# Patient Record
Sex: Female | Born: 1955 | Race: Black or African American | Hispanic: No | Marital: Married | Smoking: Former smoker
Health system: Southern US, Community
[De-identification: ages and names within clinical notes are randomized; demographics above are authoritative.]

## PROBLEM LIST (undated history)

## (undated) DIAGNOSIS — E039 Hypothyroidism, unspecified: Secondary | ICD-10-CM

## (undated) DIAGNOSIS — I495 Sick sinus syndrome: Secondary | ICD-10-CM

## (undated) DIAGNOSIS — D649 Anemia, unspecified: Secondary | ICD-10-CM

## (undated) DIAGNOSIS — I00 Rheumatic fever without heart involvement: Secondary | ICD-10-CM

## (undated) DIAGNOSIS — E785 Hyperlipidemia, unspecified: Secondary | ICD-10-CM

## (undated) DIAGNOSIS — Z950 Presence of cardiac pacemaker: Secondary | ICD-10-CM

## (undated) DIAGNOSIS — H35039 Hypertensive retinopathy, unspecified eye: Secondary | ICD-10-CM

## (undated) DIAGNOSIS — I739 Peripheral vascular disease, unspecified: Secondary | ICD-10-CM

## (undated) DIAGNOSIS — A048 Other specified bacterial intestinal infections: Secondary | ICD-10-CM

## (undated) DIAGNOSIS — I341 Nonrheumatic mitral (valve) prolapse: Secondary | ICD-10-CM

## (undated) DIAGNOSIS — I10 Essential (primary) hypertension: Secondary | ICD-10-CM

## (undated) DIAGNOSIS — R0989 Other specified symptoms and signs involving the circulatory and respiratory systems: Secondary | ICD-10-CM

## (undated) DIAGNOSIS — I509 Heart failure, unspecified: Secondary | ICD-10-CM

## (undated) DIAGNOSIS — H269 Unspecified cataract: Secondary | ICD-10-CM

## (undated) DIAGNOSIS — R200 Anesthesia of skin: Secondary | ICD-10-CM

## (undated) DIAGNOSIS — I773 Arterial fibromuscular dysplasia: Secondary | ICD-10-CM

## (undated) DIAGNOSIS — K219 Gastro-esophageal reflux disease without esophagitis: Secondary | ICD-10-CM

## (undated) DIAGNOSIS — M797 Fibromyalgia: Secondary | ICD-10-CM

## (undated) DIAGNOSIS — R7302 Impaired glucose tolerance (oral): Secondary | ICD-10-CM

## (undated) HISTORY — DX: Peripheral vascular disease, unspecified: I73.9

## (undated) HISTORY — DX: Impaired glucose tolerance (oral): R73.02

## (undated) HISTORY — DX: Essential (primary) hypertension: I10

## (undated) HISTORY — DX: Nonrheumatic mitral (valve) prolapse: I34.1

## (undated) HISTORY — PX: CARDIAC CATHETERIZATION: SHX172

## (undated) HISTORY — DX: Fibromyalgia: M79.7

## (undated) HISTORY — PX: OTHER SURGICAL HISTORY: SHX169

## (undated) HISTORY — DX: Hyperlipidemia, unspecified: E78.5

## (undated) HISTORY — DX: Heart failure, unspecified: I50.9

## (undated) HISTORY — DX: Sick sinus syndrome: I49.5

## (undated) HISTORY — PX: APPENDECTOMY: SHX54

## (undated) HISTORY — DX: Anesthesia of skin: R20.0

## (undated) HISTORY — PX: CHOLECYSTECTOMY: SHX55

## (undated) HISTORY — DX: Other specified symptoms and signs involving the circulatory and respiratory systems: R09.89

## (undated) HISTORY — DX: Other specified bacterial intestinal infections: A04.8

## (undated) HISTORY — DX: Gastro-esophageal reflux disease without esophagitis: K21.9

## (undated) HISTORY — DX: Rheumatic fever without heart involvement: I00

## (undated) HISTORY — PX: TONSILLECTOMY: SUR1361

## (undated) HISTORY — DX: Unspecified cataract: H26.9

## (undated) HISTORY — PX: ABDOMINAL HYSTERECTOMY: SHX81

## (undated) HISTORY — DX: Arterial fibromuscular dysplasia: I77.3

## (undated) HISTORY — DX: Hypertensive retinopathy, unspecified eye: H35.039

## (undated) HISTORY — DX: Anemia, unspecified: D64.9

## (undated) NOTE — *Deleted (*Deleted)
[facility] - Clinic Note  04/20/2020     CHIEF COMPLAINT Patient presents for No chief complaint on file.   HISTORY OF PRESENT ILLNESS: Sylvia Anthony is a 80 y.o. female who presents to the clinic today for:   pt is using brimonidine and Cosopt TID OU, no new health concerns  Referring physician: Ignatius Specking, MD [address] [place],  [place] [address]  HISTORICAL INFORMATION:   Selected notes from the MEDICAL RECORD NUMBER Referred by Dr. Earlene Plater for concern of bilateral vitreitis.   CURRENT MEDICATIONS: Current Outpatient Medications (Ophthalmic Drugs)  Medication Sig  . brimonidine (ALPHAGAN) 0.2 % ophthalmic solution Place 1 drop into both eyes 3 (three) times daily. (Patient not taking: Reported on 03/16/2020)  . Bromfenac Sodium (PROLENSA) 0.07 % SOLN Place 1 drop into both eyes 4 (four) times daily.   . dorzolamide-timolol (COSOPT) 22.3-6.8 MG/ML ophthalmic solution Place 1 drop into both eyes 3 (three) times daily.  . prednisoLONE acetate (PRED FORTE) 1 % ophthalmic suspension Place 1 drop into both eyes 4 (four) times daily.  (Patient not taking: Reported on 03/16/2020)  . prednisoLONE acetate (PRED FORTE) 1 % ophthalmic suspension Place 1 drop into both eyes 4 (four) times daily. (Patient not taking: Reported on 03/16/2020)   No current facility-administered medications for this visit. (Ophthalmic Drugs)   Current Outpatient Medications (Other)  Medication Sig  . albuterol (PROVENTIL HFA;VENTOLIN HFA) 108 (90 BASE) MCG/ACT inhaler Inhale 2 puffs into the lungs every 6 (six) hours as needed for wheezing or shortness of breath.  Marland Kitchen amLODipine (NORVASC) 10 MG tablet Take 10 mg by mouth daily.  Marland Kitchen aspirin EC 81 MG tablet Take 1 tablet (81 mg total) by mouth daily.  . cetirizine (ZYRTEC) 10 MG tablet Take 10 mg by mouth at bedtime as needed for allergies.   Marland Kitchen diclofenac (VOLTAREN) 75 MG EC tablet Take 75 mg by mouth daily as needed for mild pain.  Marland Kitchen  levothyroxine (SYNTHROID, LEVOTHROID) 112 MCG tablet Take 112 mcg by mouth daily before breakfast.   . metoprolol succinate (TOPROL-XL) 50 MG 24 hr tablet Take 50 mg by mouth daily.  . nitroGLYCERIN (NITROSTAT) 0.4 MG SL tablet Place 1 tablet (0.4 mg total) under the tongue every 5 (five) minutes as needed for chest pain.  . pantoprazole (PROTONIX) 40 MG tablet Take 1 tablet (40 mg total) by mouth 2 (two) times daily before a meal.  . potassium chloride SA (K-DUR,KLOR-CON) 20 MEQ tablet Take 20 mEq by mouth daily.   . rosuvastatin (CRESTOR) 20 MG tablet Take 20 mg by mouth daily.  . valsartan-hydrochlorothiazide (DIOVAN-HCT) 320-25 MG per tablet Take 1 tablet by mouth daily.     No current facility-administered medications for this visit. (Other)      REVIEW OF SYSTEMS:    ALLERGIES Allergies  Allergen Reactions  . Gabapentin     SWELLING "OUT OF IT" 12/23/16    PAST MEDICAL HISTORY Past Medical History:  Diagnosis Date  . Anemia   . Arm numbness   . Arterial fibromuscular dysplasia (HCC)   . Carotid bruit   . Cataract    Mixed form OU  . CHF (congestive heart failure) (HCC) 1997   pacemaker  . Fibromyalgia   . GERD (gastroesophageal reflux disease)   . Glucose intolerance (impaired glucose tolerance)   . H. pylori infection Sept 2013   Amoxicillin, Biaxin  . Hyperlipidemia   . Hypertension   . Hypertensive retinopathy  OU  . Hypothyroidism   . Mitral valve prolapse   . Peripheral vascular disease (HCC)   . Rheumatic fever    age 71, was at [facility] for 17 weeks  . SSS (sick sinus syndrome) [facility])    Past Surgical History:  Procedure Laterality Date  . ABDOMINAL HYSTERECTOMY     partial  . APPENDECTOMY    . CARDIAC CATHETERIZATION     multiple  . CHOLECYSTECTOMY    . COLONOSCOPY  08/01/2006   3 mm descending colon polyp removed/8 mm sessile ascending colon polyp removed / 3-mm rectal  polyp removed /Rare sigmoid diverticulosis/ Moderate internal  hemorrhoids.Advanced adenoma on colonoscopy in March 2008.  The polyp was anadenomatous polyp with a foci of high-grade dysplasia  . COLONOSCOPY  09/08/2008   SIMPLE ADENOMA/HYPERPLASTIC POLY/Multiple colon polyps (ascending, sigmoid, rectal)  Mild sigmoid colon diverticulosis./ Small internal hemorrhoids  . COLONOSCOPY N/A 12/11/2017   Dr. [name]: diverticulosis, hemorrhoids next surveillance tcs 5 years.   . COLONOSCOPY WITH ESOPHAGOGASTRODUODENOSCOPY (EGD)  Sept. 30, 2013   ZOX:WRUE diverticulosis was noted in the sigmoid colon/The colon was otherwise normal/Small internal hemorrhoids/EGD:The mucosa of the esophagus appeared normal/Non-erosive gastritis (inflammation) was found; multiple bx/The duodenal mucosa showed no abnormalities. +H.pylori gastritis, treated with equivalent of prevpac. SAVARY DILATION  . ESOPHAGEAL DILATION N/A 10/07/2014   Procedure: ESOPHAGEAL DILATION;  Surgeon: West Bali, MD;  Location: [facility] ENDO SUITE;  Service: Endoscopy;  Laterality: N/A;  . ESOPHAGOGASTRODUODENOSCOPY N/A 10/07/2014   Dr. [name]: moderate non-erosive gastritis, no definite stricture, empiric dilation . negative H.pylori   . growth removed from intestine     [facility]-as teenager, done through colonoscopy  . PACEMAKER GENERATOR CHANGE  09/18/2006   generator change by Dr Amil Amen with a MDT Adapta L  . PACEMAKER INSERTION  09/13/1995   for sick sinus syndome and syncope at [facility]  . PPM GENERATOR CHANGEOUT N/A 06/11/2018    Medtronic Azure XT DR MRI SureScan model [id number] (serial number [id number]) pacemaker by Dr Johney Frame  . right Achilles tendon     X 2  . TONSILLECTOMY      FAMILY HISTORY Family History  Problem Relation Age of Onset  . Hyperlipidemia Mother   . Hypertension Mother   . Heart disease Father   . Hypertension Father   . Hyperlipidemia Sister   . Hypertension Sister   . Colon polyps Sister   . Hyperlipidemia Brother   . Hypertension Brother   . Colon polyps Brother    . Colon cancer Neg Hx     SOCIAL HISTORY Social History   Tobacco Use  . Smoking status: Former Smoker    Packs/day: 0.25    Years: 4.00    Pack years: 1.00    Quit date: 10/09/1985    Years since quitting: 34.5  . Smokeless tobacco: Never Used  . Tobacco comment: just in teens  Substance Use Topics  . Alcohol use: No    Alcohol/week: 0.0 standard drinks  . 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 Cystoid macular edema of both eyes  H35.353   3. Retinal edema  H35.81   4. 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 Essential hypertension  I10  6. Hypertensive retinopathy of both eyes  H35.033   7. Combined forms of age-related cataract of both eyes  H25.813   8. Nodule of right lung  R91.1   9. 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 Mild Panuveitis w/ CME OU -- improved / stably resolved tapered off drops  - pt reports 1+ mo history of floaters and decreased vision OU; +photophobia  - saw Dr. Earlene Plater, who noted [facility] cell and started PF q1h  - initial exam here showed +cell/pigment in [facility] and vitreous cavity; mild vitreous haze and +central CME OU  - FA (02.15.21) showed central petaloid hyperfluorescence and hyperfluorescence of the optic disc  - s/p STK OD (05.25.21)  - pt reports history of fibromyalgia, family history of lupus  - initiated uveitis lab work up -- all WNL except +toxo IgG   CBC, CMP   RPR, VDRL, FTA-Abs, MHA   HIV, Lyme, Quant-Gold   Toxoplasma titers   HLA Panel   ANA   ANCA   ACE, Lysozyme   RF   ESR, CRP   CXR  - chest x-ray without TB or pulmonary sarcoidosis, but did reveal a 1.6cm nodular mass in right lung base  - hx of Covid-19 infection ?involvement  - repeat FA (08.23.21) shows interval improvement in perifoveal petaloid leakage OU  - OCT shows OD: stable improvement of  IRF/CME; OS: Stable resolution of CME OS  - discussed findings  - IOP improved today to 22 -- on Cosopt and Brim (likely steroid response)  - completed PF and Prolensa taper   - cont Cosopt BID OU  - brimonidine TID OU  - STK informed consent obtained, signed and scanned, 05.25.21  - see procedure note  - f/u 4-6 weeks -- DFE/OCT, IOP check  5,6. Hypertensive retinopathy OU  - discussed importance of tight BP control  - monitor  7. Mixed form age related cataract OU  - The symptoms of cataract, surgical options, and treatments and risks were discussed with patient.  - discussed diagnosis and progression  - not yet visually significant  - monitor for now  8. 1.6 cm lung nodule of R lung base found on CXR  - discussed with Dr. Sherril Croon as above  - PET imaging done on 3.11.21 -- identifying some metabolic lesions (see report below)  - CT chest performed 4.14.21 -- indicating mild dec in some dimensions (see report below)  - pt saw Dr. Delton Coombes who had originally scheduled a bronchoscopy, but was cancelled following the CT evidence of decreasing size on CT  9. Ocular Hypertension OU  - IOP improved today  - likely steroid response  - reviewed instructions: Cosopt BID OU and brimonidine TID OU   Ophthalmic Meds Ordered this visit:  No orders of the defined types were placed in this encounter.  This document serves as a record of services personally performed by Karie Chimera, MD, PhD. It was created on their behalf by Herby Abraham, COA, an ophthalmic technician. The creation of this record is the provider's dictation and/or activities during the visit.    Electronically signed by: Herby Abraham, COA @TODAY @ 10:10 AM   Abbreviations: M myopia (nearsighted); A astigmatism; H hyperopia (farsighted); P presbyopia; Mrx spectacle prescription;  CTL contact lenses; OD right eye; OS left eye; OU both eyes  XT exotropia; ET esotropia; PEK punctate epithelial keratitis; PEE punctate  epithelial erosions; DES dry eye syndrome; MGD meibomian gland dysfunction; ATs artificial tears; PFAT's preservative free artificial tears;  NSC nuclear sclerotic cataract; PSC posterior subcapsular cataract; ERM epi-retinal membrane; PVD posterior vitreous detachment; RD retinal detachment; DM diabetes mellitus; DR diabetic retinopathy; NPDR non-proliferative diabetic retinopathy; PDR proliferative diabetic retinopathy; CSME clinically significant macular edema; DME diabetic macular edema; dbh dot blot hemorrhages; CWS cotton wool spot; POAG primary open angle glaucoma; C/D cup-to-disc ratio; HVF humphrey visual field; GVF goldmann visual field; OCT optical coherence tomography; IOP intraocular pressure; BRVO Branch retinal vein occlusion; CRVO central retinal vein occlusion; CRAO central retinal artery occlusion; BRAO branch retinal artery occlusion; RT retinal tear; SB scleral buckle; PPV pars plana vitrectomy; VH Vitreous hemorrhage; PRP panretinal laser photocoagulation; IVK intravitreal kenalog; VMT vitreomacular traction; MH Macular hole;  NVD neovascularization of the disc; NVE neovascularization elsewhere; AREDS age related eye disease study; ARMD age related macular degeneration; POAG primary open angle glaucoma; EBMD epithelial/anterior basement membrane dystrophy; ACIOL anterior chamber intraocular lens; IOL intraocular lens; PCIOL posterior chamber intraocular lens; Phaco/IOL phacoemulsification with intraocular lens placement; PRK photorefractive keratectomy; LASIK laser assisted in situ keratomileusis; HTN hypertension; DM diabetes mellitus; COPD chronic obstructive pulmonary disease   IMAGING:  NM PET Image (3.11.21)  FINDINGS: Mediastinal blood pool activity: SUV max 2.5  Liver activity: SUV max 3.5  NECK: No hypermetabolic lymph nodes in the neck.  Incidental CT findings: none  CHEST: Within the RIGHT lower lobe, tight cluster of nodules measures 1.4 x 1.3 cm (image 77/4) has  mild to moderate metabolic activity for size (SUV max equal 2.9).  There is hypermetabolic activity associated the subcarinal lymph node which is normal size at 9 mm but fairly intense metabolic activity SUV max equal 5.0. More mild activity associated with a partially calcified small RIGHT hilar node with SUV max equal 3.8.  Incidental CT findings: none  ABDOMEN/PELVIS: No abnormal hypermetabolic activity within the liver, pancreas, adrenal glands, or spleen. No hypermetabolic lymph nodes in the abdomen or pelvis.  Incidental CT findings: none  SKELETON: No focal hypermetabolic activity to suggest skeletal metastasis.  Incidental CT findings: none  IMPRESSION: 1. Mild-to-moderate metabolic activity associated with irregular RIGHT lobe pulmonary nodule. Hypermetabolic subcarinal lymph node. Findings remain indeterminate for inflammatory nodule and lymphadenopathy versus malignancy. There is evidence of granulomatous disease with calcified RIGHT hilar lymph node. Recommend either short-term CT follow-up (3 months) or tissue sampling. 2. No distant metastatic disease.  CT Chest w/o contrast (4.13.21)  FINDINGS: Cardiovascular: Right-sided dual lead pacer device in place, power pack over the left chest. Leads appear in similar position to the prior exam. Heart size mildly enlarged. No pericardial effusion. The calcified atherosclerotic changes of the thoracic aorta without aneurysmal dilation. Main pulmonary artery is mildly dilated.  Mediastinum/Nodes: Stable mildly enlarged subcarinal and right paratracheal lymph nodes just at or less than a cm. No hilar adenopathy. No axillary adenopathy. Thoracic inlet structures are normal.  Lungs/Pleura: Irregular area in the right lower lobe anteriorly, in total measuring approximately 1.7 cm by 1.2 cm. Grouped nodules at the periphery. The dominant area measuring approximately 1.3 cm greatest dimension with extension to  the pleural surface along the right lateral chest. The lesion abuts the major fissure. On the sagittal and coronal images the lesion appears to have decreased in size compared to the prior study, dominant area measuring 6-7 mm greatest thickness on sagittal image 44 of series 6 as compared to 9-10 mm on the previous study, image 36 of series 7 on the 07/22/2018 exam.  Axial measurements are provided across the greatest extent of the lesion which appears to  be less dense in the axial plane within the central portion of the measured area across grouped nodules at the periphery and the dominant nodule more centrally.  On coronal image 78 of series 5 the lesion measures 1.2 cm greatest dimension, previously approximately 1.2 cm with respect to the largest area but measured 0.9 cm greatest thickness.  Airways are patent. No consolidation or pleural effusion.  Upper Abdomen: Incidentally imaged upper abdominal contents with changes of cholecystectomy. Upper abdominal contents are otherwise unremarkable.  Musculoskeletal: No chest wall lesion. No acute bone process. No destructive bone finding.  IMPRESSION: 1. Irregular area in the right lower lobe with dominant nodule in small group peripheral nodules raises the question of infectious etiology given the slight interval decrease in some dimensions compared to the prior study potential short interval follow-up could be helpful to track for interval decrease. Neoplasm at this point not entirely excluded. 2. Stable mildly enlarged subcarinal and right paratracheal lymph nodes. Below threshold in terms of nodal enlargement for CT. 3. Aortic atherosclerosis.

---

## 1995-05-31 DIAGNOSIS — I509 Heart failure, unspecified: Secondary | ICD-10-CM

## 1995-05-31 HISTORY — DX: Heart failure, unspecified: I50.9

## 1995-09-13 HISTORY — PX: PACEMAKER INSERTION: SHX728

## 2001-05-01 ENCOUNTER — Inpatient Hospital Stay (HOSPITAL_COMMUNITY): Admission: AD | Admit: 2001-05-01 | Discharge: 2001-05-03 | Payer: Self-pay | Admitting: Interventional Cardiology

## 2006-08-01 ENCOUNTER — Encounter (INDEPENDENT_AMBULATORY_CARE_PROVIDER_SITE_OTHER): Payer: Self-pay | Admitting: Specialist

## 2006-08-01 ENCOUNTER — Ambulatory Visit (HOSPITAL_COMMUNITY): Admission: RE | Admit: 2006-08-01 | Discharge: 2006-08-01 | Payer: Self-pay | Admitting: Gastroenterology

## 2006-08-01 ENCOUNTER — Ambulatory Visit: Payer: Self-pay | Admitting: Gastroenterology

## 2006-08-01 HISTORY — PX: COLONOSCOPY: SHX174

## 2006-09-18 ENCOUNTER — Ambulatory Visit (HOSPITAL_COMMUNITY): Admission: RE | Admit: 2006-09-18 | Discharge: 2006-09-18 | Payer: Self-pay | Admitting: Cardiology

## 2006-09-18 HISTORY — PX: PACEMAKER GENERATOR CHANGE: SHX5998

## 2006-12-28 ENCOUNTER — Ambulatory Visit (HOSPITAL_COMMUNITY): Admission: RE | Admit: 2006-12-28 | Discharge: 2006-12-28 | Payer: Self-pay | Admitting: Cardiology

## 2008-07-22 ENCOUNTER — Ambulatory Visit: Payer: Self-pay | Admitting: Gastroenterology

## 2008-08-19 ENCOUNTER — Telehealth: Payer: Self-pay | Admitting: Gastroenterology

## 2008-08-21 ENCOUNTER — Encounter: Payer: Self-pay | Admitting: Gastroenterology

## 2008-09-08 ENCOUNTER — Ambulatory Visit (HOSPITAL_COMMUNITY): Admission: RE | Admit: 2008-09-08 | Discharge: 2008-09-08 | Payer: Self-pay | Admitting: Gastroenterology

## 2008-09-08 ENCOUNTER — Ambulatory Visit: Payer: Self-pay | Admitting: Gastroenterology

## 2008-09-08 ENCOUNTER — Encounter: Payer: Self-pay | Admitting: Gastroenterology

## 2008-09-08 HISTORY — PX: COLONOSCOPY: SHX174

## 2008-09-10 ENCOUNTER — Encounter (INDEPENDENT_AMBULATORY_CARE_PROVIDER_SITE_OTHER): Payer: Self-pay

## 2008-09-12 ENCOUNTER — Telehealth (INDEPENDENT_AMBULATORY_CARE_PROVIDER_SITE_OTHER): Payer: Self-pay

## 2010-10-12 NOTE — Op Note (Signed)
[name], [name] NO.:  [id number]   MEDICAL RECORD NO.:  [id number]          PATIENT TYPE:  OIB   LOCATION:  2899                         FACILITY:  [facility]   PHYSICIAN:  Francisca December, M.D.  DATE OF BIRTH:  06/19/1955   DATE OF PROCEDURE:  12/28/2006  DATE OF DISCHARGE:  12/28/2006                               OPERATIVE REPORT   PROCEDURE PERFORMED:  Exploration of pacemaker pocket.   INDICATION:  Sylvia Anthony is a 55 year old woman who is now 3-1/2  months S/P pacemaker battery change.  Over the last two weeks, she has  developed a painful lateral edge of her incision which had healed  nicely.  A 1 cm x 1/2 cm region of a very tender tissue that is slightly  raised but not erythematous or swollen is seen at the lateral edge of  the wound.  There is a concern for late pacemaker pocket infection or  perhaps a stitch seroma.  She is brought to the catheterization  laboratory for an exploration at this time.   PROCEDURE NOTE:  The patient was brought to cardiac catheterization  laboratory in a fasting state.  The left prepectoral region was prepped  and draped in the usual sterile fashion.  Local anesthesia was obtained  with the infiltration of 1% lidocaine with epinephrine.  The previously  healed pacemaker wound was incised and explored down to the level of the  pacemaker capsule.  The capsule itself was not opened.  Anaerobic and  aerobic cultures were obtained.  There was no apparent seroma or  purulence seen.  The tissue was healing normally.  The wound was then  closed using 2-0 Vicryl for the subcutaneous layer in a running fashion.  The skin was approximated using 4-0 Vicryl in a running subcuticular  fashion.  Steri-Strips and a sterile dressing were applied.  The patient  was transported to the recovery area in stable condition.   POSTOPERATIVE DIAGNOSIS:  Normal wound healing.  Perhaps, keloid  formation causing tenderness.  No clear  evidence of infection.   PLAN:  The patient will be discharged.  Await culture results.      Francisca December, M.D.  Electronically Signed     JHE/MEDQ  D:  12/28/2006  T:  12/29/2006  Job:  [id number]

## 2010-10-12 NOTE — Assessment & Plan Note (Signed)
NAMEMarland Anthony  ZITLALY, MALSON            CHART#:  [id number]   DATE:  07/22/2008                       DOB:  11/01/1955   PRIMARY PHYSICIAN:  Dorise Hiss, MD   PROBLEM LIST:  1. Advanced adenoma on colonoscopy in March 2008.  The polyp was an      adenomatous polyp with a foci of high-grade dysplasia.  2. Hypertension.  3. Hypothyroidism.  4. Hyperlipidemia.  5. Gastroesophageal reflux disease.  6. Mitral valve prolapse/bradycardia requiring pacer placement.  7. Fibromyalgia.  8. Hysterectomy.  9. Cholecystectomy at [facility] (no stones).  10.Appendectomy.   SUBJECTIVE:  The patient is a 55 year old female who was last seen in  March 2008 for her colonoscopy.  She reports dark bowel movements.  She  denies that there were black.  She has not changed her diet or had any  Pepto-Bismol.  She is taking the same vitamins.  She is taking aspirin a  day.  She denies that the use of BCs, Goody powders, ibuprofen, Motrin,  or Aleve.  She is having 1 regular hard stool a day, even when she had  the dark stool.  She eats turnip greens, it usually turns her stools  green.  She denies any abdominal pain, weight loss, nausea, or vomiting.  The last time she saw dark stools is 2 few weeks ago.  She denies any  fever or chills.  She is have no problems swallowing.  She does have  some burping.  Over-the-counter acid reducer helps the heartburn.  Protonix may help as well.  Her appetite is good.  She is not following  a low-fat diet.   FAMILY HISTORY:  Her brother has polyps.  She has no family history of  colon cancer.   SOCIAL HISTORY:  She denies tobacco or alcohol use.   OBJECTIVE:  VITAL SIGNS:  Weight 191 pounds, height 5 feet 4 inches,  temperature 98, blood pressure 130/90, and pulse 60.GENERAL:  She is in  no apparent distress.  Alert and oriented x4.HEENT:  Atraumatic,  normocephalic.  Pupils equal and react to light.  Mouth, no oral  lesions.  Posterior pharynx  without erythema or exudate.  NECK:  Full range of motion.  No lymphadenopathy.LUNGS:  Clear to  auscultation bilaterally.CARDIOVASCULAR:  Regular rhythm.  No murmur.  Normal S1 and S2.  ABDOMEN:  Bowel sounds are present, soft, nontender, nondistended.  No  rebound or guarding.LUNGS:  Clear to auscultation  bilaterally.EXTREMITIES:  No cyanosis and trace edema.NEUROLOGIC:  She  has no focal neurologic deficits.   ASSESSMENT:  The patient is a 55 year old female with dark stools and  not consistent with upper gastrointestinal bleed.  She has a history of  high-grade dysplasia and a polyp.  Thank you for allowing me to see the  patient in consultation.  My recommendations follow.   RECOMMENDATIONS:  1. Screening colonoscopy in March 2010 due to advanced polyp.  2. She should follow a low-fat diet.  She is given a handout on low-      fat diet.  3. She should take Protonix 30 minutes before first meal.  4. She has a follow up to see me as needed.       Kassie Mends, M.D.  Electronically Signed     SM/MEDQ  D:  07/23/2008  T:  07/23/2008  Job:  [id number]   cc:   Dorise Hiss, M.D.

## 2010-10-12 NOTE — Op Note (Signed)
Sylvia Anthony, Sylvia Anthony           ACCOUNT NO.:  [id number]   MEDICAL RECORD NO.:  [id number]          PATIENT TYPE:  AMB   LOCATION:  DAY                           FACILITY:  [facility]   PHYSICIAN:  Kassie Mends, M.D.      DATE OF BIRTH:  1955-09-17   DATE OF PROCEDURE:  09/08/2008  DATE OF DISCHARGE:                               OPERATIVE REPORT   REFERRING PHYSICIAN:  Dorise Hiss, MD   PROCEDURE:  Colonoscopy with cold forceps and snare cautery polypectomy.   INDICATION FOR EXAMINATION:  [name] is a 55 year old female who  had a colonoscopy in 2008.  She had an 8-mm ascending colon polyp  removed according to the notes.  The pathology report and the nursing  notes say descending colon polyp was removed.  The polyp which was  removed had high-grade dysplasia, had an adenomatous polyp with a foci  of high-grade dysplasia.  She presents for continued surveillance.   FINDINGS:  1. A 4-6-mm sessile ascending colon polyp removed via cold forceps.      The polyp was in between the folds there.  2. A 6-mm sessile sigmoid colon polyp removed via snare cautery.  The      polyp was located between a fold.  A 4-mm sessile proximal rectal      polyp removed via snare cautery.  2-3-mm sessile rectal polyps      removed via cold forceps.  The rectal polyps and the ascending      colon polyps were placed in bottle #1.  The sigmoid colon polyp was      placed in bottle #2.  3. Frequent sigmoid colon diverticula.  Otherwise, no evidence of      masses, inflammatory changes, or AVMs.  4. Small internal hemorrhoids.   DIAGNOSES:  1. Multiple colon polyps (ascending, sigmoid, rectal).  2. Mild sigmoid colon diverticulosis.  3. Small internal hemorrhoids.   RECOMMENDATIONS:  1. She should follow a high-fiber diet.  She is given a handout on      high-fiber diet, polyps, diverticulosis, and hemorrhoids.  2. No aspirin, NSAIDs, or anticoagulation for 7 days.  3. Will call her with results  of her biopsies.  If she has evidence of      an advanced adenoma then recommend a screening colonoscopy in 1      year.  Otherwise, she may wait 3 years.  4. All first-degree relatives need a screening colonoscopy at age 46      and then every 5 years.   MEDICATIONS:  1. Demerol 75 mg IV.  2. Versed 3 mg IV.   PROCEDURE TECHNIQUE:  Physical exam was performed.  Informed consent was  obtained from the patient after explaining the benefits, risks, and  alternatives to the procedure.  The patient was connected to the monitor  and placed in left lateral position.  Continuous oxygen was provided by  nasal cannula.  IV medicine administered through an indwelling cannula.  After administration of sedation and rectal exam, the patient's rectum  was intubated.  The scope was advanced under direct visualization to the  cecum.  Scope was removed slowly by carefully examining the color,  texture, anatomy, and integrity of the mucosa on the way out.  The  patient was recovered in endoscopy and discharged home in satisfactory  condition.   PATH:  SIMPLE ADENOMA, HYPERPLASTIC POLYP, INFLAMMED POLYPOID TISSUE.  TCS IN 3 YEARS. HIGH FIBER DIET.      Kassie Mends, M.D.  Electronically Signed     SM/MEDQ  D:  09/08/2008  T:  09/08/2008  Job:  [id number]   cc:   Dorise Hiss, M.D.  Fax: [phone]

## 2010-10-15 NOTE — Cardiovascular Report (Signed)
[place]  Patient:    Sylvia Anthony, Sylvia Anthony Visit Number: [id number] MRN: [id number]          Service Type: MED Location: [phone] Attending Physician:  Lyn Records. Iii Dictated by:   Darci Needle, M.D. Proc. Date: 05/02/01 Admit Date:  05/01/2001   CC:         Dorise Hiss, M.D., [place], [place]   Cardiac Catheterization  INDICATIONS FOR PROCEDURE:  Recently performed Cardiolite study after an emergency admission to [facility] demonstrated possible anterior ischemia.  PROCEDURES PERFORMED: 1. Left heart catheterization. 2. Selective coronary angiography. 3. Left ventriculography.  DESCRIPTION OF PROCEDURE:  After informed consent, a 6-French sheath was inserted into the right femoral artery using the modified Seldinger technique. A 6-French A2 multipurpose catheter was used for hemodynamic recordings, hand-injection left ventriculography, and attempted left and right coronary angiography.  Ultimately, we needed a #4 left Judkins 6-French and a #4 Judkins right 6-French and a 6-French no-torque right catheter to complete the case.  Because of ostial right coronary spasm, 100 mcg of intracoronary nitroglycerin were administered before the final angiogram of the right coronary was performed with the no-torque catheter.  This did not demonstrate any evidence of ostial stenosis.  RESULTS:    I. HEMODYNAMIC DATA:          a. Aortic pressure:  133/82.          b. Left ventricular pressure:  140/19.   II. LEFT VENTRICULOGRAPHY:  The left ventricle is normal in size and in      function.  Contractility is normal.  Ejection fraction is 75%.  III. SELECTIVE CORONARY ANGIOGRAPHY:          a. Left main coronary:  The left main coronary artery contains             perhaps mild ostial narrowing of up to 20%.  There is probably             also a component of spasm.  No significant abnormalities of the             left  main noted.          b. Left anterior descending coronary:  This is a large vessel that             gives origin to a large first diagonal and a moderate sized second             diagonal.  The LAD is essentially normal.  There is mild plaque             noted in the proximal LAD.          c. Ramus branch:  A large bifurcating ramus branch arises from the             left main and is normal.          d. Circumflex artery:  The circumflex artery contains no significant             obstruction.  Three obtuse marginal branches arise from it.          e. Right coronary:  The right coronary artery is large and tortuous.             It gives origin to a PDA and left ventricular branch.  No             significant obstruction is noted.  CONCLUSIONS: 1. Essentially normal coronary arteries. 2. Normal left ventricular function. 3. Abnormal Cardiolite is a false positive study.  RECOMMENDATIONS:  Recover overnight and discharge in the a.m.  No evidence of coronary disease.  No further cardiac evaluation seems indicated. Dictated by:   Darci Needle, M.D. Attending Physician:  Lyn Records. Iii DD:  05/02/01 TD:  05/02/01 Job: [id number]

## 2010-10-15 NOTE — H&P (Signed)
[place]. [facility]  Patient:    Sylvia Anthony, Sylvia Anthony Visit Number: [id number] MRN: [id number]          Service Type: MED Location: [phone] Attending Physician:  Lyn Records. Iii Dictated by:   Anselm Lis, N.P. Admit Date:  05/01/2001   CC:         Dorise Hiss, M.D.  Jonelle Sidle, M.D. [facility], [facility]   History and Physical  PRIMARY CARE Daxon Kyne:  Dorise Hiss, M.D.  IMPRESSION: 1. Constellation of symptoms of diaphoresis, shortness of breath,    palpitations, and chest pain in this 55 year old female, setting of singing    in church choir.  Decreased blood pressure noted in [facility] Emergency    Room, 81/43.  Her labs are significant for a serum potassium of 3.1.  She    does have a history of prior cardiac catheterization in 1995 by Dr. Daisy Floro    revealing normal coronary arteries, mild mitral regurgitation.  She is    status post adenosine Cardiolite at [facility] April 29, 2001, revealing    abnormalities anteroapical and inferobasal area with normal ejection    fraction.  She did rule out for myocardial infarction by negative serial    cardiac enzymes (elevated CK but normal MB fraction, normal troponin I).    The patient has a permanent transvenous pacemaker in place from 1997    (secondary to symptomatic bradycardia) with subsequent Holter monitor    October 2002 for complaint of palpitations/dizziness revealing no    correlation of symptoms with any dysrhythmia.  Her electrocardiogram at    [facility] was without significant change from baseline.  Negative congestive    heart failure on chest x-ray, and she is currently pain-free. 2. Hypothyroidism, supplemented.  Her thyroid stimulating hormone is okay. 3. History of symptomatic bradycardia, for which she had a permanent    transvenous pacemaker placed in 1997 at [facility]    [facility]. 4. Hypokalemia when she  was admitted at [facility] April 29, 2001, of 3.1,    likely related to her hydrochlorothiazide.  Her potassium is currently    3.9 after supplementation.  PLAN:  Patient counseled to undergo and accepted plans for coronary angiography with possible percutaneous intervention if indicated and able. Risks, potential complications, benefits, and alternatives discussed in detail.  Sylvia Anthony has indicated questions and concerns have been addressed and is agreeable to proceed.  HISTORY OF PRESENT ILLNESS:  Sylvia Anthony is a pleasant 55 year old female who was transferred from [facility] May 01, 2001.  She was admitted at [facility] December 1 for complaint of chest discomfort.  She ruled out, and subsequent Cardiolite was suggestive of "abnormalities" anteroapical and inferobasal regoin with normal ejection fraction.  She was transferred here to [facility] in anticipation of cardiac catheterization by Dr. Katrinka Blazing.  Sylvia Anthony was status post a weekend bus trip to [facility] and came back late Saturday evening (April 28, 2001).  The next day she felt well and sang in the church choir that Sunday evening.  About 7:30 that evening felt hot, became diaphoretic, and noted palpitations and shortness of breath.  She developed left arm numbness and noted a sharp substernal chest pain.  She had a syncopal episode approximately five minutes.  When she came to, EMS was summoned and she was transported to [facility].  She noted anterior tightness along with her sharp pain.  The sharp  pain was mostly relieved after a combination of sublingual nitrates and morphine.  She eventually had resolution of her chest tightness the next day by midafternoon, December 2. Her blood pressure upon her initial presentation to the emergency room was 81/43.  Her serum potassium was decreased at 3.4, which was supplemented.  She was treated with IV fluids for hypotension.  She had elevated  serial CK but negative MB fraction and negative troponin I.  A subsequent adenosine Cardiolite revealed anteroapical and inferobasal abnormalities with normal ejection fraction.  She was transferred to [facility] for further evaluation in anticipation of cardiac catheterization.  PAST MEDICAL HISTORY: 1. History of chest pain.    a. 1995, cardiac catheterization by Dr. Daisy Floro revealing no significant       CAD, normal LV function, 1-2+ MR.    b. April 29, 2001, adenosine Cardiolite at [facility]       revealing anteroapical and inferobasal abnormalities, significant for a       normal EF. 2. In 1997, DDDR pacemaker at [facility] for symptomatic    bradycardia from sinus node dysfunction.  She was followed in our clinic.    Pacemaker placed by Dr. Florestine Avers. 3. History of palpitations/dizziness, for which she had Holter monitor    October 2002 revealing no correlation of complaints with any dysrhythmias. 4. History of mild MR; she did have rheumatic fever as a child. 5. History of hypothyroidism resulting from radioactive iodine ablation for    treatment of Graves disease.  She is on Synthroid. 6. Hypertension. 7. GERD. 8. Migraine headaches, for which she takes Imitrex p.r.n. 9. History of fibromyalgia, for which she takes Vicodin p.r.n.  PAST SURGICAL HISTORY: 1. Cholecystectomy. 2. Hysterectomy without BSO. 3. Removal of intestinal growth. 4. Appendectomy. 5. Tonsillectomy. 6. Right heel operation. 7. Pacemaker.  ALLERGIES:  No known drug allergies.  No problems with seafood, known dye any products.  MEDICATIONS: 1. Synthroid 0.05 mg p.o. q.d. 2. Hydrochlorothiazide _____ per day. 3. Enteric-coated aspirin 325 mg per day. 4. Imitrex p.r.n.  SOCIAL HISTORY AND HABITS:  She is on a disability for the last six years. She is married.  She has a son, age 59, who is a Engineer, site.  FAMILY HISTORY:  Father died at age 81 of  a heart attack and stroke.  Mother  is age 38, alive and well.  She has four sisters, one who suffers from seizures.  One of her sisters had a stroke at age 76, another had a stroke at age 10 and is residing in a nursing home.  Two brothers, alive and well.  REVIEW OF SYSTEMS:  As in the HPI and previous medical history.  Otherwise, denies problems with symptoms of GERD.  Has complaints of constipation. Negative diarrhea, no BRBPR.  Negative melena.  Negative dysuria or hematuria. Generalized joint pain and swelling thought secondary to fibromyalgia.  Does have dependent pedal edema.  Denies orthopnea nor PND, though does have a chronic DOE with going down stairs.  PHYSICAL EXAMINATION:  VITAL SIGNS:  Blood pressure 136/63, heart rate 64 and regular, respiratory rate 20, O2 saturation 98% on room air.  GENERAL:  She is an overweight, pleasantly conversant middle-aged female in no apparent distress.  NECK:  Brisk bilateral carotid upstroke without bruit.  Neck has no JVD, no thyromegaly.  CHEST:  Lungs sounds clear except for some bibasilar crackles.  Negative CVA tenderness.  ABDOMEN:   Soft, nontender, normoactive bowel sounds.  Negative abdominal aortic, renal, or femoral bruit.  Nontender to applied pressure.  NO masses. No organomegaly.  EXTREMITIES:  Distal pulses intact.  Negative pedal edema.  GENITAL, RECTAL:  Deferred.  NEUROLOGIC:  Cranial nerves II-XII grossly intact.  Alert and oriented x 3.  LABORATORY DATA:  EKG from December 4 revealed A-paced appropriately, _____ T-wave abnormalities which are nonspecific, seem improved from initial EKG at [facility], but even those EKGs were no significant change from prior EKG March 08, 2001.  Labs from May 01, 2001, revealed WBC 7.2, hemoglobin 12.3, hematocrit 35.8, platelets of 198.  Protime 14.1, INR of 1.1. Sodium 139, potassium 3.9, chloride 110, CO2 30, BUN 7, creatinine  0.8, glucose 97, calcium 8.1.   Chest x-ray from [facility] revealed normal heart size, no infiltrates, no edema.  TSH was okay at 1.34.  Serial cardiac enzymes at [facility] revealed first CK of 445, MB fraction of 0.9, troponin I of 0.01.  Second CK of 308 with MB fraction 1.2.  Third CK 295, MB fraction 1.3, troponin I 0.02.  Cholesterol of 181, HDL 43, LDL of 123. Dictated by:   Anselm Lis, N.P. Attending Physician:  Lyn Records. Iii DD:  05/02/01 TD:  05/02/01 Job: [id number]

## 2010-10-15 NOTE — Discharge Summary (Signed)
[place]. [facility]  Patient:    Sylvia Anthony, Sylvia Anthony Visit Number: [id number] MRN: [id number]          Service Type: MED Location: [phone] Attending Physician:  Lyn Records. Iii Dictated by:   Anselm Lis, N.P. Admit Date:  05/01/2001 Discharge Date: 05/03/2001   CC:         Dorise Hiss, M.D., [place], [place]   Discharge Summary  DATE OF BIRTH:  May 31, 1955  PRIMARY CARE Enrico Eaddy:  Dorise Hiss, M.D., in [place].  PROCEDURES: 1. (April 29, 2001) [facility] had adenosine-Cardiolite    which was suspicious for anterior apical and inferobasal abnormalities with    normal ejection fraction. 2. (May 02, 2001) Coronary angiography and LV gram revealing essentially    normal coronary arteries for age and normal LV function.  DISCHARGE DIAGNOSES: 1. Nausea, diaphoresis, near-syncope question secondary to neurally mediated    syncope/hypotension syndrome. 2. Chest pain with positive (abnormal) Cardiolite.  She ruled out for    myocardial infarction.  Cardiac catheterization was negative for evidence    of coronary artery disease.    a. Abnormal Cardiolite is false-positive.    b. Chest pain probably related to neurally mediated hypotension syndrome. 3. Sick sinus syndrome, prior permanent transvenous pacemaker placed in 1997    at [facility]. 4. Hypokalemia likely secondary to hydrochlorothiazide.  Serum potassium was    3.1 on admission.  This was supplemented.  Subsequent followup potassium    was 3.9.  She was discharged on K-Dur 10 mEq p.o. q.d.  PLAN: 1. The patient discharged home in stable condition. 2. Same medications as prior to admission with the addition of K-Dur for    hypokalemia. 3. Education concerning neurally mediated hypotension syndrome. 4. Discharge medications:    a. Synthroid 0.05 once daily.    b. Hydrochlorothiazide dose as before.    c. Enteric-coated aspirin  325 mg once daily.    d. Imitrex p.r.n. migraines as before.    e. (NEW) K-Dur 10 mEq p.o. q.d. 5. Activity: No heavy lifting or pushing today or tomorrow then as before. 6. Diet: Low salt. 7. Wound care: May shower. 8. Special instructions: The patient is to call our clinic [phone]) if she    develops a large amount of swelling or bruising in the groin area. 9. Followup: Dr. Lucianne Lei as previously scheduled.  HISTORY OF PRESENT ILLNESS:  The patient was a pleasant 55 year old female who presented to [facility] on April 29, 2001 after experiencing symptoms of diaphoresis, shortness of breath, palpitations and chest discomfort after singing in church choir.  She ruled out for MI by negative serial cardiac enzymes in [facility].  Subsequent Adenosine-Cardiolite there was positive for anterior apical and inferobasal abnormalities.  She was transferred to [facility] on May 01, 2001.  The next day she underwent coronary angiography which revealed normal LV function.  Left main had 20% ostial stenosis, question spasm.  LAD with 20% proximal plaque.  The circumflex and RCA were normal.  She recovered well and was discharged home the next day in stable condition.  PREVIOUS MEDICAL HISTORY: 1. History of chest pain:    a. (1995) Cardiac catheterization by Dr. Daisy Floro revealing no significant       CAD, normal LV function, 1-2+ MR.    b. (1997) DDR pacemaker [facility] for symptomatic       bradycardia from sinus node  dysfunction.  She is followed in our clinic.       Her pacemaker was placed by Dr. Florestine Avers. 2. History of palpitations/dizziness for which she had Holter monitor    evaluation October 2002 revealing no correlation of complaints with any    dysrhythmias. 3. History of mild MR; she did have rheumatic fever as a child. 4. History of hypothyroidism resulting from radioactive iodine ablation for    treatment of Graves  disease.  She is on Synthroid. 5. Hypertension. 6. GERD. 7. Migraine headaches for which she takes Imitrex p.r.n. 8. History of fibromyalgia for which she takes Vicodin p.r.n.  PREVIOUS SURGICAL HISTORY:  Cholecystectomy, hysterectomy without BSO, removal of intestinal growth, appendectomy, tonsillectomy, right heel operation, and pacemaker.  LABORATORY TESTS AND DATA:  EKG reveals a paced rhythm.  T wave abnormalities are nonspecific about the same as those compared to EKG October 2002.  WBC 7.2, hemoglobin 12.3, hematocrit of 35.8, platelets of 198.  Prothrombin time 14.1, INR of 1.1.  Sodium 139, potassium on admission was 3.1; supplemented.  Followup potassium 3.9.  Chloride of 110, CO2 of 30, BUN of 7, creatinine 0.8, glucose 97, calcium 8.1.  Chest x-ray from [facility] revealed normal heart size, no infiltrates nor edema.  TSH okay at 1.34.  Serial cardiac enzymes at [facility] revealed first CK of 445, MB fraction 0.9, troponin I 0.01; second CK 308, MB fraction 1.2; third CK of 295, MB fraction 1.3, troponin I of 0.02.  Cholesterol of 181, HDL of 43, LDL of 123.  Total time preparing this discharge greater than 30 minutes, including dictating this discharge summary, filling out and reviewing discharge instructions with the patient and filling out scripts. Dictated by:   Anselm Lis, N.P. Attending Physician:  Lyn Records. Iii DD:  05/03/01 TD:  05/03/01 Job: [id number]

## 2010-10-15 NOTE — Op Note (Signed)
NAMEDEVONE, BONILLA NO.:  [id number]   MEDICAL RECORD NO.:  [id number]          PATIENT TYPE:  OIB   LOCATION:  2899                         FACILITY:  [facility]   PHYSICIAN:  Francisca December, M.D.  DATE OF BIRTH:  02-19-1956   DATE OF PROCEDURE:  09/18/2006  DATE OF DISCHARGE:  09/18/2006                               OPERATIVE REPORT   PROCEDURE PERFORMED:  1. Explant old pacing generator.  2. Replace new pacing generator, dual-chamber.   INDICATION:  A 55 year old woman who is now 11 years s/p PTVP for sick  sinus syndrome has reached elective replacement interval.  She is  brought to catheterization laboratory at this time to replace the  battery.   PROCEDURE NOTE:  The patient brought to cardiac catheterization  laboratory in fasting state.  The left prepectoral region was prepped  and draped in the usual sterile fashion.  Local anesthesia was obtained  infiltration of 1% lidocaine with epinephrine throughout the left  prepectoral region over the previous pacemaker insertion site.  A 6 to 7  cm incision was then made over the greater previous pacemaker scar and  this was carried down by sharp dissection to the pre pacemaker fascia.  There, capsule was incised and the pacemaker was subsequently delivered  without excessive difficulty.  The wires were then detached from the  pacing generator.  Each pacing wire was tested for adequate pacing  parameters and this is reported below.  The capsule and pacemaker site  was then copiously irrigated using 1% kanamycin solution.  The pacing  leads were then attached to the pacing generator carefully identifying  each by its serial number and placing each into the appropriate  receptacle.  Each lead was tightened into place and tested for security.  The pacemaker was then placed back within the pacemaker capsule.  The  wound was then closed using 2-0 Vicryl running fashion for subcutaneous  layer.  The skin was  approximated using 2-0 Vicryl running subcuticular  fashion.  Steri-Strips and sterile dressing were applied.  The patient  is transported to the recovery area in stable condition.   EQUIPMENT DATA:  The new pacing generator is a Medtronic Adapta model  number ADDRL1, serial number [id number].  The atrial lead detected a 2.1  mV P-wave.  The pacing threshold was 0.5 volts at 0.5 milliseconds pulse  width.  The impedance was 775 ohms resulting in a current at capture  threshold of 0.8 MA.  The ventricular lead detected a 25.6 mV R wave.  The pacing threshold was 0.6 volts of 0.5 milliseconds pulse width.  The  impedance was 936 ohms resulting in a current at capture threshold of  1.2 MA.      Francisca December, M.D.  Electronically Signed     JHE/MEDQ  D:  09/18/2006  T:  09/19/2006  Job:  [id number]

## 2010-10-15 NOTE — Op Note (Signed)
NAME:  Sylvia Anthony, AZIMI NO.:  [id number]   MEDICAL RECORD NO.:  [id number]          PATIENT TYPE:  AMB   LOCATION:  DAY                           FACILITY:  [facility]   PHYSICIAN:  Kassie Mends, M.D.      DATE OF BIRTH:  [birth year]   DATE OF PROCEDURE:  08/01/2006  DATE OF DISCHARGE:                               OPERATIVE REPORT   REFERRING PHYSICIAN:  Dorise Hiss, M.D.   PROCEDURE:  Colonoscopy with snare polypectomy and cold forceps  polypectomy.   INDICATION FOR EXAM:  [name] is a 55 year old female who presents  for average risk colon cancer screening.   FINDINGS:  1. 3 mm descending colon polyp removed via cold forceps.  8 mm sessile      ascending colon polyp removed via snare cautery.  A 3-mm rectal      polyp removed via cold forceps.  Rare sigmoid diverticulosis.      Moderate internal hemorrhoids.  Otherwise no masses, inflammatory      changes or arteriovenous malformations seen.   RECOMMENDATIONS:  1. Will call [name] with biopsy results.  If polyps are      adenomatous, then her siblings and children need a screening      colonoscopy at age 67 and then every 5 years.  2. High fiber diet.  A high-fiber diet is associated with decrease      risk colon cancer and polyp formation.  She is given a handout on      diverticulosis, polyps, high-fiber diet and hemorrhoids.  3. No aspirin, anti-inflammatory drugs or anticoagulation for 7 days.  4. Follow with Dr. Lucianne Lei.   MEDICATIONS:  1. Demerol 75 mg IV.  2. Versed 4 mg IV.   PROCEDURE TECHNIQUE:  Physical exam was performed and informed consent  was obtained from the patient after explaining the benefits, risks and  alternatives to the procedure.  The patient was connected to the monitor  placed in left lateral position.  Continuous oxygen was provided by  nasal cannula and IV medicine administered through an indwelling  cannula.  After administration of sedation and rectal exam,  the  patient's rectum was intubated.  The scope was advanced under direct visualization to the cecum.  The  scope was subsequently removed slowly by carefully examining the color,  texture, anatomy and integrity of the mucosa on the way out.  The  patient was recovered in endoscopy suite and discharged home in  satisfactory condition.      Kassie Mends, M.D.  Electronically Signed     SM/MEDQ  D:  08/01/2006  T:  08/01/2006  Job:  [id number]   cc:   Dorise Hiss, M.D.  Fax: [phone]

## 2011-03-14 LAB — ANAEROBIC CULTURE

## 2011-03-14 LAB — WOUND CULTURE: Gram Stain: NONE SEEN

## 2011-04-27 ENCOUNTER — Encounter: Payer: Self-pay | Admitting: Vascular Surgery

## 2011-04-29 ENCOUNTER — Other Ambulatory Visit: Payer: Self-pay

## 2011-04-29 DIAGNOSIS — I6529 Occlusion and stenosis of unspecified carotid artery: Secondary | ICD-10-CM

## 2011-05-17 ENCOUNTER — Encounter: Payer: Self-pay | Admitting: Vascular Surgery

## 2011-05-18 ENCOUNTER — Encounter: Payer: Self-pay | Admitting: Vascular Surgery

## 2011-05-18 ENCOUNTER — Ambulatory Visit (INDEPENDENT_AMBULATORY_CARE_PROVIDER_SITE_OTHER): Payer: Medicare Other | Admitting: Vascular Surgery

## 2011-05-18 ENCOUNTER — Other Ambulatory Visit (INDEPENDENT_AMBULATORY_CARE_PROVIDER_SITE_OTHER): Payer: Medicare Other | Admitting: *Deleted

## 2011-05-18 VITALS — BP 154/94 | HR 61 | Resp 18 | Ht 64.0 in | Wt 192.0 lb

## 2011-05-18 DIAGNOSIS — I6529 Occlusion and stenosis of unspecified carotid artery: Secondary | ICD-10-CM

## 2011-05-18 NOTE — Progress Notes (Signed)
[facility] [place]  Patient name: Sylvia Anthony MRN: [id number] DOB: 1955-07-15 Sex: female  CC: evaluate carotid artery stenosis. Referred by Dr.Vyas.  HPI: Sylvia Anthony is a 55 y.o. female who has had some repeated episodes of weakness and paresthesias in her left hand. She's also had some pain on the left side of her neck and shoulder. She does describe one episode where she had some expressive aphasia. This prompted a duplex scan that was done at an outlying facility which suggested a 50-69% left carotid stenosis with a less than 50% right carotid stenosis. For this reason she was sent for vascular consultation.  She denies any previous history of stroke, TIAs, expressive or receptive aphasia. His had no history of amaurosis fugax.  Past Medical History  Diagnosis Date  . Arm numbness   . Hypertension   . Pedal edema   . Neck pain   . Hyperlipidemia   . Thyroid disease   . Carotid bruit   . Anemia   . Fibromyalgia   . Arterial fibromuscular dysplasia   . Heart murmur   . Diabetes mellitus     border line  . Peripheral vascular disease   . CHF (congestive heart failure) 1997    pacemaker    Family History  Problem Relation Age of Onset  . Hyperlipidemia Mother   . Hypertension Mother   . Heart disease Father   . Hypertension Father   . Hyperlipidemia Sister   . Hypertension Sister   . Hyperlipidemia Brother   . Hypertension Brother    SOCIAL HISTORY: History  Substance Use Topics  . Smoking status: Never Smoker   . Smokeless tobacco: Not on file  . Alcohol Use: No   No Known Allergies  Current Outpatient Prescriptions  Medication Sig Dispense Refill  . calcium carbonate (TUMS - DOSED IN MG ELEMENTAL CALCIUM) 500 MG chewable tablet Chew 1 tablet by mouth daily.        . cetirizine (ZYRTEC) 10 MG tablet Take 10 mg by mouth daily.        . hydrocodone-acetaminophen (LORCET-HD) 5-500 MG per capsule Take 1 capsule by mouth every  6 (six) hours as needed.        Marland Kitchen levothyroxine (SYNTHROID, LEVOTHROID) 100 MCG tablet Take 100 mcg by mouth daily.        . nitroGLYCERIN (NITROSTAT) 0.3 MG SL tablet Place 0.3 mg under the tongue every 5 (five) minutes as needed.        . potassium chloride SA (K-DUR,KLOR-CON) 20 MEQ tablet Take 20 mEq by mouth 2 (two) times daily.        . Red Yeast Rice Extract (RED YEAST RICE PO) Take 1 tablet by mouth daily.        . valsartan-hydrochlorothiazide (DIOVAN-HCT) 320-25 MG per tablet Take 1 tablet by mouth daily.         REVIEW OF SYSTEMS: Arly.Keller ] denotes positive finding; [  ] denotes negative finding CARDIOVASCULAR:  [ ]  chest pain   [ ]  chest pressure   [ ]  palpitations   [ ]  orthopnea   [ ]  dyspnea on exertion   Arly.Keller ] claudication   [ ]  rest pain   [ ]  DVT   [ ]  phlebitis PULMONARY:   [ ]  productive cough   [ ]  asthma   [ ]  wheezing NEUROLOGIC:   Arly.Keller ] weakness- left arm  Arly.Keller ] paresthesias- left arm  Arly.Keller ] aphasia  [ ]   amaurosis  Arly.Keller ] dizziness HEMATOLOGIC:   [ ]  bleeding problems   [ ]  clotting disorders MUSCULOSKELETAL:  [ ]  joint pain   [ ]  joint swelling Arly.Keller ] leg swelling GASTROINTESTINAL: [ ]   blood in stool  [ ]   hematemesis GENITOURINARY:  [ ]   dysuria  [ ]   hematuria PSYCHIATRIC:  [ ]  history of major depression INTEGUMENTARY:  [ ]  rashes  [ ]  ulcers CONSTITUTIONAL:  [ ]  fever   [ ]  chills  PHYSICAL EXAM: Filed Vitals:   05/18/11 1448 05/18/11 1506  BP: 154/105 154/94  Pulse: 80 61  Resp: 18   Height: 5\' 4"  (1.626 m)   Weight: 192 lb (87.091 kg)   SpO2: 99% 97%   Body mass index is 32.96 kg/(m^2). GENERAL: The patient is a well-nourished female, in no acute distress. The vital signs are documented above. CARDIOVASCULAR: There is a regular rate and rhythm without significant murmur appreciated. I do not detect any carotid bruits. PULMONARY: There is good air exchange bilaterally without wheezing or rales. ABDOMEN: Soft and non-tender with normal pitched bowel sounds.    MUSCULOSKELETAL: There are no major deformities or cyanosis. NEUROLOGIC: No focal weakness or paresthesias are detected. SKIN: There are no ulcers or rashes noted. PSYCHIATRIC: The patient has a normal affect.  DATA:  1. Either reviewed the ultrasound from the referring institution. Although this was interpreted as showing a 50-69% left carotid stenosis by velocity criteria I would say that the left carotid stenosis is closer to 40% range. There was no evidence of significant stenosis on the right.  2. We repeated the carotid duplex scan in our office today. I independently interpreted this and this did not show any evidence of hemodynamically significant stenosis on either side. There is a less than 39% stenosis bilaterally. Vertebral arteries were patent with normally directed flow.  MEDICAL ISSUES: Given that she's having neck pain on the left than her symptoms in the left arm I think most likely her symptoms are related to potentially cervical disc disease. She did have one episode of what sounds like expressive aphasia. If she had repeated episodes which suggested a left hemispheric event then consideration could be given to cerebral arteriography. However, he been a slight risk of stroke associated with this procedure at this point I think the risk is not justified. Her carotid duplex scan essentially looked normal in our office today. Have ordered a follow up duplex scan in 1 year now see her back at that time. She knows to call sooner if she has problems.  Dorsey Authement S [facility] [place] Office: [phone]

## 2011-06-01 NOTE — Procedures (Unsigned)
CAROTID DUPLEX EXAM  INDICATION:  Carotid stenosis.  HISTORY: Diabetes:  Borderline. Cardiac:  Yes. Hypertension:  Yes. Smoking:  Quit in 1987. Previous Surgery:  No. CV History: Amaurosis Fugax No, Paresthesias No, Hemiparesis No.                                      RIGHT             LEFT Brachial systolic pressure:         130               133 Brachial Doppler waveforms:         Normal            Normal Vertebral direction of flow:        Antegrade         Antegrade DUPLEX VELOCITIES (cm/sec) CCA peak systolic                   80                81 ECA peak systolic                   84                158 ICA peak systolic                   80                67 ICA end diastolic                   32                32 PLAQUE MORPHOLOGY:                  Heterogenous      Heterogenous PLAQUE AMOUNT:                      Minimal           Minimal PLAQUE LOCATION:                    CCA, ICA          CCA, bifurcation   IMPRESSION: 1. No hemodynamically significant stenosis of the right or left     internal carotid artery. 2. Antegrade vertebral arteries bilaterally.       ___________________________________________ Di Kindle. Edilia Bo, M.D.  EM/MEDQ  D:  05/18/2011  T:  05/18/2011  Job:  [id number]

## 2011-06-14 ENCOUNTER — Encounter: Payer: Self-pay | Admitting: Vascular Surgery

## 2011-06-14 ENCOUNTER — Other Ambulatory Visit: Payer: Self-pay

## 2011-08-22 ENCOUNTER — Encounter: Payer: Self-pay | Admitting: Gastroenterology

## 2012-01-27 ENCOUNTER — Encounter: Payer: Self-pay | Admitting: Gastroenterology

## 2012-01-29 DIAGNOSIS — A048 Other specified bacterial intestinal infections: Secondary | ICD-10-CM

## 2012-01-29 HISTORY — DX: Other specified bacterial intestinal infections: A04.8

## 2012-01-31 ENCOUNTER — Encounter: Payer: Self-pay | Admitting: Gastroenterology

## 2012-01-31 ENCOUNTER — Ambulatory Visit (INDEPENDENT_AMBULATORY_CARE_PROVIDER_SITE_OTHER): Payer: Medicare Other | Admitting: Gastroenterology

## 2012-01-31 VITALS — BP 132/82 | HR 69 | Temp 97.5°F | Ht 64.5 in | Wt 194.6 lb

## 2012-01-31 DIAGNOSIS — R1314 Dysphagia, pharyngoesophageal phase: Secondary | ICD-10-CM

## 2012-01-31 DIAGNOSIS — Z860101 Personal history of adenomatous and serrated colon polyps: Secondary | ICD-10-CM | POA: Insufficient documentation

## 2012-01-31 DIAGNOSIS — K5900 Constipation, unspecified: Secondary | ICD-10-CM

## 2012-01-31 DIAGNOSIS — K219 Gastro-esophageal reflux disease without esophagitis: Secondary | ICD-10-CM

## 2012-01-31 DIAGNOSIS — R1310 Dysphagia, unspecified: Secondary | ICD-10-CM | POA: Insufficient documentation

## 2012-01-31 DIAGNOSIS — Z86010 Personal history of colonic polyps: Secondary | ICD-10-CM

## 2012-01-31 DIAGNOSIS — R1319 Other dysphagia: Secondary | ICD-10-CM

## 2012-01-31 NOTE — Patient Instructions (Addendum)
We have scheduled you for an upper endoscopy and a colonoscopy with Dr. [name]. Please see separate instructions.

## 2012-01-31 NOTE — Assessment & Plan Note (Signed)
Intermittent, chronic constipation. Infrequent MagCitrate use. Recommend adequate fluid intake daily, high fiber diet.

## 2012-01-31 NOTE — Progress Notes (Signed)
Primary Care Physician:  Darrow Bussing, MD  Primary Gastroenterologist:  Jonette Eva, MD   Chief Complaint  Patient presents with  . Colonoscopy    HPI:  Sylvia Anthony is a 56 y.o. female here to schedule surveillance colonoscopy. She has a history of colonoscopy in 2008 with adenomatous polyp with foci of high-grade dysplasia. Colonoscopy in April 2010, she had numerous colon polyps removed. Pathology revealed simple adenoma and hyperplastic polyps. Dr. [name] recommended she have a followup colonoscopy in 3 years.  H/O chronic constipation. Had "growth" removed as teenager during colonoscopy. May take occasional MagCitrate. No rectal bleeding. No abdominal pain. Heartburn on protonix. On medication for GERD for about four years. Lot of indigestion. Lots of dysphagia with solid foods. No prior EGD. Gel pills stick and come up 30 minutes later. Previously on Nexium, switched due to throat dryness. No n/v. No weight loss. No abdominal pain, melena.  Current Outpatient Prescriptions  Medication Sig Dispense Refill  . aspirin 325 MG tablet Take 325 mg by mouth daily.      . cetirizine (ZYRTEC) 10 MG tablet Take 10 mg by mouth daily.        . hydrocodone-acetaminophen (LORCET-HD) 5-500 MG per capsule Take 1 capsule by mouth every 6 (six) hours as needed.        Marland Kitchen levothyroxine (SYNTHROID, LEVOTHROID) 100 MCG tablet Take 100 mcg by mouth daily.        . nitroGLYCERIN (NITROSTAT) 0.3 MG SL tablet Place 0.3 mg under the tongue every 5 (five) minutes as needed.        . NON FORMULARY Calcium, magnesium, zinc   One tablet daily      . pantoprazole (PROTONIX) 40 MG tablet Take 40 mg by mouth daily.      . potassium chloride SA (K-DUR,KLOR-CON) 20 MEQ tablet Take 20 mEq by mouth 1 day or 1 dose.       . Red Yeast Rice Extract (RED YEAST RICE PO) Take 1 tablet by mouth daily.        . valsartan-hydrochlorothiazide (DIOVAN-HCT) 320-25 MG per tablet Take 1 tablet by mouth daily.           Allergies as of 01/31/2012  . (No Known Allergies)    Past Medical History  Diagnosis Date  . Arm numbness   . Hypertension   . Pedal edema   . Neck pain   . Hyperlipidemia   . Thyroid disease   . Carotid bruit   . Anemia   . Fibromyalgia   . Arterial fibromuscular dysplasia   . Heart murmur   . Diabetes mellitus     border line  . Peripheral vascular disease   . CHF (congestive heart failure) 1997    pacemaker  . GERD (gastroesophageal reflux disease)     Past Surgical History  Procedure Date  . Abdominal hysterectomy     partial  . Appendectomy   . Colonoscopy 08/01/2006    3 mm descending colon polyp removed/8 mm sessile ascending colon polyp removed / 3-mm rectal  polyp removed /Rare sigmoid diverticulosis/ Moderate internal hemorrhoids.Advanced adenoma on colonoscopy in March 2008.  The polyp was anadenomatous polyp with a foci of high-grade dysplasia  . Colonoscopy 09/08/2008    SIMPLE ADENOMA/HYPERPLASTIC POLY/Multiple colon polyps (ascending, sigmoid, rectal)  Mild sigmoid colon diverticulosis./ Small internal hemorrhoids  . Cholecystectomy   . Ventricular cardiac pacemaker insertion     Mitral valve prolapse/bradycardia  . Tonsillectomy   . Right achilles tendon  X 2  . Cardiac catheterization     multiple  . Growth removed from intestine     [facility]-as teenager, done through colonoscopy    Family History  Problem Relation Age of Onset  . Hyperlipidemia Mother   . Hypertension Mother   . Heart disease Father   . Hypertension Father   . Hyperlipidemia Sister   . Hypertension Sister   . Hyperlipidemia Brother   . Hypertension Brother   . Colon polyps Brother   . Colon cancer Neg Hx     History   Social History  . Marital Status: Married    Spouse Name: N/A    Number of Children: 1  . Years of Education: N/A   Occupational History  . part-time hairdresser    Social History Main Topics  . Smoking status: Former Games developer  . Smokeless  tobacco: Not on file   Comment: just in teens  . Alcohol Use: No  . Drug Use: No  . Sexually Active: Not on file   Other Topics Concern  . Not on file   Social History Narrative  . No narrative on file    ROS:  General: Negative for anorexia, weight loss, fever, chills, fatigue, weakness. Eyes: Negative for vision changes.  ENT: Negative for hoarseness, nasal congestion. See history of present illness. CV: Negative for chest pain, angina, palpitations, dyspnea on exertion, peripheral edema.  Respiratory: Negative for dyspnea at rest, dyspnea on exertion, cough, sputum, wheezing.  GI: See history of present illness. GU:  Negative for dysuria, hematuria, urinary incontinence, urinary frequency, nocturnal urination.  MS: Negative for joint pain, low back pain.  Derm: Negative for rash or itching.  Neuro: Negative for weakness, abnormal sensation, seizure, frequent headaches, memory loss, confusion.  Psych: Negative for anxiety, depression, suicidal ideation, hallucinations.  Endo: Negative for unusual weight change.  Heme: Negative for bruising or bleeding. Allergy: Negative for rash or hives.    Physical Examination:  BP 132/82  Pulse 69  Temp 97.5 F (36.4 C) (Temporal)  Ht 5' 4.5" (1.638 m)  Wt 194 lb 9.6 oz (88.27 kg)  BMI 32.89 kg/m2   General: Well-nourished, well-developed in no acute distress.  Head: Normocephalic, atraumatic.   Eyes: Conjunctiva pink, no icterus. Mouth: Oropharyngeal mucosa moist and pink , no lesions erythema or exudate. Neck: Supple without thyromegaly, masses, or lymphadenopathy.  Lungs: Clear to auscultation bilaterally.  Heart: Regular rate and rhythm, no murmurs rubs or gallops.  Abdomen: Bowel sounds are normal, nontender, nondistended, no hepatosplenomegaly or masses, no abdominal bruits or    hernia , no rebound or guarding.   Rectal: deferred to time of colonoscopy Extremities: No lower extremity edema. No clubbing or deformities.   Neuro: Alert and oriented x 4 , grossly normal neurologically.  Skin: Warm and dry, no rash or jaundice.   Psych: Alert and cooperative, normal mood and affect.  Imaging Studies: No results found.

## 2012-01-31 NOTE — Assessment & Plan Note (Signed)
History of GERD, on PPI for about 4 years. Patient complains of refractory indigestion and solid food esophageal dysphagia. Complains of difficulty swallowing gel capsules, often come back up several minutes later. Recommend EGD with esophageal dilation in the near future with Dr. [name].  I have discussed the risks, alternatives, benefits with regards to but not limited to the risk of reaction to medication, bleeding, infection, perforation and the patient is agreeable to proceed. Written consent to be obtained.

## 2012-01-31 NOTE — Progress Notes (Signed)
Faxed to PCP

## 2012-01-31 NOTE — Assessment & Plan Note (Signed)
History of adenomatous colon polyps, previously had foci of high-grade dysplasia. Due for surveillance colonoscopy at this time.  I have discussed the risks, alternatives, benefits with regards to but not limited to the risk of reaction to medication, bleeding, infection, perforation and the patient is agreeable to proceed. Written consent to be obtained.

## 2012-02-01 ENCOUNTER — Other Ambulatory Visit: Payer: Self-pay | Admitting: Gastroenterology

## 2012-02-01 DIAGNOSIS — R1310 Dysphagia, unspecified: Secondary | ICD-10-CM

## 2012-02-01 DIAGNOSIS — Z86010 Personal history of colonic polyps: Secondary | ICD-10-CM

## 2012-02-01 DIAGNOSIS — K5900 Constipation, unspecified: Secondary | ICD-10-CM

## 2012-02-01 MED ORDER — PEG-KCL-NACL-NASULF-NA ASC-C 100 G PO SOLR: 1.0000 | ORAL | Status: DC

## 2012-02-21 ENCOUNTER — Encounter (HOSPITAL_COMMUNITY): Payer: Self-pay | Admitting: Pharmacy Technician

## 2012-02-27 ENCOUNTER — Encounter (HOSPITAL_COMMUNITY): Payer: Self-pay | Admitting: *Deleted

## 2012-02-27 ENCOUNTER — Ambulatory Visit (HOSPITAL_COMMUNITY)
Admission: RE | Admit: 2012-02-27 | Discharge: 2012-02-27 | Disposition: A | Discharge status: Home / Self Care | Payer: BC Managed Care – PPO | Source: Ambulatory Visit | Attending: Gastroenterology | Admitting: Gastroenterology

## 2012-02-27 ENCOUNTER — Encounter (HOSPITAL_COMMUNITY)
Admission: RE | Disposition: A | Discharge status: Home / Self Care | Payer: Self-pay | Source: Ambulatory Visit | Attending: Gastroenterology

## 2012-02-27 DIAGNOSIS — K5900 Constipation, unspecified: Secondary | ICD-10-CM

## 2012-02-27 DIAGNOSIS — A048 Other specified bacterial intestinal infections: Secondary | ICD-10-CM | POA: Insufficient documentation

## 2012-02-27 DIAGNOSIS — Z86010 Personal history of colonic polyps: Secondary | ICD-10-CM

## 2012-02-27 DIAGNOSIS — K648 Other hemorrhoids: Secondary | ICD-10-CM | POA: Insufficient documentation

## 2012-02-27 DIAGNOSIS — K2970 Gastritis, unspecified, without bleeding: Secondary | ICD-10-CM

## 2012-02-27 DIAGNOSIS — R1310 Dysphagia, unspecified: Secondary | ICD-10-CM | POA: Insufficient documentation

## 2012-02-27 DIAGNOSIS — E119 Type 2 diabetes mellitus without complications: Secondary | ICD-10-CM | POA: Insufficient documentation

## 2012-02-27 DIAGNOSIS — K5730 Diverticulosis of large intestine without perforation or abscess without bleeding: Secondary | ICD-10-CM

## 2012-02-27 DIAGNOSIS — I10 Essential (primary) hypertension: Secondary | ICD-10-CM | POA: Insufficient documentation

## 2012-02-27 DIAGNOSIS — E785 Hyperlipidemia, unspecified: Secondary | ICD-10-CM | POA: Insufficient documentation

## 2012-02-27 DIAGNOSIS — K2990 Gastroduodenitis, unspecified, without bleeding: Secondary | ICD-10-CM

## 2012-02-27 DIAGNOSIS — Z1211 Encounter for screening for malignant neoplasm of colon: Secondary | ICD-10-CM | POA: Insufficient documentation

## 2012-02-27 DIAGNOSIS — K2940 Chronic atrophic gastritis without bleeding: Secondary | ICD-10-CM | POA: Insufficient documentation

## 2012-02-27 HISTORY — DX: Hypothyroidism, unspecified: E03.9

## 2012-02-27 HISTORY — PX: COLONOSCOPY WITH ESOPHAGOGASTRODUODENOSCOPY (EGD): SHX5779

## 2012-02-27 SURGERY — COLONOSCOPY, ESOPHAGOGASTRODUODENOSCOPY (EGD) AND ESOPHAGEAL DILATION (ED)
Anesthesia: Moderate Sedation

## 2012-02-27 MED ORDER — MEPERIDINE HCL 100 MG/ML IJ SOLN
INTRAMUSCULAR | Status: DC | PRN
Start: 2012-02-27 — End: 2012-02-27
  Administered 2012-02-27 (×2): 25 mg
  Administered 2012-02-27: 25 mg via INTRAVENOUS

## 2012-02-27 MED ORDER — STERILE WATER FOR IRRIGATION IR SOLN
Status: DC | PRN
  Administered 2012-02-27: 09:00:00

## 2012-02-27 MED ORDER — MIDAZOLAM HCL 5 MG/5ML IJ SOLN
INTRAMUSCULAR | Status: AC
  Filled 2012-02-27: qty 10

## 2012-02-27 MED ORDER — MIDAZOLAM HCL 5 MG/5ML IJ SOLN
INTRAMUSCULAR | Status: DC | PRN
  Administered 2012-02-27 (×2): 1 mg via INTRAVENOUS
  Administered 2012-02-27: 2 mg via INTRAVENOUS

## 2012-02-27 MED ORDER — BUTAMBEN-TETRACAINE-BENZOCAINE 2-2-14 % EX AERO
INHALATION_SPRAY | CUTANEOUS | Status: DC | PRN
  Administered 2012-02-27: 1 via TOPICAL

## 2012-02-27 MED ORDER — MINERAL OIL PO OIL
TOPICAL_OIL | ORAL | Status: AC
  Filled 2012-02-27: qty 30

## 2012-02-27 MED ORDER — MEPERIDINE HCL 100 MG/ML IJ SOLN
INTRAMUSCULAR | Status: AC
  Filled 2012-02-27: qty 2

## 2012-02-27 MED ORDER — SODIUM CHLORIDE 0.45 % IV SOLN
INTRAVENOUS | Status: DC
  Administered 2012-02-27: 08:00:00 via INTRAVENOUS

## 2012-02-27 NOTE — Op Note (Signed)
[facility] [address] [place], [address]   ENDOSCOPY PROCEDURE REPORT  PATIENT: Sylvia Anthony, Sylvia Anthony  MR#: [id number] BIRTHDATE: 09/26/1955 , 55  yrs. old GENDER: Female  ENDOSCOPIST: Jonette Eva, MD REFFERED [id number] Sherril Croon, M.D.  PROCEDURE DATE:  02/27/2012 PROCEDURE:   EGD with biopsy and EGD with dilatation over guidewire   INDICATIONS:1.  dysphagia. MEDICATIONS: TCS + Versed 1mg  IV TOPICAL ANESTHETIC: Cetacaine Spray  DESCRIPTION OF PROCEDURE:   After the risks benefits and alternatives of the procedure were thoroughly explained, informed consent was obtained.  The EG-[id number] ([id number])  endoscope was introduced through the mouth and advanced to the second portion of the duodenum.  The instrument was slowly withdrawn as the mucosa was carefully examined.  Prior to withdrawal of the scope, the guidwire was placed.  The esophagus was dilated successfully.  The patient was recovered in endoscopy and discharged home in satisfactory condition.      ESOPHAGUS: The mucosa of the esophagus appeared normal.  STOMACH: Non-erosive gastritis (inflammation) was found.  Multiple biopsies were performed using cold forceps.  DUODENUM: The duodenal mucosa showed no abnormalities.   Dilation was then performed at the gastroesphageal junction  Dilator: Savary over guidewire Size(s): 15-17 mm  MILD TO MODERATE  COMPLICATIONS: There were no complications.   ENDOSCOPIC IMPRESSION: 1.   The mucosa of the esophagus appeared normal 2.   Non-erosive gastritis (inflammation) was found; multiple biopsies 3.   The duodenal mucosa showed no abnormalities  RECOMMENDATIONS: CONTINUE PROTONIX.  TAKE 30 MINS PRIOR TO BREAKFAST AND SUPPER.  FOLLOW A HIGH FIBER/LOW FAT DIET DIET.  AVOID ITEMS THAT CAUSE BLOATING.  BODY MASS INDEX IS 32.  LOSE 10 TO 20 LBS.  BIOPSY WILL BE BACK IN 7 DAYS.  Follow up in 4 mos.  IF DYSPHAGIA CONTINUES, PT NEEDS A  BPE.        _______________________________ Rosalie DoctorJonette Eva, MD 02/27/2012 10:06 AM      PATIENT NAME:  Sylvia Anthony, Sylvia Anthony MR#: [id number]

## 2012-02-27 NOTE — H&P (Signed)
Primary Care Physician:  Darrow Bussing, MD Primary Gastroenterologist:  Dr. [name]  Pre-Procedure History & Physical: HPI:  Sylvia Anthony is a 56 y.o. female here for DYSPHAGIA/personal hx of polyps.   Past Medical History  Diagnosis Date  . Arm numbness   . Hypertension   . Pedal edema   . Neck pain   . Hyperlipidemia   . Thyroid disease   . Carotid bruit   . Anemia   . Fibromyalgia   . Arterial fibromuscular dysplasia   . Heart murmur   . Diabetes mellitus     border line  . Peripheral vascular disease   . CHF (congestive heart failure) 1997    pacemaker  . GERD (gastroesophageal reflux disease)   . Hypothyroidism     Past Surgical History  Procedure Date  . Abdominal hysterectomy     partial  . Appendectomy   . Colonoscopy 08/01/2006    3 mm descending colon polyp removed/8 mm sessile ascending colon polyp removed / 3-mm rectal  polyp removed /Rare sigmoid diverticulosis/ Moderate internal hemorrhoids.Advanced adenoma on colonoscopy in March 2008.  The polyp was anadenomatous polyp with a foci of high-grade dysplasia  . Colonoscopy 09/08/2008    SIMPLE ADENOMA/HYPERPLASTIC POLY/Multiple colon polyps (ascending, sigmoid, rectal)  Mild sigmoid colon diverticulosis./ Small internal hemorrhoids  . Cholecystectomy   . Ventricular cardiac pacemaker insertion     Mitral valve prolapse/bradycardia  . Tonsillectomy   . Right achilles tendon     X 2  . Cardiac catheterization     multiple  . Growth removed from intestine     [facility]-as teenager, done through colonoscopy    Prior to Admission medications   Medication Sig Start Date End Date Taking? Authorizing Provider  aspirin 325 MG tablet Take 325 mg by mouth daily.   Yes Historical Provider, MD  atorvastatin (LIPITOR) 10 MG tablet Take 10 mg by mouth daily.   Yes Historical Provider, MD  levothyroxine (SYNTHROID, LEVOTHROID) 100 MCG tablet Take 100 mcg by mouth daily.     Yes Historical Provider, MD  NON  FORMULARY Calcium, magnesium, zinc   One tablet daily   Yes Historical Provider, MD  pantoprazole (PROTONIX) 40 MG tablet Take 40 mg by mouth 2 (two) times daily.    Yes Historical Provider, MD  potassium chloride SA (K-DUR,KLOR-CON) 20 MEQ tablet Take 20 mEq by mouth daily.    Yes Historical Provider, MD  valsartan-hydrochlorothiazide (DIOVAN-HCT) 320-25 MG per tablet Take 1 tablet by mouth daily.     Yes Historical Provider, MD  cetirizine (ZYRTEC) 10 MG tablet Take 10 mg by mouth at bedtime as needed. allergies    Historical Provider, MD  hydrocodone-acetaminophen (LORCET-HD) 5-500 MG per capsule Take 1 capsule by mouth every 6 (six) hours as needed. fibromyalgia    Historical Provider, MD  nitroGLYCERIN (NITROSTAT) 0.3 MG SL tablet Place 0.3 mg under the tongue every 5 (five) minutes as needed.      Historical Provider, MD    Allergies as of 02/01/2012  . (No Known Allergies)    Family History  Problem Relation Age of Onset  . Hyperlipidemia Mother   . Hypertension Mother   . Heart disease Father   . Hypertension Father   . Hyperlipidemia Sister   . Hypertension Sister   . Hyperlipidemia Brother   . Hypertension Brother   . Colon polyps Brother   . Colon cancer Neg Hx     History   Social History  . Marital Status:  Married    Spouse Name: N/A    Number of Children: 1  . Years of Education: N/A   Occupational History  . part-time hairdresser    Social History Main Topics  . Smoking status: Former Smoker -- 0.2 packs/day for 4 years  . Smokeless tobacco: Not on file   Comment: just in teens  . Alcohol Use: No  . Drug Use: No  . Sexually Active: Not on file   Other Topics Concern  . Not on file   Social History Narrative  . No narrative on file    Review of Systems: See HPI, otherwise negative ROS   Physical Exam: BP 155/87  Pulse 60  Temp 97.9 F (36.6 C) (Oral)  Resp 15  Ht 5' 4.5" (1.638 m)  Wt 194 lb (87.998 kg)  BMI 32.79 kg/m2  SpO2  99% General:   Alert,  pleasant and cooperative in NAD Head:  Normocephalic and atraumatic. Neck:  Supple; Lungs:  Clear throughout to auscultation.    Heart:  Regular rate and rhythm. Abdomen:  Soft, nontender and nondistended. Normal bowel sounds, without guarding, and without rebound.   Neurologic:  Alert and  oriented x4;  grossly normal neurologically.  Impression/Plan:     DYSPhagia/personal history of polyps  PLAN:  TCS/EGD/dil TODAY

## 2012-02-27 NOTE — Op Note (Signed)
[facility] [address] [place], [address]   COLONOSCOPY PROCEDURE REPORT  PATIENT: Sylvia Anthony, Sylvia Anthony  MR#: [id number] BIRTHDATE: April 14, 1956 , 55  yrs. old GENDER: Female ENDOSCOPIST: Jonette Eva, MD REFERRED [id number] Sherril Croon, M.D. PROCEDURE DATE:  02/27/2012 PROCEDURE:   Colonoscopy, screening INDICATIONS:patient's personal history of advanced colon polyps-constipation MEDICATIONS: Demerol 75 mg IV and Versed 3 mg IV  DESCRIPTION OF PROCEDURE:    Physical exam was performed.  Informed consent was obtained from the patient after explaining the benefits, risks, and alternatives to procedure.  The patient was connected to monitor and placed in left lateral position. Continuous oxygen was provided by nasal cannula and IV medicine administered through an indwelling cannula.  After administration of sedation and rectal exam, the patients rectum was intubated and the EC-[id number] ([id number])  colonoscope was advanced under direct visualization to the cecum.  The scope was removed slowly by carefully examining the color, texture, anatomy, and integrity mucosa on the way out.  The patient was recovered in endoscopy and discharged home in satisfactory condition.       COLON FINDINGS: Mild diverticulosis was noted in the sigmoid colon. , The colon was otherwise normal.  There was no  inflammation, polyps or cancers unless previously stated.  , and Small internal hemorrhoids were found.  PREP QUALITY: good. CECAL W/D TIME: 13 minutes  COMPLICATIONS: None  ENDOSCOPIC IMPRESSION: 1.   Mild diverticulosis was noted in the sigmoid colon 2.   The colon was otherwise normal 3.   Small internal hemorrhoids   RECOMMENDATIONS: 1.  CONTINUE PROTONIX.  TAKE 30 MINS PRIOR TO BREAKFAST AND SUPPER.   FOLLOW A HIGH FIBER/LOW FAT DIET DIET.  AVOID ITEMS THAT CAUSE BLOATING.  BODY MASS INDEX IS 32.  LOSE 10 TO 20 LBS.  BIOPSY WILL BE BACK IN 7 DAYS.  Follow up in 4  mos.  Next colonoscopy in 5 years. 2.  CONTINUE PROTONIX.  TAKE 30 MINS PRIOR TO BREAKFAST AND SUPPER.   FOLLOW A HIGH FIBER/LOW FAT DIET DIET.  AVOID ITEMS THAT CAUSE BLOATING.  BODY MASS INDEX IS 32.  LOSE 10 TO 20 LBS.  BIOPSY WILL BE BACK IN 7 DAYS.  Follow up in 4 mos.  Next colonoscopy in 5 years.       _______________________________ Rosalie DoctorJonette Eva, MD 02/27/2012 9:53 AM     PATIENT NAME:  Sylvia Anthony MR#: [id number]

## 2012-02-28 ENCOUNTER — Telehealth: Payer: Self-pay | Admitting: Gastroenterology

## 2012-02-28 NOTE — Telephone Encounter (Signed)
Reminder in epic to follow up in 4 months °

## 2012-02-28 NOTE — Telephone Encounter (Signed)
PLEASE CALL PT. Her stomach Bx showed H. Pylori infection. She needs AMOXICILLIN 500 mg 2 po BID for 10 days and Biaxin 500 mg po bid for 10 days, #qs, rfx0. TAKE PROTONIX BID. DO NOT TAKE LIPITOR  WHILE TAKING THE ANTIBIOTICS. Med side effects include NVD, abd pain, and metallic taste. OPV IN 4 MOS.

## 2012-02-28 NOTE — Telephone Encounter (Signed)
Results faxed to PCP 

## 2012-02-28 NOTE — Telephone Encounter (Signed)
LMOM to call.

## 2012-02-29 NOTE — Telephone Encounter (Signed)
PLEASE CALL PT.  RX ALREADY SENT. NO IT DOES NOT COME IN LIQUID.

## 2012-02-29 NOTE — Telephone Encounter (Signed)
Pt is aware meds will not be in liquid.

## 2012-02-29 NOTE — Telephone Encounter (Signed)
Pt called and was informed. ( She uses [facility] in [place]) . She would like to know if she can get the meds in liquid. Said she had her esophagus stretched and she might be able to swallow OK but she would prefer liquid if possible.

## 2012-02-29 NOTE — Telephone Encounter (Signed)
Called the pharmacist at [facility]. The antiobiotic prescriptions were not there, so I called them to Sylvia Anthony. Also, pt had prescription for the Protonix bid, last got it on 02/09/2012 and it had no refills, so I told him to give one refill on that so she will be covered. He will also remind pt not to take the Lipitor while on the antibiotics.

## 2012-02-29 NOTE — Telephone Encounter (Signed)
Pt called back and said to call her on her cell phone when I call her meds in.   [phone].

## 2012-03-01 ENCOUNTER — Telehealth: Payer: Self-pay | Admitting: Gastroenterology

## 2012-03-01 MED ORDER — PANTOPRAZOLE SODIUM 40 MG PO TBEC: 40.0000 mg | DELAYED_RELEASE_TABLET | Freq: Two times a day (BID) | ORAL | Status: DC

## 2012-03-01 MED ORDER — PROMETHAZINE HCL 12.5 MG PO TABS: ORAL_TABLET | ORAL | Status: DC

## 2012-03-01 NOTE — Telephone Encounter (Signed)
LMOM to call.

## 2012-03-01 NOTE — Telephone Encounter (Signed)
Pt called to speak with DS about her stomach still hurting her since Monday from the EGD/TCS. Please call her back at [phone]

## 2012-03-01 NOTE — Telephone Encounter (Signed)
Pt returned call. She said she has had some abdominal pain since her procedures on Mon. It is intermittent, but lasted several hours last night. She has not had a BM. She is feeling a lot of nausea, but has not vomited. But she is unable to eat very much. Please advise!

## 2012-03-01 NOTE — Telephone Encounter (Signed)
PLEASE CALL PT. SHE MOST LIKELY HAS BOWEL SPASM FROM THE AIR IN HER COLON. SHE SHOULD FOLLOW A FULL LIQUID DIET FOR 3 DAYS. SHE WILL EVENTUALLY PASS ALL THE AIR AND HAVE A BM. SHE MAY HAVE PHENERGAN FOR NAUSEA. RX SENT ALONG WITH REFILLS FOR PROTONIX. SHE SHOULD COMPLETE HER ABX FOR H PYLORI. CALL MON IF SHE IS NOT FEELING BETTER.

## 2012-03-02 NOTE — Telephone Encounter (Signed)
LMOM to call.

## 2012-03-05 MED ORDER — FLUCONAZOLE 150 MG PO TABS: 150.0000 mg | ORAL_TABLET | Freq: Once | ORAL | Status: DC

## 2012-03-05 NOTE — Telephone Encounter (Signed)
Pt called back and was informed. She said she wold like Diflucan called in for yeast infection since she is on the antibiotics. Please send to the [facility] in [place].

## 2012-03-05 NOTE — Addendum Note (Signed)
Addended by: [place] on: 03/05/2012 09:28 AM   Modules accepted: Orders

## 2012-03-05 NOTE — Telephone Encounter (Signed)
PLEASE CALL PT.  Rx sent. DO NOT TAKE LIPITOR AND ABX OR WITH DIFLUCAN.

## 2012-03-05 NOTE — Telephone Encounter (Signed)
Called and informed pt. She was aware and is not taking the Lipitor with the antibiotics.

## 2012-03-15 NOTE — Progress Notes (Signed)
EGD/DIL SEP 2013  H PYLORI GASTRITIS TCS SEP 2-13 [facility]  REVIEWED.

## 2012-03-28 ENCOUNTER — Encounter: Payer: Self-pay | Admitting: Gastroenterology

## 2012-03-28 ENCOUNTER — Telehealth: Payer: Self-pay | Admitting: *Deleted

## 2012-03-28 NOTE — Telephone Encounter (Signed)
ERROR

## 2012-03-28 NOTE — Telephone Encounter (Signed)
Forwarding to Dr.Fields.  

## 2012-03-28 NOTE — Telephone Encounter (Signed)
LMOM to call.

## 2012-03-28 NOTE — Telephone Encounter (Signed)
PLEASE CALL PT.  SHE DID NOT HAVE ANY RECENT BIOPSIES FROM DR. Malon Branton. SHE HAD ENDOSCOPY 9/30. HER RESULTS WERE CALLED TO HER 10/1. SEE PRIOR TC.

## 2012-03-28 NOTE — Telephone Encounter (Signed)
Sylvia Anthony called today in regards to her recent biopsy. She would like to know her results. Please follow up with her. Thanks.

## 2012-03-29 ENCOUNTER — Telehealth: Payer: Self-pay | Admitting: *Deleted

## 2012-03-29 NOTE — Telephone Encounter (Signed)
Ms Heimann called back today. Please call her on her work number. Thanks.

## 2012-03-29 NOTE — Telephone Encounter (Signed)
Pt returned call and was informed of results that she had previously been given.

## 2012-03-29 NOTE — Telephone Encounter (Signed)
Called and reviewed results that pt had previously been given.

## 2012-05-16 ENCOUNTER — Ambulatory Visit: Payer: Medicare Other | Admitting: Vascular Surgery

## 2012-05-16 ENCOUNTER — Other Ambulatory Visit: Payer: Medicare Other

## 2012-05-17 ENCOUNTER — Encounter: Payer: Self-pay | Admitting: *Deleted

## 2012-06-19 ENCOUNTER — Encounter: Payer: Self-pay | Admitting: Neurosurgery

## 2012-06-20 ENCOUNTER — Ambulatory Visit (INDEPENDENT_AMBULATORY_CARE_PROVIDER_SITE_OTHER): Payer: BC Managed Care – PPO | Admitting: *Deleted

## 2012-06-20 ENCOUNTER — Ambulatory Visit (INDEPENDENT_AMBULATORY_CARE_PROVIDER_SITE_OTHER): Payer: BC Managed Care – PPO | Admitting: Neurosurgery

## 2012-06-20 ENCOUNTER — Encounter: Payer: Self-pay | Admitting: Neurosurgery

## 2012-06-20 VITALS — BP 131/86 | HR 62 | Resp 18 | Ht 64.5 in | Wt 189.0 lb

## 2012-06-20 DIAGNOSIS — M542 Cervicalgia: Secondary | ICD-10-CM

## 2012-06-20 DIAGNOSIS — I6529 Occlusion and stenosis of unspecified carotid artery: Secondary | ICD-10-CM

## 2012-06-20 NOTE — Progress Notes (Addendum)
[facility] Carotid Office Note  CC: Carotid surveillance Referring Physician: Edilia Bo  History of Present Illness: 57 year old female patient of Dr. Edilia Bo saw in 2012 is initial carotid consult. The patient denies any signs or symptoms of CVA, TIA, amaurosis fugax or any neural deficit.  Past Medical History  Diagnosis Date  . Arm numbness   . Hypertension   . Pedal edema   . Neck pain   . Hyperlipidemia   . Thyroid disease   . Carotid bruit   . Anemia   . Fibromyalgia   . Arterial fibromuscular dysplasia   . Heart murmur   . Diabetes mellitus     border line  . Peripheral vascular disease   . CHF (congestive heart failure) 1997    pacemaker  . GERD (gastroesophageal reflux disease)   . Hypothyroidism   . Mitral valve prolapse     ROS: [x]  Positive   [ ]  Denies    General: [ ]  Weight loss, [ ]  Fever, [ ]  chills Neurologic: [x ] Dizziness, [ ]  Blackouts, [ ]  Seizure [ ]  Stroke, [ ]  "Mini stroke", [ ]  Slurred speech, [ ]  Temporary blindness; [ ]  weakness in arms or legs, [ ]  Hoarseness Cardiac: [ ]  Chest pain/pressure, [ ]  Shortness of breath at rest [ ]  Shortness of breath with exertion, [ ]  Atrial fibrillation or irregular heartbeat Vascular: [ x] Pain in legs with walking, [ ]  Pain in legs at rest, [ ]  Pain in legs at night,  [ ]  Non-healing ulcer, [ ]  Blood clot in vein/DVT,   Pulmonary: [ ]  Home oxygen, [x ] Productive cough, [ ]  Coughing up blood, [ ]  Asthma,  [ ]  Wheezing Musculoskeletal:  [ ]  Arthritis, [ ]  Low back pain, [ ]  Joint pain Hematologic: [ ]  Easy Bruising, [ ]  Anemia; [ ]  Hepatitis Gastrointestinal: [ ]  Blood in stool, [ ]  Gastroesophageal Reflux/heartburn, [ ]  Trouble swallowing Urinary: [ ]  chronic Kidney disease, [ ]  on HD - [ ]  MWF or [ ]  TTHS, [ ]  Burning with urination, [ ]  Difficulty urinating Skin: [ ]  Rashes, [ ]  Wounds Psychological: [ ]  Anxiety, [ ]  Depression   Social History History  Substance Use  Topics  . Smoking status: Former Smoker -- 0.2 packs/day for 4 years  . Smokeless tobacco: Never Used     Comment: just in teens  . Alcohol Use: No    Family History Family History  Problem Relation Age of Onset  . Hyperlipidemia Mother   . Hypertension Mother   . Heart disease Father   . Hypertension Father   . Hyperlipidemia Sister   . Hypertension Sister   . Hyperlipidemia Brother   . Hypertension Brother   . Colon polyps Brother   . Colon cancer Neg Hx     No Known Allergies  Current Outpatient Prescriptions  Medication Sig Dispense Refill  . albuterol (PROVENTIL) (2.5 MG/3ML) 0.083% nebulizer solution Take 2.5 mg by nebulization every 6 (six) hours as needed.      Marland Kitchen amoxicillin (AMOXIL) 250 MG/5ML suspension continuous as needed.      Marland Kitchen aspirin 325 MG tablet Take 325 mg by mouth daily.      Marland Kitchen atorvastatin (LIPITOR) 10 MG tablet Take 10 mg by mouth daily.      . cetirizine (ZYRTEC) 10 MG tablet Take 10 mg by mouth at bedtime as needed. allergies      . hydrocodone-acetaminophen (LORCET-HD) 5-500 MG per capsule Take 1  capsule by mouth every 6 (six) hours as needed. fibromyalgia      . levothyroxine (SYNTHROID, LEVOTHROID) 100 MCG tablet Take 100 mcg by mouth daily.        . nitroGLYCERIN (NITROSTAT) 0.3 MG SL tablet Place 0.3 mg under the tongue every 5 (five) minutes as needed.        . NON FORMULARY Calcium, magnesium, zinc   One tablet daily      . pantoprazole (PROTONIX) 40 MG tablet Take 1 tablet (40 mg total) by mouth 2 (two) times daily.  62 tablet  11  . potassium chloride SA (K-DUR,KLOR-CON) 20 MEQ tablet Take 20 mEq by mouth daily.       . promethazine (PHENERGAN) 12.5 MG tablet 1-2 po q4-6 h prn nausea or vomiting  30 tablet  0  . simvastatin (ZOCOR) 40 MG tablet Take 40 mg by mouth every evening.      . valsartan-hydrochlorothiazide (DIOVAN-HCT) 320-25 MG per tablet Take 1 tablet by mouth daily.        . fluconazole (DIFLUCAN) 150 MG tablet Take 1 tablet (150 mg  total) by mouth once.  1 tablet  0    Physical Examination  Filed Vitals:   06/20/12 1531  BP: 131/86  Pulse: 62  Resp:     Body mass index is 31.94 kg/(m^2).  General:  WDWN in NAD Gait: Normal HEENT: WNL Eyes: Pupils equal Pulmonary: normal non-labored breathing , without Rales, rhonchi,  wheezing Cardiac: RRR, without  Murmurs, rubs or gallops; Abdomen: soft, NT, no masses Skin: no rashes, ulcers noted  Vascular Exam Pulses: 3+ radial pulses bilaterally Carotid bruits: Carotid pulses to auscultation no bruits are heard Extremities without ischemic changes, no Gangrene , no cellulitis; no open wounds;  Musculoskeletal: no muscle wasting or atrophy   Neurologic: A&O X 3; Appropriate Affect ; SENSATION: normal; MOTOR FUNCTION:  moving all extremities equally. Speech is fluent/normal  Non-Invasive Vascular Imaging CAROTID DUPLEX 06/20/2012  Right ICA 0 - 19% stenosis Left ICA 0 - 19% stenosis   ASSESSMENT/PLAN: Asymptomatic patient with no plaque in her bilateral carotid arteries. I spoke with Dr. Edilia Bo who agrees the patient does not need followup. The patient's questions were encouraged and answered, she is in agreement with this plan, her followup here will be on a when necessary basis  Lauree Chandler ANP   Clinic MD: Edilia Bo

## 2012-06-21 ENCOUNTER — Other Ambulatory Visit: Payer: Self-pay | Admitting: *Deleted

## 2012-06-21 DIAGNOSIS — I6529 Occlusion and stenosis of unspecified carotid artery: Secondary | ICD-10-CM

## 2012-08-30 ENCOUNTER — Encounter: Payer: Self-pay | Admitting: Gastroenterology

## 2012-08-30 ENCOUNTER — Ambulatory Visit: Payer: BC Managed Care – PPO | Admitting: Gastroenterology

## 2012-08-30 ENCOUNTER — Ambulatory Visit (INDEPENDENT_AMBULATORY_CARE_PROVIDER_SITE_OTHER): Payer: BC Managed Care – PPO | Admitting: Gastroenterology

## 2012-08-30 VITALS — BP 154/97 | HR 79 | Temp 98.4°F | Ht 64.5 in | Wt 198.8 lb

## 2012-08-30 DIAGNOSIS — R1310 Dysphagia, unspecified: Secondary | ICD-10-CM

## 2012-08-30 DIAGNOSIS — K219 Gastro-esophageal reflux disease without esophagitis: Secondary | ICD-10-CM

## 2012-08-30 DIAGNOSIS — R1314 Dysphagia, pharyngoesophageal phase: Secondary | ICD-10-CM

## 2012-08-30 DIAGNOSIS — Z86010 Personal history of colonic polyps: Secondary | ICD-10-CM

## 2012-08-30 DIAGNOSIS — K5900 Constipation, unspecified: Secondary | ICD-10-CM

## 2012-08-30 NOTE — Assessment & Plan Note (Signed)
REVIEWED TCS REPORT. LAST TCS, PT HAD ABD PAIN  AFTERWARDS  TCS IN 2019 AND WILL ASPIRATE AS MUCH AIR AS POSSIBLE

## 2012-08-30 NOTE — Assessment & Plan Note (Signed)
RESOLVED  MONITOR SX OPV IN 6-12 MOS

## 2012-08-30 NOTE — Progress Notes (Signed)
CC PCP 

## 2012-08-30 NOTE — Assessment & Plan Note (Signed)
SX CONTROLLED.  LOSE WEIGHT LOW FAT DIET PROTONIX BID OPV IN 6-1 

## 2012-08-30 NOTE — Patient Instructions (Signed)
FOLLOW A HIGH FIBER/LOW FAT DIET. AVOID ITEMS THAT CAUSE BLOATING AND GAS. SEE INFO BELOW.  DRINK WATER TO KEEP HER URINE LIGHT YELLOW.  USE MG CITRATE AS NEEDED FOR A GOOD BM.  LOSE 10 TO 20 LBS.  FOLLOW UP IN OCT 2014.  High-Fiber Diet A high-fiber diet changes your normal diet to include more whole grains, legumes, fruits, and vegetables. Changes in the diet involve replacing refined carbohydrates with unrefined foods. The calorie level of the diet is essentially unchanged. The Dietary Reference Intake (recommended amount) for adult males is 38 grams per day. For adult females, it is 25 grams per day. Pregnant and lactating women should consume 28 grams of fiber per day. Fiber is the intact part of a plant that is not broken down during digestion. Functional fiber is fiber that has been isolated from the plant to provide a beneficial effect in the body. PURPOSE  Increase stool bulk.   Ease and regulate bowel movements.   Lower cholesterol.  INDICATIONS THAT YOU NEED MORE FIBER  Constipation and hemorrhoids.   Uncomplicated diverticulosis (intestine condition) and irritable bowel syndrome.   Weight management.   As a protective measure against hardening of the arteries (atherosclerosis), diabetes, and cancer.   DO NOT USE WITH:  Acute diverticulitis (intestine infection).   Partial small bowel obstructions.   Complicated diverticular disease involving bleeding, rupture (perforation), or abscess (boil, furuncle).   Presence of autonomic neuropathy (nerve damage) or gastroparesis (stomach cannot empty itself).    GUIDELINES FOR INCREASING FIBER IN THE DIET  Start adding fiber to the diet slowly. A gradual increase of about 5 more grams (2 slices of whole-wheat bread, 2 servings of most fruits or vegetables, or 1 bowl of high-fiber cereal) per day is best. Too rapid an increase in fiber may result in constipation, flatulence, and bloating.   Drink enough water and fluids  to keep your urine clear or pale yellow. Water, juice, or caffeine-free drinks are recommended. Not drinking enough fluid may cause constipation.   Eat a variety of high-fiber foods rather than one type of fiber.   Try to increase your intake of fiber through using high-fiber foods rather than fiber pills or supplements that contain small amounts of fiber.   The goal is to change the types of food eaten. Do not supplement your present diet with high-fiber foods, but replace foods in your present diet.    INCLUDE A VARIETY OF FIBER SOURCES  Replace refined and processed grains with whole grains, canned fruits with fresh fruits, and incorporate other fiber sources. White rice, white breads, and most bakery goods contain little or no fiber.   Brown whole-grain rice, buckwheat oats, and many fruits and vegetables are all good sources of fiber. These include: broccoli, Brussels sprouts, cabbage, cauliflower, beets, sweet potatoes, white potatoes (skin on), carrots, tomatoes, eggplant, squash, berries, fresh fruits, and dried fruits.   Cereals appear to be the richest source of fiber. Cereal fiber is found in whole grains and bran. Bran is the fiber-rich outer coat of cereal grain, which is largely removed in refining. In whole-grain cereals, the bran remains. In breakfast cereals, the largest amount of fiber is found in those with "bran" in their names. The fiber content is sometimes indicated on the label.   You may need to include additional fruits and vegetables each day.   In baking, for 1 cup white flour, you may use the following substitutions:   1 cup whole-wheat flour minus 2 tablespoons.  1/2 cup white flour plus 1/2 cup whole-wheat flour.   Low-Fat Diet BREADS, CEREALS, PASTA, RICE, DRIED PEAS, AND BEANS These products are high in carbohydrates and most are low in fat. Therefore, they can be increased in the diet as substitutes for fatty foods. They too, however, contain calories and  should not be eaten in excess. Cereals can be eaten for snacks as well as for breakfast.   FRUITS AND VEGETABLES It is good to eat fruits and vegetables. Besides being sources of fiber, both are rich in vitamins and some minerals. They help you get the daily allowances of these nutrients. Fruits and vegetables can be used for snacks and desserts.  MEATS Limit lean meat, chicken, [place], and fish to no more than 6 ounces per day. Beef, Pork, and Lamb Use lean cuts of beef, pork, and lamb. Lean cuts include:  Extra-lean ground beef.  Arm roast.  Sirloin tip.  Center-cut ham.  Round steak.  Loin chops.  Rump roast.  Tenderloin.  Trim all fat off the outside of meats before cooking. It is not necessary to severely decrease the intake of red meat, but lean choices should be made. Lean meat is rich in protein and contains a highly absorbable form of iron. Premenopausal women, in particular, should avoid reducing lean red meat because this could increase the risk for low red blood cells (iron-deficiency anemia).  Chicken and [place] These are good sources of protein. The fat of poultry can be reduced by removing the skin and underlying fat layers before cooking. Chicken and [place] can be substituted for lean red meat in the diet. Poultry should not be fried or covered with high-fat sauces. Fish and Shellfish Fish is a good source of protein. Shellfish contain cholesterol, but they usually are low in saturated fatty acids. The preparation of fish is important. Like chicken and [place], they should not be fried or covered with high-fat sauces. EGGS Egg whites contain no fat or cholesterol. They can be eaten often. Try 1 to 2 egg whites instead of whole eggs in recipes or use egg substitutes that do not contain yolk. MILK AND DAIRY PRODUCTS Use skim or 1% milk instead of 2% or whole milk. Decrease whole milk, natural, and processed cheeses. Use nonfat or low-fat (2%) cottage cheese or low-fat cheeses  made from vegetable oils. Choose nonfat or low-fat (1 to 2%) yogurt. Experiment with evaporated skim milk in recipes that call for heavy cream. Substitute low-fat yogurt or low-fat cottage cheese for sour cream in dips and salad dressings. Have at least 2 servings of low-fat dairy products, such as 2 glasses of skim (or 1%) milk each day to help get your daily calcium intake. FATS AND OILS Reduce the total intake of fats, especially saturated fat. Butterfat, lard, and beef fats are high in saturated fat and cholesterol. These should be avoided as much as possible. Vegetable fats do not contain cholesterol, but certain vegetable fats, such as coconut oil, palm oil, and palm kernel oil are very high in saturated fats. These should be limited. These fats are often used in bakery goods, processed foods, popcorn, oils, and nondairy creamers. Vegetable shortenings and some peanut butters contain hydrogenated oils, which are also saturated fats. Read the labels on these foods and check for saturated vegetable oils. Unsaturated vegetable oils and fats do not raise blood cholesterol. However, they should be limited because they are fats and are high in calories. Total fat should still be limited to 30% of your  daily caloric intake. Desirable liquid vegetable oils are corn oil, cottonseed oil, olive oil, canola oil, safflower oil, soybean oil, and sunflower oil. Peanut oil is not as good, but small amounts are acceptable. Buy a heart-healthy tub margarine that has no partially hydrogenated oils in the ingredients. Mayonnaise and salad dressings often are made from unsaturated fats, but they should also be limited because of their high calorie and fat content. Seeds, nuts, peanut butter, olives, and avocados are high in fat, but the fat is mainly the unsaturated type. These foods should be limited mainly to avoid excess calories and fat. OTHER EATING TIPS Snacks  Most sweets should be limited as snacks. They tend to be  rich in calories and fats, and their caloric content outweighs their nutritional value. Some good choices in snacks are graham crackers, melba toast, soda crackers, bagels (no egg), English muffins, fruits, and vegetables. These snacks are preferable to snack crackers, [place] fries, TORTILLA CHIPS, and POTATO chips. Popcorn should be air-popped or cooked in small amounts of liquid vegetable oil. Desserts Eat fruit, low-fat yogurt, and fruit ices instead of pastries, cake, and cookies. Sherbet, angel food cake, gelatin dessert, frozen low-fat yogurt, or other frozen products that do not contain saturated fat (pure fruit juice bars, frozen ice pops) are also acceptable.  COOKING METHODS Choose those methods that use little or no fat. They include: Poaching.  Braising.  Steaming.  Grilling.  Baking.  Stir-frying.  Broiling.  Microwaving.  Foods can be cooked in a nonstick pan without added fat, or use a nonfat cooking spray in regular cookware. Limit fried foods and avoid frying in saturated fat. Add moisture to lean meats by using water, broth, cooking wines, and other nonfat or low-fat sauces along with the cooking methods mentioned above. Soups and stews should be chilled after cooking. The fat that forms on top after a few hours in the refrigerator should be skimmed off. When preparing meals, avoid using excess salt. Salt can contribute to raising blood pressure in some people.  EATING AWAY FROM HOME Order entres, potatoes, and vegetables without sauces or butter. When meat exceeds the size of a deck of cards (3 to 4 ounces), the rest can be taken home for another meal. Choose vegetable or fruit salads and ask for low-calorie salad dressings to be served on the side. Use dressings sparingly. Limit high-fat toppings, such as bacon, crumbled eggs, cheese, sunflower seeds, and olives. Ask for heart-healthy tub margarine instead of butter.

## 2012-08-30 NOTE — Progress Notes (Signed)
Subjective:    Patient ID: Sylvia Anthony, female    DOB: [birth year], 57 y.o.   MRN: [id number]  PCP: [id number]  HPI STILL HAVING PROBLEMS WITH CONSTIPATION. NOT DRINKING A LOT OF WATER. TOOK ABX FOR H PYLORI AND HAD OCCASIONAL LOOSE STOOLS. HEARTBURN CONTROLLED UNTIL SHE MAY EAT SOMETHING TO TRIGGER IT. BMS: 2-3 TIMES/WEEK. MAY GO A 1-2 WEEKS AT THE WORSE. MOVING MORE REG NOW. YOGURT, APPLESAUCE AND OATMEAL. MEAT MAKES EHR CONSTIPATED. Problems with sedation: ABD PAIN AFTER TCS/EGD. C/ ROARING IN HER LEFT EAR. NO HA, CP , SOB, DIZZINESS, OR FEELING OFF BALANCE.  PT DENIES FEVER, CHILLS, BRBPR, nausea, vomiting, melena, abd pain, problems swallowing.   Past Medical History  Diagnosis Date  . Arm numbness   . Hypertension   . Pedal edema   . Neck pain   . Hyperlipidemia   . Thyroid disease   . Carotid bruit   . Anemia   . Fibromyalgia   . Arterial fibromuscular dysplasia   . Heart murmur   . Diabetes mellitus     border line  . Peripheral vascular disease   . CHF (congestive heart failure) 1997    pacemaker  . GERD (gastroesophageal reflux disease)   . Hypothyroidism   . Mitral valve prolapse     Past Surgical History  Procedure Laterality Date  . Abdominal hysterectomy      partial  . Appendectomy    . Colonoscopy  08/01/2006    3 mm descending colon polyp removed/8 mm sessile ascending colon polyp removed / 3-mm rectal  polyp removed /Rare sigmoid diverticulosis/ Moderate internal hemorrhoids.Advanced adenoma on colonoscopy in March 2008.  The polyp was anadenomatous polyp with a foci of high-grade dysplasia  . Colonoscopy  09/08/2008    SIMPLE ADENOMA/HYPERPLASTIC POLY/Multiple colon polyps (ascending, sigmoid, rectal)  Mild sigmoid colon diverticulosis./ Small internal hemorrhoids  . Cholecystectomy    . Ventricular cardiac pacemaker insertion      Mitral valve prolapse/bradycardia  . Tonsillectomy    . Right achilles tendon      X 2  . Cardiac catheterization       multiple  . Growth removed from intestine      [facility]-as teenager, done through colonoscopy  . Colonoscopy with esophagogastroduodenoscopy (egd)  Sept. 30, 2013    Dr. Lisbeth Renshaw    No Known Allergies  Current Outpatient Prescriptions  Medication Sig Dispense Refill  . albuterol (PROVENTIL) (2.5 MG/3ML) 0.083% nebulizer solution Take 2.5 mg by nebulization every 6 (six) hours as needed.      Marland Kitchen aspirin 325 MG tablet Take 325 mg by mouth daily.      .        . cetirizine (ZYRTEC) 10 MG tablet Take 10 mg by mouth at bedtime as needed. allergies      . hydrocodone-acetaminophen (LORCET-HD) 5-500 MG per capsule Take 1 capsule by mouth every 6 (six) hours as needed. fibromyalgia      . levothyroxine (SYNTHROID, LEVOTHROID) 100 MCG tablet Take 100 mcg by mouth daily.        . nitroGLYCERIN (NITROSTAT) 0.3 MG SL tablet Place 0.3 mg under the tongue every 5 (five) minutes as needed.        . NON FORMULARY Calcium, magnesium, zinc   One tablet daily      . pantoprazole (PROTONIX) 40 MG tablet Take 1 tablet (40 mg total) by mouth 2 (two) times daily.    . potassium chloride SA (K-DUR,KLOR-CON) 20 MEQ tablet Take 20  mEq by mouth daily.       .      . simvastatin (ZOCOR) 40 MG tablet Take 40 mg by mouth every evening.      . valsartan-hydrochlorothiazide (DIOVAN-HCT) 320-25 MG per tablet Take 1 tablet by mouth daily.        Marland Kitchen amoxicillin (AMOXIL) 250 MG/5ML suspension continuous as needed.  FOR TEETH CLEANING    .          Review of Systems     Objective:   Physical Exam  Vitals reviewed. Constitutional: She is oriented to person, place, and time. She appears well-nourished. No distress.  HENT:  Head: Normocephalic and atraumatic.  Mouth/Throat: Oropharynx is clear and moist. No oropharyngeal exudate.  Eyes: Pupils are equal, round, and reactive to light. No scleral icterus.  Neck: Normal range of motion. Neck supple.  Cardiovascular: Normal rate, regular rhythm and normal heart sounds.    Pulmonary/Chest: Effort normal and breath sounds normal. No respiratory distress.  Abdominal: Soft. Bowel sounds are normal. She exhibits no distension. There is no tenderness.  Neurological: She is alert and oriented to person, place, and time.  NO FOCAL DEFICITS   Psychiatric: She has a normal mood and affect.          Assessment & Plan:

## 2012-08-30 NOTE — Assessment & Plan Note (Signed)
CHRONIC IDIOPATHIC-SX FAIRLY WELL CONTROLLED  HIGH FIBER DIET DRINK WATER MG CITRATE PRN OP IN 6 MOS TO 1 YEAR

## 2012-09-04 NOTE — Progress Notes (Signed)
REMINDER MADE 

## 2013-02-22 ENCOUNTER — Encounter: Payer: Self-pay | Admitting: Gastroenterology

## 2013-05-27 ENCOUNTER — Encounter: Payer: Self-pay | Admitting: Internal Medicine

## 2013-05-27 ENCOUNTER — Ambulatory Visit (INDEPENDENT_AMBULATORY_CARE_PROVIDER_SITE_OTHER): Payer: BC Managed Care – PPO | Admitting: *Deleted

## 2013-05-27 DIAGNOSIS — I495 Sick sinus syndrome: Secondary | ICD-10-CM

## 2013-05-27 DIAGNOSIS — Z950 Presence of cardiac pacemaker: Secondary | ICD-10-CM

## 2013-05-27 LAB — MDC_IDC_ENUM_SESS_TYPE_INCLINIC
Battery Impedance: 508 Ohm
Battery Remaining Longevity: 74 mo
Brady Statistic AP VP Percent: 0 %
Lead Channel Impedance Value: 873 Ohm
Lead Channel Impedance Value: 875 Ohm
Lead Channel Sensing Intrinsic Amplitude: 2 mV
Lead Channel Setting Pacing Amplitude: 1.5 V
Lead Channel Setting Pacing Pulse Width: 0.4 ms

## 2013-05-27 NOTE — Progress Notes (Signed)
Device check in clinic, all functions normal, no changes made, full details in [facility].  ROV w/ Dr. Allred/[place] in 66mo.

## 2013-07-08 ENCOUNTER — Encounter: Payer: Self-pay | Admitting: Interventional Cardiology

## 2013-07-08 ENCOUNTER — Ambulatory Visit (INDEPENDENT_AMBULATORY_CARE_PROVIDER_SITE_OTHER): Payer: BC Managed Care – PPO | Admitting: Interventional Cardiology

## 2013-07-08 VITALS — BP 140/90 | HR 62 | Ht 64.5 in | Wt 200.0 lb

## 2013-07-08 DIAGNOSIS — I495 Sick sinus syndrome: Secondary | ICD-10-CM

## 2013-07-08 DIAGNOSIS — Z950 Presence of cardiac pacemaker: Secondary | ICD-10-CM

## 2013-07-08 DIAGNOSIS — I639 Cerebral infarction, unspecified: Secondary | ICD-10-CM | POA: Insufficient documentation

## 2013-07-08 DIAGNOSIS — I10 Essential (primary) hypertension: Secondary | ICD-10-CM | POA: Insufficient documentation

## 2013-07-08 DIAGNOSIS — I6350 Cerebral infarction due to unspecified occlusion or stenosis of unspecified cerebral artery: Secondary | ICD-10-CM

## 2013-07-08 NOTE — Patient Instructions (Signed)
Your physician recommends that you continue on your current medications as directed. Please refer to the Current Medication list given to you today.  Non-Cardiac CT scanning, Head (CAT scanning), is a noninvasive, special x-ray that produces cross-sectional images of the body using x-rays and a computer. CT scans help physicians diagnose and treat medical conditions. For some CT exams, a contrast material is used to enhance visibility in the area of the body being studied. CT scans provide greater clarity and reveal more details than regular x-ray exams.  Your physician wants you to follow-up in: 1 year You will receive a reminder letter in the mail two months in advance. If you don't receive a letter, please call our office to schedule the follow-up appointment.

## 2013-07-08 NOTE — Progress Notes (Signed)
Patient ID: Sylvia Anthony, female   DOB: 05/30/1956, 58 y.o.   MRN: [id number] Past Medical History  Hypertension   Sick sinus syndrome   Hyperlipidemia   Hypothyroidism   fibromyalgia...[facility]      [address] [place], [place]  [address] Phone: [phone] Fax:  [phone]  Date:  07/08/2013   ID:  Sylvia Anthony, DOB 05/30/1956, MRN [id number]  PCP:  Earl ManyVyas, Chandra K, MD   ASSESSMENT:  1. Left sided numbness, persistent since acutely developing several days ago. No headache or other neurological complaint 2. Sick sinus syndrome, controlled with permanent DDD pacemaker 3. Hypertension, controlled 4. DDD pacemaker  PLAN:  1. CT scan with contrast to rule out lacunar infarct, right brain 2. Followup in one year 3. 81 mg aspirin daily 4. Continue current antihypertensive regimen   SUBJECTIVE: Sylvia Anthony is a 58 y.o. female who is here today for followup of hypertension and sick sinus syndrome. There been no medication issues and she denies cardiopulmonary complaints such as chest discomfort, dyspnea,, edema, and palpitations. She does describe a sudden onset of left arm and left lower extremity numbness. No other associated neurological complaints. This developed over the past 7-10 days. The intensity of the numbness has decreased but persists. The leg is more involved than the arm. No prior history of stroke. She has had carotid evaluations in the past, most recently in 2012 that of not demonstrated any evidence of disease. She been previously seen by Dr. Edilia Boickson.   Wt Readings from Last 3 Encounters:  07/08/13 200 lb (90.719 kg)  08/30/12 198 lb 12.8 oz (90.175 kg)  06/20/12 189 lb (85.73 kg)     Past Medical History  Diagnosis Date  . Arm numbness   . Hypertension   . Pedal edema   . Neck pain   . Hyperlipidemia   . Thyroid disease   . Carotid bruit   . Anemia   . Fibromyalgia   . Arterial fibromuscular  dysplasia   . Heart murmur   . Diabetes mellitus     border line  . Peripheral vascular disease   . CHF (congestive heart failure) 1997    pacemaker  . GERD (gastroesophageal reflux disease)   . Hypothyroidism   . Mitral valve prolapse     Current Outpatient Prescriptions  Medication Sig Dispense Refill  . albuterol (PROVENTIL) (2.5 MG/3ML) 0.083% nebulizer solution Take 2.5 mg by nebulization every 6 (six) hours as needed.      Marland Kitchen. amoxicillin (AMOXIL) 250 MG/5ML suspension continuous as needed.      Marland Kitchen. aspirin 325 MG tablet Take 325 mg by mouth daily.      . cetirizine (ZYRTEC) 10 MG tablet Take 10 mg by mouth at bedtime as needed. allergies      . hydrocodone-acetaminophen (LORCET-HD) 5-500 MG per capsule Take 1 capsule by mouth every 6 (six) hours as needed. fibromyalgia      . levothyroxine (SYNTHROID, LEVOTHROID) 100 MCG tablet Take 100 mcg by mouth daily.        Marland Kitchen. lovastatin (MEVACOR) 40 MG tablet Take 40 mg by mouth at bedtime.      . nitroGLYCERIN (NITROSTAT) 0.3 MG SL tablet Place 0.3 mg under the tongue every 5 (five) minutes as needed.        . NON FORMULARY Calcium, magnesium, zinc   One tablet daily      . pantoprazole (PROTONIX) 40 MG tablet Take 1 tablet (40  mg total) by mouth 2 (two) times daily.  62 tablet  11  . potassium chloride SA (K-DUR,KLOR-CON) 20 MEQ tablet Take 20 mEq by mouth daily.       . promethazine (PHENERGAN) 12.5 MG tablet 1-2 po q4-6 h prn nausea or vomiting  30 tablet  0  . valsartan-hydrochlorothiazide (DIOVAN-HCT) 320-25 MG per tablet Take 1 tablet by mouth daily.         No current facility-administered medications for this visit.    Allergies:   No Known Allergies  Social History:  The patient  reports that she has quit smoking. She has never used smokeless tobacco. She reports that she does not drink alcohol or use illicit drugs.   ROS:  Please see the history of present illness.   No palpitations, double vision, headache, nausea, vomiting,  difficulty with ambulation, chest pain, dyspnea, bleeding, or other complaints   All other systems reviewed and negative.   OBJECTIVE: VS:  BP 140/90  Pulse 62  Ht 5' 4.5" (1.638 m)  Wt 200 lb (90.719 kg)  BMI 33.81 kg/m2 Well nourished, well developed, in no acute distress, obese and appears younger than stated age HEENT: normal Neck: JVD flat. Carotid bruit absent  Cardiac:  normal S1, S2; RRR; no murmur Lungs:  clear to auscultation bilaterally, no wheezing, rhonchi or rales Abd: soft, nontender, no hepatomegaly Ext: Edema absent. Pulses 2+ and symmetric Skin: warm and dry Neuro:  CNs 2-12 intact, no focal abnormalities noted  EKG:  Normal sinus rhythm with AV sequential pacing and occasional atrial tracking. Prominent voltage consistent with LVH       Signed, Darci Needle III, MD 07/08/2013 10:27 AM

## 2013-07-12 ENCOUNTER — Ambulatory Visit (INDEPENDENT_AMBULATORY_CARE_PROVIDER_SITE_OTHER)
Admission: RE | Admit: 2013-07-12 | Discharge: 2013-07-12 | Disposition: A | Discharge status: Home / Self Care | Payer: BC Managed Care – PPO | Source: Ambulatory Visit | Attending: Interventional Cardiology | Admitting: Interventional Cardiology

## 2013-07-12 DIAGNOSIS — I6350 Cerebral infarction due to unspecified occlusion or stenosis of unspecified cerebral artery: Secondary | ICD-10-CM

## 2013-07-12 DIAGNOSIS — I639 Cerebral infarction, unspecified: Secondary | ICD-10-CM

## 2013-07-12 MED ORDER — IOHEXOL 300 MG/ML  SOLN
80.0000 mL | Freq: Once | INTRAMUSCULAR | Status: AC | PRN
  Administered 2013-07-12: 80 mL via INTRAVENOUS

## 2013-07-18 ENCOUNTER — Telehealth: Payer: Self-pay

## 2013-07-18 NOTE — Telephone Encounter (Signed)
lmom.  CT is normal

## 2013-07-18 NOTE — Telephone Encounter (Signed)
Message copied by Jarvis NewcomerPARRIS-GODLEY, Keanon Bevins S on Thu Jul 18, 2013  3:48 PM ------      Message from: Verdis PrimeSMITH, HENRY      Created: Sat Jul 13, 2013 12:36 PM       CT is normal ------

## 2013-08-05 ENCOUNTER — Ambulatory Visit (INDEPENDENT_AMBULATORY_CARE_PROVIDER_SITE_OTHER): Payer: BC Managed Care – PPO | Admitting: Gastroenterology

## 2013-08-05 ENCOUNTER — Encounter: Payer: Self-pay | Admitting: Gastroenterology

## 2013-08-05 VITALS — BP 120/84 | HR 81 | Temp 98.1°F | Wt 197.2 lb

## 2013-08-05 DIAGNOSIS — B9681 Helicobacter pylori [H. pylori] as the cause of diseases classified elsewhere: Secondary | ICD-10-CM

## 2013-08-05 DIAGNOSIS — K219 Gastro-esophageal reflux disease without esophagitis: Secondary | ICD-10-CM

## 2013-08-05 DIAGNOSIS — A048 Other specified bacterial intestinal infections: Secondary | ICD-10-CM

## 2013-08-05 DIAGNOSIS — K5900 Constipation, unspecified: Secondary | ICD-10-CM

## 2013-08-05 DIAGNOSIS — K2970 Gastritis, unspecified, without bleeding: Secondary | ICD-10-CM

## 2013-08-05 DIAGNOSIS — Z86010 Personal history of colonic polyps: Secondary | ICD-10-CM

## 2013-08-05 DIAGNOSIS — K2940 Chronic atrophic gastritis without bleeding: Secondary | ICD-10-CM

## 2013-08-05 MED ORDER — PANTOPRAZOLE SODIUM 40 MG PO TBEC: 40.0000 mg | DELAYED_RELEASE_TABLET | Freq: Two times a day (BID) | ORAL | Status: DC

## 2013-08-05 NOTE — Progress Notes (Signed)
Referring Provider: Earl ManyVyas, Chandra K, MD Primary Care Physician:  Earl ManyVyas, Chandra K, MD Primary GI: Dr. Darrick PennaFields   Chief Complaint  Patient presents with  . Follow-up    HPI:   Sylvia Anthony returns today in routine follow-up with hx of constipation, GERD, dysphagia, H.pylori gastritis. Next colonoscopy due in 2019. Last EGD in Sept 2013. Denies dysphagia, abdominal pain. Takes Protonix BID, controls GERD. Intermittent constipation but much improved from past. Usually will just use Mag Citrate for constipation. Usually once every 3-4 months. No hematochezia, melena.    Past Medical History  Diagnosis Date  . Arm numbness   . Hypertension   . Pedal edema   . Neck pain   . Hyperlipidemia   . Thyroid disease   . Carotid bruit   . Anemia   . Fibromyalgia   . Arterial fibromuscular dysplasia   . Heart murmur   . Diabetes mellitus     border line  . Peripheral vascular disease   . CHF (congestive heart failure) 1997    pacemaker  . GERD (gastroesophageal reflux disease)   . Hypothyroidism   . Mitral valve prolapse   . H. pylori infection Sept 2013    Amoxicillin, Biaxin    Past Surgical History  Procedure Laterality Date  . Abdominal hysterectomy      partial  . Appendectomy    . Colonoscopy  08/01/2006    3 mm descending colon polyp removed/8 mm sessile ascending colon polyp removed / 3-mm rectal  polyp removed /Rare sigmoid diverticulosis/ Moderate internal hemorrhoids.Advanced adenoma on colonoscopy in March 2008.  The polyp was anadenomatous polyp with a foci of high-grade dysplasia  . Colonoscopy  09/08/2008    SIMPLE ADENOMA/HYPERPLASTIC POLY/Multiple colon polyps (ascending, sigmoid, rectal)  Mild sigmoid colon diverticulosis./ Small internal hemorrhoids  . Cholecystectomy    . Ventricular cardiac pacemaker insertion      Mitral valve prolapse/bradycardia  . Tonsillectomy    . Right achilles tendon      X 2  . Cardiac catheterization      multiple  .  Growth removed from intestine      [facility]-as teenager, done through colonoscopy  . Colonoscopy with esophagogastroduodenoscopy (egd)  Sept. 30, 2013    AVW:UJWJSLF:Mild diverticulosis was noted in the sigmoid colon/The colon was otherwise normal/Small internal hemorrhoids/EGD:The mucosa of the esophagus appeared normal/Non-erosive gastritis (inflammation) was found; multiple bx/The duodenal mucosa showed no abnormalities. +H.pylori gastritis, treated with equivalent of prevpac    Current Outpatient Prescriptions  Medication Sig Dispense Refill  . albuterol (PROVENTIL) (2.5 MG/3ML) 0.083% nebulizer solution Take 2.5 mg by nebulization every 6 (six) hours as needed.      Marland Kitchen. amLODipine (NORVASC) 5 MG tablet Take 5 mg by mouth daily.      Marland Kitchen. aspirin 325 MG tablet Take 325 mg by mouth daily.      . cetirizine (ZYRTEC) 10 MG tablet Take 10 mg by mouth at bedtime as needed. allergies      . diclofenac (VOLTAREN) 75 MG EC tablet Take 75 mg by mouth as needed.      . dicyclomine (BENTYL) 10 MG capsule Take 10 mg by mouth 4 (four) times daily -  before meals and at bedtime.      . hydrocodone-acetaminophen (LORCET-HD) 5-500 MG per capsule Take 1 capsule by mouth every 6 (six) hours as needed. fibromyalgia      . levothyroxine (SYNTHROID, LEVOTHROID) 100 MCG tablet Take 100 mcg by mouth daily.        .Marland Kitchen  lovastatin (MEVACOR) 40 MG tablet Take 40 mg by mouth at bedtime.      . nitroGLYCERIN (NITROSTAT) 0.3 MG SL tablet Place 0.3 mg under the tongue every 5 (five) minutes as needed.        . NON FORMULARY Calcium, magnesium, zinc   One tablet daily      . pantoprazole (PROTONIX) 40 MG tablet Take 1 tablet (40 mg total) by mouth 2 (two) times daily.  62 tablet  11  . potassium chloride SA (K-DUR,KLOR-CON) 20 MEQ tablet Take 20 mEq by mouth daily.       . valsartan-hydrochlorothiazide (DIOVAN-HCT) 320-25 MG per tablet Take 1 tablet by mouth daily.         No current facility-administered medications for this visit.     Allergies as of 08/05/2013  . (No Known Allergies)    Family History  Problem Relation Age of Onset  . Hyperlipidemia Mother   . Hypertension Mother   . Heart disease Father   . Hypertension Father   . Hyperlipidemia Sister   . Hypertension Sister   . Hyperlipidemia Brother   . Hypertension Brother   . Colon polyps Brother   . Colon cancer Neg Hx     History   Social History  . Marital Status: Married    Spouse Name: N/A    Number of Children: 1  . Years of Education: N/A   Occupational History  . part-time hairdresser    Social History Main Topics  . Smoking status: Former Smoker -- 0.25 packs/day for 4 years  . Smokeless tobacco: Never Used     Comment: just in teens  . Alcohol Use: No  . Drug Use: No  . Sexual Activity: None   Other Topics Concern  . None   Social History Narrative  . None    Review of Systems: As mentioned in HPI.   Physical Exam: BP 120/84  Pulse 81  Temp(Src) 98.1 F (36.7 C) (Oral)  Wt 197 lb 3.2 oz (89.449 kg) General:   Alert and oriented. No distress noted. Pleasant and cooperative.  Head:  Normocephalic and atraumatic. Eyes:  Conjuctiva clear without scleral icterus. Mouth:  Oral mucosa pink and moist.  Heart:  S1, S2 present without murmurs, rubs, or gallops. Regular rate and rhythm. Abdomen:  +BS, soft, non-tender and non-distended. No rebound or guarding. No HSM or masses noted. Msk:  Symmetrical without gross deformities. Normal posture. Extremities:  Without edema. Neurologic:  Alert and  oriented x4;  grossly normal neurologically. Skin:  Intact without significant lesions or rashes. Psych:  Alert and cooperative. Normal mood and affect.

## 2013-08-05 NOTE — Assessment & Plan Note (Signed)
Colonoscopy 2019.  

## 2013-08-05 NOTE — Patient Instructions (Signed)
Decrease Protonix to once a day, 30 minutes prior to breakfast. I have sent refills to your pharmacy.  I would like you to complete the breath test in the near future to document that the bacteria is gone. You will need to be off Protonix for 2 weeks prior to doing this.   We will see you back in 1 year!

## 2013-08-05 NOTE — Assessment & Plan Note (Signed)
Controlled with BID Protonix. Discussed decreasing to once daily if tolerated. Refills provided. No dysphagia. Return in 1 year.

## 2013-08-05 NOTE — Assessment & Plan Note (Signed)
Rare. Once every few months, relieved with OTC agents (Mag Citrate). No need for prescription medication unless worsens. Continue high fiber diet, dietary measures.

## 2013-08-05 NOTE — Progress Notes (Signed)
cc'd to pcp 

## 2013-08-05 NOTE — Assessment & Plan Note (Signed)
Treated with generic prevpac in 2013. Asymptomatic. Discussed methods of documenting eradication to include stool antigen, breath test. Patient desires breath test. Will need to be off PPI X 2 weeks prior in order to avoid false negative results.

## 2013-08-20 ENCOUNTER — Other Ambulatory Visit: Payer: Self-pay | Admitting: Gastroenterology

## 2013-08-20 ENCOUNTER — Telehealth: Payer: Self-pay

## 2013-08-20 NOTE — Progress Notes (Signed)
REVIEWED.  

## 2013-08-20 NOTE — Telephone Encounter (Signed)
REVIEWED.  

## 2013-08-20 NOTE — Telephone Encounter (Signed)
HBT Is scheduled for Monday March 30th at 7:30 am and I have mailed Ms. Steines her instructions and she is aware

## 2013-08-20 NOTE — Telephone Encounter (Addendum)
Pt is calling to see about get the breath test schedule. She has been off her PPI since March 9. Please advise. Her call back number is [phone]

## 2013-08-21 ENCOUNTER — Encounter (HOSPITAL_COMMUNITY): Payer: Self-pay | Admitting: Pharmacy Technician

## 2013-08-22 ENCOUNTER — Telehealth: Payer: Self-pay | Admitting: Gastroenterology

## 2013-08-22 NOTE — Telephone Encounter (Signed)
HBT is cancelled for Monday Marcy 30 due to machine in Endo not working, we will R/S this appointment when machine is up and running

## 2013-08-26 ENCOUNTER — Encounter (HOSPITAL_COMMUNITY): Admission: RE | Payer: Self-pay | Source: Ambulatory Visit

## 2013-08-26 ENCOUNTER — Ambulatory Visit (HOSPITAL_COMMUNITY)
Admission: RE | Admit: 2013-08-26 | Payer: BC Managed Care – PPO | Source: Ambulatory Visit | Admitting: Gastroenterology

## 2013-08-26 SURGERY — BREATH TEST, FOR INTESTINAL BACTERIAL OVERGROWTH

## 2013-08-26 NOTE — OR Nursing (Signed)
Dr. Darrick PennaFields called and asked that I call patient and inform her to resume her PPI and to let her know that the Hydrogen breath test machine would be out being repaired for about 2 weeks. I spoke with patient and informed her of the above. Patient stated that she would be out of town from April 9th-15th and would have to reschedule it then.

## 2013-09-09 ENCOUNTER — Encounter: Payer: BC Managed Care – PPO | Admitting: Internal Medicine

## 2013-10-25 ENCOUNTER — Ambulatory Visit (INDEPENDENT_AMBULATORY_CARE_PROVIDER_SITE_OTHER): Payer: BC Managed Care – PPO | Admitting: Internal Medicine

## 2013-10-25 ENCOUNTER — Encounter: Payer: Self-pay | Admitting: Internal Medicine

## 2013-10-25 VITALS — BP 121/88 | HR 90 | Ht 64.5 in | Wt 194.8 lb

## 2013-10-25 DIAGNOSIS — Z950 Presence of cardiac pacemaker: Secondary | ICD-10-CM

## 2013-10-25 DIAGNOSIS — I495 Sick sinus syndrome: Secondary | ICD-10-CM

## 2013-10-25 DIAGNOSIS — I10 Essential (primary) hypertension: Secondary | ICD-10-CM

## 2013-10-25 LAB — MDC_IDC_ENUM_SESS_TYPE_INCLINIC
Brady Statistic AP VP Percent: 0 %
Brady Statistic AS VS Percent: 34 %
Lead Channel Impedance Value: 915 Ohm
Lead Channel Pacing Threshold Pulse Width: 0.4 ms
Lead Channel Sensing Intrinsic Amplitude: 22.4 mV
Lead Channel Setting Pacing Amplitude: 2 V
Lead Channel Setting Sensing Sensitivity: 5.6 mV
MDC IDC MSMT BATTERY IMPEDANCE: 659 Ohm
MDC IDC MSMT BATTERY REMAINING LONGEVITY: 65 mo
MDC IDC MSMT BATTERY VOLTAGE: 2.76 V
MDC IDC MSMT LEADCHNL RA IMPEDANCE VALUE: 855 Ohm
MDC IDC MSMT LEADCHNL RA PACING THRESHOLD AMPLITUDE: 0.75 V
MDC IDC MSMT LEADCHNL RA PACING THRESHOLD PULSEWIDTH: 0.4 ms
MDC IDC MSMT LEADCHNL RA SENSING INTR AMPL: 2.8 mV
MDC IDC MSMT LEADCHNL RV PACING THRESHOLD AMPLITUDE: 0.75 V
MDC IDC SESS DTM: 20150529104138
MDC IDC SET LEADCHNL RV PACING AMPLITUDE: 2.5 V
MDC IDC SET LEADCHNL RV PACING PULSEWIDTH: 0.4 ms
MDC IDC STAT BRADY AP VS PERCENT: 65 %
MDC IDC STAT BRADY AS VP PERCENT: 0 %

## 2013-10-25 NOTE — Progress Notes (Signed)
PCP:  Dr Sherril Croon Primary Cardiologist:  Dr Tora Kindred is a 58 y.o. 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  Her initial pacemaker was implanted at [facility] in 1997.  She had a generator change in 2008 by Dr Amil Amen. The patient reports doing very well since having a pacemaker implanted and remains very active.  She has had no syncope since her PPM was implanted.   Today, she  denies symptoms of palpitations, chest pain, shortness of breath, orthopnea, PND, lower extremity edema, dizziness, presyncope, syncope, or neurologic sequela.  The patientis tolerating medications without difficulties and is otherwise without complaint today.   Past Medical History  Diagnosis Date  . Arm numbness   . Hypertension   . Hyperlipidemia   . Hypothyroidism   . Carotid bruit   . Anemia   . Fibromyalgia   . Arterial fibromuscular dysplasia   . Rheumatic fever     age 29, was at [facility] for 17 weeks  . Glucose intolerance (impaired glucose tolerance)   . Peripheral vascular disease   . CHF (congestive heart failure) 1997    pacemaker  . GERD (gastroesophageal reflux disease)   . Mitral valve prolapse   . H. pylori infection Sept 2013    Amoxicillin, Biaxin   Past Surgical History  Procedure Laterality Date  . Abdominal hysterectomy      partial  . Appendectomy    . Colonoscopy  08/01/2006    3 mm descending colon polyp removed/8 mm sessile ascending colon polyp removed / 3-mm rectal  polyp removed /Rare sigmoid diverticulosis/ Moderate internal hemorrhoids.Advanced adenoma on colonoscopy in March 2008.  The polyp was anadenomatous polyp with a foci of high-grade dysplasia  . Colonoscopy  09/08/2008    SIMPLE ADENOMA/HYPERPLASTIC POLY/Multiple colon polyps (ascending, sigmoid, rectal)  Mild sigmoid colon diverticulosis./ Small internal hemorrhoids  . Cholecystectomy     . Pacemaker insertion  09/13/1995    for sick sinus syndome and syncope at [facility]  . Tonsillectomy    . Right achilles tendon      X 2  . Cardiac catheterization      multiple  . Growth removed from intestine      [facility]-as teenager, done through colonoscopy  . Colonoscopy with esophagogastroduodenoscopy (egd)  Sept. 30, 2013    TYO:MAYO diverticulosis was noted in the sigmoid colon/The colon was otherwise normal/Small internal hemorrhoids/EGD:The mucosa of the esophagus appeared normal/Non-erosive gastritis (inflammation) was found; multiple bx/The duodenal mucosa showed no abnormalities. +H.pylori gastritis, treated with equivalent of prevpac  . Pacemaker generator change  09/18/2006    generator change by Dr Amil Amen with a MDT Adapta L    History   Social History  . Marital Status: Married    Spouse Name: N/A    Number of Children: 1  . Years of Education: N/A   Occupational History  . part-time hairdresser    Social History Main Topics  . Smoking status: Former Smoker -- 0.25 packs/day for 4 years  . Smokeless tobacco: Never Used     Comment: just in teens  . Alcohol Use: No  . Drug Use: No  . Sexual Activity: Not on file   Other Topics Concern  . Not on file   Social History Narrative   Lives in [place] with spouse.  Son is healthy at age 28.   Disabled  Family History  Problem Relation Age of Onset  . Hyperlipidemia Mother   . Hypertension Mother   . Heart disease Father   . Hypertension Father   . Hyperlipidemia Sister   . Hypertension Sister   . Hyperlipidemia Brother   . Hypertension Brother   . Colon polyps Brother   . Colon cancer Neg Hx     No Known Allergies  Current Outpatient Prescriptions  Medication Sig Dispense Refill  . albuterol (PROVENTIL) (2.5 MG/3ML) 0.083% nebulizer solution Take 2.5 mg by nebulization every 6 (six) hours as needed.      Marland Kitchen. amLODipine (NORVASC) 5 MG tablet Take 5 mg by mouth daily.      Marland Kitchen.  aspirin 325 MG tablet Take 325 mg by mouth daily.      . cetirizine (ZYRTEC) 10 MG tablet Take 10 mg by mouth at bedtime as needed. allergies      . dicyclomine (BENTYL) 10 MG capsule Take 10 mg by mouth 2 (two) times daily.       . hydrocodone-acetaminophen (LORCET-HD) 5-500 MG per capsule Take 1 capsule by mouth every 6 (six) hours as needed. fibromyalgia      . levothyroxine (SYNTHROID, LEVOTHROID) 100 MCG tablet Take 100 mcg by mouth daily.        Marland Kitchen. lovastatin (MEVACOR) 40 MG tablet Take 40 mg by mouth at bedtime.      . nitroGLYCERIN (NITROSTAT) 0.3 MG SL tablet Place 0.3 mg under the tongue every 5 (five) minutes as needed.        . NON FORMULARY Calcium, magnesium, zinc   One tablet daily      . pantoprazole (PROTONIX) 40 MG tablet Take 1 tablet (40 mg total) by mouth 2 (two) times daily.  62 tablet  11  . potassium chloride SA (K-DUR,KLOR-CON) 20 MEQ tablet Take 20 mEq by mouth daily.       . valsartan-hydrochlorothiazide (DIOVAN-HCT) 320-25 MG per tablet Take 1 tablet by mouth daily.         No current facility-administered medications for this visit.    ROS- all systems are reviewed and negative except as per HPI  Physical Exam: Filed Vitals:   10/25/13 0958  BP: 121/88  Pulse: 90  Height: 5' 4.5" (1.638 m)  Weight: 194 lb 12.8 oz (88.361 kg)    GEN- The patient is well appearing, alert and oriented x 3 today.   Head- normocephalic, atraumatic Eyes-  Sclera clear, conjunctiva pink Ears- hearing intact Oropharynx- clear Neck- supple, no JVP Lymph- no cervical lymphadenopathy Lungs- Clear to ausculation bilaterally, normal work of breathing Chest- pacemaker pocket is well healed Heart- Regular rate and rhythm, no murmurs, rubs or gallops, PMI not laterally displaced GI- soft, NT, ND, + BS Extremities- no clubbing, cyanosis, or edema MS- no significant deformity or atrophy Skin- no rash or lesion Psych- euthymic mood, full affect Neuro- strength and sensation are  intact  Pacemaker interrogation- reviewed in detail today,  See PACEART report  Assessment and Plan:  1. Sick sinus syndrome Normal pacemaker function See Pace Art report No changes today  2. htn Stable No change required today  [facility]link [facility] Follow-up with Dr Katrinka BlazingSmith as scheduled

## 2013-10-25 NOTE — Patient Instructions (Signed)
Your physician recommends that you schedule a follow-up appointment in: 1 year with Dr. Johney Frame. You will receive a reminder letter in the mail in about 10 months reminding you to call and schedule your appointment. If you don't receive this letter, please contact our office. Your next device check with Carelink is on January 28, 2014. Your physician recommends that you continue on your current medications as directed. Please refer to the Current Medication list given to you today.

## 2014-01-12 ENCOUNTER — Encounter: Payer: Self-pay | Admitting: *Deleted

## 2014-01-28 ENCOUNTER — Encounter: Payer: BC Managed Care – PPO | Admitting: *Deleted

## 2014-01-28 ENCOUNTER — Telehealth: Payer: Self-pay | Admitting: Internal Medicine

## 2014-01-28 NOTE — Telephone Encounter (Signed)
Wants to know about her remote transmission.  No one has called about hers for today at 9:45

## 2014-02-10 NOTE — Telephone Encounter (Signed)
Spoke w/pt and per pt never received Carelink monitor. New carelink monitor ordered for pt. Pt scheduled for 03-07-14 in [place] office then will start Carelinks.

## 2014-03-07 ENCOUNTER — Encounter: Payer: Self-pay | Admitting: Internal Medicine

## 2014-03-07 ENCOUNTER — Ambulatory Visit (INDEPENDENT_AMBULATORY_CARE_PROVIDER_SITE_OTHER): Payer: BC Managed Care – PPO | Admitting: *Deleted

## 2014-03-07 DIAGNOSIS — I495 Sick sinus syndrome: Secondary | ICD-10-CM

## 2014-03-07 LAB — MDC_IDC_ENUM_SESS_TYPE_INCLINIC
Battery Impedance: 737 Ohm
Battery Remaining Longevity: 57 mo
Battery Voltage: 2.76 V
Brady Statistic AP VP Percent: 0 %
Brady Statistic AS VP Percent: 1 %
Brady Statistic AS VS Percent: 28 %
Date Time Interrogation Session: 20151009133715
Lead Channel Impedance Value: 953 Ohm
Lead Channel Pacing Threshold Amplitude: 0.75 V
Lead Channel Pacing Threshold Pulse Width: 0.4 ms
Lead Channel Sensing Intrinsic Amplitude: 2 mV
Lead Channel Sensing Intrinsic Amplitude: 22.4 mV
Lead Channel Setting Pacing Amplitude: 2 V
Lead Channel Setting Pacing Amplitude: 2.5 V
Lead Channel Setting Pacing Pulse Width: 0.4 ms
Lead Channel Setting Sensing Sensitivity: 5.6 mV
MDC IDC MSMT LEADCHNL RA PACING THRESHOLD PULSEWIDTH: 0.4 ms
MDC IDC MSMT LEADCHNL RV IMPEDANCE VALUE: 941 Ohm
MDC IDC MSMT LEADCHNL RV PACING THRESHOLD AMPLITUDE: 0.75 V
MDC IDC STAT BRADY AP VS PERCENT: 71 %

## 2014-03-07 NOTE — Progress Notes (Signed)
Pacemaker check in clinic. Normal device function. Battery longevity 4.5 years. No mode switch episodes or ventricular high rate episodes. No changes made. Carelink 06-09-14 and ROV in May with [facility].

## 2014-06-09 ENCOUNTER — Ambulatory Visit (INDEPENDENT_AMBULATORY_CARE_PROVIDER_SITE_OTHER): Payer: BLUE CROSS/BLUE SHIELD | Admitting: *Deleted

## 2014-06-09 DIAGNOSIS — Z950 Presence of cardiac pacemaker: Secondary | ICD-10-CM

## 2014-06-09 DIAGNOSIS — I495 Sick sinus syndrome: Secondary | ICD-10-CM | POA: Diagnosis not present

## 2014-06-10 LAB — MDC_IDC_ENUM_SESS_TYPE_REMOTE
Battery Remaining Longevity: 52 mo
Battery Voltage: 2.76 V
Brady Statistic AP VP Percent: 0 %
Brady Statistic AP VS Percent: 72 %
Brady Statistic AS VP Percent: 0 %
Brady Statistic AS VS Percent: 28 %
Date Time Interrogation Session: 20160111135405
Lead Channel Impedance Value: 873 Ohm
Lead Channel Pacing Threshold Amplitude: 0.625 V
Lead Channel Pacing Threshold Amplitude: 0.75 V
Lead Channel Pacing Threshold Pulse Width: 0.4 ms
Lead Channel Pacing Threshold Pulse Width: 0.4 ms
Lead Channel Sensing Intrinsic Amplitude: 16 mV
Lead Channel Setting Pacing Amplitude: 2 V
Lead Channel Setting Pacing Amplitude: 2.5 V
Lead Channel Setting Pacing Pulse Width: 0.4 ms
Lead Channel Setting Sensing Sensitivity: 5.6 mV
MDC IDC MSMT BATTERY IMPEDANCE: 866 Ohm
MDC IDC MSMT LEADCHNL RA SENSING INTR AMPL: 1.4 mV
MDC IDC MSMT LEADCHNL RV IMPEDANCE VALUE: 899 Ohm

## 2014-06-10 NOTE — Progress Notes (Signed)
Pacemaker remote check. Device function reviewed. Impedance, sensing, auto capture thresholds consistent with previous measurements. Histograms appropriate for patient and level of activity. All other diagnostic data reviewed and is appropriate and stable for patient. Real time/magnet EGM shows appropriate sensing and capture. No mode switch or ventricular high rate episodes. Estimated longevity 4.3878yrs. Due to see [facility] in 33mo.

## 2014-06-13 ENCOUNTER — Encounter: Payer: Self-pay | Admitting: Cardiology

## 2014-06-18 ENCOUNTER — Encounter: Payer: Self-pay | Admitting: Internal Medicine

## 2014-07-28 ENCOUNTER — Encounter: Payer: Self-pay | Admitting: Gastroenterology

## 2014-09-01 ENCOUNTER — Encounter: Payer: Self-pay | Admitting: Interventional Cardiology

## 2014-09-03 ENCOUNTER — Other Ambulatory Visit: Payer: Self-pay

## 2014-09-03 MED ORDER — PANTOPRAZOLE SODIUM 40 MG PO TBEC: 40.0000 mg | DELAYED_RELEASE_TABLET | Freq: Two times a day (BID) | ORAL | Status: AC

## 2014-09-15 ENCOUNTER — Ambulatory Visit (INDEPENDENT_AMBULATORY_CARE_PROVIDER_SITE_OTHER): Payer: BLUE CROSS/BLUE SHIELD | Admitting: Interventional Cardiology

## 2014-09-15 ENCOUNTER — Encounter: Payer: Self-pay | Admitting: Interventional Cardiology

## 2014-09-15 VITALS — BP 148/82 | HR 63 | Ht 64.5 in | Wt 198.0 lb

## 2014-09-15 DIAGNOSIS — I10 Essential (primary) hypertension: Secondary | ICD-10-CM

## 2014-09-15 DIAGNOSIS — I119 Hypertensive heart disease without heart failure: Secondary | ICD-10-CM | POA: Diagnosis not present

## 2014-09-15 DIAGNOSIS — I495 Sick sinus syndrome: Secondary | ICD-10-CM

## 2014-09-15 DIAGNOSIS — Z950 Presence of cardiac pacemaker: Secondary | ICD-10-CM

## 2014-09-15 NOTE — Progress Notes (Signed)
Cardiology Office Note   Date:  09/15/2014   ID:  Sylvia Anthony, DOB 03/25/1956, MRN [id number]  PCP:  Sylvia SpeckingVYAS,DHRUV B., MD  Cardiologist:   Lesleigh NoeSMITH III,Reniah Cottingham W, MD   Chief Complaint  Patient presents with  . Bradycardia    SSS with pacer      History of Present Illness: Sylvia Anthony is a 59 y.o. female who presents for sinus node dysfunction, DDD pacemaker (long-standing) 1996 and generator change 2008; hypertension; and hypertensive heart disease..  Sylvia Anthony is doing relatively well. She is under stress because her husband has an alcohol drinking problem. She denies dyspnea, chest pain, but does have mild lower extremity edema. She denies orthopnea. She takes her medications as prescribed. She denies angina.     Past Medical History  Diagnosis Date  . Arm numbness   . Hypertension   . Hyperlipidemia   . Hypothyroidism   . Carotid bruit   . Anemia   . Fibromyalgia   . Arterial fibromuscular dysplasia   . Rheumatic fever     age 89, was [facility]  . Glucose intolerance (impaired glucose tolerance)   . Peripheral vascular disease   . CHF (congestive heart failure) 1997    pacemaker  . GERD (gastroesophageal reflux disease)   . Mitral valve prolapse   . H. pylori infection Sept 2013    Amoxicillin, Biaxin  . SSS (sick sinus syndrome)     Past Surgical History  Procedure Laterality Date  . Abdominal hysterectomy      partial  . Appendectomy    . Colonoscopy  08/01/2006    3 mm descending colon polyp removed/8 mm sessile ascending colon polyp removed / 3-mm rectal  polyp removed /Rare sigmoid diverticulosis/ Moderate internal hemorrhoids.Advanced adenoma on colonoscopy in March 2008.  The polyp was anadenomatous polyp with a foci of high-grade dysplasia  . Colonoscopy  09/08/2008    SIMPLE ADENOMA/HYPERPLASTIC POLY/Multiple colon polyps (ascending, sigmoid, rectal)  Mild sigmoid colon diverticulosis./ Small internal hemorrhoids   . Cholecystectomy    . Pacemaker insertion  09/13/1995    [facility] sick sinus syndome and [facility]  . Tonsillectomy    . Right achilles tendon      X 2  . Cardiac catheterization      multiple  . Growth removed from intestine      [facility]-as teenager, done through colonoscopy  . Colonoscopy with esophagogastroduodenoscopy (egd)  Sept. 30, 2013    WUJ:WJXBSLF:Mild diverticulosis was noted in the sigmoid colon/The colon was otherwise normal/Small internal hemorrhoids/EGD:The mucosa of the esophagus appeared normal/Non-erosive gastritis (inflammation) was found; multiple bx/The duodenal mucosa showed no abnormalities. +H.pylori gastritis, treated with equivalent of prevpac  . Pacemaker generator change  09/18/2006    generator change by Dr Amil AmenEdmunds with a MDT Adapta L     Current Outpatient Prescriptions  Medication Sig Dispense Refill  . albuterol (PROVENTIL) (2.5 MG/3ML) 0.083% nebulizer solution Take 2.5 mg by nebulization every 6 (six) hours as needed.    Marland Kitchen. amLODipine (NORVASC) 5 MG tablet Take 5 mg by mouth daily.    Marland Kitchen. amoxicillin (AMOXIL) 250 MG/5ML suspension Take 250 mg by mouth as needed. When she go for dental cleaning    . aspirin 325 MG tablet Take 325 mg by mouth daily.    Marland Kitchen. CALCIUM-MAGNESIUM-ZINC PO Take by mouth daily.    . cetirizine (ZYRTEC) 10 MG tablet Take 10 mg by mouth at bedtime as  needed. allergies    . dicyclomine (BENTYL) 10 MG capsule Take 20 mg by mouth daily.     . hydrocodone-acetaminophen (LORCET-HD) 5-500 MG per capsule Take 1 capsule by mouth every 6 (six) hours as needed. fibromyalgia    . levothyroxine (SYNTHROID, LEVOTHROID) 100 MCG tablet Take 100 mcg by mouth daily.      Marland Kitchen lovastatin (MEVACOR) 40 MG tablet Take 40 mg by mouth at bedtime.    . mometasone (NASONEX) 50 MCG/ACT nasal spray Place 2 sprays into the nose as needed.    . nitroGLYCERIN (NITROSTAT) 0.3 MG SL tablet Place 0.3 mg under the tongue every 5 (five) minutes as needed.      .  NON FORMULARY Calcium, magnesium, zinc   One tablet daily    . pantoprazole (PROTONIX) 40 MG tablet Take 1 tablet (40 mg total) by mouth 2 (two) times daily before a meal. 60 tablet 5  . potassium chloride SA (K-DUR,KLOR-CON) 20 MEQ tablet Take 20 mEq by mouth daily.     . simvastatin (ZOCOR) 40 MG tablet Take 40 mg by mouth daily.    . valsartan-hydrochlorothiazide (DIOVAN-HCT) 320-25 MG per tablet Take 1 tablet by mouth daily.       No current facility-administered medications for this visit.    Allergies:   Review of patient's allergies indicates no known allergies.    Social History:  The patient  reports that she has quit smoking. She has never used smokeless tobacco. She reports that she does not drink alcohol or use illicit drugs.   Family History:  The patient's family history includes Colon polyps in her brother; Heart disease in her father; Hyperlipidemia in her brother, mother, and sister; Hypertension in her brother, father, mother, and sister. There is no history of Colon cancer.    ROS:  Please see the history of present illness.   Otherwise, review of systems are positive for stress, snoring, and left-sided weakness..   All other systems are reviewed and negative.    PHYSICAL EXAM: VS:  BP 148/82 mmHg  Pulse 63  Ht 5' 4.5" (1.638 m)  Wt 198 lb (89.812 kg)  BMI 33.47 kg/m2 , BMI Body mass index is 33.47 kg/(m^2). GEN: Well nourished, well developed, in no acute distress HEENT: normal Neck: no JVD, carotid bruits, or masses Cardiac: RRR; no murmurs, rubs, or gallops,no edema  Respiratory:  clear to auscultation bilaterally, normal work of breathing GI: soft, nontender, nondistended, + BS MS: no deformity or atrophy Skin: warm and dry, no rash Neuro:  Strength and sensation are intact Psych: euthymic mood, full affect   EKG:  EKG is ordered today. The ekg ordered today demonstrates atrial pacing with prominent voltage consistent with LVH and secondary T-wave  abnormality.   Recent Labs: No results found for requested labs within last 365 days.    Lipid Panel No results found for: CHOL, TRIG, HDL, CHOLHDL, VLDL, LDLCALC, LDLDIRECT    Wt Readings from Last 3 Encounters:  09/15/14 198 lb (89.812 kg)  10/25/13 194 lb 12.8 oz (88.361 kg)  08/05/13 197 lb 3.2 oz (89.449 kg)      Other studies Reviewed: Additional studies/ records that were reviewed today include: Reviewed neurological date and there is no evidence of stroke. Review of the above records demonstrates:    ASSESSMENT AND PLAN:  Hypertensive heart disease without heart failure - LVH on EKG raises concern of undetected poor blood pressure control. Will consider intensification of medical therapy by adding a beta blocker  if LVH is documented on echo. Plan: 2D Echocardiogram without contrast  Pacemaker: Normal function  Sick sinus syndrome: Pacemaker therapy since 1996  Essential hypertension: Blood pressure today is 150/90. We will likely need intensification of therapy. For the time being I discussed salt restriction and aerobic exercise.      Current medicines are reviewed at length with the patient today.  The patient does not have concerns regarding medicines.  The following changes have been made:  no change  Labs/ tests ordered today include:   Orders Placed This Encounter  Procedures  . 2D Echocardiogram without contrast     Disposition:   FU with Mendel Ryder for Htn f/u  Signed, Lesleigh Noe, MD  09/15/2014 9:23 AM    [facility] [address] [place], [place], [place]  [address] Phone: [phone]; Fax: [phone]

## 2014-09-15 NOTE — Patient Instructions (Signed)
Medication Instructions:  None   Labwork: None   Testing/Procedures: Your physician has requested that you have an echocardiogram. Echocardiography is a painless test that uses sound waves to create images of your heart. It provides your doctor with information about the size and shape of your heart and how well your heart's chambers and valves are working. This procedure takes approximately one hour. There are no restrictions for this procedure.  Follow-Up: Your physician wants you to follow-up in: 6 months with Dr.Smith You will receive a reminder letter in the mail two months in advance. If you don't receive a letter, please call our office to schedule the follow-up appointment.   Any Other Special Instructions Will Be Listed Below (If Applicable). Please have Dr.Vyas office fax us a copy of your most recent lab work Fax # [phone] attn: UJ:WJXBJr:Smith

## 2014-09-17 ENCOUNTER — Ambulatory Visit: Payer: BLUE CROSS/BLUE SHIELD | Admitting: Interventional Cardiology

## 2014-09-17 ENCOUNTER — Encounter: Payer: Self-pay | Admitting: Interventional Cardiology

## 2014-09-18 ENCOUNTER — Telehealth: Payer: Self-pay

## 2014-09-18 NOTE — Telephone Encounter (Signed)
-----   Message from Lyn RecordsHenry W Smith, MD sent at 09/17/2014  5:53 PM EDT ----- Let her know that I review the laboratory data from Dr. Sherril CroonVyas. Her cholesterol is too high. They need to be more aggressive with treatment.

## 2014-09-18 NOTE — Telephone Encounter (Signed)
[email or web address].called to give pt Dr.Smith' recommendtions on her labs drawn by her pcp

## 2014-09-22 ENCOUNTER — Ambulatory Visit (HOSPITAL_COMMUNITY): Payer: BLUE CROSS/BLUE SHIELD | Attending: Cardiology | Admitting: Cardiology

## 2014-09-22 DIAGNOSIS — I119 Hypertensive heart disease without heart failure: Secondary | ICD-10-CM

## 2014-09-22 NOTE — Progress Notes (Signed)
Echo performed. 

## 2014-09-23 ENCOUNTER — Other Ambulatory Visit: Payer: Self-pay

## 2014-09-23 ENCOUNTER — Encounter: Payer: Self-pay | Admitting: Gastroenterology

## 2014-09-23 ENCOUNTER — Ambulatory Visit (INDEPENDENT_AMBULATORY_CARE_PROVIDER_SITE_OTHER): Payer: BLUE CROSS/BLUE SHIELD | Admitting: Gastroenterology

## 2014-09-23 VITALS — BP 130/86 | HR 70 | Temp 97.1°F | Ht 64.0 in | Wt 197.6 lb

## 2014-09-23 DIAGNOSIS — R1319 Other dysphagia: Secondary | ICD-10-CM

## 2014-09-23 DIAGNOSIS — R1310 Dysphagia, unspecified: Secondary | ICD-10-CM

## 2014-09-23 DIAGNOSIS — R1314 Dysphagia, pharyngoesophageal phase: Secondary | ICD-10-CM

## 2014-09-23 DIAGNOSIS — K219 Gastro-esophageal reflux disease without esophagitis: Secondary | ICD-10-CM | POA: Diagnosis not present

## 2014-09-23 NOTE — Progress Notes (Signed)
Referring Provider: Ignatius Specking., MD Primary Care Physician:  Ignatius Specking., MD  Primary GI: Dr. [name]   Chief Complaint  Patient presents with  . Follow-up    HPI:   Sylvia Anthony is a 59 y.o. female presenting today with a history of constipation, GERD, dysphagia, H.pylori gastritis. Next colonoscopy due in 2018. Last EGD in Sept 2013. Here for routine 1 year follow-up. Was supposed to have urea breath test, but this was inadvertently scheduled as a hydrogen breath test. This was never completed as the breath test machine was down. She will still need documentation of eradication either via UREA breath test of stool antigen.   Reports intermittent recurrent solid food and pill dysphagia. Notes with lettuce, seafood. Notes associated reflux. Protonix BID. Nocturnal reflux. No abdominal pain. No N/V. Chronic intermittent constipation, managed without prescriptive agents. No rectal bleeding.   Past Medical History  Diagnosis Date  . Arm numbness   . Hypertension   . Hyperlipidemia   . Hypothyroidism   . Carotid bruit   . Anemia   . Fibromyalgia   . Arterial fibromuscular dysplasia   . Rheumatic fever     age 71, was at [facility] for [facility] weeks  . Glucose intolerance (impaired glucose tolerance)   . Peripheral vascular disease   . CHF (congestive heart failure) 1997    pacemaker  . GERD (gastroesophageal reflux disease)   . Mitral valve prolapse   . H. pylori infection Sept 2013    Amoxicillin, Biaxin  . SSS (sick sinus syndrome)     Past Surgical History  Procedure Laterality Date  . Abdominal hysterectomy      partial  . Appendectomy    . Colonoscopy  08/01/2006    3 mm descending colon polyp removed/8 mm sessile ascending colon polyp removed / 3-mm rectal  polyp removed /Rare sigmoid diverticulosis/ Moderate internal hemorrhoids.Advanced adenoma on colonoscopy in March 2008.  The polyp was anadenomatous polyp with a foci of high-grade dysplasia  .  Colonoscopy  09/08/2008    SIMPLE ADENOMA/HYPERPLASTIC POLY/Multiple colon polyps (ascending, sigmoid, rectal)  Mild sigmoid colon diverticulosis./ Small internal hemorrhoids  . Cholecystectomy    . Pacemaker insertion  09/13/1995    for sick sinus syndome and syncope at [facility]  . Tonsillectomy    . Right achilles tendon      X 2  . Cardiac catheterization      multiple  . Growth removed from intestine      [facility]-as teenager, done through colonoscopy  . Colonoscopy with esophagogastroduodenoscopy (egd)  Sept. 30, 2013    WUJ:WJXB diverticulosis was noted in the sigmoid colon/The colon was otherwise normal/Small internal hemorrhoids/EGD:The mucosa of the esophagus appeared normal/Non-erosive gastritis (inflammation) was found; multiple bx/The duodenal mucosa showed no abnormalities. +H.pylori gastritis, treated with equivalent of prevpac. SAVARY DILATION  . Pacemaker generator change  09/18/2006    generator change by Dr Amil Amen with a MDT Adapta L    Current Outpatient Prescriptions  Medication Sig Dispense Refill  . albuterol (PROVENTIL) (2.5 MG/3ML) 0.083% nebulizer solution Take 2.5 mg by nebulization every 6 (six) hours as needed.    Marland Kitchen amLODipine (NORVASC) 5 MG tablet Take 5 mg by mouth daily.    Marland Kitchen amoxicillin (AMOXIL) 250 MG/5ML suspension Take 250 mg by mouth as needed. When she go for dental cleaning    . aspirin 325 MG tablet Take 325 mg by mouth daily.    Marland Kitchen CALCIUM-MAGNESIUM-ZINC PO Take by  mouth daily.    . cetirizine (ZYRTEC) 10 MG tablet Take 10 mg by mouth at bedtime as needed. allergies    . cloNIDine (CATAPRES) 0.1 MG tablet Take 0.1 mg by mouth daily.    . Coenzyme Q10 (CO Q-10) 50 MG CAPS Take 1 capsule by mouth daily.    Marland Kitchen. dicyclomine (BENTYL) 10 MG capsule Take 20 mg by mouth daily.     Marland Kitchen. gabapentin (NEURONTIN) 100 MG capsule Take 100 mg by mouth 2 (two) times daily.    . hydrocodone-acetaminophen (LORCET-HD) 5-500 MG per capsule Take 1 capsule by mouth  every 6 (six) hours as needed. fibromyalgia    . levothyroxine (SYNTHROID, LEVOTHROID) 100 MCG tablet Take 100 mcg by mouth daily.      Marland Kitchen. lovastatin (MEVACOR) 40 MG tablet Take 40 mg by mouth at bedtime.    . pantoprazole (PROTONIX) 40 MG tablet Take 1 tablet (40 mg total) by mouth 2 (two) times daily before a meal. 60 tablet 5  . potassium chloride SA (K-DUR,KLOR-CON) 20 MEQ tablet Take 20 mEq by mouth daily.     . simvastatin (ZOCOR) 40 MG tablet Take 40 mg by mouth daily. Just got prescription today(09/23/2014)    . valsartan-hydrochlorothiazide (DIOVAN-HCT) 320-25 MG per tablet Take 1 tablet by mouth daily.       No current facility-administered medications for this visit.    Allergies as of 09/23/2014  . (No Known Allergies)    Family History  Problem Relation Age of Onset  . Hyperlipidemia Mother   . Hypertension Mother   . Heart disease Father   . Hypertension Father   . Hyperlipidemia Sister   . Hypertension Sister   . Hyperlipidemia Brother   . Hypertension Brother   . Colon polyps Brother   . Colon cancer Neg Hx     History   Social History  . Marital Status: Married    Spouse Name: N/A  . Number of Children: 1  . Years of Education: N/A   Occupational History  . part-time hairdresser    Social History Main Topics  . Smoking status: Former Smoker -- 0.25 packs/day for 4 years  . Smokeless tobacco: Never Used     Comment: just in teens  . Alcohol Use: No  . Drug Use: No  . Sexual Activity: Not on file   Other Topics Concern  . None   Social History Narrative   [place] in [place] with [place].  Son is healthy at age 61.   Disabled          Review of Systems: Gen: Denies fever, chills, anorexia. Denies fatigue, weakness, weight loss.  CV: Denies chest pain, palpitations, syncope, peripheral edema, and claudication. Resp: Denies dyspnea at rest, cough, wheezing, coughing up blood, and pleurisy. GI: Denies vomiting blood, jaundice, and fecal incontinence.    Denies dysphagia or odynophagia. Derm: Denies rash, itching, dry skin Psych: Denies depression, anxiety, memory loss, confusion. No homicidal or suicidal ideation.  Heme: Denies bruising, bleeding, and enlarged lymph nodes.  Physical Exam: BP 130/86 mmHg  Pulse 70  Temp(Src) 97.1 F (36.2 C)  Ht 5\' 4"  (1.626 m)  Wt 197 lb 9.6 oz (89.631 kg)  BMI 33.90 kg/m2 General:   Alert and oriented. No distress noted. Pleasant and cooperative.  Head:  Normocephalic and atraumatic. Eyes:  Conjuctiva clear without scleral icterus. Mouth:  Oral mucosa pink and moist. Good dentition. No lesions. Neck:  Supple, without mass or thyromegaly. Heart:  S1, S2 present without  murmurs, rubs, or gallops. Regular rate and rhythm. Abdomen:  +BS, soft, non-tender and non-distended. No rebound or guarding. No HSM or masses noted. Msk:  Symmetrical without gross deformities. Normal posture. Pulses:  2+ DP noted bilaterally Extremities:  Without edema. Neurologic:  Alert and  oriented x4;  grossly normal neurologically. Skin:  Intact without significant lesions or rashes. Cervical Nodes:  No significant cervical adenopathy. Psych:  Alert and cooperative. Normal mood and affect.

## 2014-09-23 NOTE — Patient Instructions (Addendum)
Continue Protonix twice a day.   We have scheduled you for an upper endoscopy with dilation with Dr. Darrick PennaFields.   Please review the attached handout regarding reflux and foods to avoid.   Food Choices for Gastroesophageal Reflux Disease When you have gastroesophageal reflux disease (GERD), the foods you eat and your eating habits are very important. Choosing the right foods can help ease the discomfort of GERD. WHAT GENERAL GUIDELINES DO I NEED TO FOLLOW?  Choose fruits, vegetables, whole grains, low-fat dairy products, and low-fat meat, fish, and poultry.  Limit fats such as oils, salad dressings, butter, nuts, and avocado.  Keep a food diary to identify foods that cause symptoms.  Avoid foods that cause reflux. These may be different for different people.  Eat frequent small meals instead of three large meals each day.  Eat your meals slowly, in a relaxed setting.  Limit fried foods.  Cook foods using methods other than frying.  Avoid drinking alcohol.  Avoid drinking large amounts of liquids with your meals.  Avoid bending over or lying down until 2-3 hours after eating. WHAT FOODS ARE NOT RECOMMENDED? The following are some foods and drinks that may worsen your symptoms: Vegetables Tomatoes. Tomato juice. Tomato and spaghetti sauce. Chili peppers. Onion and garlic. Horseradish. Fruits Oranges, grapefruit, and lemon (fruit and juice). Meats High-fat meats, fish, and poultry. This includes hot dogs, ribs, ham, sausage, salami, and bacon. Dairy Whole milk and chocolate milk. Sour cream. Cream. Butter. Ice cream. Cream cheese.  Beverages Coffee and tea, with or without caffeine. Carbonated beverages or energy drinks. Condiments Hot sauce. Barbecue sauce.  Sweets/Desserts Chocolate and cocoa. Donuts. Peppermint and spearmint. Fats and Oils High-fat foods, including [place] fries and potato chips. Other Vinegar. Strong spices, such as black pepper, white pepper, red  pepper, cayenne, curry powder, cloves, ginger, and chili powder. The items listed above may not be a complete list of foods and beverages to avoid. Contact your dietitian for more information. Document Released: 05/16/2005 Document Revised: 05/21/2013 Document Reviewed: 03/20/2013 [facility] Patient Information 2015 [employer] [place]. This information is not intended to replace advice given to you by your health care provider. Make sure you discuss any questions you have with your health care provider.

## 2014-09-25 ENCOUNTER — Encounter: Payer: Self-pay | Admitting: Gastroenterology

## 2014-09-25 ENCOUNTER — Telehealth: Payer: Self-pay

## 2014-09-25 NOTE — Telephone Encounter (Signed)
Pt aware of echo results with verbal understanding. The echocardiogram does not demonstrate any evidence of hypertrophy. Heart pumping ability is normal. Continue her current medical regimen. If blood pressure continues to run high, please notify us. Heart goal is less than 140/90 mmHg Pt verbalized understanding.

## 2014-09-25 NOTE — Progress Notes (Signed)
CC'ED TO PCP 

## 2014-09-25 NOTE — Telephone Encounter (Signed)
-----   Message from Lyn RecordsHenry W Smith, MD sent at 09/22/2014  5:18 PM EDT ----- The echocardiogram does not demonstrate any evidence of hypertrophy. Heart pumping ability is normal. Continue her current medical regimen. If blood pressure continues to run high, please notify us. Heart goal is less than 140/90 mmHg

## 2014-09-25 NOTE — Assessment & Plan Note (Signed)
59 year old female with recurrent solid food and pill dysphagia with persistent reflux despite Protonix BID. Last EGD in Sept 2013 with H.pylori gastritis and empiric dilation. Likely persistent GERD secondary to dietary and behavior; would recommend repeat EGD to assess for esophagitis, stricture, web, or ring. Doubt malignancy. Continue Protonix BID for now.  Proceed with upper endoscopy and dilation in the near future with Dr. Darrick PennaFields. The risks, benefits, and alternatives have been discussed in detail with patient. They have stated understanding and desire to proceed.

## 2014-10-07 ENCOUNTER — Encounter (HOSPITAL_COMMUNITY): Payer: Self-pay | Admitting: *Deleted

## 2014-10-07 ENCOUNTER — Encounter (HOSPITAL_COMMUNITY)
Admission: RE | Disposition: A | Discharge status: Home / Self Care | Payer: Self-pay | Source: Ambulatory Visit | Attending: Gastroenterology

## 2014-10-07 ENCOUNTER — Ambulatory Visit (HOSPITAL_COMMUNITY)
Admission: RE | Admit: 2014-10-07 | Discharge: 2014-10-07 | Disposition: A | Discharge status: Home / Self Care | Payer: BLUE CROSS/BLUE SHIELD | Source: Ambulatory Visit | Attending: Gastroenterology | Admitting: Gastroenterology

## 2014-10-07 DIAGNOSIS — I341 Nonrheumatic mitral (valve) prolapse: Secondary | ICD-10-CM | POA: Insufficient documentation

## 2014-10-07 DIAGNOSIS — Z79899 Other long term (current) drug therapy: Secondary | ICD-10-CM | POA: Insufficient documentation

## 2014-10-07 DIAGNOSIS — Z87891 Personal history of nicotine dependence: Secondary | ICD-10-CM | POA: Insufficient documentation

## 2014-10-07 DIAGNOSIS — K219 Gastro-esophageal reflux disease without esophagitis: Secondary | ICD-10-CM | POA: Diagnosis not present

## 2014-10-07 DIAGNOSIS — E039 Hypothyroidism, unspecified: Secondary | ICD-10-CM | POA: Diagnosis not present

## 2014-10-07 DIAGNOSIS — M797 Fibromyalgia: Secondary | ICD-10-CM | POA: Diagnosis not present

## 2014-10-07 DIAGNOSIS — Z792 Long term (current) use of antibiotics: Secondary | ICD-10-CM | POA: Insufficient documentation

## 2014-10-07 DIAGNOSIS — R1314 Dysphagia, pharyngoesophageal phase: Secondary | ICD-10-CM | POA: Insufficient documentation

## 2014-10-07 DIAGNOSIS — E785 Hyperlipidemia, unspecified: Secondary | ICD-10-CM | POA: Insufficient documentation

## 2014-10-07 DIAGNOSIS — Z79891 Long term (current) use of opiate analgesic: Secondary | ICD-10-CM | POA: Diagnosis not present

## 2014-10-07 DIAGNOSIS — K2940 Chronic atrophic gastritis without bleeding: Secondary | ICD-10-CM | POA: Diagnosis not present

## 2014-10-07 DIAGNOSIS — Z7982 Long term (current) use of aspirin: Secondary | ICD-10-CM | POA: Diagnosis not present

## 2014-10-07 DIAGNOSIS — I10 Essential (primary) hypertension: Secondary | ICD-10-CM | POA: Diagnosis not present

## 2014-10-07 DIAGNOSIS — I739 Peripheral vascular disease, unspecified: Secondary | ICD-10-CM | POA: Insufficient documentation

## 2014-10-07 DIAGNOSIS — Z791 Long term (current) use of non-steroidal anti-inflammatories (NSAID): Secondary | ICD-10-CM | POA: Insufficient documentation

## 2014-10-07 DIAGNOSIS — I509 Heart failure, unspecified: Secondary | ICD-10-CM | POA: Diagnosis not present

## 2014-10-07 DIAGNOSIS — Z950 Presence of cardiac pacemaker: Secondary | ICD-10-CM | POA: Diagnosis not present

## 2014-10-07 DIAGNOSIS — Z9049 Acquired absence of other specified parts of digestive tract: Secondary | ICD-10-CM | POA: Diagnosis not present

## 2014-10-07 DIAGNOSIS — R1310 Dysphagia, unspecified: Secondary | ICD-10-CM | POA: Diagnosis present

## 2014-10-07 HISTORY — PX: ESOPHAGOGASTRODUODENOSCOPY: SHX5428

## 2014-10-07 HISTORY — PX: ESOPHAGEAL DILATION: SHX303

## 2014-10-07 SURGERY — EGD (ESOPHAGOGASTRODUODENOSCOPY)
Anesthesia: Moderate Sedation

## 2014-10-07 MED ORDER — LIDOCAINE VISCOUS 2 % MT SOLN
OROMUCOSAL | Status: AC
  Filled 2014-10-07: qty 15

## 2014-10-07 MED ORDER — MIDAZOLAM HCL 5 MG/5ML IJ SOLN
INTRAMUSCULAR | Status: DC | PRN
  Administered 2014-10-07: 2 mg via INTRAVENOUS
  Administered 2014-10-07: 1 mg via INTRAVENOUS
  Administered 2014-10-07: 2 mg via INTRAVENOUS

## 2014-10-07 MED ORDER — MIDAZOLAM HCL 5 MG/5ML IJ SOLN
INTRAMUSCULAR | Status: AC
  Filled 2014-10-07: qty 10

## 2014-10-07 MED ORDER — SODIUM CHLORIDE 0.9 % IV SOLN
INTRAVENOUS | Status: DC
  Administered 2014-10-07: 10:00:00 via INTRAVENOUS

## 2014-10-07 MED ORDER — STERILE WATER FOR IRRIGATION IR SOLN
Status: DC | PRN
  Administered 2014-10-07: 11:00:00

## 2014-10-07 MED ORDER — MEPERIDINE HCL 100 MG/ML IJ SOLN
INTRAMUSCULAR | Status: DC | PRN
  Administered 2014-10-07 (×2): 25 mg via INTRAVENOUS

## 2014-10-07 MED ORDER — MINERAL OIL PO OIL
TOPICAL_OIL | ORAL | Status: AC
  Filled 2014-10-07: qty 30

## 2014-10-07 MED ORDER — LIDOCAINE VISCOUS 2 % MT SOLN
OROMUCOSAL | Status: DC | PRN
  Administered 2014-10-07: 4 mL via OROMUCOSAL

## 2014-10-07 MED ORDER — MEPERIDINE HCL 100 MG/ML IJ SOLN
INTRAMUSCULAR | Status: AC
  Filled 2014-10-07: qty 2

## 2014-10-07 NOTE — Discharge Instructions (Signed)
I dilated your esophagus. I DID NOT SEE A DEFINITE NARROWING IN YOUR esophagus. You have gastritis. I biopsied your esophagus and stomach.   AVOID TRIGGERS FOR REFLUX. SEE INFO BELOW.  CONTINUE PROTONIX. TAKE 30 MINUTES PRIOR TO MEALS TWICE DAILY.  YOUR BIOPSY RESULTS WILL BE AVAILABLE IN MY CHART AFTER MAY 12 AND MY OFFICE WILL CONTACT YOU IN 10-14 DAYS WITH YOUR RESULTS.   FOLLOW UP IN 4 MOS. If your difficulty swallowing is not better in 2 mos, call the office.   UPPER ENDOSCOPY AFTER CARE Read the instructions outlined below and refer to this sheet in the next week. These discharge instructions provide you with general information on caring for yourself after you leave the hospital. While your treatment has been planned according to the most current medical practices available, unavoidable complications occasionally occur. If you have any problems or questions after discharge, call DR. Lucyann Romano, [phone].  ACTIVITY  You may resume your regular activity, but move at a slower pace for the next 24 hours.   Take frequent rest periods for the next 24 hours.   Walking will help get rid of the air and reduce the bloated feeling in your belly (abdomen).   No driving for 24 hours (because of the medicine (anesthesia) used during the test).   You may shower.   Do not sign any important legal documents or operate any machinery for 24 hours (because of the anesthesia used during the test).    NUTRITION  Drink plenty of fluids.   You may resume your normal diet as instructed by your doctor.   Begin with a light meal and progress to your normal diet. Heavy or fried foods are harder to digest and may make you feel sick to your stomach (nauseated).   Avoid alcoholic beverages for 24 hours or as instructed.    MEDICATIONS  You may resume your normal medications.   WHAT YOU CAN EXPECT TODAY  Some feelings of bloating in the abdomen.   Passage of more gas than usual.    IF  YOU HAD A BIOPSY TAKEN DURING THE UPPER ENDOSCOPY:  Eat a soft diet IF YOU HAVE NAUSEA, BLOATING, ABDOMINAL PAIN, OR VOMITING.    FINDING OUT THE RESULTS OF YOUR TEST Not all test results are available during your visit. DR. Darrick PennaFIELDS WILL CALL YOU WITHIN 7 DAYS OF YOUR PROCEDUE WITH YOUR RESULTS. Do not assume everything is normal if you have not heard from DR. Breslyn Abdo IN ONE WEEK, CALL HER OFFICE AT [phone].  SEEK IMMEDIATE MEDICAL ATTENTION AND CALL THE OFFICE: [phone] IF:  You have more than a spotting of blood in your stool.   Your belly is swollen (abdominal distention).   You are nauseated or vomiting.   You have a temperature over 101F.   You have abdominal pain or discomfort that is severe or gets worse throughout the day.  Gastritis  Gastritis is an inflammation (the body's way of reacting to injury and/or infection) of the stomach. It is often caused by viral or bacterial (germ) infections. It can also be caused BY ASPIRIN, BC/GOODY POWDER'S, (IBUPROFEN) MOTRIN, OR ALEVE (NAPROXEN), chemicals (including alcohol), SPICY FOODS, and medications. This illness may be associated with generalized malaise (feeling tired, not well), UPPER ABDOMINAL STOMACH cramps, and fever. One common bacterial cause of gastritis is an organism known as H. Pylori. This can be treated with antibiotics.   DYSPHAGIA DYSPHAGIA can be caused by stomach acid backing up into the tube that carries food  from the mouth down to the stomach (lower esophagus).  TREATMENT There are a number of medicines used to treat DYSPHAGIA including: Antacids.  Proton-pump inhibitors: PROTONIX  HOME CARE INSTRUCTIONS Eat 2-3 hours before going to bed.  Try to reach and maintain a healthy weight.  Do not eat just a few very large meals. Instead, eat 4 TO 6 smaller meals throughout the day.  Try to identify foods and beverages that make your symptoms worse, and avoid these.  Avoid tight clothing.  Do not exercise  right after eating.    Lifestyle and home remedies TO MANAGE REFLUX/CHEST PAIN  You may eliminate or reduce the frequency of heartburn by making the following lifestyle changes:   Control your weight. Being overweight is a major risk factor for heartburn and GERD. Excess pounds put pressure on your abdomen, pushing up your stomach and causing acid to back up into your esophagus.    Eat smaller meals. 4 TO 6 MEALS A DAY. This reduces pressure on the lower esophageal sphincter, helping to prevent the valve from opening and acid from washing back into your esophagus.    Loosen your belt. Clothes that fit tightly around your waist put pressure on your abdomen and the lower esophageal sphincter.    Eliminate heartburn triggers. Everyone has specific triggers. Common triggers such as fatty or fried foods, spicy food, tomato sauce, carbonated beverages, alcohol, chocolate, mint, garlic, onion, caffeine and nicotine may make heartburn worse.    Avoid stooping or bending. Tying your shoes is OK. Bending over for longer periods to weed your garden isn't, especially soon after eating.    Don't lie down after a meal. Wait at least three to four hours after eating before going to bed, and don't lie down right after eating.    PUT THE HEAD OF YOUR BED ON 6 INCH BLOCKS.   Alternative medicine  Several home remedies exist for treating GERD, but they provide only temporary relief. They include drinking baking soda (sodium bicarbonate) added to water or drinking other fluids such as baking soda mixed with cream of tartar and water.   Although these liquids create temporary relief by neutralizing, washing away or buffering acids, eventually they aggravate the situation by adding gas and fluid to your stomach, increasing pressure and causing more acid reflux. Further, adding more sodium to your diet may increase your blood pressure and add stress to your heart, and excessive bicarbonate ingestion  can alter the acid-base balance in your body.

## 2014-10-07 NOTE — H&P (Signed)
Primary Care Physician:  Ignatius SpeckingVYAS,DHRUV B., MD Primary Gastroenterologist:  Dr. Darrick PennaFields  Pre-Procedure History & Physical: HPI:  Sylvia Anthony is a 59 y.o. female here for DYSPHAGIA/PNHx:  H PYLORI GASTRITIS.  Past Medical History  Diagnosis Date  . Arm numbness   . Hypertension   . Hyperlipidemia   . Hypothyroidism   . Carotid bruit   . Anemia   . Fibromyalgia   . Arterial fibromuscular dysplasia   . Rheumatic fever     age 89, [facility]  . Glucose intolerance (impaired glucose tolerance)   . Peripheral vascular disease   . CHF (congestive heart failure) 1997    pacemaker  . GERD (gastroesophageal reflux disease)   . Mitral valve prolapse   . H. pylori infection Sept 2013    Amoxicillin, Biaxin  . SSS (sick sinus syndrome)     Past Surgical History  Procedure Laterality Date  . Abdominal hysterectomy      partial  . Appendectomy    . Colonoscopy  08/01/2006    3 mm descending colon polyp removed/8 mm sessile ascending colon polyp removed / 3-mm rectal  polyp removed /Rare sigmoid diverticulosis/ Moderate internal hemorrhoids.Advanced adenoma on colonoscopy in March 2008.  The polyp was anadenomatous polyp with a foci of high-grade dysplasia  . Colonoscopy  09/08/2008    SIMPLE ADENOMA/HYPERPLASTIC POLY/Multiple colon polyps (ascending, sigmoid, rectal)  Mild sigmoid colon diverticulosis./ Small internal hemorrhoids  . Cholecystectomy    . Pacemaker insertion  09/13/1995    [facility]  . Tonsillectomy    . Right achilles tendon      X 2  . Cardiac catheterization      multiple  . Growth removed from intestine      [facility]-as teenager, done through colonoscopy  . Colonoscopy with esophagogastroduodenoscopy (egd)  Sept. 30, 2013    ZOX:WRUESLF:Mild diverticulosis was noted in the sigmoid colon/The colon was otherwise normal/Small internal hemorrhoids/EGD:The mucosa of the esophagus appeared  normal/Non-erosive gastritis (inflammation) was found; multiple bx/The duodenal mucosa showed no abnormalities. +H.pylori gastritis, treated with equivalent of prevpac. SAVARY DILATION  . Pacemaker generator change  09/18/2006    generator change by Dr Amil AmenEdmunds with a MDT Adapta L    Prior to Admission medications   Medication Sig Start Date End Date Taking? Authorizing Provider  albuterol (PROVENTIL HFA;VENTOLIN HFA) 108 (90 BASE) MCG/ACT inhaler Inhale 2 puffs into the lungs every 6 (six) hours as needed for wheezing or shortness of breath.   Yes Historical Provider, MD  amLODipine (NORVASC) 10 MG tablet Take 10 mg by mouth daily.   Yes Historical Provider, MD  amoxicillin (AMOXIL) 250 MG/5ML suspension Take 250 mg by mouth as needed. When she go for dental cleaning 08/25/14  Yes Historical Provider, MD  aspirin 325 MG tablet Take 325 mg by mouth daily.   Yes Historical Provider, MD  CALCIUM-MAGNESIUM-ZINC PO Take 1 tablet by mouth daily.    Yes Historical Provider, MD  cetirizine (ZYRTEC) 10 MG tablet Take 10 mg by mouth at bedtime as needed for allergies.    Yes Historical Provider, MD  cloNIDine (CATAPRES) 0.1 MG tablet Take 0.1 mg by mouth daily.   Yes Historical Provider, MD  Coenzyme Q10 (CO Q-10) 50 MG CAPS Take 1 capsule by mouth daily.   Yes Historical Provider, MD  diclofenac (VOLTAREN) 75 MG EC tablet Take 75 mg by mouth daily as needed for mild pain.  Yes Historical Provider, MD  dicyclomine (BENTYL) 10 MG capsule Take 10 mg by mouth daily.    Yes Historical Provider, MD  gabapentin (NEURONTIN) 100 MG capsule Take 100 mg by mouth 2 (two) times daily.   Yes Historical Provider, MD  HYDROcodone-acetaminophen (VICODIN) 5-500 MG per tablet Take 1 tablet by mouth every 6 (six) hours as needed for pain.   Yes Historical Provider, MD  levothyroxine (SYNTHROID, LEVOTHROID) 100 MCG tablet Take 100 mcg by mouth daily.     Yes Historical Provider, MD  lovastatin (MEVACOR) 40 MG tablet Take 40 mg  by mouth at bedtime.   Yes Historical Provider, MD  pantoprazole (PROTONIX) 40 MG tablet Take 1 tablet (40 mg total) by mouth 2 (two) times daily before a meal. 09/03/14  Yes Tiffany KocherLeslie S Lewis, PA-C  potassium chloride SA (K-DUR,KLOR-CON) 20 MEQ tablet Take 20 mEq by mouth daily.    Yes Historical Provider, MD  valsartan-hydrochlorothiazide (DIOVAN-HCT) 320-25 MG per tablet Take 1 tablet by mouth daily.     Yes Historical Provider, MD  nitroGLYCERIN (NITROSTAT) 0.4 MG SL tablet Place 0.4 mg under the tongue every 5 (five) minutes as needed for chest pain.    Historical Provider, MD  traMADol (ULTRAM) 50 MG tablet Take 50 mg by mouth every 4 (four) hours as needed (migraines).    Historical Provider, MD    Allergies as of 09/23/2014  . (No Known Allergies)    Family History  Problem Relation Age of Onset  . Hyperlipidemia Mother   . Hypertension Mother   . Heart disease Father   . Hypertension Father   . Hyperlipidemia Sister   . Hypertension Sister   . Hyperlipidemia Brother   . Hypertension Brother   . Colon polyps Brother   . Colon cancer Neg Hx     History   Social History  . Marital Status: Married    Spouse Name: N/A  . Number of Children: 1  . Years of Education: N/A   Occupational History  . part-time hairdresser    Social History Main Topics  . Smoking status: Former Smoker -- 0.25 packs/day for 4 years  . Smokeless tobacco: Never Used     Comment: just in teens  . Alcohol Use: No  . Drug Use: No  . Sexual Activity: Not on file   Other Topics Concern  . Not on file   Social History Narrative   [place] in [place].  Son is healthy at age 61.   Disabled          Review of Systems: See HPI, otherwise negative ROS   Physical Exam: BP 136/86 mmHg  Pulse 67  Temp(Src) 97.6 F (36.4 C) (Oral)  Resp 18  SpO2 95% General:   Alert,  pleasant and cooperative in NAD Head:  Normocephalic and atraumatic. Neck:  Supple; Lungs:  Clear throughout to  auscultation.    Heart:  Regular rate and rhythm. Abdomen:  Soft, nontender and nondistended. Normal bowel sounds, without guarding, and without rebound.   Neurologic:  Alert and  oriented x4;  grossly normal neurologically.  Impression/Plan:    DYSPHAGIA  PLAN:  EGD/DIL TODAY

## 2014-10-07 NOTE — Progress Notes (Signed)
REVIEWED-NO ADDITIONAL RECOMMENDATIONS. 

## 2014-10-07 NOTE — Op Note (Addendum)
[facility] [address] [place], [id number]   ENDOSCOPY PROCEDURE REPORT  PATIENT: Sylvia GaribaldiHairston, Sylvia Anthony  MR#: [id number] BIRTHDATE: 12-11-1955 , 58  yrs. old GENDER: female  ENDOSCOPIST: West BaliSandi L Lashauna Arpin, MD REFFERED XB:JYNWGBY:Dhruv Sherril CroonVyas, M.D.  PROCEDURE DATE:  10/07/2014 PROCEDURE:   EGD with biopsy and EGD with dilatation over guidewire   INDICATIONS:1.  dysphagia.   2.  PMHx: H PYLORI GASTRITIS. MEDICATIONS: Versed 5 mg IV and Demerol 50 mg IV TOPICAL ANESTHETIC: Viscous Xylocaine  DESCRIPTION OF PROCEDURE:   After the risks benefits and alternatives of the procedure were thoroughly explained, informed consent was obtained.  The EG-[id number] ([id number]  endoscope was introduced through the mouth and advanced to the second portion of the duodenum. The instrument was slowly withdrawn as the mucosa was carefully examined.  Prior to withdrawal of the scope, the guidwire was placed.  The esophagus was dilated successfully.  The patient was recovered in endoscopy and discharged home in satisfactory condition.   ESOPHAGUS: The mucosa of the esophagus appeared normal.  Multiple biopsies were taken in the distal (35 CM FROM THE TEETH)and PROXIMAL(20 CM FROM THE TEETH) esophagus to rule out eosinophilic esophagitis. GE JUNCTION 40 CM FROM THE TEETH. STOMACH: Moderate non-erosive gastritis (inflammation) was found in the gastric antrum and on the greater curvature of the gastric body.  Multiple biopsies were performed using cold forceps.   DUODENUM: The duodenal mucosa showed no abnormalities in the bulb and second portion of the duodenum.   Dilation was then performed at the gastroesphageal junction Dilator: Savary over guidewire Size(s): 15-17 MM Resistance: moderate Heme: yes  COMPLICATIONS: There were no immediate complications.  ENDOSCOPIC IMPRESSION: 1.   DYSPHAGIA-NO DEFINITES TRICTURE APPRECIATED. 2.  MODERATE Non-erosive gastritis  RECOMMENDATIONS: LOSE  WEIGHT. AVOID TRIGGERS FOR REFLUX. PROTONIX 30 MINUTES PRIOR TO MEALS TWICE DAILY. AWAIT BIOPSY RESULTS. FOLLOW UP IN 4 MOS.  If difficulty swallowing is not better in 2 mos, PT NEEDS BPE AND/OR MANOMETRY.   eSigned:  West BaliSandi L Knowledge Escandon, MD 10/07/2014 11:52 AM   CPT CODES: ICD CODES:  The ICD and CPT codes recommended by this software are interpretations from the data that the clinical staff has captured with the software.  The verification of the translation of this report to the ICD and CPT codes and modifiers is the sole responsibility of the health care institution and practicing physician where this report was generated.  [employer] will not be held responsible for the validity of the ICD and CPT codes included on this report.  AMA assumes no liability for data contained or not contained herein. CPT is a Publishing rights managerregistered trademark of the [employer].

## 2014-10-08 ENCOUNTER — Encounter (HOSPITAL_COMMUNITY): Payer: Self-pay | Admitting: Gastroenterology

## 2014-10-10 ENCOUNTER — Ambulatory Visit (INDEPENDENT_AMBULATORY_CARE_PROVIDER_SITE_OTHER): Payer: BLUE CROSS/BLUE SHIELD | Admitting: Internal Medicine

## 2014-10-10 ENCOUNTER — Encounter: Payer: Self-pay | Admitting: Internal Medicine

## 2014-10-10 VITALS — BP 110/82 | HR 71 | Ht 64.0 in | Wt 195.0 lb

## 2014-10-10 DIAGNOSIS — I495 Sick sinus syndrome: Secondary | ICD-10-CM | POA: Diagnosis not present

## 2014-10-10 DIAGNOSIS — I10 Essential (primary) hypertension: Secondary | ICD-10-CM | POA: Diagnosis not present

## 2014-10-10 LAB — CUP PACEART INCLINIC DEVICE CHECK
Battery Remaining Longevity: 50 mo
Battery Voltage: 2.75 V
Brady Statistic AP VP Percent: 0 %
Brady Statistic AS VS Percent: 28 %
Date Time Interrogation Session: 20160513094336
Lead Channel Impedance Value: 899 Ohm
Lead Channel Pacing Threshold Amplitude: 0.5 V
Lead Channel Pacing Threshold Amplitude: 1 V
Lead Channel Pacing Threshold Pulse Width: 0.4 ms
Lead Channel Sensing Intrinsic Amplitude: 2 mV
Lead Channel Setting Pacing Amplitude: 2 V
Lead Channel Setting Pacing Amplitude: 2.5 V
Lead Channel Setting Pacing Pulse Width: 0.4 ms
Lead Channel Setting Sensing Sensitivity: 5.6 mV
MDC IDC MSMT BATTERY IMPEDANCE: 946 Ohm
MDC IDC MSMT LEADCHNL RA IMPEDANCE VALUE: 932 Ohm
MDC IDC MSMT LEADCHNL RA PACING THRESHOLD PULSEWIDTH: 0.4 ms
MDC IDC MSMT LEADCHNL RV SENSING INTR AMPL: 22.4 mV
MDC IDC STAT BRADY AP VS PERCENT: 71 %
MDC IDC STAT BRADY AS VP PERCENT: 0 %

## 2014-10-10 NOTE — Patient Instructions (Signed)
Your physician recommends that you continue on your current medications as directed. Please refer to the Current Medication list given to you today. Carelink check 01/12/15. Your physician recommends that you schedule a follow-up appointment in: 1 year. You will receive a reminder letter in the mail in about 10 months reminding you to call and schedule your appointment. If you don't receive this letter, please contact our office.

## 2014-10-10 NOTE — Progress Notes (Signed)
Electrophysiology Office Note   Date:  10/10/2014   ID:  Sylvia Anthony, DOB 04/16/1956, MRN [id number]  PCP:  Ignatius SpeckingVYAS,DHRUV B., MD  Cardiologist:  Dr Katrinka BlazingSmith Primary Electrophysiologist: Hillis RangeJames Krisann Mckenna, MD    Chief Complaint  Patient presents with  . Dysphagia     History of Present Illness: Sylvia Anthony is a 59 y.o. female who presents today for electrophysiology evaluation.   Doing well at this time.  Had her esophagus stressed recently for dysphagia and is doing "better".  Today, she denies symptoms of palpitations, chest pain, shortness of breath, orthopnea, PND, lower extremity edema, claudication, dizziness, presyncope, syncope, bleeding, or neurologic sequela. The patient is tolerating medications without difficulties and is otherwise without complaint today.    Past Medical History  Diagnosis Date  . Arm numbness   . Hypertension   . Hyperlipidemia   . Hypothyroidism   . Carotid bruit   . Anemia   . Fibromyalgia   . Arterial fibromuscular dysplasia   . Rheumatic fever     age 27, was [facility]  . Glucose intolerance (impaired glucose tolerance)   . Peripheral vascular disease   . CHF (congestive heart failure) 1997    pacemaker  . GERD (gastroesophageal reflux disease)   . Mitral valve prolapse   . H. pylori infection Sept 2013    Amoxicillin, Biaxin  . SSS (sick sinus syndrome)    Past Surgical History  Procedure Laterality Date  . Abdominal hysterectomy      partial  . Appendectomy    . Colonoscopy  08/01/2006    3 mm descending colon polyp removed/8 mm sessile ascending colon polyp removed / 3-mm rectal  polyp removed /Rare sigmoid diverticulosis/ Moderate internal hemorrhoids.Advanced adenoma on colonoscopy in March 2008.  The polyp was anadenomatous polyp with a foci of high-grade dysplasia  . Colonoscopy  09/08/2008    SIMPLE ADENOMA/HYPERPLASTIC POLY/Multiple colon polyps (ascending, sigmoid, rectal)  Mild sigmoid colon  diverticulosis./ Small internal hemorrhoids  . Cholecystectomy    . Pacemaker insertion  09/13/1995    [facility]  . Tonsillectomy    . Right achilles tendon      X 2  . Cardiac catheterization      multiple  . Growth removed from intestine      [facility]-as teenager, done through colonoscopy  . Colonoscopy with esophagogastroduodenoscopy (egd)  Sept. 30, 2013    ATF:TDDUSLF:Mild diverticulosis was noted in the sigmoid colon/The colon was otherwise normal/Small internal hemorrhoids/EGD:The mucosa of the esophagus appeared normal/Non-erosive gastritis (inflammation) was found; multiple bx/The duodenal mucosa showed no abnormalities. +H.pylori gastritis, treated with equivalent of prevpac. SAVARY DILATION  . Pacemaker generator change  09/18/2006    generator change by Dr Amil AmenEdmunds with a MDT Adapta L  . Esophagogastroduodenoscopy N/A 10/07/2014    Procedure: ESOPHAGOGASTRODUODENOSCOPY (EGD);  Surgeon: West BaliSandi L Fields, MD;  Location: [facility] ENDO SUITE;  Service: Endoscopy;  Laterality: N/A;  1030 - moved to 10:10 - office notified pt  . Esophageal dilation N/A 10/07/2014    Procedure: ESOPHAGEAL DILATION;  Surgeon: West BaliSandi L Fields, MD;  Location: [facility] ENDO SUITE;  Service: Endoscopy;  Laterality: N/A;     Current Outpatient Prescriptions  Medication Sig Dispense Refill  . albuterol (PROVENTIL HFA;VENTOLIN HFA) 108 (90 BASE) MCG/ACT inhaler Inhale 2 puffs into the lungs every 6 (six) hours as needed for wheezing or shortness of breath.    Marland Kitchen. amLODipine (NORVASC)  10 MG tablet Take 10 mg by mouth daily.    Marland Kitchen. amoxicillin (AMOXIL) 250 MG/5ML suspension Take 250 mg by mouth as needed. When she go for dental cleaning    . aspirin 325 MG tablet Take 325 mg by mouth daily.    Marland Kitchen. CALCIUM-MAGNESIUM-ZINC PO Take 1 tablet by mouth daily.     . cetirizine (ZYRTEC) 10 MG tablet Take 10 mg by mouth at bedtime as needed for allergies.     . cloNIDine (CATAPRES) 0.1 MG tablet Take 0.1  mg by mouth daily.    . Coenzyme Q10 (CO Q-10) 50 MG CAPS Take 1 capsule by mouth daily.    . diclofenac (VOLTAREN) 75 MG EC tablet Take 75 mg by mouth daily as needed for mild pain.    Marland Kitchen. gabapentin (NEURONTIN) 100 MG capsule Take 100 mg by mouth 2 (two) times daily.    Marland Kitchen. HYDROcodone-acetaminophen (VICODIN) 5-500 MG per tablet Take 1 tablet by mouth every 6 (six) hours as needed for pain.    Marland Kitchen. levothyroxine (SYNTHROID, LEVOTHROID) 100 MCG tablet Take 100 mcg by mouth daily.      Marland Kitchen. lovastatin (MEVACOR) 40 MG tablet Take 40 mg by mouth at bedtime.    . nitroGLYCERIN (NITROSTAT) 0.4 MG SL tablet Place 0.4 mg under the tongue every 5 (five) minutes as needed for chest pain.    . pantoprazole (PROTONIX) 40 MG tablet Take 1 tablet (40 mg total) by mouth 2 (two) times daily before a meal. 60 tablet 5  . potassium chloride SA (K-DUR,KLOR-CON) 20 MEQ tablet Take 20 mEq by mouth daily.     . traMADol (ULTRAM) 50 MG tablet Take 50 mg by mouth every 4 (four) hours as needed (migraines).    . valsartan-hydrochlorothiazide (DIOVAN-HCT) 320-25 MG per tablet Take 1 tablet by mouth daily.       No current facility-administered medications for this visit.    Allergies:   Review of patient's allergies indicates no known allergies.   Social History:  The patient  reports that she quit smoking about 29 years ago. She has never used smokeless tobacco. She reports that she does not drink alcohol or use illicit drugs.   Family History:  The patient's family history includes Colon polyps in her brother; Heart disease in her father; Hyperlipidemia in her brother, mother, and sister; Hypertension in her brother, father, mother, and sister. There is no history of Colon cancer.    ROS:  Please see the history of present illness.   All other systems are reviewed and negative.    PHYSICAL EXAM: VS:  BP 110/82 mmHg  Pulse 71  Ht 5\' 4"  (1.626 m)  Wt 195 lb (88.451 kg)  BMI 33.46 kg/m2  SpO2 98% , BMI Body mass index  is 33.46 kg/(m^2). GEN: Well nourished, well developed, in no acute distress HEENT: normal Neck: no JVD, carotid bruits, or masses Cardiac: RRR; no murmurs, rubs, or gallops,no edema  Respiratory:  clear to auscultation bilaterally, normal work of breathing GI: soft, nontender, nondistended, + BS MS: no deformity or atrophy Skin: warm and dry, device pocket is well healed Neuro:  Strength and sensation are intact Psych: euthymic mood, full affect  Device interrogation is reviewed today in detail.  See PaceArt for details.   Recent Labs: No results found for requested labs within last 365 days.    Lipid Panel  No results found for: CHOL, TRIG, HDL, CHOLHDL, VLDL, LDLCALC, LDLDIRECT   Wt Readings from Last 3 Encounters:  10/10/14 195 lb (88.451 kg)  09/23/14 197 lb 9.6 oz (89.631 kg)  09/15/14 198 lb (89.812 kg)      Other studies Reviewed: Additional studies/ records that were reviewed today include: Dr Lonn Georgia last note    ASSESSMENT AND PLAN:  1.  Sick sinus syndrome Normal pacemaker function See Pace Art report No changes today No arrhythmias  2. htn Stable No change required today   Current medicines are reviewed at length with the patient today.   The patient does not have concerns regarding her medicines.  The following changes were made today:  none  Labs/ tests ordered today include:  Orders Placed This Encounter  Procedures  . Implantable device check    Follow-up: carelink, return to see me in 1 year Follow-up with Dr Katrinka Blazing as scheduled  Signed, Hillis Range, MD  10/10/2014 9:48 AM     [facility] [address] [place] [address] [phone] (office) [phone] (fax)

## 2014-10-15 ENCOUNTER — Telehealth: Payer: Self-pay | Admitting: Gastroenterology

## 2014-10-15 NOTE — Telephone Encounter (Signed)
LMOM to call.

## 2014-10-15 NOTE — Telephone Encounter (Signed)
PATIENT HAS APPOINTMENT  °

## 2014-10-15 NOTE — Telephone Encounter (Signed)
Please call pt. HER stomach Bx shows gastritIs. Her esophagus biopsies show injury from acid reflux.    TO HELP CONTROL YOUR ACID REFLUX:  CONTINUE TO MONITOR SYMPTOMS. LOSE 10 LBS  AVOID TRIGGERS FOR REFLUX.   FOLLOW A LOW FAT DIET. MEATS SHOULD BE BAKED, BROILED, OR BOILED.   CONTINUE PROTONIX. TAKE 30 MINUTES PRIOR TO MEALS TWICE DAILY.  CALL IN 2 MOS IF YOUR difficulty swallowing is not better.  FOLLOW UP IN 4 MOS E30 GERD/DYSPHAGIA.

## 2014-10-16 NOTE — Telephone Encounter (Signed)
LMOM to call and letter mailed also.  

## 2014-11-11 ENCOUNTER — Encounter: Payer: Self-pay | Admitting: Internal Medicine

## 2015-01-12 ENCOUNTER — Ambulatory Visit (INDEPENDENT_AMBULATORY_CARE_PROVIDER_SITE_OTHER): Payer: BLUE CROSS/BLUE SHIELD | Admitting: *Deleted

## 2015-01-12 ENCOUNTER — Telehealth: Payer: Self-pay | Admitting: Cardiology

## 2015-01-12 DIAGNOSIS — I495 Sick sinus syndrome: Secondary | ICD-10-CM

## 2015-01-12 NOTE — Telephone Encounter (Signed)
LMOVM reminding pt to send remote transmission.   

## 2015-01-12 NOTE — Progress Notes (Signed)
Remote pacemaker transmission.   

## 2015-01-14 LAB — CUP PACEART REMOTE DEVICE CHECK
Battery Impedance: 1080 Ohm
Battery Remaining Longevity: 46 mo
Battery Voltage: 2.75 V
Brady Statistic AS VS Percent: 32 %
Date Time Interrogation Session: 20160815165037
Lead Channel Impedance Value: 909 Ohm
Lead Channel Pacing Threshold Amplitude: 0.875 V
Lead Channel Pacing Threshold Pulse Width: 0.4 ms
Lead Channel Sensing Intrinsic Amplitude: 1.4 mV
Lead Channel Sensing Intrinsic Amplitude: 16 mV
Lead Channel Setting Pacing Amplitude: 2 V
Lead Channel Setting Pacing Amplitude: 2.5 V
Lead Channel Setting Pacing Pulse Width: 0.4 ms
Lead Channel Setting Sensing Sensitivity: 5.6 mV
MDC IDC MSMT LEADCHNL RA PACING THRESHOLD AMPLITUDE: 0.625 V
MDC IDC MSMT LEADCHNL RV IMPEDANCE VALUE: 919 Ohm
MDC IDC MSMT LEADCHNL RV PACING THRESHOLD PULSEWIDTH: 0.4 ms
MDC IDC STAT BRADY AP VP PERCENT: 0 %
MDC IDC STAT BRADY AP VS PERCENT: 67 %
MDC IDC STAT BRADY AS VP PERCENT: 0 %

## 2015-01-20 ENCOUNTER — Encounter: Payer: Self-pay | Admitting: Cardiology

## 2015-01-23 ENCOUNTER — Encounter: Payer: Self-pay | Admitting: Internal Medicine

## 2015-02-04 ENCOUNTER — Encounter: Payer: Self-pay | Admitting: Gastroenterology

## 2015-02-04 ENCOUNTER — Ambulatory Visit: Payer: BLUE CROSS/BLUE SHIELD | Admitting: Gastroenterology

## 2015-02-04 ENCOUNTER — Telehealth: Payer: Self-pay | Admitting: Gastroenterology

## 2015-02-04 NOTE — Telephone Encounter (Signed)
PATIENT WAS A NO SHOW AND LETTER SENT  °

## 2015-04-13 ENCOUNTER — Encounter: Payer: Self-pay | Admitting: Gastroenterology

## 2015-04-13 ENCOUNTER — Telehealth: Payer: Self-pay | Admitting: Cardiology

## 2015-04-13 ENCOUNTER — Encounter: Payer: Medicare Other | Admitting: *Deleted

## 2015-04-13 ENCOUNTER — Ambulatory Visit (INDEPENDENT_AMBULATORY_CARE_PROVIDER_SITE_OTHER): Payer: BLUE CROSS/BLUE SHIELD | Admitting: Gastroenterology

## 2015-04-13 VITALS — BP 146/88 | HR 85 | Temp 97.4°F | Ht 64.5 in | Wt 202.0 lb

## 2015-04-13 DIAGNOSIS — K219 Gastro-esophageal reflux disease without esophagitis: Secondary | ICD-10-CM | POA: Diagnosis not present

## 2015-04-13 DIAGNOSIS — K5900 Constipation, unspecified: Secondary | ICD-10-CM

## 2015-04-13 NOTE — Patient Instructions (Signed)
Let's try taking Protonix just once a day, 30 minutes before breakfast each morning. If needed, you can increase back to twice a day.   You may try Miralax mixed in 8 ounces of water daily to every other day as needed. If you need anything stronger, let us know.   I will see you back in 1 year or sooner if necessary!

## 2015-04-13 NOTE — Progress Notes (Signed)
Referring Provider: Ignatius SpeckingVyas, Dhruv B., MD Primary Care Physician:  Ignatius SpeckingVYAS,DHRUV B., MD Primary GI: Dr. Darrick PennaFields   Chief Complaint  Patient presents with  . Follow-up    HPI:   Sylvia GaribaldiLiaretta B Anthony is a 59 y.o. female presenting today with a history of GERD and dysphagia, with most recent EGD in May 2016 with empiric dilation and gastritis. Negative H.pylori. Next colonoscopy due 2018.   No further dysphagia. BM about once a week. No abdominal pain, just feels bloated. Doesn't want to try prescription medication yet.   Past Medical History  Diagnosis Date  . Arm numbness   . Hypertension   . Hyperlipidemia   . Hypothyroidism   . Carotid bruit   . Anemia   . Fibromyalgia   . Arterial fibromuscular dysplasia (HCC)   . Rheumatic fever     age 89, [facility]  . Glucose intolerance (impaired glucose tolerance)   . Peripheral vascular disease (HCC)   . CHF (congestive heart failure) (HCC) 1997    pacemaker  . GERD (gastroesophageal reflux disease)   . Mitral valve prolapse   . H. pylori infection Sept 2013    Amoxicillin, Biaxin  . SSS (sick sinus syndrome) [facility](HCC)     Past Surgical History  Procedure Laterality Date  . Abdominal hysterectomy      partial  . Appendectomy    . Colonoscopy  08/01/2006    3 mm descending colon polyp removed/8 mm sessile ascending colon polyp removed / 3-mm rectal  polyp removed /Rare sigmoid diverticulosis/ Moderate internal hemorrhoids.Advanced adenoma on colonoscopy in March 2008.  The polyp was anadenomatous polyp with a foci of high-grade dysplasia  . Colonoscopy  09/08/2008    SIMPLE ADENOMA/HYPERPLASTIC POLY/Multiple colon polyps (ascending, sigmoid, rectal)  Mild sigmoid colon diverticulosis./ Small internal hemorrhoids  . Cholecystectomy    . Pacemaker insertion  09/13/1995    for sick sinus syndome and syncope at [facility]  . Tonsillectomy    . Right achilles tendon      X 2  . Cardiac catheterization       multiple  . Growth removed from intestine      [facility]-as teenager, done through colonoscopy  . Colonoscopy with esophagogastroduodenoscopy (egd)  Sept. 30, 2013    MWU:XLKGSLF:Mild diverticulosis was noted in the sigmoid colon/The colon was otherwise normal/Small internal hemorrhoids/EGD:The mucosa of the esophagus appeared normal/Non-erosive gastritis (inflammation) was found; multiple bx/The duodenal mucosa showed no abnormalities. +H.pylori gastritis, treated with equivalent of prevpac. SAVARY DILATION  . Pacemaker generator change  09/18/2006    generator change by Dr Amil AmenEdmunds with a MDT Adapta L  . Esophagogastroduodenoscopy N/A 10/07/2014    Dr. Darrick PennaFields: moderate non-erosive gastritis, no definite stricture, empiric dilation . negative H.pylori   . Esophageal dilation N/A 10/07/2014    Procedure: ESOPHAGEAL DILATION;  Surgeon: West BaliSandi L Fields, MD;  Location: [facility];  Service: Endoscopy;  Laterality: N/A;    Current Outpatient Prescriptions  Medication Sig Dispense Refill  . albuterol (PROVENTIL HFA;VENTOLIN HFA) 108 (90 BASE) MCG/ACT inhaler Inhale 2 puffs into the lungs every 6 (six) hours as needed for wheezing or shortness of breath.    Marland Kitchen. amLODipine (NORVASC) 10 MG tablet Take 10 mg by mouth daily.    Marland Kitchen. amoxicillin (AMOXIL) 250 MG/5ML suspension Take 250 mg by mouth as needed. When she go for dental cleaning    . aspirin 325 MG tablet Take 325 mg by mouth daily.    .Marland Kitchen  CALCIUM-MAGNESIUM-ZINC PO Take 1 tablet by mouth daily.     . cetirizine (ZYRTEC) 10 MG tablet Take 10 mg by mouth at bedtime as needed for allergies.     . cloNIDine (CATAPRES) 0.1 MG tablet Take 0.1 mg by mouth daily.    . Coenzyme Q10 (CO Q-10) 50 MG CAPS Take 1 capsule by mouth daily.    . diclofenac (VOLTAREN) 75 MG EC tablet Take 75 mg by mouth daily as needed for mild pain.    Marland Kitchen gabapentin (NEURONTIN) 100 MG capsule Take 100 mg by mouth 2 (two) times daily.    Marland Kitchen HYDROcodone-acetaminophen (VICODIN) 5-500 MG per tablet  Take 1 tablet by mouth every 6 (six) hours as needed for pain.    Marland Kitchen levothyroxine (SYNTHROID, LEVOTHROID) 100 MCG tablet Take 100 mcg by mouth daily.      Marland Kitchen lovastatin (MEVACOR) 40 MG tablet Take 40 mg by mouth at bedtime.    . nitroGLYCERIN (NITROSTAT) 0.4 MG SL tablet Place 0.4 mg under the tongue every 5 (five) minutes as needed for chest pain.    . pantoprazole (PROTONIX) 40 MG tablet Take 1 tablet (40 mg total) by mouth 2 (two) times daily before a meal. 60 tablet 5  . potassium chloride SA (K-DUR,KLOR-CON) 20 MEQ tablet Take 20 mEq by mouth daily.     . traMADol (ULTRAM) 50 MG tablet Take 50 mg by mouth every 4 (four) hours as needed (migraines).    . valsartan-hydrochlorothiazide (DIOVAN-HCT) 320-25 MG per tablet Take 1 tablet by mouth daily.       No current facility-administered medications for this visit.    Allergies as of 04/13/2015  . (No Known Allergies)    Family History  Problem Relation Age of Onset  . Hyperlipidemia Mother   . Hypertension Mother   . Heart disease Father   . Hypertension Father   . Hyperlipidemia Sister   . Hypertension Sister   . Hyperlipidemia Brother   . Hypertension Brother   . Colon polyps Brother   . Colon cancer Neg Hx     Social History   Social History  . Marital Status: Married    Spouse Name: N/A  . Number of Children: 1  . Years of Education: N/A   Occupational History  . part-time hairdresser    Social History Main Topics  . Smoking status: Former Smoker -- 0.25 packs/day for 4 years    Quit date: 10/09/1985  . Smokeless tobacco: Never Used     Comment: just in teens  . Alcohol Use: No  . Drug Use: No  . Sexual Activity: Not Asked   Other Topics Concern  . None   Social History Narrative   Lives in [place] with spouse.  Son is healthy at age 47.   Disabled          Review of Systems: Negative unless mentioned in HPI   Physical Exam: BP 146/88 mmHg  Pulse 85  Temp(Src) 97.4 F (36.3 C) (Oral)  Ht 5' 4.5"  (1.638 m)  Wt 202 lb (91.627 kg)  BMI 34.15 kg/m2 General:   Alert and oriented. No distress noted. Pleasant and cooperative.  Head:  Normocephalic and atraumatic. Eyes:  Conjuctiva clear without scleral icterus. Abdomen:  +BS, soft, non-tender and non-distended. No rebound or guarding. No HSM or masses noted. Msk:  Symmetrical without gross deformities. Normal posture. Extremities:  Without edema. Neurologic:  Alert and  oriented x4;  grossly normal neurologically. Psych:  Alert and cooperative. Normal  mood and affect.

## 2015-04-13 NOTE — Telephone Encounter (Signed)
LMOVM reminding pt to send remote transmission.   

## 2015-04-14 ENCOUNTER — Encounter: Payer: Self-pay | Admitting: Cardiology

## 2015-04-17 NOTE — Assessment & Plan Note (Signed)
Doing well on Protonix BID. Will trial once a day. If any breakthrough symptoms, resume daily. Return in 1 year or sooner as needed.

## 2015-04-17 NOTE — Assessment & Plan Note (Signed)
With associated bloating. Colonoscopy fairly recent on file and due again in 2018. No concerning symptoms. Start taking Miralax daily to every other day as needed. Offered prescription for Linzess but would like to wait. Patient to call if she would like more aggressive management.

## 2015-04-20 NOTE — Progress Notes (Signed)
cc'd to pcp 

## 2015-04-27 ENCOUNTER — Ambulatory Visit (INDEPENDENT_AMBULATORY_CARE_PROVIDER_SITE_OTHER): Payer: BLUE CROSS/BLUE SHIELD | Admitting: *Deleted

## 2015-04-27 DIAGNOSIS — I495 Sick sinus syndrome: Secondary | ICD-10-CM

## 2015-04-29 NOTE — Progress Notes (Signed)
Remote pacemaker transmission.   

## 2015-05-07 LAB — CUP PACEART REMOTE DEVICE CHECK
Battery Impedance: 1133 Ohm
Battery Remaining Longevity: 44 mo
Battery Voltage: 2.75 V
Brady Statistic AP VP Percent: 0 %
Brady Statistic AS VP Percent: 0 %
Implantable Lead Implant Date: 19970416
Implantable Lead Location: 753859
Implantable Lead Model: 5034
Implantable Lead Model: 5534
Lead Channel Impedance Value: 809 Ohm
Lead Channel Setting Pacing Pulse Width: 0.4 ms
Lead Channel Setting Sensing Sensitivity: 5.6 mV
MDC IDC LEAD IMPLANT DT: 19970416
MDC IDC LEAD LOCATION: 753860
MDC IDC MSMT LEADCHNL RA IMPEDANCE VALUE: 892 Ohm
MDC IDC SESS DTM: 20161128135756
MDC IDC SET LEADCHNL RA PACING AMPLITUDE: 2 V
MDC IDC SET LEADCHNL RV PACING AMPLITUDE: 2.5 V
MDC IDC STAT BRADY AP VS PERCENT: 68 %
MDC IDC STAT BRADY AS VS PERCENT: 31 %

## 2015-05-08 ENCOUNTER — Encounter: Payer: Self-pay | Admitting: Cardiology

## 2015-06-09 ENCOUNTER — Encounter: Payer: Self-pay | Admitting: Interventional Cardiology

## 2015-06-09 ENCOUNTER — Ambulatory Visit (INDEPENDENT_AMBULATORY_CARE_PROVIDER_SITE_OTHER): Payer: BLUE CROSS/BLUE SHIELD | Admitting: Interventional Cardiology

## 2015-06-09 VITALS — BP 136/88 | HR 80 | Ht 64.5 in | Wt 202.0 lb

## 2015-06-09 DIAGNOSIS — I119 Hypertensive heart disease without heart failure: Secondary | ICD-10-CM | POA: Diagnosis not present

## 2015-06-09 DIAGNOSIS — Z950 Presence of cardiac pacemaker: Secondary | ICD-10-CM

## 2015-06-09 DIAGNOSIS — I495 Sick sinus syndrome: Secondary | ICD-10-CM

## 2015-06-09 DIAGNOSIS — I10 Essential (primary) hypertension: Secondary | ICD-10-CM | POA: Diagnosis not present

## 2015-06-09 NOTE — Progress Notes (Signed)
Cardiology Office Note   Date:  06/09/2015   ID:  Sylvia Anthony, DOB 31-Mar-1956, MRN [id number]  PCP:  Ignatius Specking., MD  Cardiologist:  Lesleigh Noe, MD   Chief Complaint  Patient presents with  . Hypertension      History of Present Illness: Sylvia Anthony is a 60 y.o. female who presents for  Hypertension, hyperlipidemia, history of fibromuscular dysplasia, sick sinus syndrome with permanent pacemaker.   No dyspnea or chest pain. Sharp shooting interscapular discomfort from time to time. Occasional sharp shooting  Subclavicular discomfort. No chest tightness or exertional intolerance. Denies lower extremity swelling and orthopnea.   this is a yearly cardiovascular follow-up. She also has history of pacemaker therapy for sick sinus syndrome.    Past Medical History  Diagnosis Date  . Arm numbness   . Hypertension   . Hyperlipidemia   . Hypothyroidism   . Carotid bruit   . Anemia   . Fibromyalgia   . Arterial fibromuscular dysplasia (HCC)   . Rheumatic fever     age 35, was at [facility] for 17 weeks  . Glucose intolerance (impaired glucose tolerance)   . Peripheral vascular disease (HCC)   . CHF (congestive heart failure) (HCC) 1997    pacemaker  . GERD (gastroesophageal reflux disease)   . Mitral valve prolapse   . H. pylori infection Sept 2013    Amoxicillin, Biaxin  . SSS (sick sinus syndrome) [facility])     Past Surgical History  Procedure Laterality Date  . Abdominal hysterectomy      partial  . Appendectomy    . Colonoscopy  08/01/2006    3 mm descending colon polyp removed/8 mm sessile ascending colon polyp removed / 3-mm rectal  polyp removed /Rare sigmoid diverticulosis/ Moderate internal hemorrhoids.Advanced adenoma on colonoscopy in March 2008.  The polyp was anadenomatous polyp with a foci of high-grade dysplasia  . Colonoscopy  09/08/2008    SIMPLE ADENOMA/HYPERPLASTIC POLY/Multiple colon polyps (ascending, sigmoid, rectal)   Mild sigmoid colon diverticulosis./ Small internal hemorrhoids  . Cholecystectomy    . Pacemaker insertion  09/13/1995    for sick sinus syndome and syncope at [facility]  . Tonsillectomy    . Right achilles tendon      X 2  . Cardiac catheterization      multiple  . Growth removed from intestine      [facility]-as teenager, done through colonoscopy  . Colonoscopy with esophagogastroduodenoscopy (egd)  Sept. 30, 2013    WUJ:WJXB diverticulosis was noted in the sigmoid colon/The colon was otherwise normal/Small internal hemorrhoids/EGD:The mucosa of the esophagus appeared normal/Non-erosive gastritis (inflammation) was found; multiple bx/The duodenal mucosa showed no abnormalities. +H.pylori gastritis, treated with equivalent of prevpac. SAVARY DILATION  . Pacemaker generator change  09/18/2006    generator change by Dr Amil Amen with a MDT Adapta L  . Esophagogastroduodenoscopy N/A 10/07/2014    Dr. [name]: moderate non-erosive gastritis, no definite stricture, empiric dilation . negative H.pylori   . Esophageal dilation N/A 10/07/2014    Procedure: ESOPHAGEAL DILATION;  Surgeon: West Bali, MD;  Location: [facility];  Service: Endoscopy;  Laterality: N/A;     Current Outpatient Prescriptions  Medication Sig Dispense Refill  . albuterol (PROVENTIL HFA;VENTOLIN HFA) 108 (90 BASE) MCG/ACT inhaler Inhale 2 puffs into the lungs every 6 (six) hours as needed for wheezing or shortness of breath.    Marland Kitchen amLODipine (NORVASC) 10 MG tablet Take 10 mg by mouth  daily.    . amoxicillin (AMOXIL) 250 MG/5ML suspension Take 250 mg by mouth as needed. When she go for dental cleaning    . aspirin 325 MG tablet Take 325 mg by mouth daily.    Marland Kitchen. CALCIUM-MAGNESIUM-ZINC PO Take 1 tablet by mouth daily.     . cetirizine (ZYRTEC) 10 MG tablet Take 10 mg by mouth at bedtime as needed for allergies.     . cloNIDine (CATAPRES) 0.1 MG tablet Take 0.1 mg by mouth daily.    . Coenzyme Q10 (CO Q-10) 50 MG CAPS  Take 1 capsule by mouth daily.    . diclofenac (VOLTAREN) 75 MG EC tablet Take 75 mg by mouth daily as needed for mild pain.    Marland Kitchen. dicyclomine (BENTYL) 10 MG capsule Take 10 mg by mouth daily.    Marland Kitchen. gabapentin (NEURONTIN) 100 MG capsule Take 100 mg by mouth 2 (two) times daily.    Marland Kitchen. HYDROcodone-acetaminophen (VICODIN) 5-500 MG per tablet Take 1 tablet by mouth every 6 (six) hours as needed for pain.    Marland Kitchen. levothyroxine (SYNTHROID, LEVOTHROID) 100 MCG tablet Take 100 mcg by mouth daily.      Marland Kitchen. lovastatin (MEVACOR) 40 MG tablet Take 40 mg by mouth at bedtime.    . nitroGLYCERIN (NITROSTAT) 0.4 MG SL tablet Place 0.4 mg under the tongue every 5 (five) minutes as needed for chest pain.    . pantoprazole (PROTONIX) 40 MG tablet Take 1 tablet (40 mg total) by mouth 2 (two) times daily before a meal. 60 tablet 5  . potassium chloride SA (K-DUR,KLOR-CON) 20 MEQ tablet Take 20 mEq by mouth daily.     . rosuvastatin (CRESTOR) 20 MG tablet Take 20 mg by mouth daily.  6  . traMADol (ULTRAM) 50 MG tablet Take 50 mg by mouth every 4 (four) hours as needed (migraines).    . valsartan-hydrochlorothiazide (DIOVAN-HCT) 320-25 MG per tablet Take 1 tablet by mouth daily.       No current facility-administered medications for this visit.    Allergies:   Review of patient's allergies indicates no known allergies.    Social History:  The patient  reports that she quit smoking about 29 years ago. She has never used smokeless tobacco. She reports that she does not drink alcohol or use illicit drugs.   Family History:  The patient's family history includes Colon polyps in her brother; Heart disease in her father; Hyperlipidemia in her brother, mother, and sister; Hypertension in her brother, father, mother, and sister. There is no history of Colon cancer.    ROS:  Please see the history of present illness.   Otherwise, review of systems are positive for  Cough, hand puffiness, weight gain, leg pain, constipation, joint  swelling, headaches, easy bruising, dizziness, or muscle ache..   All other systems are reviewed and negative.    PHYSICAL EXAM: VS:  BP 136/88 mmHg  Pulse 80  Ht 5' 4.5" (1.638 m)  Wt 202 lb (91.627 kg)  BMI 34.15 kg/m2 , BMI Body mass index is 34.15 kg/(m^2). GEN: Well nourished, well developed, in no acute distress HEENT: normal Neck: no JVD, carotid bruits, or masses Cardiac: RRR.  There is no murmur, rub, or gallop. There is no edema. Respiratory:  clear to auscultation bilaterally, normal work of breathing. GI: soft, nontender, nondistended, + BS MS: no deformity or atrophy Skin: warm and dry, no rash Neuro:  Strength and sensation are intact Psych: euthymic mood, full affect   EKG:  EKG is not ordered today.    Recent Labs: No results found for requested labs within last 365 days.    Lipid Panel No results found for: CHOL, TRIG, HDL, CHOLHDL, VLDL, LDLCALC, LDLDIRECT    Wt Readings from Last 3 Encounters:  06/09/15 202 lb (91.627 kg)  04/13/15 202 lb (91.627 kg)  10/10/14 195 lb (88.451 kg)      Other studies Reviewed: Additional studies/ records that were reviewed today include:  Electronic health record. The findings include  Reviewed echo from 2016 which was normal..    ASSESSMENT AND PLAN:  1. Sick sinus syndrome (HCC)  stable with DDD pacemaker functioning normally when last checked  2. Essential hypertension  controlled  3. Hypertensive heart disease without heart failure  no evidence of LVH or systolic dysfunction by echo earlier  4. Pacemaker  recently documented normal function    Current medicines are reviewed at length with the patient today.  The patient has the following concerns regarding medicines: none.  The following changes/actions have been instituted:     aerobic activity  Labs/ tests ordered today include:  No orders of the defined types were placed in this encounter.     Disposition:   FU with HS in 1  year  Signed, Lesleigh Noe, MD  06/09/2015 11:57 AM    [facility] [address] [place], [place], [place]  [address] Phone: [phone]; Fax: [phone]

## 2015-06-09 NOTE — Patient Instructions (Signed)

## 2015-07-27 ENCOUNTER — Telehealth: Payer: Self-pay | Admitting: Cardiology

## 2015-07-27 ENCOUNTER — Ambulatory Visit (INDEPENDENT_AMBULATORY_CARE_PROVIDER_SITE_OTHER): Payer: BLUE CROSS/BLUE SHIELD | Admitting: *Deleted

## 2015-07-27 DIAGNOSIS — I495 Sick sinus syndrome: Secondary | ICD-10-CM | POA: Diagnosis not present

## 2015-07-27 NOTE — Progress Notes (Signed)
Remote pacemaker transmission.   

## 2015-07-27 NOTE — Telephone Encounter (Signed)
Pt daughter said pt didn't get home monitor after implant. I instructed her that her monitor was ordered today and that it will be mailed to their house. Pt daughter verbalized understanding.

## 2015-07-27 NOTE — Telephone Encounter (Signed)
LMOVM reminding pt to send remote transmission.   

## 2015-08-14 ENCOUNTER — Encounter: Payer: Self-pay | Admitting: Cardiology

## 2015-08-14 LAB — CUP PACEART REMOTE DEVICE CHECK
Brady Statistic AP VP Percent: 0 %
Brady Statistic AS VS Percent: 32 %
Implantable Lead Implant Date: 19970416
Implantable Lead Location: 753860
Implantable Lead Model: 5534
Lead Channel Impedance Value: 882 Ohm
Lead Channel Pacing Threshold Amplitude: 0.625 V
Lead Channel Pacing Threshold Amplitude: 0.875 V
Lead Channel Sensing Intrinsic Amplitude: 1.4 mV
Lead Channel Sensing Intrinsic Amplitude: 16 mV
Lead Channel Setting Pacing Amplitude: 2.5 V
Lead Channel Setting Pacing Pulse Width: 0.4 ms
Lead Channel Setting Sensing Sensitivity: 5.6 mV
MDC IDC LEAD IMPLANT DT: 19970416
MDC IDC LEAD LOCATION: 753859
MDC IDC MSMT BATTERY IMPEDANCE: 1216 Ohm
MDC IDC MSMT BATTERY REMAINING LONGEVITY: 42 mo
MDC IDC MSMT BATTERY VOLTAGE: 2.74 V
MDC IDC MSMT LEADCHNL RA IMPEDANCE VALUE: 871 Ohm
MDC IDC MSMT LEADCHNL RA PACING THRESHOLD PULSEWIDTH: 0.4 ms
MDC IDC MSMT LEADCHNL RV PACING THRESHOLD PULSEWIDTH: 0.4 ms
MDC IDC SESS DTM: 20170227190850
MDC IDC SET LEADCHNL RA PACING AMPLITUDE: 2 V
MDC IDC STAT BRADY AP VS PERCENT: 67 %
MDC IDC STAT BRADY AS VP PERCENT: 0 %

## 2015-12-04 ENCOUNTER — Encounter: Payer: BLUE CROSS/BLUE SHIELD | Admitting: Internal Medicine

## 2015-12-11 ENCOUNTER — Encounter: Payer: BLUE CROSS/BLUE SHIELD | Admitting: Internal Medicine

## 2015-12-25 ENCOUNTER — Ambulatory Visit (INDEPENDENT_AMBULATORY_CARE_PROVIDER_SITE_OTHER): Payer: BLUE CROSS/BLUE SHIELD | Admitting: Internal Medicine

## 2015-12-25 ENCOUNTER — Encounter: Payer: Self-pay | Admitting: Internal Medicine

## 2015-12-25 VITALS — BP 128/84 | HR 76 | Ht 64.0 in | Wt 200.0 lb

## 2015-12-25 DIAGNOSIS — Z950 Presence of cardiac pacemaker: Secondary | ICD-10-CM | POA: Diagnosis not present

## 2015-12-25 DIAGNOSIS — I495 Sick sinus syndrome: Secondary | ICD-10-CM | POA: Diagnosis not present

## 2015-12-25 DIAGNOSIS — I10 Essential (primary) hypertension: Secondary | ICD-10-CM

## 2015-12-25 LAB — CUP PACEART INCLINIC DEVICE CHECK
Brady Statistic AP VP Percent: 0 %
Brady Statistic AP VS Percent: 64 %
Brady Statistic AS VS Percent: 35 %
Date Time Interrogation Session: 20170728134736
Implantable Lead Implant Date: 19970416
Implantable Lead Location: 753859
Implantable Lead Location: 753860
Implantable Lead Model: 5534
Lead Channel Pacing Threshold Amplitude: 0.75 V
Lead Channel Sensing Intrinsic Amplitude: 2 mV
Lead Channel Sensing Intrinsic Amplitude: 22.4 mV
Lead Channel Setting Pacing Amplitude: 2 V
Lead Channel Setting Pacing Amplitude: 2.5 V
Lead Channel Setting Pacing Pulse Width: 0.4 ms
Lead Channel Setting Sensing Sensitivity: 5.6 mV
MDC IDC LEAD IMPLANT DT: 19970416
MDC IDC MSMT BATTERY IMPEDANCE: 1354 Ohm
MDC IDC MSMT BATTERY REMAINING LONGEVITY: 39 mo
MDC IDC MSMT BATTERY VOLTAGE: 2.74 V
MDC IDC MSMT LEADCHNL RA IMPEDANCE VALUE: 892 Ohm
MDC IDC MSMT LEADCHNL RA PACING THRESHOLD AMPLITUDE: 0.5 V
MDC IDC MSMT LEADCHNL RA PACING THRESHOLD PULSEWIDTH: 0.4 ms
MDC IDC MSMT LEADCHNL RV IMPEDANCE VALUE: 899 Ohm
MDC IDC MSMT LEADCHNL RV PACING THRESHOLD PULSEWIDTH: 0.4 ms
MDC IDC STAT BRADY AS VP PERCENT: 0 %

## 2015-12-25 NOTE — Progress Notes (Signed)
Electrophysiology Office Note   Date:  12/25/2015   ID:  Sylvia Anthony, DOB 01-Oct-1955, MRN [id number]  PCP:  Ignatius Specking, MD  Cardiologist:  Dr Katrinka Blazing Primary Electrophysiologist: Hillis Range, MD    CC; edema   History of Present Illness: Sylvia Anthony is a 60 y.o. female who presents today for electrophysiology evaluation.   Doing well at this time.   Remains active.   Occasional swellings in fingers and feet. Today, she denies symptoms of palpitations, chest pain, shortness of breath, orthopnea, PND, claudication, dizziness, presyncope, syncope, bleeding, or neurologic sequela. The patient is tolerating medications without difficulties and is otherwise without complaint today.    Past Medical History:  Diagnosis Date  . Anemia   . Arm numbness   . Arterial fibromuscular dysplasia (HCC)   . Carotid bruit   . CHF (congestive heart failure) (HCC) 1997   pacemaker  . Fibromyalgia   . GERD (gastroesophageal reflux disease)   . Glucose intolerance (impaired glucose tolerance)   . H. pylori infection Sept 2013   Amoxicillin, Biaxin  . Hyperlipidemia   . Hypertension   . Hypothyroidism   . Mitral valve prolapse   . Peripheral vascular disease (HCC)   . Rheumatic fever    age 60, was at [facility] for 17 weeks  . SSS (sick sinus syndrome) [facility])    Past Surgical History:  Procedure Laterality Date  . ABDOMINAL HYSTERECTOMY     partial  . APPENDECTOMY    . CARDIAC CATHETERIZATION     multiple  . CHOLECYSTECTOMY    . COLONOSCOPY  08/01/2006   3 mm descending colon polyp removed/8 mm sessile ascending colon polyp removed / 3-mm rectal  polyp removed /Rare sigmoid diverticulosis/ Moderate internal hemorrhoids.Advanced adenoma on colonoscopy in March 2008.  The polyp was anadenomatous polyp with a foci of high-grade dysplasia  . COLONOSCOPY  09/08/2008   SIMPLE ADENOMA/HYPERPLASTIC POLY/Multiple colon polyps (ascending, sigmoid, rectal)  Mild sigmoid colon  diverticulosis./ Small internal hemorrhoids  . COLONOSCOPY WITH ESOPHAGOGASTRODUODENOSCOPY (EGD)  Sept. 30, 2013   NWG:NFAO diverticulosis was noted in the sigmoid colon/The colon was otherwise normal/Small internal hemorrhoids/EGD:The mucosa of the esophagus appeared normal/Non-erosive gastritis (inflammation) was found; multiple bx/The duodenal mucosa showed no abnormalities. +H.pylori gastritis, treated with equivalent of prevpac. SAVARY DILATION  . ESOPHAGEAL DILATION N/A 10/07/2014   Procedure: ESOPHAGEAL DILATION;  Surgeon: West Bali, MD;  Location: [facility];  Service: Endoscopy;  Laterality: N/A;  . ESOPHAGOGASTRODUODENOSCOPY N/A 10/07/2014   Dr. [name]: moderate non-erosive gastritis, no definite stricture, empiric dilation . negative H.pylori   . growth removed from intestine     [facility]-as teenager, done through colonoscopy  . PACEMAKER GENERATOR CHANGE  09/18/2006   generator change by Dr Amil Amen with a MDT Adapta L  . PACEMAKER INSERTION  09/13/1995   for sick sinus syndome and syncope at [facility]  . right Achilles tendon     X 2  . TONSILLECTOMY       Current Outpatient Prescriptions  Medication Sig Dispense Refill  . albuterol (PROVENTIL HFA;VENTOLIN HFA) 108 (90 BASE) MCG/ACT inhaler Inhale 2 puffs into the lungs every 6 (six) hours as needed for wheezing or shortness of breath.    Marland Kitchen amLODipine (NORVASC) 10 MG tablet Take 10 mg by mouth daily.    Marland Kitchen amoxicillin (AMOXIL) 250 MG/5ML suspension Take 250 mg by mouth as needed. When she go for dental cleaning    . aspirin 325 MG  tablet Take 325 mg by mouth daily.    Marland Kitchen CALCIUM-MAGNESIUM-ZINC PO Take 1 tablet by mouth daily.     . cetirizine (ZYRTEC) 10 MG tablet Take 10 mg by mouth at bedtime as needed for allergies.     . cloNIDine (CATAPRES) 0.1 MG tablet Take 0.1 mg by mouth daily.    . Coenzyme Q10 (CO Q-10) 50 MG CAPS Take 1 capsule by mouth daily.    . diclofenac (VOLTAREN) 75 MG EC tablet Take 75 mg by  mouth daily as needed for mild pain.    Marland Kitchen dicyclomine (BENTYL) 10 MG capsule Take 10 mg by mouth daily.    Marland Kitchen gabapentin (NEURONTIN) 100 MG capsule Take 100 mg by mouth 2 (two) times daily.    Marland Kitchen HYDROcodone-acetaminophen (VICODIN) 5-500 MG per tablet Take 1 tablet by mouth every 6 (six) hours as needed for pain.    Marland Kitchen levothyroxine (SYNTHROID, LEVOTHROID) 100 MCG tablet Take 100 mcg by mouth daily.      Marland Kitchen lovastatin (MEVACOR) 40 MG tablet Take 40 mg by mouth at bedtime.    . nitroGLYCERIN (NITROSTAT) 0.4 MG SL tablet Place 0.4 mg under the tongue every 5 (five) minutes as needed for chest pain.    . pantoprazole (PROTONIX) 40 MG tablet Take 1 tablet (40 mg total) by mouth 2 (two) times daily before a meal. 60 tablet 5  . potassium chloride SA (K-DUR,KLOR-CON) 20 MEQ tablet Take 20 mEq by mouth daily.     . rosuvastatin (CRESTOR) 20 MG tablet Take 20 mg by mouth daily.  6  . traMADol (ULTRAM) 50 MG tablet Take 50 mg by mouth every 4 (four) hours as needed (migraines).    . valsartan-hydrochlorothiazide (DIOVAN-HCT) 320-25 MG per tablet Take 1 tablet by mouth daily.       No current facility-administered medications for this visit.     Allergies:   Review of patient's allergies indicates no known allergies.   Social History:  The patient  reports that she quit smoking about 30 years ago. She has a 1.00 pack-year smoking history. She has never used smokeless tobacco. She reports that she does not drink alcohol or use drugs.   Family History:  The patient's family history includes Colon polyps in her brother; Heart disease in her father; Hyperlipidemia in her brother, mother, and sister; Hypertension in her brother, father, mother, and sister.    ROS:  Please see the history of present illness.   All other systems are reviewed and negative.    PHYSICAL EXAM: VS:  BP 128/84   Pulse 76   Ht 5\' 4"  (1.626 m)   Wt 200 lb (90.7 kg)   SpO2 98%   BMI 34.33 kg/m  , BMI Body mass index is 34.33  kg/m. GEN: Well nourished, well developed, in no acute distress  HEENT: normal  Neck: no JVD, carotid bruits, or masses Cardiac: RRR; no murmurs, rubs, or gallops,no edema  Respiratory:  clear to auscultation bilaterally, normal work of breathing GI: soft, nontender, nondistended, + BS MS: no deformity or atrophy  Skin: warm and dry, device pocket is well healed Neuro:  Strength and sensation are intact Psych: euthymic mood, full affect  Device interrogation is reviewed today in detail.  See PaceArt for details.   Recent Labs: No results found for requested labs within last 8760 hours.    Lipid Panel  No results found for: CHOL, TRIG, HDL, CHOLHDL, VLDL, LDLCALC, LDLDIRECT   Wt Readings from Last 3 Encounters:  12/25/15 200 lb (90.7 kg)  06/09/15 202 lb (91.6 kg)  04/13/15 202 lb (91.6 kg)      Other studies Reviewed: Additional studies/ records that were reviewed today include: Dr Lonn Georgia last note    ASSESSMENT AND PLAN:  1.  Sick sinus syndrome Normal pacemaker function See Pace Art report No changes today  2. htn Stable No change required today  Follow-up: carelink, return to see me in 1 year Follow-up with Dr Katrinka Blazing as scheduled  Signed, Hillis Range, MD  12/25/2015 12:07 PM     [facility] [address] [place] [address] [phone] (office) [phone] (fax)

## 2015-12-25 NOTE — Patient Instructions (Addendum)
Medication Instructions:   Your physician recommends that you continue on your current medications as directed. Please refer to the Current Medication list given to you today.  Labwork: NONE  Testing/Procedures:  Device check from home on 03/28/16.  Follow-Up: Your physician recommends that you schedule a follow-up appointment in: 1 year with Dr. Allred. Please schedule this appointment today before leaving the office.   Any Other Special Instructions Will Be Listed Below (If Applicable).  If you need a refill on your cardiac medications before your next appointment, please call your pharmacy. 

## 2015-12-31 ENCOUNTER — Encounter: Payer: Self-pay | Admitting: Internal Medicine

## 2016-03-10 ENCOUNTER — Encounter: Payer: Self-pay | Admitting: Gastroenterology

## 2016-03-28 ENCOUNTER — Telehealth: Payer: Self-pay | Admitting: Cardiology

## 2016-03-28 ENCOUNTER — Ambulatory Visit (INDEPENDENT_AMBULATORY_CARE_PROVIDER_SITE_OTHER): Payer: BLUE CROSS/BLUE SHIELD | Admitting: *Deleted

## 2016-03-28 DIAGNOSIS — I495 Sick sinus syndrome: Secondary | ICD-10-CM | POA: Diagnosis not present

## 2016-03-28 NOTE — Telephone Encounter (Signed)
LMOVM reminding pt to send remote transmission.   

## 2016-03-29 NOTE — Progress Notes (Signed)
Remote pacemaker transmission.   

## 2016-04-01 ENCOUNTER — Encounter: Payer: Self-pay | Admitting: Cardiology

## 2016-04-07 LAB — CUP PACEART REMOTE DEVICE CHECK
Battery Impedance: 1411 Ohm
Battery Remaining Longevity: 37 mo
Battery Voltage: 2.74 V
Brady Statistic AP VS Percent: 62 %
Brady Statistic AS VP Percent: 0 %
Implantable Lead Implant Date: 19970416
Implantable Lead Location: 753860
Implantable Lead Model: 5034
Implantable Lead Model: 5534
Implantable Pulse Generator Implant Date: 20080421
Lead Channel Impedance Value: 855 Ohm
Lead Channel Pacing Threshold Amplitude: 0.625 V
Lead Channel Pacing Threshold Pulse Width: 0.4 ms
Lead Channel Pacing Threshold Pulse Width: 0.4 ms
Lead Channel Setting Pacing Amplitude: 2 V
Lead Channel Setting Pacing Amplitude: 2.5 V
Lead Channel Setting Pacing Pulse Width: 0.4 ms
Lead Channel Setting Sensing Sensitivity: 5.6 mV
MDC IDC LEAD IMPLANT DT: 19970416
MDC IDC LEAD LOCATION: 753859
MDC IDC MSMT LEADCHNL RV IMPEDANCE VALUE: 931 Ohm
MDC IDC MSMT LEADCHNL RV PACING THRESHOLD AMPLITUDE: 0.625 V
MDC IDC SESS DTM: 20171030190824
MDC IDC STAT BRADY AP VP PERCENT: 0 %
MDC IDC STAT BRADY AS VS PERCENT: 38 %

## 2016-06-27 ENCOUNTER — Encounter: Payer: BLUE CROSS/BLUE SHIELD | Admitting: *Deleted

## 2016-06-27 ENCOUNTER — Telehealth: Payer: Self-pay | Admitting: Cardiology

## 2016-06-27 NOTE — Telephone Encounter (Signed)
LMOVM reminding pt to send remote transmission.   

## 2016-06-30 ENCOUNTER — Encounter: Payer: Self-pay | Admitting: Cardiology

## 2016-07-04 ENCOUNTER — Encounter: Payer: Self-pay | Admitting: Nurse Practitioner

## 2016-07-04 ENCOUNTER — Ambulatory Visit: Payer: BLUE CROSS/BLUE SHIELD | Admitting: Gastroenterology

## 2016-07-04 ENCOUNTER — Ambulatory Visit (INDEPENDENT_AMBULATORY_CARE_PROVIDER_SITE_OTHER): Payer: BLUE CROSS/BLUE SHIELD | Admitting: Nurse Practitioner

## 2016-07-04 VITALS — BP 133/84 | HR 83 | Temp 98.0°F | Ht 64.5 in | Wt 199.0 lb

## 2016-07-04 DIAGNOSIS — R1310 Dysphagia, unspecified: Secondary | ICD-10-CM

## 2016-07-04 DIAGNOSIS — R1319 Other dysphagia: Secondary | ICD-10-CM

## 2016-07-04 DIAGNOSIS — K219 Gastro-esophageal reflux disease without esophagitis: Secondary | ICD-10-CM

## 2016-07-04 NOTE — Assessment & Plan Note (Signed)
GERD symptoms well controlled on once a day Protonix. Recommend continue Protonix, return for follow-up in one year or sooner if needed.

## 2016-07-04 NOTE — Assessment & Plan Note (Signed)
Denies recurrent dysphagia symptoms after esophageal dilation. Continue Protonix daily and return for follow-up in one year, or sooner if needed.

## 2016-07-04 NOTE — Progress Notes (Signed)
CC'D TO PCP °

## 2016-07-04 NOTE — Progress Notes (Signed)
Referring Provider: Ignatius Specking, MD Primary Care Physician:  Ignatius Specking, MD Primary GI:  Dr. [name]  Chief Complaint  Patient presents with  . Gastroesophageal Reflux    Protonix working well    HPI:   Sylvia Anthony is a 61 y.o. female who presents for follow-up on reflux. The patient was last seen in our office Sylvia Anthony 14 2016 for GERD. At that time it was noted her most recent EGD in May 2016 with dilation and noted gastritis which was negative for H. pylori. Next due for colonoscopy in 2019 (5 year recall) and is on the recall list. No further dysphagia, constipated with a bowel movement once a week without abdominal pain and sensation of bloating noted. Does not want to try prescription medicines yet. Recommended MiraLAX daily to every other day as needed patient will call if she would like prescription constipation medicine. Doing well on Protonix twice a day, we'll try once a day and if breakthrough symptoms resume twice a day dosing. Recommended return for follow-up in one year or sooner as needed.  Today she states she's doing well. Is on Protonix daily and this is working well for her. No breakthrough symptoms. Denies dysphagia recurrence. Denies abdominal pain, N/V, hematochezia, melena. Denies chest pain, dyspnea, dizziness, lightheadedness, syncope, near syncope. Denies any other upper or lower GI symptoms.  Past Medical History:  Diagnosis Date  . Anemia   . Arm numbness   . Arterial fibromuscular dysplasia (HCC)   . Carotid bruit   . CHF (congestive heart failure) (HCC) 1997   pacemaker  . Fibromyalgia   . GERD (gastroesophageal reflux disease)   . Glucose intolerance (impaired glucose tolerance)   . H. pylori infection Sept 2013   Amoxicillin, Biaxin  . Hyperlipidemia   . Hypertension   . Hypothyroidism   . Mitral valve prolapse   . Peripheral vascular disease (HCC)   . Rheumatic fever    age 79, was at [facility] for [facility] weeks  . SSS (sick sinus  syndrome) [facility])     Past Surgical History:  Procedure Laterality Date  . ABDOMINAL HYSTERECTOMY     partial  . APPENDECTOMY    . CARDIAC CATHETERIZATION     multiple  . CHOLECYSTECTOMY    . COLONOSCOPY  08/01/2006   3 mm descending colon polyp removed/8 mm sessile ascending colon polyp removed / 3-mm rectal  polyp removed /Rare sigmoid diverticulosis/ Moderate internal hemorrhoids.Advanced adenoma on colonoscopy in March 2008.  The polyp was anadenomatous polyp with a foci of high-grade dysplasia  . COLONOSCOPY  09/08/2008   SIMPLE ADENOMA/HYPERPLASTIC POLY/Multiple colon polyps (ascending, sigmoid, rectal)  Mild sigmoid colon diverticulosis./ Small internal hemorrhoids  . COLONOSCOPY WITH ESOPHAGOGASTRODUODENOSCOPY (EGD)  Sept. 30, 2013   ZOX:WRUE diverticulosis was noted in the sigmoid colon/The colon was otherwise normal/Small internal hemorrhoids/EGD:The mucosa of the esophagus appeared normal/Non-erosive gastritis (inflammation) was found; multiple bx/The duodenal mucosa showed no abnormalities. +H.pylori gastritis, treated with equivalent of prevpac. SAVARY DILATION  . ESOPHAGEAL DILATION N/A 10/07/2014   Procedure: ESOPHAGEAL DILATION;  Surgeon: West Bali, MD;  [facility];  Service: Endoscopy;  Laterality: N/A;  . ESOPHAGOGASTRODUODENOSCOPY N/A 10/07/2014   Dr. [name]: moderate non-erosive gastritis, no definite stricture, empiric dilation . negative H.pylori   . growth removed from intestine     [facility]-as teenager, done through colonoscopy  . PACEMAKER GENERATOR CHANGE  09/18/2006   generator change by Dr Amil Amen with a MDT Adapta L  .  PACEMAKER INSERTION  09/13/1995   [facility] syndome [facility]  . right Achilles tendon     X 2  . TONSILLECTOMY      Current Outpatient Prescriptions  Medication Sig Dispense Refill  . albuterol (PROVENTIL HFA;VENTOLIN HFA) 108 (90 BASE) MCG/ACT inhaler Inhale 2 puffs into the lungs every 6 (six)  hours as needed for wheezing or shortness of breath.    Marland Kitchen. amLODipine (NORVASC) 10 MG tablet Take 10 mg by mouth daily.    Marland Kitchen. aspirin 325 MG tablet Take 325 mg by mouth daily.    Marland Kitchen. CALCIUM-MAGNESIUM-ZINC PO Take 1 tablet by mouth daily.     . cetirizine (ZYRTEC) 10 MG tablet Take 10 mg by mouth at bedtime as needed for allergies.     Marland Kitchen. diclofenac (VOLTAREN) 75 MG EC tablet Take 75 mg by mouth daily as needed for mild pain.    Marland Kitchen. dicyclomine (BENTYL) 10 MG capsule Take 10 mg by mouth daily.    Marland Kitchen. HYDROcodone-acetaminophen (VICODIN) 5-500 MG per tablet Take 1 tablet by mouth every 6 (six) hours as needed for pain.    Marland Kitchen. levothyroxine (SYNTHROID, LEVOTHROID) 100 MCG tablet Take 100 mcg by mouth daily.      Marland Kitchen. lovastatin (MEVACOR) 40 MG tablet Take 40 mg by mouth at bedtime.    . pantoprazole (PROTONIX) 40 MG tablet Take 1 tablet (40 mg total) by mouth 2 (two) times daily before a meal. 60 tablet 5  . potassium chloride SA (K-DUR,KLOR-CON) 20 MEQ tablet Take 20 mEq by mouth daily.     . traMADol (ULTRAM) 50 MG tablet Take 50 mg by mouth every 4 (four) hours as needed (migraines).    . valsartan-hydrochlorothiazide (DIOVAN-HCT) 320-25 MG per tablet Take 1 tablet by mouth daily.      . nitroGLYCERIN (NITROSTAT) 0.4 MG SL tablet Place 0.4 mg under the tongue every 5 (five) minutes as needed for chest pain.     No current facility-administered medications for this visit.     Allergies as of 07/04/2016  . (No Known Allergies)    Family History  Problem Relation Age of Onset  . Hyperlipidemia Mother   . Hypertension Mother   . Heart disease Father   . Hypertension Father   . Hyperlipidemia Sister   . Hypertension Sister   . Hyperlipidemia Brother   . Hypertension Brother   . Colon polyps Brother   . Colon cancer Neg Hx     Social History   Social History  . Marital status: Married    Spouse name: N/A  . Number of children: 1  . Years of education: N/A   Occupational History  . part-time  hairdresser    Social History Main Topics  . Smoking status: Former Smoker    Packs/day: 0.25    Years: 4.00    Quit date: 10/09/1985  . Smokeless tobacco: Never Used     Comment: just in teens  . Alcohol use No  . Drug use: No  . Sexual activity: Not Asked   Other Topics Concern  . None   Social History Narrative   [place] in [place].  Son is healthy at age 63.   Disabled          Review of Systems: Complete ROS negative except as per HPI.   Physical Exam: BP 133/84   Pulse 83   Temp 98 F (36.7 C) (Oral)   Ht 5' 4.5" (1.638 m)  Wt 199 lb (90.3 kg)   BMI 33.63 kg/m  General:   Alert and oriented. Pleasant and cooperative. Well-nourished and well-developed.  Eyes:  Without icterus, sclera clear and conjunctiva pink.  Ears:  Normal auditory acuity. Cardiovascular:  S1, S2 present without murmurs appreciated. Extremities without clubbing or edema. Respiratory:  Clear to auscultation bilaterally. No wheezes, rales, or rhonchi. No distress.  Gastrointestinal:  +BS, rounded but soft, non-tender and non-distended. No HSM noted. No guarding or rebound. No masses appreciated.  Rectal:  Deferred  Musculoskalatal:  Symmetrical without gross deformities. Neurologic:  Alert and oriented x4;  grossly normal neurologically. Psych:  Alert and cooperative. Normal mood and affect. Heme/Lymph/Immune: No excessive bruising noted.    07/04/2016 10:30 AM   Disclaimer: This note was dictated with voice recognition software. Similar sounding words can inadvertently be transcribed and may not be corrected upon review.

## 2016-07-04 NOTE — Patient Instructions (Signed)
1. Keep taking Protonix once a day. 2. Return for follow-up in one year, or sooner if you need to for any recurrent or worsening symptoms. 3. If you need a refill on Protonix notify your pharmacy and they can send us a refill request.

## 2016-07-06 ENCOUNTER — Ambulatory Visit (INDEPENDENT_AMBULATORY_CARE_PROVIDER_SITE_OTHER): Payer: BLUE CROSS/BLUE SHIELD | Admitting: *Deleted

## 2016-07-06 DIAGNOSIS — I495 Sick sinus syndrome: Secondary | ICD-10-CM

## 2016-07-07 NOTE — Progress Notes (Signed)
Remote pacemaker transmission.   

## 2016-07-08 ENCOUNTER — Encounter: Payer: Self-pay | Admitting: Cardiology

## 2016-07-08 LAB — CUP PACEART REMOTE DEVICE CHECK
Battery Remaining Longevity: 34 mo
Brady Statistic AP VP Percent: 0 %
Brady Statistic AS VS Percent: 37 %
Date Time Interrogation Session: 20180207125347
Implantable Lead Implant Date: 19970416
Implantable Lead Implant Date: 19970416
Implantable Lead Location: 753859
Implantable Lead Model: 5034
Implantable Lead Model: 5534
Implantable Pulse Generator Implant Date: 20080421
Lead Channel Impedance Value: 855 Ohm
Lead Channel Impedance Value: 950 Ohm
Lead Channel Pacing Threshold Amplitude: 0.75 V
Lead Channel Pacing Threshold Pulse Width: 0.4 ms
Lead Channel Setting Pacing Amplitude: 2 V
Lead Channel Setting Sensing Sensitivity: 5.6 mV
MDC IDC LEAD LOCATION: 753860
MDC IDC MSMT BATTERY IMPEDANCE: 1581 Ohm
MDC IDC MSMT BATTERY VOLTAGE: 2.74 V
MDC IDC MSMT LEADCHNL RA PACING THRESHOLD AMPLITUDE: 0.625 V
MDC IDC MSMT LEADCHNL RA PACING THRESHOLD PULSEWIDTH: 0.4 ms
MDC IDC SET LEADCHNL RV PACING AMPLITUDE: 2.5 V
MDC IDC SET LEADCHNL RV PACING PULSEWIDTH: 0.4 ms
MDC IDC STAT BRADY AP VS PERCENT: 62 %
MDC IDC STAT BRADY AS VP PERCENT: 0 %

## 2016-08-09 ENCOUNTER — Encounter: Payer: Self-pay | Admitting: Interventional Cardiology

## 2016-08-14 NOTE — Progress Notes (Signed)
Cardiology Office Note    Date:  08/15/2016   ID:  Sylvia Anthony, DOB [birth year], MRN [id number]  PCP:  Ignatius Specking, MD  Cardiologist: Lesleigh Noe, MD   Chief Complaint  Patient presents with  . Follow-up    BP / pacer    History of Present Illness:  Sylvia Anthony is a 61 y.o. female  who presents for  hypertension, hyperlipidemia, history of fibromuscular dysplasia, sick sinus syndrome with permanent pacemaker 1997 for SSS.  Doing well. Occasional left lower extremity swelling. States that her husband occasionally notices that she struggles to breathe while asleep. She denies chest pain, orthopnea, PND, and palpitations.  Past Medical History:  Diagnosis Date  . Anemia   . Arm numbness   . Arterial fibromuscular dysplasia (HCC)   . Carotid bruit   . CHF (congestive heart failure) (HCC) 1997   pacemaker  . Fibromyalgia   . GERD (gastroesophageal reflux disease)   . Glucose intolerance (impaired glucose tolerance)   . H. pylori infection Sept 2013   Amoxicillin, Biaxin  . Hyperlipidemia   . Hypertension   . Hypothyroidism   . Mitral valve prolapse   . Peripheral vascular disease (HCC)   . Rheumatic fever    age 21, was at [facility] weeks  . SSS (sick sinus syndrome) [facility])     Past Surgical History:  Procedure Laterality Date  . ABDOMINAL HYSTERECTOMY     partial  . APPENDECTOMY    . CARDIAC CATHETERIZATION     multiple  . CHOLECYSTECTOMY    . COLONOSCOPY  08/01/2006   3 mm descending colon polyp removed/8 mm sessile ascending colon polyp removed / 3-mm rectal  polyp removed /Rare sigmoid diverticulosis/ Moderate internal hemorrhoids.Advanced adenoma on colonoscopy in March 2008.  The polyp was anadenomatous polyp with a foci of high-grade dysplasia  . COLONOSCOPY  09/08/2008   SIMPLE ADENOMA/HYPERPLASTIC POLY/Multiple colon polyps (ascending, sigmoid, rectal)  Mild sigmoid colon diverticulosis./ Small internal hemorrhoids  .  COLONOSCOPY WITH ESOPHAGOGASTRODUODENOSCOPY (EGD)  Sept. 30, 2013   JXB:JYNW diverticulosis was noted in the sigmoid colon/The colon was otherwise normal/Small internal hemorrhoids/EGD:The mucosa of the esophagus appeared normal/Non-erosive gastritis (inflammation) was found; multiple bx/The duodenal mucosa showed no abnormalities. +H.pylori gastritis, treated with equivalent of prevpac. SAVARY DILATION  . ESOPHAGEAL DILATION N/A 10/07/2014   Procedure: ESOPHAGEAL DILATION;  Surgeon: West Bali, MD;  [facility];  Service: Endoscopy;  Laterality: N/A;  . ESOPHAGOGASTRODUODENOSCOPY N/A 10/07/2014   Dr. [name]: moderate non-erosive gastritis, no definite stricture, empiric dilation . negative H.pylori   . growth removed from intestine     [facility]-as teenager, done through colonoscopy  . PACEMAKER GENERATOR CHANGE  09/18/2006   generator change by Dr Amil Amen with a MDT Adapta L  . PACEMAKER INSERTION  09/13/1995   for sick sinus syndome and syncope at [facility]  . right Achilles tendon     X 2  . TONSILLECTOMY      Current Medications: Outpatient Medications Prior to Visit  Medication Sig Dispense Refill  . albuterol (PROVENTIL HFA;VENTOLIN HFA) 108 (90 BASE) MCG/ACT inhaler Inhale 2 puffs into the lungs every 6 (six) hours as needed for wheezing or shortness of breath.    Marland Kitchen amLODipine (NORVASC) 10 MG tablet Take 10 mg by mouth daily.    Marland Kitchen aspirin 325 MG tablet Take 325 mg by mouth daily.    . cetirizine (ZYRTEC) 10 MG tablet Take 10  mg by mouth at bedtime as needed for allergies.     Marland Kitchen. diclofenac (VOLTAREN) 75 MG EC tablet Take 75 mg by mouth daily as needed for mild pain.    Marland Kitchen. dicyclomine (BENTYL) 10 MG capsule Take 10 mg by mouth daily.    Marland Kitchen. HYDROcodone-acetaminophen (VICODIN) 5-500 MG per tablet Take 1 tablet by mouth every 6 (six) hours as needed for pain.    Marland Kitchen. levothyroxine (SYNTHROID, LEVOTHROID) 100 MCG tablet Take 100 mcg by mouth daily.      . nitroGLYCERIN  (NITROSTAT) 0.4 MG SL tablet Place 0.4 mg under the tongue every 5 (five) minutes as needed for chest pain.    . pantoprazole (PROTONIX) 40 MG tablet Take 1 tablet (40 mg total) by mouth 2 (two) times daily before a meal. 60 tablet 5  . potassium chloride SA (K-DUR,KLOR-CON) 20 MEQ tablet Take 20 mEq by mouth daily.     . traMADol (ULTRAM) 50 MG tablet Take 50 mg by mouth every 4 (four) hours as needed (migraines).    . valsartan-hydrochlorothiazide (DIOVAN-HCT) 320-25 MG per tablet Take 1 tablet by mouth daily.      Marland Kitchen. CALCIUM-MAGNESIUM-ZINC PO Take 1 tablet by mouth daily.     Marland Kitchen. lovastatin (MEVACOR) 40 MG tablet Take 40 mg by mouth at bedtime.     No facility-administered medications prior to visit.      Allergies:   Patient has no known allergies.   Social History   Social History  . Marital status: Married    Spouse name: N/A  . Number of children: 1  . Years of education: N/A   Occupational History  . part-time hairdresser    Social History Main Topics  . Smoking status: Former Smoker    Packs/day: 0.25    Years: 4.00    Quit date: 10/09/1985  . Smokeless tobacco: Never Used     Comment: just in teens  . Alcohol use No  . Drug use: No  . Sexual activity: Not Asked   Other Topics Concern  . None   Social History Narrative   [place]ives in [place].  Son is healthy at age 541.   Disabled           Family History:  The patient's  family history includes Colon polyps in her brother; Heart disease in her father; Hyperlipidemia in her brother, mother, and sister; Hypertension in her brother, father, mother, and sister.   ROS:   Please see the history of present illness.    Muscle pain, easy bruising, unexplained weight gain, left greater than right leg swelling, constipation, and joint swelling. Occasional headaches.  All other systems reviewed and are negative.   PHYSICAL EXAM:   VS:  BP 136/88 (BP Location: Left Arm)   Pulse 70   Ht 5' 4.5" (1.638 m)   Wt 200  lb 3.2 oz (90.8 kg)   BMI 33.83 kg/m    GEN: Well nourished, well developed, in no acute distress  HEENT: normal  Neck: no JVD, carotid bruits, or masses Cardiac: RRR; no murmurs, rubs, or gallops,no edema  Respiratory:  clear to auscultation bilaterally, normal work of breathing GI: soft, nontender, nondistended, + BS MS: no deformity or atrophy  Skin: warm and dry, no rash Neuro:  Alert and Oriented x 3, Strength and sensation are intact Psych: euthymic mood, full affect  Wt Readings from Last 3 Encounters:  08/15/16 200 lb 3.2 oz (90.8 kg)  07/04/16 199 lb (90.3 kg)  12/25/15 200 lb (90.7 kg)      Studies/Labs Reviewed:   EKG:  EKG  Atrial pacing, prominent voltage consistent with LVH and strain.  Recent Labs: No results found for requested labs within last 8760 hours.   Lipid Panel No results found for: CHOL, TRIG, HDL, CHOLHDL, VLDL, LDLCALC, LDLDIRECT  Additional studies/ records that were reviewed today include:  No new data. Recent pacemaker check was unremarkable.    ASSESSMENT:    1. Sick sinus syndrome (HCC)   2. Cerebrovascular accident (CVA) due to thrombosis of precerebral artery (HCC)   3. Hypertensive heart disease without heart failure   4. Essential hypertension   5. Pacemaker      PLAN:  In order of problems listed above:  1. Stable without symptoms. No tachycardia/palpitations. Normally functioning pacemaker. 2. No recurrent neurological complaints. 3. Excellent blood pressure control. Target 140/90 mmHg S. 4.  As noted. 2 g sodium diet.  Overall clinical follow-up in one year. No change in therapy. We'll order a sleep study if her husband confirms that she is having difficulty with breathing all asleep. This could be contributing to most of her medical problems.      Medication Adjustments/Labs and Tests Ordered: Current medicines are reviewed at length with the patient today.  Concerns regarding medicines are outlined above.   Medication changes, Labs and Tests ordered today are listed in the Patient Instructions below. There are no Patient Instructions on file for this visit.   Signed, Lesleigh Noe, MD  08/15/2016 9:23 AM    [facility] [address] [place], [place], [place]  [address] Phone: [phone]; Fax: [phone]

## 2016-08-15 ENCOUNTER — Encounter: Payer: Self-pay | Admitting: Interventional Cardiology

## 2016-08-15 ENCOUNTER — Encounter (INDEPENDENT_AMBULATORY_CARE_PROVIDER_SITE_OTHER): Payer: Self-pay

## 2016-08-15 ENCOUNTER — Ambulatory Visit (INDEPENDENT_AMBULATORY_CARE_PROVIDER_SITE_OTHER): Payer: BLUE CROSS/BLUE SHIELD | Admitting: Interventional Cardiology

## 2016-08-15 VITALS — BP 136/88 | HR 70 | Ht 64.5 in | Wt 200.2 lb

## 2016-08-15 DIAGNOSIS — I119 Hypertensive heart disease without heart failure: Secondary | ICD-10-CM

## 2016-08-15 DIAGNOSIS — Z950 Presence of cardiac pacemaker: Secondary | ICD-10-CM

## 2016-08-15 DIAGNOSIS — I495 Sick sinus syndrome: Secondary | ICD-10-CM

## 2016-08-15 DIAGNOSIS — I6300 Cerebral infarction due to thrombosis of unspecified precerebral artery: Secondary | ICD-10-CM

## 2016-08-15 DIAGNOSIS — I10 Essential (primary) hypertension: Secondary | ICD-10-CM

## 2016-08-15 MED ORDER — NITROGLYCERIN 0.4 MG SL SUBL: 0.4000 mg | SUBLINGUAL_TABLET | SUBLINGUAL | 3 refills | Status: DC | PRN

## 2016-08-15 NOTE — Patient Instructions (Signed)
Medication Instructions:  None  Labwork: None  Testing/Procedures: None  Follow-Up: Your physician wants you to follow-up in: 1 year with Dr. Katrinka BlazingSmith. You will receive a reminder letter in the mail two months in advance. If you don't receive a letter, please call our office to schedule the follow-up appointment.   Any Other Special Instructions Will Be Listed Below (If Applicable).  Your physician discussed the importance of regular exercise and recommended that you start or continue a regular exercise program for good health.  Have your husband monitor your breathing while you are sleeping.  If you have any loud snoring or episodes where it appears you stop breathing for a short period, please contact our office.   If you need a refill on your cardiac medications before your next appointment, please call your pharmacy.

## 2016-10-05 ENCOUNTER — Encounter: Payer: BLUE CROSS/BLUE SHIELD | Admitting: *Deleted

## 2016-10-05 ENCOUNTER — Telehealth: Payer: Self-pay | Admitting: Cardiology

## 2016-10-05 NOTE — Telephone Encounter (Signed)
LMOVM reminding pt to send remote transmission.   

## 2016-10-07 ENCOUNTER — Encounter: Payer: Self-pay | Admitting: Cardiology

## 2016-10-07 NOTE — Progress Notes (Signed)
letter

## 2016-10-17 ENCOUNTER — Ambulatory Visit (INDEPENDENT_AMBULATORY_CARE_PROVIDER_SITE_OTHER): Payer: BLUE CROSS/BLUE SHIELD | Admitting: *Deleted

## 2016-10-17 DIAGNOSIS — I495 Sick sinus syndrome: Secondary | ICD-10-CM

## 2016-10-17 NOTE — Progress Notes (Signed)
Remote pacemaker transmission.   

## 2016-10-18 LAB — CUP PACEART REMOTE DEVICE CHECK
Battery Impedance: 1696 Ohm
Battery Voltage: 2.73 V
Brady Statistic AP VP Percent: 0 %
Brady Statistic AP VS Percent: 62 %
Brady Statistic AS VP Percent: 0 %
Implantable Lead Implant Date: 19970416
Implantable Lead Location: 753859
Implantable Lead Location: 753860
Implantable Lead Model: 5034
Implantable Lead Model: 5534
Implantable Pulse Generator Implant Date: 20080421
Lead Channel Impedance Value: 858 Ohm
Lead Channel Impedance Value: 932 Ohm
Lead Channel Pacing Threshold Amplitude: 0.625 V
Lead Channel Pacing Threshold Amplitude: 0.75 V
Lead Channel Pacing Threshold Pulse Width: 0.4 ms
Lead Channel Setting Pacing Amplitude: 2.5 V
Lead Channel Setting Pacing Pulse Width: 0.4 ms
Lead Channel Setting Sensing Sensitivity: 5.6 mV
MDC IDC LEAD IMPLANT DT: 19970416
MDC IDC MSMT BATTERY REMAINING LONGEVITY: 32 mo
MDC IDC MSMT LEADCHNL RV PACING THRESHOLD PULSEWIDTH: 0.4 ms
MDC IDC SESS DTM: 20180521112648
MDC IDC SET LEADCHNL RA PACING AMPLITUDE: 2 V
MDC IDC STAT BRADY AS VS PERCENT: 38 %

## 2016-10-19 ENCOUNTER — Encounter: Payer: Self-pay | Admitting: Cardiology

## 2016-12-02 ENCOUNTER — Encounter: Payer: BLUE CROSS/BLUE SHIELD | Admitting: Internal Medicine

## 2016-12-23 ENCOUNTER — Encounter: Payer: Self-pay | Admitting: Internal Medicine

## 2016-12-23 ENCOUNTER — Ambulatory Visit (INDEPENDENT_AMBULATORY_CARE_PROVIDER_SITE_OTHER): Payer: BLUE CROSS/BLUE SHIELD | Admitting: Internal Medicine

## 2016-12-23 VITALS — BP 132/88 | HR 84 | Ht 64.5 in | Wt 200.0 lb

## 2016-12-23 DIAGNOSIS — I10 Essential (primary) hypertension: Secondary | ICD-10-CM | POA: Diagnosis not present

## 2016-12-23 DIAGNOSIS — I495 Sick sinus syndrome: Secondary | ICD-10-CM | POA: Diagnosis not present

## 2016-12-23 LAB — CUP PACEART INCLINIC DEVICE CHECK
Battery Impedance: 1782 Ohm
Battery Voltage: 2.72 V
Brady Statistic AP VP Percent: 0 %
Brady Statistic AP VS Percent: 61 %
Brady Statistic AS VP Percent: 0 %
Date Time Interrogation Session: 20180727133933
Implantable Lead Implant Date: 19970416
Implantable Lead Location: 753860
Implantable Lead Model: 5034
Implantable Lead Model: 5534
Implantable Pulse Generator Implant Date: 20080421
Lead Channel Impedance Value: 873 Ohm
Lead Channel Impedance Value: 907 Ohm
Lead Channel Pacing Threshold Amplitude: 0.625 V
Lead Channel Pacing Threshold Amplitude: 0.875 V
Lead Channel Pacing Threshold Pulse Width: 0.4 ms
Lead Channel Sensing Intrinsic Amplitude: 2 mV
Lead Channel Sensing Intrinsic Amplitude: 22.4 mV
Lead Channel Setting Pacing Amplitude: 2 V
Lead Channel Setting Pacing Amplitude: 2.5 V
Lead Channel Setting Pacing Pulse Width: 0.4 ms
MDC IDC LEAD IMPLANT DT: 19970416
MDC IDC LEAD LOCATION: 753859
MDC IDC MSMT BATTERY REMAINING LONGEVITY: 30 mo
MDC IDC MSMT LEADCHNL RA PACING THRESHOLD AMPLITUDE: 0.75 V
MDC IDC MSMT LEADCHNL RA PACING THRESHOLD PULSEWIDTH: 0.4 ms
MDC IDC MSMT LEADCHNL RV PACING THRESHOLD AMPLITUDE: 0.75 V
MDC IDC MSMT LEADCHNL RV PACING THRESHOLD PULSEWIDTH: 0.4 ms
MDC IDC MSMT LEADCHNL RV PACING THRESHOLD PULSEWIDTH: 0.4 ms
MDC IDC SET LEADCHNL RV SENSING SENSITIVITY: 5.6 mV
MDC IDC STAT BRADY AS VS PERCENT: 39 %

## 2016-12-23 MED ORDER — ASPIRIN EC 81 MG PO TBEC: 81.0000 mg | DELAYED_RELEASE_TABLET | Freq: Every day | ORAL | 3 refills | Status: DC

## 2016-12-23 NOTE — Patient Instructions (Addendum)
Medication Instructions:   Your physician has recommended you make the following change in your medication:   Decrease aspirin to 81 mg daily.  Continue all other medications the same.  Labwork:  NONE  Testing/Procedures:  NONE  Follow-Up: Your physician recommends that you schedule a follow-up appointment in: 1 year. Please schedule this appointment today before leaving the office.  Any Other Special Instructions Will Be Listed Below (If Applicable).  Your next device check from home is on 03/27/17.  Your doctor has recommended that you follow a 2000 mg low sodium diet per day. Please use the information given to you today as a guideline.  Low-Sodium Eating Plan Sodium, which is an element that makes up salt, helps you maintain a healthy balance of fluids in your body. Too much sodium can increase your blood pressure and cause fluid and waste to be held in your body. Your health care provider or dietitian may recommend following this plan if you have high blood pressure (hypertension), kidney disease, liver disease, or heart failure. Eating less sodium can help lower your blood pressure, reduce swelling, and protect your heart, liver, and kidneys. What are tips for following this plan? General guidelines  Most people on this plan should limit their sodium intake to 1500-2,000 mg (milligrams) of sodium each day. Reading food labels  The Nutrition Facts label lists the amount of sodium in one serving of the food. If you eat more than one serving, you must multiply the listed amount of sodium by the number of servings.  Choose foods with less than 140 mg of sodium per serving.    Avoid foods with 300 mg of sodium or more per serving. Shopping  Look for lower-sodium products, often labeled as "low-sodium" or "no salt added."  Always check the sodium content even if foods are labeled as "unsalted" or "no salt added".  Buy fresh foods. ? Avoid canned foods and premade or  frozen meals. ? Avoid canned, cured, or processed meats  Buy breads that have less than 80 mg of sodium per slice. Cooking  Eat more home-cooked food and less restaurant, buffet, and fast food.  Avoid adding salt when cooking. Use salt-free seasonings or herbs instead of table salt or sea salt. Check with your health care provider or pharmacist before using salt substitutes.  Cook with plant-based oils, such as canola, sunflower, or olive oil. Meal planning  When eating at a restaurant, ask that your food be prepared with less salt or no salt, if possible.  Avoid foods that contain MSG (monosodium glutamate). MSG is sometimes added to Congohinese food, bouillon, and some canned foods. What foods are recommended? The items listed may not be a complete list. Talk with your dietitian about what dietary choices are best for you. Grains Low-sodium cereals, including oats, puffed wheat and rice, and shredded wheat. Low-sodium crackers. Unsalted rice. Unsalted pasta. Low-sodium bread. Whole-grain breads and whole-grain pasta. Vegetables Fresh or frozen vegetables. "No salt added" canned vegetables. "No salt added" tomato sauce and paste. Low-sodium or reduced-sodium tomato and vegetable juice. Fruits Fresh, frozen, or canned fruit. Fruit juice. Meats and other protein foods Fresh or frozen (no salt added) meat, poultry, seafood, and fish. Low-sodium canned tuna and salmon. Unsalted nuts. Dried peas, beans, and lentils without added salt. Unsalted canned beans. Eggs. Unsalted nut butters. Dairy Milk. Soy milk. Cheese that is naturally low in sodium, such as ricotta cheese, fresh mozzarella, or Swiss cheese Low-sodium or reduced-sodium cheese. Cream cheese. Yogurt. Fats and  oils Unsalted butter. Unsalted margarine with no trans fat. Vegetable oils such as canola or olive oils. Seasonings and other foods Fresh and dried herbs and spices. Salt-free seasonings. Low-sodium mustard and ketchup.  Sodium-free salad dressing. Sodium-free light mayonnaise. Fresh or refrigerated horseradish. Lemon juice. Vinegar. Homemade, reduced-sodium, or low-sodium soups. Unsalted popcorn and pretzels. Low-salt or salt-free chips. What foods are not recommended? The items listed may not be a complete list. Talk with your dietitian about what dietary choices are best for you. Grains Instant hot cereals. Bread stuffing, pancake, and biscuit mixes. Croutons. Seasoned rice or pasta mixes. Noodle soup cups. Boxed or frozen macaroni and cheese. Regular salted crackers. Self-rising flour. Vegetables Sauerkraut, pickled vegetables, and relishes. Olives. [place] fries. Onion rings. Regular canned vegetables (not low-sodium or reduced-sodium). Regular canned tomato sauce and paste (not low-sodium or reduced-sodium). Regular tomato and vegetable juice (not low-sodium or reduced-sodium). Frozen vegetables in sauces. Meats and other protein foods Meat or fish that is salted, canned, smoked, spiced, or pickled. Bacon, ham, sausage, hotdogs, corned beef, chipped beef, packaged lunch meats, salt pork, jerky, pickled herring, anchovies, regular canned tuna, sardines, salted nuts. Dairy Processed cheese and cheese spreads. Cheese curds. Blue cheese. Feta cheese. String cheese. Regular cottage cheese. Buttermilk. Canned milk. Fats and oils Salted butter. Regular margarine. Ghee. Bacon fat. Seasonings and other foods Onion salt, garlic salt, seasoned salt, table salt, and sea salt. Canned and packaged gravies. Worcestershire sauce. Tartar sauce. Barbecue sauce. Teriyaki sauce. Soy sauce, including reduced-sodium. Steak sauce. Fish sauce. Oyster sauce. Cocktail sauce. Horseradish that you find on the shelf. Regular ketchup and mustard. Meat flavorings and tenderizers. Bouillon cubes. Hot sauce and Tabasco sauce. Premade or packaged marinades. Premade or packaged taco seasonings. Relishes. Regular salad dressings. Salsa. Potato and  tortilla chips. Corn chips and puffs. Salted popcorn and pretzels. Canned or dried soups. Pizza. Frozen entrees and pot pies. Summary  Eating less sodium can help lower your blood pressure, reduce swelling, and protect your heart, liver, and kidneys.  Most people on this plan should limit their sodium intake to 2,000 mg (milligrams) of sodium each day.  Canned, boxed, and frozen foods are high in sodium. Restaurant foods, fast foods, and pizza are also very high in sodium. You also get sodium by adding salt to food.  Try to cook at home, eat more fresh fruits and vegetables, and eat less fast food, canned, processed, or prepared foods. This information is not intended to replace advice given to you by your health care provider. Make sure you discuss any questions you have with your health care provider. Document Released: 11/05/2001 Document Revised: 05/09/2016 Document Reviewed: 05/09/2016 Elsevier Interactive Patient Education  2017 [employer]    If you need a refill on your cardiac medications before your next appointment, please call your pharmacy.

## 2016-12-23 NOTE — Progress Notes (Signed)
PCP: Ignatius SpeckingVyas, Dhruv B, MD Primary Cardiologist:  Dr Tora KindredSmith  Sylvia Anthony is a 61 y.o. female who presents today for routine electrophysiology followup.  Since last being seen in our clinic, the patient reports doing very well. She remains active.  Today, she denies symptoms of palpitations, chest pain, shortness of breath,  lower extremity edema, dizziness, presyncope, or syncope.  The patient is otherwise without complaint today.   Past Medical History:  Diagnosis Date  . Anemia   . Arm numbness   . Arterial fibromuscular dysplasia (HCC)   . Carotid bruit   . CHF (congestive heart failure) (HCC) 1997   pacemaker  . Fibromyalgia   . GERD (gastroesophageal reflux disease)   . Glucose intolerance (impaired glucose tolerance)   . H. pylori infection Sept 2013   Amoxicillin, Biaxin  . Hyperlipidemia   . Hypertension   . Hypothyroidism   . Mitral valve prolapse   . Peripheral vascular disease (HCC)   . Rheumatic fever    age 89, [facility]  . SSS (sick sinus syndrome) [facility](HCC)    Past Surgical History:  Procedure Laterality Date  . ABDOMINAL HYSTERECTOMY     partial  . APPENDECTOMY    . CARDIAC CATHETERIZATION     multiple  . CHOLECYSTECTOMY    . COLONOSCOPY  08/01/2006   3 mm descending colon polyp removed/8 mm sessile ascending colon polyp removed / 3-mm rectal  polyp removed /Rare sigmoid diverticulosis/ Moderate internal hemorrhoids.Advanced adenoma on colonoscopy in March 2008.  The polyp was anadenomatous polyp with a foci of high-grade dysplasia  . COLONOSCOPY  09/08/2008   SIMPLE ADENOMA/HYPERPLASTIC POLY/Multiple colon polyps (ascending, sigmoid, rectal)  Mild sigmoid colon diverticulosis./ Small internal hemorrhoids  . COLONOSCOPY WITH ESOPHAGOGASTRODUODENOSCOPY (EGD)  Sept. 30, 2013   ZOX:WRUESLF:Mild diverticulosis was noted in the sigmoid colon/The colon was otherwise normal/Small internal hemorrhoids/EGD:The mucosa of the esophagus appeared  normal/Non-erosive gastritis (inflammation) was found; multiple bx/The duodenal mucosa showed no abnormalities. +H.pylori gastritis, treated with equivalent of prevpac. SAVARY DILATION  . ESOPHAGEAL DILATION N/A 10/07/2014   Procedure: ESOPHAGEAL DILATION;  Surgeon: West BaliSandi L Fields, MD;  [facility];  Service: Endoscopy;  Laterality: N/A;  . ESOPHAGOGASTRODUODENOSCOPY N/A 10/07/2014   Dr. Darrick PennaFields: moderate non-erosive gastritis, no definite stricture, empiric dilation . negative H.pylori   . growth removed from intestine     [facility]-as teenager, done through colonoscopy  . PACEMAKER GENERATOR CHANGE  09/18/2006   generator change by Dr Amil AmenEdmunds with a MDT Adapta L  . PACEMAKER INSERTION  09/13/1995   [facility]ome [facility]  . right Achilles tendon     X 2  . TONSILLECTOMY      ROS- all systems are reviewed and negative except as per HPI above  Current Outpatient Prescriptions  Medication Sig Dispense Refill  . albuterol (PROVENTIL HFA;VENTOLIN HFA) 108 (90 BASE) MCG/ACT inhaler Inhale 2 puffs into the lungs every 6 (six) hours as needed for wheezing or shortness of breath.    Marland Kitchen. amLODipine (NORVASC) 10 MG tablet Take 10 mg by mouth daily.    Marland Kitchen. aspirin 325 MG tablet Take 325 mg by mouth daily.    . cetirizine (ZYRTEC) 10 MG tablet Take 10 mg by mouth at bedtime as needed for allergies.     Marland Kitchen. diclofenac (VOLTAREN) 75 MG EC tablet Take 75 mg by mouth daily as needed for mild pain.    Marland Kitchen. dicyclomine (BENTYL)  10 MG capsule Take 10 mg by mouth daily.    Marland Kitchen. HYDROcodone-acetaminophen (VICODIN) 5-500 MG per tablet Take 1 tablet by mouth every 6 (six) hours as needed for pain.    Marland Kitchen. levothyroxine (SYNTHROID, LEVOTHROID) 100 MCG tablet Take 100 mcg by mouth daily.      . nitroGLYCERIN (NITROSTAT) 0.4 MG SL tablet Place 1 tablet (0.4 mg total) under the tongue every 5 (five) minutes as needed for chest pain. 25 tablet 3  . pantoprazole (PROTONIX) 40 MG tablet Take  1 tablet (40 mg total) by mouth 2 (two) times daily before a meal. 60 tablet 5  . potassium chloride SA (K-DUR,KLOR-CON) 20 MEQ tablet Take 20 mEq by mouth daily.     . rosuvastatin (CRESTOR) 20 MG tablet Take 20 mg by mouth daily.  6  . traMADol (ULTRAM) 50 MG tablet Take 50 mg by mouth every 4 (four) hours as needed (migraines).    . valsartan-hydrochlorothiazide (DIOVAN-HCT) 320-25 MG per tablet Take 1 tablet by mouth daily.       No current facility-administered medications for this visit.     Physical Exam: Vitals:   12/23/16 0911  BP: 132/88  Pulse: 84  SpO2: 97%  Weight: 200 lb (90.7 kg)  Height: 5' 4.5" (1.638 m)    GEN- The patient is well appearing, alert and oriented x 3 today.   Head- normocephalic, atraumatic Eyes-  Sclera clear, conjunctiva pink Ears- hearing intact Oropharynx- clear Lungs- Clear to ausculation bilaterally, normal work of breathing Chest- pacemaker pocket is well healed Heart- Regular rate and rhythm, no murmurs, rubs or gallops, PMI not laterally displaced GI- soft, NT, ND, + BS Extremities- no clubbing, cyanosis, or edema  Pacemaker interrogation- reviewed in detail today,  See PACEART report  Assessment and Plan:  1. Symptomatic sinus bradycardia/ sick sinus syndrome Normal pacemaker function See Pace Art report No changes today  2. HTN Stable No change required today Sodium restriction encouraged  3. ? Prior stroke She is on ASA 325mg  daily for unclear reasons.  She reports increased bleeding and also takes occasional NSAIDs She is not sure why she is on ASA 325mg  daily.  She says that Dr Katrinka BlazingSmith told her that she had a prior stroke, though she is unaware of this.  This is mentioned in Dr Michaelle CopasSmith's note from March 2018.  I will reach out to Dr Katrinka BlazingSmith to see if she still requires high dose ASA. I have reduced ASA to 81mg  daily in the interim.   Follow-up: carelink, return to see me in 1 year Follow-up with Dr Katrinka BlazingSmith as scheduled  Hillis RangeJames  Anchor Dwan MD, [facility] 12/23/2016 9:51 AM

## 2017-03-27 ENCOUNTER — Telehealth: Payer: Self-pay | Admitting: Internal Medicine

## 2017-03-27 ENCOUNTER — Ambulatory Visit (INDEPENDENT_AMBULATORY_CARE_PROVIDER_SITE_OTHER): Payer: BLUE CROSS/BLUE SHIELD | Admitting: *Deleted

## 2017-03-27 ENCOUNTER — Telehealth: Payer: Self-pay | Admitting: Cardiology

## 2017-03-27 DIAGNOSIS — I495 Sick sinus syndrome: Secondary | ICD-10-CM

## 2017-03-27 NOTE — Telephone Encounter (Signed)
LMOVM reminding pt to send remote transmission.   

## 2017-03-27 NOTE — Telephone Encounter (Signed)
Patient Walk In   Patient can not get transmission to go through.   Also received a [employer] today in mail.  Please call patient about what to do

## 2017-03-27 NOTE — Telephone Encounter (Signed)
Patient states that she attempted to send her transmission using the old 2490 monitor. I told patient that she would need to use the 24952 that she received today. Verbal instructions provided. Patient verbalized understanding and states that she will try to send in the am.

## 2017-03-31 NOTE — Progress Notes (Signed)
Remote pacemaker transmission.   

## 2017-04-03 LAB — CUP PACEART REMOTE DEVICE CHECK
Battery Impedance: 1933 Ohm
Battery Remaining Longevity: 27 mo
Battery Voltage: 2.73 V
Brady Statistic AP VP Percent: 0 %
Brady Statistic AP VS Percent: 71 %
Brady Statistic AS VP Percent: 0 %
Brady Statistic AS VS Percent: 29 %
Implantable Lead Implant Date: 19970416
Implantable Lead Location: 753860
Implantable Lead Model: 5034
Implantable Lead Model: 5534
Implantable Pulse Generator Implant Date: 20080421
Lead Channel Impedance Value: 824 Ohm
Lead Channel Pacing Threshold Amplitude: 0.625 V
Lead Channel Pacing Threshold Amplitude: 0.75 V
Lead Channel Pacing Threshold Pulse Width: 0.4 ms
Lead Channel Pacing Threshold Pulse Width: 0.4 ms
Lead Channel Sensing Intrinsic Amplitude: 1.4 mV
Lead Channel Setting Pacing Amplitude: 2.5 V
Lead Channel Setting Pacing Pulse Width: 0.4 ms
MDC IDC LEAD IMPLANT DT: 19970416
MDC IDC LEAD LOCATION: 753859
MDC IDC MSMT LEADCHNL RV IMPEDANCE VALUE: 902 Ohm
MDC IDC MSMT LEADCHNL RV SENSING INTR AMPL: 16 mV
MDC IDC SESS DTM: 20181031002814
MDC IDC SET LEADCHNL RA PACING AMPLITUDE: 2 V
MDC IDC SET LEADCHNL RV SENSING SENSITIVITY: 5.6 mV

## 2017-04-04 ENCOUNTER — Encounter: Payer: Self-pay | Admitting: Cardiology

## 2017-05-31 ENCOUNTER — Encounter: Payer: Self-pay | Admitting: Gastroenterology

## 2017-06-22 ENCOUNTER — Other Ambulatory Visit (HOSPITAL_COMMUNITY): Payer: Self-pay | Admitting: Internal Medicine

## 2017-06-22 ENCOUNTER — Ambulatory Visit (HOSPITAL_COMMUNITY)
Admission: RE | Admit: 2017-06-22 | Discharge: 2017-06-22 | Disposition: A | Discharge status: Home / Self Care | Payer: Medicare Other | Source: Ambulatory Visit | Attending: Internal Medicine | Admitting: Internal Medicine

## 2017-06-22 DIAGNOSIS — M79604 Pain in right leg: Secondary | ICD-10-CM | POA: Insufficient documentation

## 2017-06-29 ENCOUNTER — Telehealth: Payer: Self-pay | Admitting: Cardiology

## 2017-06-29 ENCOUNTER — Ambulatory Visit (INDEPENDENT_AMBULATORY_CARE_PROVIDER_SITE_OTHER): Payer: Medicare Other | Admitting: *Deleted

## 2017-06-29 DIAGNOSIS — I495 Sick sinus syndrome: Secondary | ICD-10-CM | POA: Diagnosis not present

## 2017-06-29 NOTE — Telephone Encounter (Signed)
Confirmed remote transmission w/ pt family member.   

## 2017-06-30 ENCOUNTER — Encounter: Payer: Self-pay | Admitting: Cardiology

## 2017-06-30 NOTE — Progress Notes (Signed)
Remote pacemaker transmission.   

## 2017-07-14 LAB — CUP PACEART REMOTE DEVICE CHECK
Battery Impedance: 1994 Ohm
Battery Voltage: 2.72 V
Brady Statistic AP VS Percent: 65 %
Date Time Interrogation Session: 20190201021224
Implantable Lead Implant Date: 19970416
Implantable Lead Implant Date: 19970416
Implantable Lead Location: 753859
Implantable Lead Location: 753860
Implantable Lead Model: 5034
Lead Channel Pacing Threshold Pulse Width: 0.4 ms
Lead Channel Pacing Threshold Pulse Width: 0.4 ms
Lead Channel Setting Pacing Amplitude: 2 V
Lead Channel Setting Pacing Amplitude: 2.5 V
Lead Channel Setting Pacing Pulse Width: 0.4 ms
Lead Channel Setting Sensing Sensitivity: 5.6 mV
MDC IDC MSMT BATTERY REMAINING LONGEVITY: 27 mo
MDC IDC MSMT LEADCHNL RA IMPEDANCE VALUE: 807 Ohm
MDC IDC MSMT LEADCHNL RA PACING THRESHOLD AMPLITUDE: 0.625 V
MDC IDC MSMT LEADCHNL RV IMPEDANCE VALUE: 897 Ohm
MDC IDC MSMT LEADCHNL RV PACING THRESHOLD AMPLITUDE: 0.75 V
MDC IDC PG IMPLANT DT: 20080421
MDC IDC STAT BRADY AP VP PERCENT: 0 %
MDC IDC STAT BRADY AS VP PERCENT: 0 %
MDC IDC STAT BRADY AS VS PERCENT: 35 %

## 2017-08-14 ENCOUNTER — Encounter: Payer: Self-pay | Admitting: Nurse Practitioner

## 2017-08-14 ENCOUNTER — Ambulatory Visit: Payer: Medicare Other | Admitting: Nurse Practitioner

## 2017-08-14 VITALS — BP 140/88 | HR 64 | Ht 64.5 in | Wt 204.8 lb

## 2017-08-14 DIAGNOSIS — Z950 Presence of cardiac pacemaker: Secondary | ICD-10-CM

## 2017-08-14 NOTE — Progress Notes (Signed)
CARDIOLOGY OFFICE NOTE  Date:  08/14/2017    Sylvia Anthony Date of Birth: [birth year] Medical Record #[id number]  PCP:  Ignatius Specking, MD  Cardiologist:  Smith/Allred   Chief Complaint  Patient presents with  . Edema  . Hypertension  . Hyperlipidemia    Follow up visit - seen for Dr. Carleene Mains    History of Present Illness: Sylvia [name] who presents today for a follow up visit. Seen for Dr. Johney Frame and Dr. Katrinka Blazing.   She has a history of hypertension, hyperlipidemia, history of fibromuscular dysplasia, sick sinus syndrome with permanent pacemaker dating back 63 for SSS. Generator change was around 2004.   Last seen by Dr. Katrinka Blazing a year ago. Saw Dr. Johney Frame in July of 2018.   Comes in today. Here alone. Says it is time to replace her device. She says Dr. Johney Frame "told me it would be this year". She is sluggish. Says her heart rate has changed - sometimes goes slower and sometimes faster. No chest pain. She will have some numbness in both arms and tingling in her fingers over the last month. No exertional symptoms. 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 This mostly happens at rest. Just had labs with her PCP last week - says that was all good. BP typically a little lower at home. She is on chronic Voltaren for OA. No chest pain. Tries to walk when the weather is good.   Past Medical History:  Diagnosis Date  . Anemia   . Arm numbness   . Arterial fibromuscular dysplasia (HCC)   . Carotid bruit   . CHF (congestive heart failure) (HCC) 1997   pacemaker  . Fibromyalgia   . GERD (gastroesophageal reflux disease)   . Glucose intolerance (impaired glucose tolerance)   . H. pylori infection Sept 2013   Amoxicillin, Biaxin  . Hyperlipidemia   . Hypertension   . Hypothyroidism   . Mitral valve prolapse   . Peripheral vascular disease (HCC)   . Rheumatic fever    age 47, was at [facility]  . SSS [facility]sick  sinus syndrome) [facility]     Past Surgical History:  Procedure Laterality Date  . ABDOMINAL HYSTERECTOMY     partial  . APPENDECTOMY    . CARDIAC CATHETERIZATION     multiple  . CHOLECYSTECTOMY    . COLONOSCOPY  08/01/2006   3 mm descending colon polyp removed/8 mm sessile ascending colon polyp removed / 3-mm rectal  polyp removed /Rare sigmoid diverticulosis/ Moderate internal hemorrhoids.Advanced adenoma on colonoscopy in March 2008.  The polyp was anadenomatous polyp with a foci of high-grade dysplasia  . COLONOSCOPY  09/08/2008   SIMPLE ADENOMA/HYPERPLASTIC POLY/Multiple colon polyps (ascending, sigmoid, rectal)  Mild sigmoid colon diverticulosis./ Small internal hemorrhoids  . COLONOSCOPY WITH ESOPHAGOGASTRODUODENOSCOPY (EGD)  Sept. 30, 2013   WUJ:WJXB diverticulosis was noted in the sigmoid colon/The colon was otherwise normal/Small internal hemorrhoids/EGD:The mucosa of the esophagus appeared normal/Non-erosive gastritis (inflammation) was found; multiple bx/The duodenal mucosa showed no abnormalities. +H.pylori gastritis, treated with equivalent of prevpac. SAVARY DILATION  . ESOPHAGEAL DILATION N/A 10/07/2014   Procedure: ESOPHAGEAL DILATION;  Surgeon: West Bali, MD;  Location[facility];  Service: Endoscopy;  Laterality: N/A;  . ESOPHAGOGASTRODUODENOSCOPY N/A 10/07/2014   Dr. [name]: moderate non-erosive gastritis, no definite stricture, empiric dilation . negative H.pylori   . growth removed from intestine     [facility]-as  teenager, done through colonoscopy  . PACEMAKER GENERATOR CHANGE  09/18/2006   generator change by Dr Amil AmenEdmunds with a MDT Adapta L  . PACEMAKER INSERTION  09/13/1995   [facility]  . right Achilles tendon     X 2  . TONSILLECTOMY       Medications: Current Meds  Medication Sig  . albuterol (PROVENTIL HFA;VENTOLIN HFA) 108 (90 BASE) MCG/ACT inhaler Inhale 2 puffs into the lungs every 6 (six) hours as needed  for wheezing or shortness of breath.  Marland Kitchen. amLODipine (NORVASC) 10 MG tablet Take 10 mg by mouth daily.  Marland Kitchen. aspirin EC 81 MG tablet Take 1 tablet (81 mg total) by mouth daily.  . cetirizine (ZYRTEC) 10 MG tablet Take 10 mg by mouth at bedtime as needed for allergies.   Marland Kitchen. diclofenac (VOLTAREN) 75 MG EC tablet Take 75 mg by mouth daily as needed for mild pain.  Marland Kitchen. dicyclomine (BENTYL) 10 MG capsule Take 10 mg by mouth daily.  Marland Kitchen. HYDROcodone-acetaminophen (VICODIN) 5-500 MG per tablet Take 1 tablet by mouth every 6 (six) hours as needed for pain.  Marland Kitchen. levothyroxine (SYNTHROID, LEVOTHROID) 100 MCG tablet Take 100 mcg by mouth daily.    . nitroGLYCERIN (NITROSTAT) 0.4 MG SL tablet Place 1 tablet (0.4 mg total) under the tongue every 5 (five) minutes as needed for chest pain.  . pantoprazole (PROTONIX) 40 MG tablet Take 1 tablet (40 mg total) by mouth 2 (two) times daily before a meal.  . potassium chloride SA (K-DUR,KLOR-CON) 20 MEQ tablet Take 20 mEq by mouth daily.   . rosuvastatin (CRESTOR) 20 MG tablet Take 20 mg by mouth daily.  . traMADol (ULTRAM) 50 MG tablet Take 50 mg by mouth every 4 (four) hours as needed (migraines).  . valsartan-hydrochlorothiazide (DIOVAN-HCT) 320-25 MG per tablet Take 1 tablet by mouth daily.       Allergies: Allergies  Allergen Reactions  . Gabapentin     SWELLING "OUT OF IT" 12/23/16    Social History: The patient  reports that she quit smoking about 31 years ago. She has a 1.00 pack-year smoking history. she has never used smokeless tobacco. She reports that she does not drink alcohol or use drugs.   Family History: The patient's family history includes Colon polyps in her brother; Heart disease in her father; Hyperlipidemia in her brother, mother, and sister; Hypertension in her brother, father, mother, and sister.   Review of Systems: Please see the history of present illness.   Otherwise, the review of systems is positive for none.   All other systems are  reviewed and negative.   Physical Exam: VS:  BP 140/88 (BP Location: Left Arm, Patient Position: Sitting, Cuff Size: Large)   Pulse 64   Ht 5' 4.5" (1.638 m)   Wt 204 lb 12.8 oz (92.9 kg)   BMI 34.61 kg/m  .  BMI Body mass index is 34.61 kg/m.  Wt Readings from Last 3 Encounters:  08/14/17 204 lb 12.8 oz (92.9 kg)  12/23/16 200 lb (90.7 kg)  08/15/16 200 lb 3.2 oz (90.8 kg)    General: Pleasant. Well developed, well nourished and in no acute distress.   HEENT: Normal.  Neck: Supple, no JVD, carotid bruits, or masses noted.  Cardiac: Regular rate and rhythm. No murmurs, rubs, or gallops. No edema.  Respiratory:  Lungs are clear to auscultation bilaterally with normal work of breathing.  GI: Soft and nontender.  MS: No  deformity or atrophy. Gait and ROM intact.  Skin: Warm and dry. Color is normal.  Neuro:  Strength and sensation are intact and no gross focal deficits noted.  Psych: Alert, appropriate and with normal affect.   LABORATORY DATA:  EKG:  EKG is ordered today. This demonstrates A pacing with underlying SR - diffuse T wave changes - unchanged - HR is 64.  Lab Results  Component Value Date   HGB 13.8 07/22/2008   HCT 44.9 07/22/2008     BNP (last 3 results) No results for input(s): BNP in the last 8760 hours.  ProBNP (last 3 results) No results for input(s): PROBNP in the last 8760 hours.   Other Studies Reviewed Today:   Assessment/Plan:  1. Underlying PPM - for SSS - told by the device clinic that her remote was done in January and that there was still 2 years of battery life - she is going to download from home tonight and I will review tomorrow.  2. Bilateral arm numbness - she has great 2+ pulses - suspect carpal tunnel - would advise hand splints.   3. HTN - fair control - she monitors her BP at home - I suspect some of this is due to her chronic NSAID use. Labs are checked by PCP.   4. Prior stroke.   Current medicines are reviewed with the  patient today.  The patient does not have concerns regarding medicines other than what has been noted above.  The following changes have been made:  See above.  Labs/ tests ordered today include:   No orders of the defined types were placed in this encounter.    Disposition:   FU with Dr. Katrinka Blazing in one year. Keep planned follow up with Dr. Johney Frame.   Patient is agreeable to this plan and will call if any problems develop in the interim.   SignedNorma Fredrickson, NP  08/14/2017 4:19 PM  [facility] [address] [place], [place]  [address] Phone: [phone] Fax: [phone]

## 2017-08-14 NOTE — Patient Instructions (Signed)
We will be checking the following labs today - NONE   Medication Instructions:    Continue with your current medicines.     Testing/Procedures To Be Arranged:  N/A  Follow-Up:   See Dr. Katrinka BlazingSmith in one year  See Dr. Johney FrameAllred as planned    Other Special Instructions:   Transmit your pacemaker for me tonight - we will call you about this tomorrow  Keep a check on your BP for us  Limit use of Voltaren/Diclofenac  Hand splints at night may help     If you need a refill on your cardiac medications before your next appointment, please call your pharmacy.   Call the [facility] office at [phone] if you have any questions, problems or concerns.

## 2017-08-15 ENCOUNTER — Telehealth: Payer: Self-pay | Admitting: *Deleted

## 2017-08-15 NOTE — Telephone Encounter (Signed)
S/w pt's husband per (DPR) is aware of pt remote home transmission, that Lawson FiscalLori advised looks good.  Kept transmission in records folder.

## 2017-08-15 NOTE — Telephone Encounter (Signed)
lvm for pt to call back.  Received home transmission from pt last night, Lawson FiscalLori reviewed and called to give pt results.

## 2017-08-15 NOTE — Telephone Encounter (Signed)
Follow Up:     Returning your call from today,please call her on her cell phone.

## 2017-09-18 ENCOUNTER — Encounter: Payer: Self-pay | Admitting: Gastroenterology

## 2017-09-18 ENCOUNTER — Other Ambulatory Visit: Payer: Self-pay

## 2017-09-18 ENCOUNTER — Ambulatory Visit: Payer: Medicare Other | Admitting: Gastroenterology

## 2017-09-18 VITALS — BP 135/86 | HR 76 | Temp 97.0°F | Ht 64.5 in | Wt 201.4 lb

## 2017-09-18 DIAGNOSIS — Z86010 Personal history of colonic polyps: Secondary | ICD-10-CM | POA: Diagnosis not present

## 2017-09-18 DIAGNOSIS — K219 Gastro-esophageal reflux disease without esophagitis: Secondary | ICD-10-CM | POA: Diagnosis not present

## 2017-09-18 MED ORDER — CLENPIQ 10-3.5-12 MG-GM -GM/160ML PO SOLN: 1.0000 | Freq: Once | ORAL | 0 refills | Status: AC

## 2017-09-18 NOTE — Patient Instructions (Signed)
We have scheduled you for a colonoscopy with Dr. Darrick PennaFields in the near future.  Continue Protonix. Let us know if any problems swallowing in the future!  We will see you back in 1 year or sooner if needed!  It was a pleasure to see you today. I strive to create trusting relationships with patients to provide genuine, compassionate, and quality care. I value your feedback. If you receive a survey regarding your visit,  I greatly appreciate you taking time to fill this out.   Gelene MinkAnna W. Kyal Arts, PhD, ANP-BC [facility]

## 2017-09-18 NOTE — Progress Notes (Addendum)
REVIEWED-NO ADDITIONAL RECOMMENDATIONS.  Primary Care Physician:  Ignatius Specking, MD  Primary GI: Dr. [name]   Chief Complaint  Patient presents with  . Colonoscopy    due for 5 yr tcs  . Gastroesophageal Reflux    doing ok    HPI:   Sylvia [name] presenting today with a history of GERD, dysphagia, colon polyps. Last colonoscopy in 2013 and due again in 2018; she is overdue. Colonoscopy in 2013 without polyps. Colonoscopy in 2008 with advanced adenoma with foci of high-grade dysplasia.   Occasional constipation, will take something OTC. Will take Miralax and sometimes dulcolax. No dysphagia. No rectal bleeding. No abdominal pain. Protonix BID.   Rare hydrocodone. Not every day thing. Tramadol for migraines prn. Doesn't like taking medication. States just a small amount goes a long way. Did well with conscious sedation last procedure.   Past Medical History:  Diagnosis Date  . Anemia   . Arm numbness   . Arterial fibromuscular dysplasia (HCC)   . Carotid bruit   . CHF (congestive heart failure) (HCC) 1997   pacemaker  . Fibromyalgia   . GERD (gastroesophageal reflux disease)   . Glucose intolerance (impaired glucose tolerance)   . H. pylori infection Sept 2013   Amoxicillin, Biaxin  . Hyperlipidemia   . Hypertension   . Hypothyroidism   . Mitral valve prolapse   . Peripheral vascular disease (HCC)   . Rheumatic fever    age 19, was at [facility] for 17 weeks  . SSS (sick sinus syndrome) [facility])     Past Surgical History:  Procedure Laterality Date  . ABDOMINAL HYSTERECTOMY     partial  . APPENDECTOMY    . CARDIAC CATHETERIZATION     multiple  . CHOLECYSTECTOMY    . COLONOSCOPY  08/01/2006   3 mm descending colon polyp removed/8 mm sessile ascending colon polyp removed / 3-mm rectal  polyp removed /Rare sigmoid diverticulosis/ Moderate internal hemorrhoids.Advanced adenoma on colonoscopy in March 2008.  The polyp was anadenomatous polyp  with a foci of high-grade dysplasia  . COLONOSCOPY  09/08/2008   SIMPLE ADENOMA/HYPERPLASTIC POLY/Multiple colon polyps (ascending, sigmoid, rectal)  Mild sigmoid colon diverticulosis./ Small internal hemorrhoids  . COLONOSCOPY WITH ESOPHAGOGASTRODUODENOSCOPY (EGD)  Sept. 30, 2013   ZOX:WRUE diverticulosis was noted in the sigmoid colon/The colon was otherwise normal/Small internal hemorrhoids/EGD:The mucosa of the esophagus appeared normal/Non-erosive gastritis (inflammation) was found; multiple bx/The duodenal mucosa showed no abnormalities. +H.pylori gastritis, treated with equivalent of prevpac. SAVARY DILATION  . ESOPHAGEAL DILATION N/A 10/07/2014   Procedure: ESOPHAGEAL DILATION;  Surgeon: West Bali, MD;  Location: [facility];  Service: Endoscopy;  Laterality: N/A;  . ESOPHAGOGASTRODUODENOSCOPY N/A 10/07/2014   Dr. [name]: moderate non-erosive gastritis, no definite stricture, empiric dilation . negative H.pylori   . growth removed from intestine     [facility]-as teenager, done through colonoscopy  . PACEMAKER GENERATOR CHANGE  09/18/2006   generator change by Dr Amil Amen with a MDT Adapta L  . PACEMAKER INSERTION  09/13/1995   for sick sinus syndome and syncope at [facility]  . right Achilles tendon     X 2  . TONSILLECTOMY      Current Outpatient Medications  Medication Sig Dispense Refill  . albuterol (PROVENTIL HFA;VENTOLIN HFA) 108 (90 BASE) MCG/ACT inhaler Inhale 2 puffs into the lungs every 6 (six) hours as needed for wheezing or shortness of breath.    Marland Kitchen amLODipine (NORVASC) 10  MG tablet Take 10 mg by mouth daily.    Marland Kitchen. aspirin EC 81 MG tablet Take 1 tablet (81 mg total) by mouth daily. 90 tablet 3  . cetirizine (ZYRTEC) 10 MG tablet Take 10 mg by mouth at bedtime as needed for allergies.     Marland Kitchen. diclofenac (VOLTAREN) 75 MG EC tablet Take 75 mg by mouth daily as needed for mild pain.    Marland Kitchen. HYDROcodone-acetaminophen (VICODIN) 5-500 MG per tablet Take 1 tablet by mouth  every 6 (six) hours as needed for pain.    Marland Kitchen. levothyroxine (SYNTHROID, LEVOTHROID) 100 MCG tablet Take 100 mcg by mouth daily.      . nitroGLYCERIN (NITROSTAT) 0.4 MG SL tablet Place 1 tablet (0.4 mg total) under the tongue every 5 (five) minutes as needed for chest pain. 25 tablet 3  . pantoprazole (PROTONIX) 40 MG tablet Take 1 tablet (40 mg total) by mouth 2 (two) times daily before a meal. 60 tablet 5  . potassium chloride SA (K-DUR,KLOR-CON) 20 MEQ tablet Take 20 mEq by mouth daily.     . rosuvastatin (CRESTOR) 20 MG tablet Take 20 mg by mouth daily.  6  . traMADol (ULTRAM) 50 MG tablet Take 50 mg by mouth every 4 (four) hours as needed (migraines).    . valsartan-hydrochlorothiazide (DIOVAN-HCT) 320-25 MG per tablet Take 1 tablet by mouth daily.      Marland Kitchen. dicyclomine (BENTYL) 10 MG capsule Take 10 mg by mouth daily.     No current facility-administered medications for this visit.     Allergies as of 09/18/2017 - Review Complete 09/18/2017  Allergen Reaction Noted  . Gabapentin  12/23/2016    Family History  Problem Relation Age of Onset  . Hyperlipidemia Mother   . Hypertension Mother   . Heart disease Father   . Hypertension Father   . Hyperlipidemia Sister   . Hypertension Sister   . Hyperlipidemia Brother   . Hypertension Brother   . Colon polyps Brother   . Colon cancer Neg Hx     Social History   Socioeconomic History  . Marital status: Married    Spouse name: Not on file  . Number of children: 1  . Years of education: Not on file  . Highest education level: Not on file  Occupational History  . Occupation: part-time hairdresser  Social Needs  . Financial resource strain: Not on file  . Food insecurity:    Worry: Not on file    Inability: Not on file  . Transportation needs:    Medical: Not on file    Non-medical: Not on file  Tobacco Use  . Smoking status: Former Smoker    Packs/day: 0.25    Years: 4.00    Pack years: 1.00    Last attempt to quit:  10/09/1985    Years since quitting: 31.9  . Smokeless tobacco: Never Used  . Tobacco comment: just in teens  Substance and Sexual Activity  . Alcohol use: No    Alcohol/week: 0.0 oz  . Drug use: No  . Sexual activity: Not on file  Lifestyle  . Physical activity:    Days per week: Not on file    Minutes per session: Not on file  . Stress: Not on file  Relationships  . Social connections:    Talks on phone: Not on file    Gets together: Not on file    Attends religious service: Not on file    Active member of club or  organization: Not on file    Attends meetings of clubs or organizations: Not on file    Relationship status: Not on file  Other Topics Concern  . Not on file  Social History Narrative   Lives in [place] with spouse.  Son is healthy at age 34.   Disabled          Review of Systems: Gen: Denies fever, chills, anorexia. Denies fatigue, weakness, weight loss.  CV: Denies chest pain, palpitations, syncope, peripheral edema, and claudication. Resp: Denies dyspnea at rest, cough, wheezing, coughing up blood, and pleurisy. GI: see HPI  Derm: Denies rash, itching, dry skin Psych: Denies depression, anxiety, memory loss, confusion. No homicidal or suicidal ideation.  Heme: Denies bruising, bleeding, and enlarged lymph nodes.  Physical Exam: BP 135/86   Pulse 76   Temp (!) 97 F (36.1 C) (Oral)   Ht 5' 4.5" (1.638 m)   Wt 201 lb 6.4 oz (91.4 kg)   BMI 34.04 kg/m  General:   Alert and oriented. No distress noted. Pleasant and cooperative.  Head:  Normocephalic and atraumatic. Eyes:  Conjuctiva clear without scleral icterus. Mouth:  Oral mucosa pink and moist.  Lungs: clear to auscultation bilaterally Cardiac: S1 S2 present without murmurs  Abdomen:  +BS, soft, non-tender and non-distended. No rebound or guarding. No HSM or masses noted. Msk:  Symmetrical without gross deformities. Normal posture. Extremities:  Without edema. Neurologic:  Alert and  oriented  x4 Psych:  Alert and cooperative. Normal mood and affect.

## 2017-09-19 NOTE — Assessment & Plan Note (Signed)
Continue with Protonix. 1 year follow-up.

## 2017-09-19 NOTE — Progress Notes (Signed)
cc'ed to pcp °

## 2017-09-19 NOTE — Assessment & Plan Note (Signed)
62 year old female with history of advanced adenoma in 2008, last colonoscopy 2013 without polyps. Due for 5-year-surveillance now. No concerning lower or upper GI symptoms.  Proceed with colonoscopy with Dr. Darrick PennaFields in the near future. The risks, benefits, and alternatives have been discussed in detail with the patient. They state understanding and desire to proceed.  As of note: has pacemaker

## 2017-09-28 ENCOUNTER — Ambulatory Visit (INDEPENDENT_AMBULATORY_CARE_PROVIDER_SITE_OTHER): Payer: Medicare Other | Admitting: *Deleted

## 2017-09-28 ENCOUNTER — Telehealth: Payer: Self-pay | Admitting: Cardiology

## 2017-09-28 DIAGNOSIS — I495 Sick sinus syndrome: Secondary | ICD-10-CM | POA: Diagnosis not present

## 2017-09-28 NOTE — Telephone Encounter (Signed)
Confirmed remote transmission w/ pt husband.   

## 2017-10-02 ENCOUNTER — Telehealth: Payer: Self-pay | Admitting: Gastroenterology

## 2017-10-02 NOTE — Progress Notes (Signed)
Remote pacemaker transmission.   

## 2017-10-02 NOTE — Telephone Encounter (Signed)
Spoke with pt. She is rescheduled to 12/11/17 at 8:30am. Aware new instructions have been mailed. Called Caarolyn and LMOVM advising to change to appt date/time.

## 2017-10-02 NOTE — Telephone Encounter (Signed)
Pt is scheduled with SF on 6/10 and needs to reschedule. She prefers a Monday or Tuesday. Please call her at [phone]

## 2017-10-03 ENCOUNTER — Encounter: Payer: Self-pay | Admitting: Cardiology

## 2017-10-12 LAB — CUP PACEART REMOTE DEVICE CHECK
Battery Voltage: 2.71 V
Brady Statistic AP VS Percent: 64 %
Brady Statistic AS VP Percent: 0 %
Brady Statistic AS VS Percent: 36 %
Date Time Interrogation Session: 20190503011115
Implantable Lead Implant Date: 19970416
Implantable Lead Location: 753859
Implantable Lead Model: 5034
Implantable Lead Model: 5534
Implantable Pulse Generator Implant Date: 20080421
Lead Channel Pacing Threshold Amplitude: 0.625 V
Lead Channel Pacing Threshold Amplitude: 0.75 V
Lead Channel Pacing Threshold Pulse Width: 0.4 ms
Lead Channel Pacing Threshold Pulse Width: 0.4 ms
Lead Channel Setting Pacing Pulse Width: 0.4 ms
MDC IDC LEAD IMPLANT DT: 19970416
MDC IDC LEAD LOCATION: 753860
MDC IDC MSMT BATTERY IMPEDANCE: 2182 Ohm
MDC IDC MSMT BATTERY REMAINING LONGEVITY: 25 mo
MDC IDC MSMT LEADCHNL RA IMPEDANCE VALUE: 826 Ohm
MDC IDC MSMT LEADCHNL RV IMPEDANCE VALUE: 918 Ohm
MDC IDC SET LEADCHNL RA PACING AMPLITUDE: 2 V
MDC IDC SET LEADCHNL RV PACING AMPLITUDE: 2.5 V
MDC IDC SET LEADCHNL RV SENSING SENSITIVITY: 5.6 mV
MDC IDC STAT BRADY AP VP PERCENT: 0 %

## 2017-12-07 ENCOUNTER — Telehealth: Payer: Self-pay | Admitting: Gastroenterology

## 2017-12-07 ENCOUNTER — Telehealth: Payer: Self-pay

## 2017-12-07 MED ORDER — CLENPIQ 10-3.5-12 MG-GM -GM/160ML PO SOLN: 1.0000 | Freq: Once | ORAL | 0 refills | Status: AC

## 2017-12-07 NOTE — Telephone Encounter (Signed)
Pt is scheduled with SF on 7/15 and needs her prep prescription called into [facility] in [place]

## 2017-12-07 NOTE — Telephone Encounter (Signed)
Mrs. Sylvia Anthony would like to speak with someone regarding a statement for dates of service of 06/29/2017 & 09/28/2017 . Please call [phone].

## 2017-12-07 NOTE — Telephone Encounter (Signed)
Rx for prep sent to pharmacy. Tried to call pt, no answer, LMOAM and LMOVM.

## 2017-12-11 ENCOUNTER — Ambulatory Visit (HOSPITAL_COMMUNITY)
Admission: RE | Admit: 2017-12-11 | Discharge: 2017-12-11 | Disposition: A | Discharge status: Home / Self Care | Payer: Medicare Other | Source: Ambulatory Visit | Attending: Gastroenterology | Admitting: Gastroenterology

## 2017-12-11 ENCOUNTER — Encounter (HOSPITAL_COMMUNITY)
Admission: RE | Disposition: A | Discharge status: Home / Self Care | Payer: Self-pay | Source: Ambulatory Visit | Attending: Gastroenterology

## 2017-12-11 ENCOUNTER — Other Ambulatory Visit: Payer: Self-pay

## 2017-12-11 ENCOUNTER — Encounter (HOSPITAL_COMMUNITY): Payer: Self-pay | Admitting: *Deleted

## 2017-12-11 DIAGNOSIS — Z7982 Long term (current) use of aspirin: Secondary | ICD-10-CM | POA: Insufficient documentation

## 2017-12-11 DIAGNOSIS — Z950 Presence of cardiac pacemaker: Secondary | ICD-10-CM | POA: Diagnosis not present

## 2017-12-11 DIAGNOSIS — K219 Gastro-esophageal reflux disease without esophagitis: Secondary | ICD-10-CM | POA: Diagnosis not present

## 2017-12-11 DIAGNOSIS — K644 Residual hemorrhoidal skin tags: Secondary | ICD-10-CM | POA: Diagnosis not present

## 2017-12-11 DIAGNOSIS — K5730 Diverticulosis of large intestine without perforation or abscess without bleeding: Secondary | ICD-10-CM | POA: Insufficient documentation

## 2017-12-11 DIAGNOSIS — E039 Hypothyroidism, unspecified: Secondary | ICD-10-CM | POA: Diagnosis not present

## 2017-12-11 DIAGNOSIS — I739 Peripheral vascular disease, unspecified: Secondary | ICD-10-CM | POA: Diagnosis not present

## 2017-12-11 DIAGNOSIS — Z86010 Personal history of colonic polyps: Secondary | ICD-10-CM | POA: Diagnosis not present

## 2017-12-11 DIAGNOSIS — Z79890 Hormone replacement therapy (postmenopausal): Secondary | ICD-10-CM | POA: Insufficient documentation

## 2017-12-11 DIAGNOSIS — Z87891 Personal history of nicotine dependence: Secondary | ICD-10-CM | POA: Insufficient documentation

## 2017-12-11 DIAGNOSIS — Q439 Congenital malformation of intestine, unspecified: Secondary | ICD-10-CM | POA: Insufficient documentation

## 2017-12-11 DIAGNOSIS — I110 Hypertensive heart disease with heart failure: Secondary | ICD-10-CM | POA: Insufficient documentation

## 2017-12-11 DIAGNOSIS — Z79899 Other long term (current) drug therapy: Secondary | ICD-10-CM | POA: Insufficient documentation

## 2017-12-11 DIAGNOSIS — Z1211 Encounter for screening for malignant neoplasm of colon: Secondary | ICD-10-CM | POA: Insufficient documentation

## 2017-12-11 DIAGNOSIS — I509 Heart failure, unspecified: Secondary | ICD-10-CM | POA: Insufficient documentation

## 2017-12-11 HISTORY — PX: COLONOSCOPY: SHX5424

## 2017-12-11 SURGERY — COLONOSCOPY
Anesthesia: Moderate Sedation

## 2017-12-11 MED ORDER — MIDAZOLAM HCL 5 MG/5ML IJ SOLN
INTRAMUSCULAR | Status: DC | PRN
  Administered 2017-12-11: 1 mg via INTRAVENOUS
  Administered 2017-12-11: 2 mg via INTRAVENOUS
  Administered 2017-12-11: 1 mg via INTRAVENOUS

## 2017-12-11 MED ORDER — MIDAZOLAM HCL 5 MG/5ML IJ SOLN
INTRAMUSCULAR | Status: AC
  Filled 2017-12-11: qty 10

## 2017-12-11 MED ORDER — SODIUM CHLORIDE 0.9 % IV SOLN
INTRAVENOUS | Status: DC
  Administered 2017-12-11: 08:00:00 via INTRAVENOUS

## 2017-12-11 MED ORDER — MEPERIDINE HCL 100 MG/ML IJ SOLN
INTRAMUSCULAR | Status: AC
  Filled 2017-12-11: qty 2

## 2017-12-11 MED ORDER — MEPERIDINE HCL 100 MG/ML IJ SOLN
INTRAMUSCULAR | Status: DC | PRN
  Administered 2017-12-11 (×2): 25 mg via INTRAVENOUS

## 2017-12-11 NOTE — Discharge Instructions (Signed)
You have small internal hemorrhoids, MODERATE EXTERNAL HEMORRHOIDS, and diverticulosis IN YOUR LEFT COLON. YOU DID NOT HAVE ANY POLYPS.    DRINK WATER TO KEEP YOUR URINE LIGHT YELLOW.  CONTINUE YOUR WEIGHT LOSS EFFORTS. YOUR BODY MASS INDEX IS OVER 30 WHICH MEANS YOU ARE OBESE. OBESITY IS ASSOCIATED WITH AN INCREASED FOR CIRRHOSIS AND ALL CANCERS, INCLUDING ESOPHAGEAL AND COLON CANCER. A WEIGHT OF 178 LBS OR LESS  WILL GET YOUR BODY MASS INDEX(BMI) UNDER 30.   FOLLOW A HIGH FIBER DIET. AVOID ITEMS THAT CAUSE BLOATING & GAS. SEE INFO BELOW. CONSIDER A TRANSITION TO A PLANT BASED DIET-NO MEAT OR DAIRY. AVOID ITEMS THAT CAUSE BLOATING & GAS. I RECOMMEND THE BOOK, "PREVENT AND REVERSE HEART DISEASE". [name], MD. PAGE 120-121 CLEARLY STATE THE RULES AND QUICK AND EASY RECIPES FOR BREAKFAST, LUNCH, AND DINNER ARE AFTER P 127.  Next colonoscopy in 2024. YOUR SISTERS, BROTHERS, CHILDREN, AND PARENTS NEED TO HAVE A COLONOSCOPY STARTING AT THE AGE OF 42.    Colonoscopy Care After Read the instructions outlined below and refer to this sheet in the next week. These discharge instructions provide you with general information on caring for yourself after you leave the hospital. While your treatment has been planned according to the most current medical practices available, unavoidable complications occasionally occur. If you have any problems or questions after discharge, call DR. Durwood Anthony, [phone].  ACTIVITY  You may resume your regular activity, but move at a slower pace for the next 24 hours.   Take frequent rest periods for the next 24 hours.   Walking will help get rid of the air and reduce the bloated feeling in your belly (abdomen).   No driving for 24 hours (because of the medicine (anesthesia) used during the test).   You may shower.   Do not sign any important legal documents or operate any machinery for 24 hours (because of the anesthesia used during the test).     NUTRITION  Drink plenty of fluids.   You may resume your normal diet as instructed by your doctor.   Begin with a light meal and progress to your normal diet. Heavy or fried foods are harder to digest and may make you feel sick to your stomach (nauseated).   Avoid alcoholic beverages for 24 hours or as instructed.    MEDICATIONS  You may resume your normal medications.   WHAT YOU CAN EXPECT TODAY  Some feelings of bloating in the abdomen.   Passage of more gas than usual.   Spotting of blood in your stool or on the toilet paper  .  IF YOU HAD POLYPS REMOVED DURING THE COLONOSCOPY:  Eat a soft diet IF YOU HAVE NAUSEA, BLOATING, ABDOMINAL PAIN, OR VOMITING.    FINDING OUT THE RESULTS OF YOUR TEST Not all test results are available during your visit. DR. Darrick Anthony WILL CALL YOU WITHIN 7 DAYS OF YOUR PROCEDUE WITH YOUR RESULTS. Do not assume everything is normal if you have not heard from DR. Somara Anthony IN ONE WEEK, CALL HER OFFICE AT [phone].  SEEK IMMEDIATE MEDICAL ATTENTION AND CALL THE OFFICE: [phone] IF:  You have more than a spotting of blood in your stool.   Your belly is swollen (abdominal distention).   You are nauseated or vomiting.   You have a temperature over 101F.   You have abdominal pain or discomfort that is severe or gets worse throughout the day.  High-Fiber Diet A high-fiber diet changes your normal diet to  include more whole grains, legumes, fruits, and vegetables. Changes in the diet involve replacing refined carbohydrates with unrefined foods. The calorie level of the diet is essentially unchanged. The Dietary Reference Intake (recommended amount) for adult males is 38 grams per day. For adult females, it is 25 grams per day. Pregnant and lactating women should consume 28 grams of fiber per day. Fiber is the intact part of a plant that is not broken down during digestion. Functional fiber is fiber that has been isolated from the plant to  provide a beneficial effect in the body. PURPOSE  Increase stool bulk.   Ease and regulate bowel movements.   Lower cholesterol.   REDUCE RISK OF COLON CANCER  INDICATIONS THAT YOU NEED MORE FIBER  Constipation and hemorrhoids.   Uncomplicated diverticulosis (intestine condition) and irritable bowel syndrome.   Weight management.   As a protective measure against hardening of the arteries (atherosclerosis), diabetes, and cancer.   GUIDELINES FOR INCREASING FIBER IN THE DIET  Start adding fiber to the diet slowly. A gradual increase of about 5 more grams (2 slices of whole-wheat bread, 2 servings of most fruits or vegetables, or 1 bowl of high-fiber cereal) per day is best. Too rapid an increase in fiber may result in constipation, flatulence, and bloating.   Drink enough water and fluids to keep your urine clear or pale yellow. Water, juice, or caffeine-free drinks are recommended. Not drinking enough fluid may cause constipation.   Eat a variety of high-fiber foods rather than one type of fiber.   Try to increase your intake of fiber through using high-fiber foods rather than fiber pills or supplements that contain small amounts of fiber.   The goal is to change the types of food eaten. Do not supplement your present diet with high-fiber foods, but replace foods in your present diet.   INCLUDE A VARIETY OF FIBER SOURCES  Replace refined and processed grains with whole grains, canned fruits with fresh fruits, and incorporate other fiber sources. White rice, white breads, and most bakery goods contain little or no fiber.   Brown whole-grain rice, buckwheat oats, and many fruits and vegetables are all good sources of fiber. These include: broccoli, Brussels sprouts, cabbage, cauliflower, beets, sweet potatoes, white potatoes (skin on), carrots, tomatoes, eggplant, squash, berries, fresh fruits, and dried fruits.   Cereals appear to be the richest source of fiber. Cereal fiber is  found in whole grains and bran. Bran is the fiber-rich outer coat of cereal grain, which is largely removed in refining. In whole-grain cereals, the bran remains. In breakfast cereals, the largest amount of fiber is found in those with "bran" in their names. The fiber content is sometimes indicated on the label.   You may need to include additional fruits and vegetables each day.   In baking, for 1 cup white flour, you may use the following substitutions:   1 cup whole-wheat flour minus 2 tablespoons.   1/2 cup white flour plus 1/2 cup whole-wheat flour.   Diverticulosis Diverticulosis is a common condition that develops when small pouches (diverticula) form in the wall of the colon. The risk of diverticulosis increases with age. It happens more often in people who eat a low-fiber diet. Most individuals with diverticulosis have no symptoms. Those individuals with symptoms usually experience belly (abdominal) pain, constipation, or loose stools (diarrhea).  HOME CARE INSTRUCTIONS  Increase the amount of fiber in your diet as directed by your caregiver or dietician. This may reduce symptoms of  diverticulosis.   Drink at least 6 to 8 glasses of water each day to prevent constipation.   Try not to strain when you have a bowel movement.   Avoiding nuts and seeds to prevent complications is NOT NECESSARY.   FOODS HAVING HIGH FIBER CONTENT INCLUDE:  Fruits. Apple, peach, pear, tangerine, raisins, prunes.   Vegetables. Brussels sprouts, asparagus, broccoli, cabbage, carrot, cauliflower, romaine lettuce, spinach, summer squash, tomato, winter squash, zucchini.   Starchy Vegetables. Baked beans, kidney beans, lima beans, split peas, lentils, potatoes (with skin).   Grains. Whole wheat bread, brown rice, bran flake cereal, plain oatmeal, white rice, shredded wheat, bran muffins.    SEEK IMMEDIATE MEDICAL CARE IF:  You develop increasing pain or severe bloating.   You have an oral  temperature above 101F.   You develop vomiting or bowel movements that are bloody or black.   Hemorrhoids Hemorrhoids are dilated (enlarged) veins around the rectum. Sometimes clots will form in the veins. This makes them swollen and painful. These are called thrombosed hemorrhoids.  Causes of hemorrhoids include:  Constipation.   Straining to have a bowel movement.   HEAVY LIFTING  HOME CARE INSTRUCTIONS  Eat a well balanced diet and drink 6 to 8 glasses of water every day to avoid constipation. You may also use a bulk laxative.   Avoid straining to have bowel movements.   Keep anal area dry and clean.   Do not use a donut shaped pillow or sit on the toilet for long periods. This increases blood pooling and pain.   Move your bowels when your body has the urge; this will require less straining and will decrease pain and pressure.

## 2017-12-11 NOTE — H&P (Signed)
Primary Care Physician:  Ignatius SpeckingVyas, Dhruv B, MD Primary Gastroenterologist:  Dr. Darrick PennaFields  Pre-Procedure History & Physical: HPI:  Sylvia [name] here for  PERSONAL HISTORY OF POLYPS.  Past Medical History:  Diagnosis Date  . Anemia   . Arm numbness   . Arterial fibromuscular dysplasia (HCC)   . Carotid bruit   . CHF (congestive heart failure) (HCC) 1997   pacemaker  . Fibromyalgia   . GERD (gastroesophageal reflux disease)   . Glucose intolerance (impaired glucose tolerance)   . H. pylori infection Sept 2013   Amoxicillin, Biaxin  . Hyperlipidemia   . Hypertension   . Hypothyroidism   . Mitral valve prolapse   . Peripheral vascular disease (HCC)   . Rheumatic fever    age 89, [facility]  . SSS (sick sinus syndrome) [facility](HCC)     Past Surgical History:  Procedure Laterality Date  . ABDOMINAL HYSTERECTOMY     partial  . APPENDECTOMY    . CARDIAC CATHETERIZATION     multiple  . CHOLECYSTECTOMY    . COLONOSCOPY  08/01/2006   3 mm descending colon polyp removed/8 mm sessile ascending colon polyp removed / 3-mm rectal  polyp removed /Rare sigmoid diverticulosis/ Moderate internal hemorrhoids.Advanced adenoma on colonoscopy in March 2008.  The polyp was anadenomatous polyp with a foci of high-grade dysplasia  . COLONOSCOPY  09/08/2008   SIMPLE ADENOMA/HYPERPLASTIC POLY/Multiple colon polyps (ascending, sigmoid, rectal)  Mild sigmoid colon diverticulosis./ Small internal hemorrhoids  . COLONOSCOPY WITH ESOPHAGOGASTRODUODENOSCOPY (EGD)  Sept. 30, 2013   ZOX:WRUESLF:Mild diverticulosis was noted in the sigmoid colon/The colon was otherwise normal/Small internal hemorrhoids/EGD:The mucosa of the esophagus appeared normal/Non-erosive gastritis (inflammation) was found; multiple bx/The duodenal mucosa showed no abnormalities. +H.pylori gastritis, treated with equivalent of prevpac. SAVARY DILATION  . ESOPHAGEAL DILATION N/A 10/07/2014   Procedure:  ESOPHAGEAL DILATION;  Surgeon: West BaliSandi L Adetokunbo Mccadden, MD;  [facility];  Service: Endoscopy;  Laterality: N/A;  . ESOPHAGOGASTRODUODENOSCOPY N/A 10/07/2014   Dr. Darrick PennaFields: moderate non-erosive gastritis, no definite stricture, empiric dilation . negative H.pylori   . growth removed from intestine     [facility]-as teenager, done through colonoscopy  . PACEMAKER GENERATOR CHANGE  09/18/2006   generator change by Dr Amil AmenEdmunds with a MDT Adapta L  . PACEMAKER INSERTION  09/13/1995   [facility]  . right Achilles tendon     X 2  . TONSILLECTOMY      Prior to Admission medications   Medication Sig Start Date End Date Taking? Authorizing Provider  albuterol (PROVENTIL HFA;VENTOLIN HFA) 108 (90 BASE) MCG/ACT inhaler Inhale 2 puffs into the lungs every 6 (six) hours as needed for wheezing or shortness of breath.   Yes [provider]  aspirin EC 81 MG tablet Take 1 tablet (81 mg total) by mouth daily. 12/23/16  Yes Allred, Fayrene FearingJames, MD  cetirizine (ZYRTEC) 10 MG tablet Take 10 mg by mouth at bedtime as needed for allergies.    Yes [provider]  diclofenac (VOLTAREN) 75 MG EC tablet Take 75 mg by mouth daily as needed for mild pain.   Yes [provider]  levothyroxine (SYNTHROID, LEVOTHROID) 100 MCG tablet Take 100 mcg by mouth daily.     Yes [provider]  pantoprazole (PROTONIX) 40 MG tablet Take 1 tablet (40 mg total) by mouth 2 (two) times daily before a meal. 09/03/14  Yes  Tiffany Kocher, PA-C  potassium chloride SA (K-DUR,KLOR-CON) 20 MEQ tablet Take 20 mEq by mouth daily.    Yes [provider]  rosuvastatin (CRESTOR) 20 MG tablet Take 20 mg by mouth daily. 08/12/16  Yes [provider]  valsartan-hydrochlorothiazide (DIOVAN-HCT) 320-25 MG per tablet Take 1 tablet by mouth daily.     Yes [provider]  HYDROcodone-acetaminophen (NORCO/VICODIN) 5-325 MG tablet Take 1 tablet by mouth every 6  (six) hours as needed for pain.     [provider]  nitroGLYCERIN (NITROSTAT) 0.4 MG SL tablet Place 1 tablet (0.4 mg total) under the tongue every 5 (five) minutes as needed for chest pain. 08/15/16   Lyn Records, MD  traMADol (ULTRAM) 50 MG tablet Take 50 mg by mouth every 4 (four) hours as needed (migraines).    [provider]    Allergies as of 09/18/2017 - Review Complete 09/18/2017  Allergen Reaction Noted  . Gabapentin  12/23/2016    Family History  Problem Relation Age of Onset  . Hyperlipidemia Mother   . Hypertension Mother   . Heart disease Father   . Hypertension Father   . Hyperlipidemia Sister   . Hypertension Sister   . Colon polyps Sister   . Hyperlipidemia Brother   . Hypertension Brother   . Colon polyps Brother   . Colon cancer Neg Hx     Social History   Socioeconomic History  . Marital status: Married    Spouse name: Not on file  . Number of children: 1  . Years of education: Not on file  . Highest education level: Not on file  Occupational History  . Occupation: part-time hairdresser  Social Needs  . Financial resource strain: Not on file  . Food insecurity:    Worry: Not on file    Inability: Not on file  . Transportation needs:    Medical: Not on file    Non-medical: Not on file  Tobacco Use  . Smoking status: Former Smoker    Packs/day: 0.25    Years: 4.00    Pack years: 1.00    Last attempt to quit: 10/09/1985    Years since quitting: 32.1  . Smokeless tobacco: Never Used  . Tobacco comment: just in teens  Substance and Sexual Activity  . Alcohol use: No    Alcohol/week: 0.0 oz  . Drug use: No  . Sexual activity: Not on file  Lifestyle  . Physical activity:    Days per week: Not on file    Minutes per session: Not on file  . Stress: Not on file  Relationships  . Social connections:    Talks on phone: Not on file    Gets together: Not on file    Attends religious service: Not on file    Active member of  club or organization: Not on file    Attends meetings of clubs or organizations: Not on file    Relationship status: Not on file  . Intimate partner violence:    Fear of current or ex partner: Not on file    Emotionally abused: Not on file    Physically abused: Not on file    Forced sexual activity: Not on file  Other Topics Concern  . Not on file  Social History Narrative   Lives in [place] with spouse.  Son is healthy at age 81.   Disabled          Review of Systems: See HPI, otherwise  negative ROS   Physical Exam: BP (!) 157/85   Pulse 60   Temp 98 F (36.7 C) (Oral)   Resp 16   Ht 5' 4.5" (1.638 m)   Wt 203 lb (92.1 kg)   SpO2 97%   BMI 34.31 kg/m  General:   Alert,  pleasant and cooperative in NAD Head:  Normocephalic and atraumatic. Neck:  Supple; Lungs:  Clear throughout to auscultation.    Heart:  Regular rate and rhythm. Abdomen:  Soft, nontender and nondistended. Normal bowel sounds, without guarding, and without rebound.   Neurologic:  Alert and  oriented x4;  grossly normal neurologically.  Impression/Plan:     PERSONAL HISTORY OF POLYPS.  PLAN: 1. TCS TODAY. DISCUSSED PROCEDURE, BENEFITS, & RISKS: < 1% chance of medication reaction, bleeding, perforation, or rupture of spleen/liver.

## 2017-12-11 NOTE — Op Note (Signed)
[facility] Patient Name: Sylvia Anthony Procedure Date: 12/11/2017 8:22 AM MRN: [id number] Date of Birth: 1956-03-02 Attending MD: Jonette Eva MD, MD CSN: [id number] Age: 62 Admit Type: Outpatient Procedure:                Colonoscopy, SURVEILLANCE Indications:              Personal history of colonic polyps-TCS(AGE 41:                            ASCENDING COLON TA(8 mm) W/ FOCI OF HGD IN 2008,                            TCS: ONE SIMPLE ADENOMA REMOVED 2010 Providers:                Jonette Eva MD, MD, Armando Gang, RN, Ina Homes,                            Technician Referring MD:             Ignatius Specking Medicines:                Meperidine 50 mg IV, Midazolam 4 mg IV Complications:            No immediate complications. Estimated Blood Loss:     Estimated blood loss: none. Procedure:                Pre-Anesthesia Assessment:                           - Prior to the procedure, a History and Physical                            was performed, and patient medications and                            allergies were reviewed. The patient's tolerance of                            previous anesthesia was also reviewed. The risks                            and benefits of the procedure and the sedation                            options and risks were discussed with the patient.                            All questions were answered, and informed consent                            was obtained. Prior Anticoagulants: The patient has                            taken aspirin, last dose was 1 day prior to  procedure. ASA Grade Assessment: II - A patient                            with mild systemic disease. After reviewing the                            risks and benefits, the patient was deemed in                            satisfactory condition to undergo the procedure.                            After obtaining informed consent, the colonoscope                       was passed under direct vision. Throughout the                            procedure, the patient's blood pressure, pulse, and                            oxygen saturations were monitored continuously. The                            CF-[id number] ([id number] scope was introduced through                            the anus and advanced to the the cecum, identified                            by appendiceal orifice and ileocecal valve. The                            colonoscopy was somewhat difficult due to                            restricted mobility of the colon. Successful                            completion of the procedure was aided by increasing                            the dose of sedation medication, straightening and                            shortening the scope to obtain bowel loop reduction                            and COLOWRAP. The patient tolerated the procedure                            fairly well. The quality of the bowel preparation  was excellent. The ileocecal valve, appendiceal                            orifice, and rectum were photographed. Scope In: 8:57:23 AM Scope Out: 9:16:29 AM Scope Withdrawal Time: 0 hours 13 minutes 43 seconds  Total Procedure Duration: 0 hours 19 minutes 6 seconds  Findings:      Multiple small and large-mouthed diverticula were found in the       recto-sigmoid colon, sigmoid colon, descending colon and distal       transverse colon.      The recto-sigmoid colon and sigmoid colon were moderately tortuous.      External hemorrhoids were found. The hemorrhoids were moderate. Impression:               - Diverticulosis in the recto-sigmoid colon, in the                            sigmoid colon, in the descending colon and in the                            distal transverse colon.                           - Tortuous colon.                           - External hemorrhoids. Moderate Sedation:       Moderate (conscious) sedation was administered by the endoscopy nurse       and supervised by the endoscopist. The following parameters were       monitored: oxygen saturation, heart rate, blood pressure, and response       to care. Total physician intraservice time was 34 minutes. Recommendation:           - Repeat colonoscopy in 5 years for surveillance.                            ALL FIRST DEGREE RELATIIVES NEED TCS AT AGE 19.                           - High fiber diet. LOSE WEIGHT TO < 178 LBS(BMI <                            30)                           - Continue present medications.                           - Patient has a contact number available for                            emergencies. The signs and symptoms of potential                            delayed complications were discussed with the  patient. Return to normal activities tomorrow.                            Written discharge instructions were provided to the                            patient. Procedure Code(s):        --- Professional ---                           [phone], Colonoscopy, flexible; diagnostic, including                            collection of specimen(s) by brushing or washing,                            when performed (separate procedure)                           G0500, Moderate sedation services provided by the                            same physician or other qualified health care                            professional performing a gastrointestinal                            endoscopic service that sedation supports,                            requiring the presence of an independent trained                            observer to assist in the monitoring of the                            patient's level of consciousness and physiological                            status; initial 15 minutes of intra-service time;                            patient age 35 years or older  (additional time may                            be reported with [id number], as appropriate)                           [phone], Moderate sedation services provided by the                            same physician or other qualified health care  professional performing the diagnostic or                            therapeutic service that the sedation supports,                            requiring the presence of an independent trained                            observer to assist in the monitoring of the                            patient's level of consciousness and physiological                            status; each additional 15 minutes intraservice                            time (List separately in addition to code for                            primary service) Diagnosis Code(s):        --- Professional ---                           K64.4, Residual hemorrhoidal skin tags                           Z86.010, Personal history of colonic polyps                           K57.30, Diverticulosis of large intestine without                            perforation or abscess without bleeding                           Q43.8, Other specified congenital malformations of                            intestine CPT copyright 2017 American Medical Association. All rights reserved. The codes documented in this report are preliminary and upon coder review may  be revised to meet current compliance requirements. Jonette Eva, MD Jonette Eva MD, MD 12/11/2017 9:37:51 AM This report has been signed electronically. Number of Addenda: 0

## 2017-12-15 ENCOUNTER — Encounter (HOSPITAL_COMMUNITY): Payer: Self-pay | Admitting: Gastroenterology

## 2017-12-28 ENCOUNTER — Encounter: Payer: Self-pay | Admitting: Cardiology

## 2017-12-28 ENCOUNTER — Ambulatory Visit (INDEPENDENT_AMBULATORY_CARE_PROVIDER_SITE_OTHER): Payer: Medicare Other | Admitting: *Deleted

## 2017-12-28 DIAGNOSIS — I495 Sick sinus syndrome: Secondary | ICD-10-CM

## 2017-12-28 NOTE — Progress Notes (Signed)
Remote pacemaker transmission.   

## 2017-12-29 ENCOUNTER — Ambulatory Visit (INDEPENDENT_AMBULATORY_CARE_PROVIDER_SITE_OTHER): Payer: Medicare Other | Admitting: Internal Medicine

## 2017-12-29 ENCOUNTER — Encounter: Payer: Self-pay | Admitting: Internal Medicine

## 2017-12-29 VITALS — BP 159/89 | HR 83 | Ht 64.5 in | Wt 202.0 lb

## 2017-12-29 DIAGNOSIS — I495 Sick sinus syndrome: Secondary | ICD-10-CM

## 2017-12-29 DIAGNOSIS — Z950 Presence of cardiac pacemaker: Secondary | ICD-10-CM | POA: Diagnosis not present

## 2017-12-29 NOTE — Progress Notes (Signed)
PCP: Sylvia Anthony, Sylvia B, MD Primary Cardiologist: Dr Sylvia Anthony Primary EP:  Dr Sylvia Anthony  Sylvia GaribaldiLiaretta Anthony [name] who presents today for routine electrophysiology followup.  Since last being seen in our clinic, the patient reports doing very well.  Today, she denies symptoms of palpitations, chest pain, shortness of breath,  lower extremity edema, dizziness, presyncope, or syncope.  The patient is otherwise without complaint today.   Past Medical History:  Diagnosis Date  . Anemia   . Arm numbness   . Arterial fibromuscular dysplasia (HCC)   . Carotid bruit   . CHF (congestive heart failure) (HCC) 1997   pacemaker  . Fibromyalgia   . GERD (gastroesophageal reflux disease)   . Glucose intolerance (impaired glucose tolerance)   . H. pylori infection Sept 2013   Amoxicillin, Biaxin  . Hyperlipidemia   . Hypertension   . Hypothyroidism   . Mitral valve prolapse   . Peripheral vascular disease (HCC)   . Rheumatic fever    age 89, was [facility]  . SSS (sick sinus syndrome) [facility](HCC)    Past Surgical History:  Procedure Laterality Date  . ABDOMINAL HYSTERECTOMY     partial  . APPENDECTOMY    . CARDIAC CATHETERIZATION     multiple  . CHOLECYSTECTOMY    . COLONOSCOPY  08/01/2006   3 mm descending colon polyp removed/8 mm sessile ascending colon polyp removed / 3-mm rectal  polyp removed /Rare sigmoid diverticulosis/ Moderate internal hemorrhoids.Advanced adenoma on colonoscopy in March 2008.  The polyp was anadenomatous polyp with a foci of high-grade dysplasia  . COLONOSCOPY  09/08/2008   SIMPLE ADENOMA/HYPERPLASTIC POLY/Multiple colon polyps (ascending, sigmoid, rectal)  Mild sigmoid colon diverticulosis./ Small internal hemorrhoids  . COLONOSCOPY N/A 12/11/2017   Procedure: COLONOSCOPY;  Surgeon: West BaliFields, Sandi L, MD;  Location: [facility];  Service: Endoscopy;  Laterality: N/A;  8:30am  . COLONOSCOPY WITH ESOPHAGOGASTRODUODENOSCOPY (EGD)  Sept. 30,  2013   ZOX:WRUESLF:Mild diverticulosis was noted in the sigmoid colon/The colon was otherwise normal/Small internal hemorrhoids/EGD:The mucosa of the esophagus appeared normal/Non-erosive gastritis (inflammation) was found; multiple bx/The duodenal mucosa showed no abnormalities. +H.pylori gastritis, treated with equivalent of prevpac. SAVARY DILATION  . ESOPHAGEAL DILATION N/A 10/07/2014   Procedure: ESOPHAGEAL DILATION;  Surgeon: West BaliSandi L Fields, MD;  Location: [facility];  Service: Endoscopy;  Laterality: N/A;  . ESOPHAGOGASTRODUODENOSCOPY N/A 10/07/2014   Dr. Darrick PennaFields: moderate non-erosive gastritis, no definite stricture, empiric dilation . negative H.pylori   . growth removed from intestine     [facility]-as teenager, done through colonoscopy  . PACEMAKER GENERATOR CHANGE  09/18/2006   generator change by Dr Amil AmenEdmunds with a MDT Adapta L  . PACEMAKER INSERTION  09/13/1995   [facility]  . right Achilles tendon     X 2  . TONSILLECTOMY      ROS- all systems are reviewed and negative except as per HPI above  Current Outpatient Medications  Medication Sig Dispense Refill  . albuterol (PROVENTIL HFA;VENTOLIN HFA) 108 (90 BASE) MCG/ACT inhaler Inhale 2 puffs into the lungs every 6 (six) hours as needed for wheezing or shortness of breath.    Marland Kitchen. aspirin EC 81 MG tablet Take 1 tablet (81 mg total) by mouth daily. 90 tablet 3  . cetirizine (ZYRTEC) 10 MG tablet Take 10 mg by mouth at bedtime as needed for allergies.     Marland Kitchen. diclofenac (VOLTAREN)  75 MG EC tablet Take 75 mg by mouth daily as needed for mild pain.    Marland Kitchen HYDROcodone-acetaminophen (NORCO/VICODIN) 5-325 MG tablet Take 1 tablet by mouth every 6 (six) hours as needed for pain.     Marland Kitchen levothyroxine (SYNTHROID, LEVOTHROID) 112 MCG tablet Take 1 tablet by mouth daily.    . nitroGLYCERIN (NITROSTAT) 0.4 MG SL tablet Place 1 tablet (0.4 mg total) under the tongue every 5 (five) minutes as needed for chest pain. 25  tablet 3  . pantoprazole (PROTONIX) 40 MG tablet Take 1 tablet (40 mg total) by mouth 2 (two) times daily before a meal. 60 tablet 5  . potassium chloride SA (K-DUR,KLOR-CON) 20 MEQ tablet Take 20 mEq by mouth daily.     . rosuvastatin (CRESTOR) 20 MG tablet Take 20 mg by mouth daily.  6  . traMADol (ULTRAM) 50 MG tablet Take 50 mg by mouth every 4 (four) hours as needed (migraines).    . valsartan-hydrochlorothiazide (DIOVAN-HCT) 320-25 MG per tablet Take 1 tablet by mouth daily.       No current facility-administered medications for this visit.     Physical Exam: Vitals:   12/29/17 0942  BP: (!) 159/89  Pulse: 83  SpO2: 98%  Weight: 202 lb (91.6 kg)  Height: 5' 4.5" (1.638 m)    GEN- The patient is well appearing, alert and oriented x 3 today.   Head- normocephalic, atraumatic Eyes-  Sclera clear, conjunctiva pink Ears- hearing intact Oropharynx- clear Lungs- Clear to ausculation bilaterally, normal work of breathing Chest- pacemaker pocket is well healed Heart- Regular rate and rhythm, no murmurs, rubs or gallops, PMI not laterally displaced GI- soft, NT, ND, + BS Extremities- no clubbing, cyanosis, or edema  Pacemaker interrogation- reviewed in detail today,  See PACEART report   Assessment and Plan:  1. Symptomatic sinus bradycardia  Normal pacemaker function See Pace Art report No changes today  2. HTN Elevated She does not wish to make changes today  3. ? Prior stroke On ASA per Dr Dan Europe Return in a year  Hillis Range MD, [facility] 12/29/2017 10:07 AM

## 2017-12-29 NOTE — Patient Instructions (Signed)
Medication Instructions:  Continue all current medications.  Labwork: none  Testing/Procedures: none  Follow-Up: 1 year   Any Other Special Instructions Will Be Listed Below (If Applicable). Remote monitoring is used to monitor your Pacemaker of ICD from home. This monitoring reduces the number of office visits required to check your device to one time per year. It allows us to keep an eye on the functioning of your device to ensure it is working properly. You are scheduled for a device check from home on 03/29/2018. You may send your transmission at any time that day. If you have a wireless device, the transmission will be sent automatically. After your physician reviews your transmission, you will receive a postcard with your next transmission date.  If you need a refill on your cardiac medications before your next appointment, please call your pharmacy.

## 2018-01-21 LAB — CUP PACEART INCLINIC DEVICE CHECK
Battery Voltage: 2.69 V
Brady Statistic AP VP Percent: 0 %
Brady Statistic AP VS Percent: 61 %
Brady Statistic AS VS Percent: 39 %
Implantable Lead Implant Date: 19970416
Lead Channel Impedance Value: 880 Ohm
Lead Channel Impedance Value: 934 Ohm
Lead Channel Pacing Threshold Amplitude: 0.75 V
Lead Channel Pacing Threshold Pulse Width: 0.4 ms
Lead Channel Sensing Intrinsic Amplitude: 22.4 mV
Lead Channel Setting Pacing Amplitude: 2 V
Lead Channel Setting Sensing Sensitivity: 5.6 mV
MDC IDC LEAD IMPLANT DT: 19970416
MDC IDC LEAD LOCATION: 753859
MDC IDC LEAD LOCATION: 753860
MDC IDC MSMT BATTERY IMPEDANCE: 2654 Ohm
MDC IDC MSMT BATTERY REMAINING LONGEVITY: 21 mo
MDC IDC MSMT LEADCHNL RA PACING THRESHOLD AMPLITUDE: 0.75 V
MDC IDC MSMT LEADCHNL RA PACING THRESHOLD PULSEWIDTH: 0.4 ms
MDC IDC MSMT LEADCHNL RA SENSING INTR AMPL: 2 mV
MDC IDC PG IMPLANT DT: 20080421
MDC IDC SESS DTM: 20190802141843
MDC IDC SET LEADCHNL RV PACING AMPLITUDE: 2.5 V
MDC IDC SET LEADCHNL RV PACING PULSEWIDTH: 0.4 ms
MDC IDC STAT BRADY AS VP PERCENT: 0 %

## 2018-01-25 LAB — CUP PACEART REMOTE DEVICE CHECK
Battery Impedance: 2520 Ohm
Brady Statistic AP VP Percent: 0 %
Brady Statistic AP VS Percent: 61 %
Brady Statistic AS VS Percent: 39 %
Date Time Interrogation Session: 20190801113519
Implantable Lead Implant Date: 19970416
Implantable Lead Implant Date: 19970416
Implantable Lead Location: 753859
Implantable Lead Model: 5034
Lead Channel Impedance Value: 928 Ohm
Lead Channel Pacing Threshold Amplitude: 0.625 V
Lead Channel Pacing Threshold Amplitude: 0.875 V
Lead Channel Pacing Threshold Pulse Width: 0.4 ms
Lead Channel Sensing Intrinsic Amplitude: 16 mV
Lead Channel Sensing Intrinsic Amplitude: 2.8 mV
Lead Channel Setting Pacing Amplitude: 2 V
Lead Channel Setting Sensing Sensitivity: 5.6 mV
MDC IDC LEAD LOCATION: 753860
MDC IDC MSMT BATTERY REMAINING LONGEVITY: 22 mo
MDC IDC MSMT BATTERY VOLTAGE: 2.69 V
MDC IDC MSMT LEADCHNL RA IMPEDANCE VALUE: 869 Ohm
MDC IDC MSMT LEADCHNL RA PACING THRESHOLD PULSEWIDTH: 0.4 ms
MDC IDC PG IMPLANT DT: 20080421
MDC IDC SET LEADCHNL RV PACING AMPLITUDE: 2.5 V
MDC IDC SET LEADCHNL RV PACING PULSEWIDTH: 0.4 ms
MDC IDC STAT BRADY AS VP PERCENT: 0 %

## 2018-03-29 ENCOUNTER — Ambulatory Visit (INDEPENDENT_AMBULATORY_CARE_PROVIDER_SITE_OTHER): Payer: Medicare Other | Admitting: *Deleted

## 2018-03-29 ENCOUNTER — Telehealth: Payer: Self-pay

## 2018-03-29 DIAGNOSIS — I6300 Cerebral infarction due to thrombosis of unspecified precerebral artery: Secondary | ICD-10-CM

## 2018-03-29 DIAGNOSIS — I495 Sick sinus syndrome: Secondary | ICD-10-CM

## 2018-03-29 NOTE — Telephone Encounter (Signed)
Confirmed remote transmission w/ pt husband.   

## 2018-03-30 NOTE — Progress Notes (Signed)
Remote pacemaker transmission.   

## 2018-04-06 ENCOUNTER — Telehealth: Payer: Self-pay | Admitting: Cardiology

## 2018-04-06 ENCOUNTER — Encounter: Payer: Self-pay | Admitting: Cardiology

## 2018-04-06 NOTE — Telephone Encounter (Signed)
Opened in error

## 2018-05-29 LAB — CUP PACEART REMOTE DEVICE CHECK
Battery Impedance: 3942 Ohm
Brady Statistic AP VP Percent: 0 %
Brady Statistic AP VS Percent: 63 %
Brady Statistic AS VP Percent: 0 %
Brady Statistic AS VS Percent: 37 %
Date Time Interrogation Session: 20191101014701
Implantable Lead Implant Date: 19970416
Implantable Lead Location: 753859
Implantable Lead Location: 753860
Lead Channel Impedance Value: 977 Ohm
Lead Channel Pacing Threshold Amplitude: 0.625 V
Lead Channel Pacing Threshold Amplitude: 0.875 V
Lead Channel Pacing Threshold Pulse Width: 0.4 ms
Lead Channel Setting Pacing Amplitude: 2.5 V
MDC IDC LEAD IMPLANT DT: 19970416
MDC IDC MSMT BATTERY REMAINING LONGEVITY: 13 mo
MDC IDC MSMT BATTERY VOLTAGE: 2.65 V
MDC IDC MSMT LEADCHNL RA IMPEDANCE VALUE: 875 Ohm
MDC IDC MSMT LEADCHNL RV PACING THRESHOLD PULSEWIDTH: 0.4 ms
MDC IDC PG IMPLANT DT: 20080421
MDC IDC SET LEADCHNL RA PACING AMPLITUDE: 2 V
MDC IDC SET LEADCHNL RV PACING PULSEWIDTH: 0.4 ms
MDC IDC SET LEADCHNL RV SENSING SENSITIVITY: 5.6 mV

## 2018-06-07 ENCOUNTER — Telehealth: Payer: Self-pay | Admitting: Cardiology

## 2018-06-07 NOTE — Telephone Encounter (Signed)
Spoke with patient. She reports the fatigue is very bothersome to her, and last night she felt a lot of fluttering. She almost went to the ER, but fluttering stopped. No other symptoms. Advised patient of ED precautions overnight, but will plan to see in [place] tomorrow morning at 8:00am. Patient agrees to bring her home monitor with her for troubleshooting. She is appreciative of call and denies questions or concerns a this time.  PPM battery had an estimated 13 months remaining at last remote check on 03/29/18.

## 2018-06-07 NOTE — Telephone Encounter (Signed)
Patient called and stated that she has been feeling tired, sluggish, and fluttering in her chest since christmas. Instructed pt to send a manual transmission w/ her home monitor. Instructed her that once the transmission is received a Armed forces logistics/support/administrative officer will review and call her back. Pt verbalized understanding.

## 2018-06-07 NOTE — Telephone Encounter (Signed)
Patient called and stated that she was getting error code on her home monitor and was unable to send. She requested and agreed to an appt for Friday 06-08-2018 at 8:00 AM at the [facility] office w/ the Device [employer].

## 2018-06-08 ENCOUNTER — Encounter: Payer: Self-pay | Admitting: *Deleted

## 2018-06-08 ENCOUNTER — Telehealth: Payer: Self-pay | Admitting: Internal Medicine

## 2018-06-08 ENCOUNTER — Ambulatory Visit (INDEPENDENT_AMBULATORY_CARE_PROVIDER_SITE_OTHER): Payer: 59 | Admitting: Internal Medicine

## 2018-06-08 ENCOUNTER — Ambulatory Visit (INDEPENDENT_AMBULATORY_CARE_PROVIDER_SITE_OTHER): Payer: Medicare Other | Admitting: *Deleted

## 2018-06-08 ENCOUNTER — Encounter: Payer: Self-pay | Admitting: Internal Medicine

## 2018-06-08 VITALS — BP 132/74 | HR 43 | Ht 64.5 in | Wt 203.6 lb

## 2018-06-08 DIAGNOSIS — Z01812 Encounter for preprocedural laboratory examination: Secondary | ICD-10-CM

## 2018-06-08 DIAGNOSIS — I495 Sick sinus syndrome: Secondary | ICD-10-CM

## 2018-06-08 DIAGNOSIS — Z950 Presence of cardiac pacemaker: Secondary | ICD-10-CM

## 2018-06-08 DIAGNOSIS — I10 Essential (primary) hypertension: Secondary | ICD-10-CM

## 2018-06-08 LAB — CUP PACEART INCLINIC DEVICE CHECK
Battery Impedance: 6010 Ohm
Brady Statistic RV Percent Paced: 0 %
Implantable Lead Implant Date: 19970416
Implantable Lead Location: 753860
Lead Channel Impedance Value: 67 Ohm
Lead Channel Impedance Value: 940 Ohm
Lead Channel Pacing Threshold Pulse Width: 0.4 ms
Lead Channel Setting Sensing Sensitivity: 5.6 mV
MDC IDC LEAD IMPLANT DT: 19970416
MDC IDC LEAD LOCATION: 753859
MDC IDC MSMT BATTERY VOLTAGE: 2.61 V
MDC IDC MSMT LEADCHNL RV PACING THRESHOLD AMPLITUDE: 0.75 V
MDC IDC MSMT LEADCHNL RV SENSING INTR AMPL: 22.4 mV
MDC IDC PG IMPLANT DT: 20080421
MDC IDC SESS DTM: 20200110140134
MDC IDC SET LEADCHNL RV PACING AMPLITUDE: 2.5 V
MDC IDC SET LEADCHNL RV PACING PULSEWIDTH: 0.4 ms

## 2018-06-08 NOTE — Progress Notes (Signed)
PCP: Sylvia Specking, MD Primary Cardiologist: Dr Katrinka Blazing Primary EP:  Dr Johney Frame  Sylvia Anthony is a 63 y.o. female who presents today for add on electrophysiology followup.  She is not feeling well.  Her pacemaker is at [facility] and has converted to backup VVI 65 pacing.  She is quite symptomatic with palpitations and fatigue.   Today, she denies symptoms of chest pain, shortness of breath,  lower extremity edema, dizziness, presyncope, or syncope.  The patient is otherwise without complaint today.   Past Medical History:  Diagnosis Date  . Anemia   . Arm numbness   . Arterial fibromuscular dysplasia (HCC)   . Carotid bruit   . CHF (congestive heart failure) (HCC) 1997   pacemaker  . Fibromyalgia   . GERD (gastroesophageal reflux disease)   . Glucose intolerance (impaired glucose tolerance)   . H. pylori infection Sept 2013   Amoxicillin, Biaxin  . Hyperlipidemia   . Hypertension   . Hypothyroidism   . Mitral valve prolapse   . Peripheral vascular disease (HCC)   . Rheumatic fever    age 65, was at [facility] for 17 weeks  . SSS (sick sinus syndrome) [facility])    Past Surgical History:  Procedure Laterality Date  . ABDOMINAL HYSTERECTOMY     partial  . APPENDECTOMY    . CARDIAC CATHETERIZATION     multiple  . CHOLECYSTECTOMY    . COLONOSCOPY  08/01/2006   3 mm descending colon polyp removed/8 mm sessile ascending colon polyp removed / 3-mm rectal  polyp removed /Rare sigmoid diverticulosis/ Moderate internal hemorrhoids.Advanced adenoma on colonoscopy in March 2008.  The polyp was anadenomatous polyp with a foci of high-grade dysplasia  . COLONOSCOPY  09/08/2008   SIMPLE ADENOMA/HYPERPLASTIC POLY/Multiple colon polyps (ascending, sigmoid, rectal)  Mild sigmoid colon diverticulosis./ Small internal hemorrhoids  . COLONOSCOPY N/A 12/11/2017   Procedure: COLONOSCOPY;  Surgeon: West Bali, MD;  [facility];  Service: Endoscopy;  Laterality: N/A;  8:30am  .  COLONOSCOPY WITH ESOPHAGOGASTRODUODENOSCOPY (EGD)  Sept. 30, 2013   VVO:HYWV diverticulosis was noted in the sigmoid colon/The colon was otherwise normal/Small internal hemorrhoids/EGD:The mucosa of the esophagus appeared normal/Non-erosive gastritis (inflammation) was found; multiple bx/The duodenal mucosa showed no abnormalities. +H.pylori gastritis, treated with equivalent of prevpac. SAVARY DILATION  . ESOPHAGEAL DILATION N/A 10/07/2014   Procedure: ESOPHAGEAL DILATION;  Surgeon: West Bali, MD;  [facility];  Service: Endoscopy;  Laterality: N/A;  . ESOPHAGOGASTRODUODENOSCOPY N/A 10/07/2014   Dr. [name]: moderate non-erosive gastritis, no definite stricture, empiric dilation . negative H.pylori   . growth removed from intestine     [facility]-as teenager, done through colonoscopy  . PACEMAKER GENERATOR CHANGE  09/18/2006   generator change by Dr Amil Amen with a MDT Adapta L  . PACEMAKER INSERTION  09/13/1995   for sick sinus syndome and syncope at [facility]  . right Achilles tendon     X 2  . TONSILLECTOMY      ROS- all systems are reviewed and negative except as per HPI above  Current Outpatient Medications  Medication Sig Dispense Refill  . albuterol (PROVENTIL HFA;VENTOLIN HFA) 108 (90 BASE) MCG/ACT inhaler Inhale 2 puffs into the lungs every 6 (six) hours as needed for wheezing or shortness of breath.    Marland Kitchen aspirin EC 81 MG tablet Take 1 tablet (81 mg total) by mouth daily. 90 tablet 3  . cetirizine (ZYRTEC) 10 MG tablet Take 10  mg by mouth at bedtime as needed for allergies.     . diclofenac (VOLTAREN) 75 MG EC tablet Take 75 mg by mouth daily as needed for mild pain.    . HYDROcodone-acetaminophen (NORCO/VICODIN) 5-325 MG tablet Take 1 tablet by mouth every 6 (six) hours as needed for pain.     . levothyroxine (SYNTHROID, LEVOTHROID) 112 MCG tablet Take 1 tablet by mouth daily.    . nitroGLYCERIN (NITROSTAT) 0.4 MG SL tablet Place 1 tablet (0.4 mg total) under  the tongue every 5 (five) minutes as needed for chest pain. 25 tablet 3  . pantoprazole (PROTONIX) 40 MG tablet Take 1 tablet (40 mg total) by mouth 2 (two) times daily before a meal. 60 tablet 5  . potassium chloride SA (K-DUR,KLOR-CON) 20 MEQ tablet Take 20 mEq by mouth daily.     . rosuvastatin (CRESTOR) 20 MG tablet Take 20 mg by mouth daily.  6  . traMADol (ULTRAM) 50 MG tablet Take 50 mg by mouth every 4 (four) hours as needed (migraines).    . valsartan-hydrochlorothiazide (DIOVAN-HCT) 320-25 MG per tablet Take 1 tablet by mouth daily.       No current facility-administered medications for this visit.     Physical Exam: Vitals:   06/08/18 0843  BP: 132/74  Pulse: (!) 43  SpO2: 96%  Weight: 203 lb 9.6 oz (92.4 kg)  Height: 5' 4.5" (1.638 m)    GEN- The patient is well appearing, alert and oriented x 3 today.   Head- normocephalic, atraumatic Eyes-  Sclera clear, conjunctiva pink Ears- hearing intact Oropharynx- clear Lungs- Clear to ausculation bilaterally, normal work of breathing Chest- pacemaker pocket is well healed Heart- Regular rate and rhythm, no murmurs, rubs or gallops, PMI not laterally displaced GI- soft, NT, ND, + BS Extremities- no clubbing, cyanosis, or edema  Pacemaker interrogation- reviewed in detail today,  See PACEART report  ekg tracing ordered today is personally reviewed and shows sinus rhythm 42 bpm, LVH, QRS 90 msec  Assessment and Plan:  1. Symptomatic sinus bradycardia  Pacemaker has reached ERI She is symptomatic and requires urgent pacemaker generator change. Risks, benefits, and alternatives to pacemaker pulse generator replacement were discussed in detail today.  The patient understands that risks include but are not limited to bleeding, infection, pneumothorax, perforation, tamponade, vascular damage, renal failure, MI, stroke, death, damage to his existing leads, and lead dislodgement and wishes to proceed.  We will therefore schedule the  procedure at the next available time.  2. HTN Stable No change required today  [name] MD, FACC 06/08/2018 9:06 AM  

## 2018-06-08 NOTE — H&P (View-Only) (Signed)
PCP: Ignatius Specking, MD Primary Cardiologist: Dr Katrinka Blazing Primary EP:  Dr Johney Frame  Sylvia Anthony is a 63 y.o. female who presents today for add on electrophysiology followup.  She is not feeling well.  Her pacemaker is at [facility] and has converted to backup VVI 65 pacing.  She is quite symptomatic with palpitations and fatigue.   Today, she denies symptoms of chest pain, shortness of breath,  lower extremity edema, dizziness, presyncope, or syncope.  The patient is otherwise without complaint today.   Past Medical History:  Diagnosis Date  . Anemia   . Arm numbness   . Arterial fibromuscular dysplasia (HCC)   . Carotid bruit   . CHF (congestive heart failure) (HCC) 1997   pacemaker  . Fibromyalgia   . GERD (gastroesophageal reflux disease)   . Glucose intolerance (impaired glucose tolerance)   . H. pylori infection Sept 2013   Amoxicillin, Biaxin  . Hyperlipidemia   . Hypertension   . Hypothyroidism   . Mitral valve prolapse   . Peripheral vascular disease (HCC)   . Rheumatic fever    age 65, was at [facility] for 17 weeks  . SSS (sick sinus syndrome) [facility])    Past Surgical History:  Procedure Laterality Date  . ABDOMINAL HYSTERECTOMY     partial  . APPENDECTOMY    . CARDIAC CATHETERIZATION     multiple  . CHOLECYSTECTOMY    . COLONOSCOPY  08/01/2006   3 mm descending colon polyp removed/8 mm sessile ascending colon polyp removed / 3-mm rectal  polyp removed /Rare sigmoid diverticulosis/ Moderate internal hemorrhoids.Advanced adenoma on colonoscopy in March 2008.  The polyp was anadenomatous polyp with a foci of high-grade dysplasia  . COLONOSCOPY  09/08/2008   SIMPLE ADENOMA/HYPERPLASTIC POLY/Multiple colon polyps (ascending, sigmoid, rectal)  Mild sigmoid colon diverticulosis./ Small internal hemorrhoids  . COLONOSCOPY N/A 12/11/2017   Procedure: COLONOSCOPY;  Surgeon: West Bali, MD;  [facility];  Service: Endoscopy;  Laterality: N/A;  8:30am  .  COLONOSCOPY WITH ESOPHAGOGASTRODUODENOSCOPY (EGD)  Sept. 30, 2013   VVO:HYWV diverticulosis was noted in the sigmoid colon/The colon was otherwise normal/Small internal hemorrhoids/EGD:The mucosa of the esophagus appeared normal/Non-erosive gastritis (inflammation) was found; multiple bx/The duodenal mucosa showed no abnormalities. +H.pylori gastritis, treated with equivalent of prevpac. SAVARY DILATION  . ESOPHAGEAL DILATION N/A 10/07/2014   Procedure: ESOPHAGEAL DILATION;  Surgeon: West Bali, MD;  [facility];  Service: Endoscopy;  Laterality: N/A;  . ESOPHAGOGASTRODUODENOSCOPY N/A 10/07/2014   Dr. [name]: moderate non-erosive gastritis, no definite stricture, empiric dilation . negative H.pylori   . growth removed from intestine     [facility]-as teenager, done through colonoscopy  . PACEMAKER GENERATOR CHANGE  09/18/2006   generator change by Dr Amil Amen with a MDT Adapta L  . PACEMAKER INSERTION  09/13/1995   for sick sinus syndome and syncope at [facility]  . right Achilles tendon     X 2  . TONSILLECTOMY      ROS- all systems are reviewed and negative except as per HPI above  Current Outpatient Medications  Medication Sig Dispense Refill  . albuterol (PROVENTIL HFA;VENTOLIN HFA) 108 (90 BASE) MCG/ACT inhaler Inhale 2 puffs into the lungs every 6 (six) hours as needed for wheezing or shortness of breath.    Marland Kitchen aspirin EC 81 MG tablet Take 1 tablet (81 mg total) by mouth daily. 90 tablet 3  . cetirizine (ZYRTEC) 10 MG tablet Take 10  mg by mouth at bedtime as needed for allergies.     Marland Kitchen. diclofenac (VOLTAREN) 75 MG EC tablet Take 75 mg by mouth daily as needed for mild pain.    Marland Kitchen. HYDROcodone-acetaminophen (NORCO/VICODIN) 5-325 MG tablet Take 1 tablet by mouth every 6 (six) hours as needed for pain.     Marland Kitchen. levothyroxine (SYNTHROID, LEVOTHROID) 112 MCG tablet Take 1 tablet by mouth daily.    . nitroGLYCERIN (NITROSTAT) 0.4 MG SL tablet Place 1 tablet (0.4 mg total) under  the tongue every 5 (five) minutes as needed for chest pain. 25 tablet 3  . pantoprazole (PROTONIX) 40 MG tablet Take 1 tablet (40 mg total) by mouth 2 (two) times daily before a meal. 60 tablet 5  . potassium chloride SA (K-DUR,KLOR-CON) 20 MEQ tablet Take 20 mEq by mouth daily.     . rosuvastatin (CRESTOR) 20 MG tablet Take 20 mg by mouth daily.  6  . traMADol (ULTRAM) 50 MG tablet Take 50 mg by mouth every 4 (four) hours as needed (migraines).    . valsartan-hydrochlorothiazide (DIOVAN-HCT) 320-25 MG per tablet Take 1 tablet by mouth daily.       No current facility-administered medications for this visit.     Physical Exam: Vitals:   06/08/18 0843  BP: 132/74  Pulse: (!) 43  SpO2: 96%  Weight: 203 lb 9.6 oz (92.4 kg)  Height: 5' 4.5" (1.638 m)    GEN- The patient is well appearing, alert and oriented x 3 today.   Head- normocephalic, atraumatic Eyes-  Sclera clear, conjunctiva pink Ears- hearing intact Oropharynx- clear Lungs- Clear to ausculation bilaterally, normal work of breathing Chest- pacemaker pocket is well healed Heart- Regular rate and rhythm, no murmurs, rubs or gallops, PMI not laterally displaced GI- soft, NT, ND, + BS Extremities- no clubbing, cyanosis, or edema  Pacemaker interrogation- reviewed in detail today,  See PACEART report  ekg tracing ordered today is personally reviewed and shows sinus rhythm 42 bpm, LVH, QRS 90 msec  Assessment and Plan:  1. Symptomatic sinus bradycardia  Pacemaker has reached ERI She is symptomatic and requires urgent pacemaker generator change. Risks, benefits, and alternatives to pacemaker pulse generator replacement were discussed in detail today.  The patient understands that risks include but are not limited to bleeding, infection, pneumothorax, perforation, tamponade, vascular damage, renal failure, MI, stroke, death, damage to his existing leads, and lead dislodgement and wishes to proceed.  We will therefore schedule the  procedure at the next available time.  2. HTN Stable No change required today  Hillis RangeJames Deshon Koslowski MD, [facility] MexicoFACC 06/08/2018 9:06 AM

## 2018-06-08 NOTE — Progress Notes (Signed)
Patient added-on in Device Clinic due to feeling very fatigued since Christmas, also has noted her heart fluttering. She was unable to get her home monitor to work so that we could review a transmission.  Upon device interrogation in clinic, noted that PPM reached ERI on 06/06/18. Underlying rhythm Vs 40s-50s. RV sensing, threshold, and impedance stable. Reprogrammed single-chamber hysteresis on at 40ppm. Patient added-on to Dr. Jenel Lucks schedule. Plan for PPM generator replacement on 06/11/18.

## 2018-06-08 NOTE — Patient Instructions (Addendum)
Medication Instructions:  Continue all current medications.  Labwork: BMET, CBC - orders given today.   Testing/Procedures: Generator change  Follow-Up: Will be given at time of discharge from procedure.    Any Other Special Instructions Will Be Listed Below (If Applicable).  If you need a refill on your cardiac medications before your next appointment, please call your pharmacy.

## 2018-06-08 NOTE — Telephone Encounter (Signed)
Pre-cert Verification for the following procedure    gen change - Monday, 1/13 - 5:30 - [facility]

## 2018-06-09 LAB — BASIC METABOLIC PANEL
BUN: 9 mg/dL (ref 7–25)
CALCIUM: 9.5 mg/dL (ref 8.6–10.4)
CO2: 32 mmol/L (ref 20–32)
CREATININE: 0.89 mg/dL (ref 0.50–0.99)
Chloride: 103 mmol/L (ref 98–110)
GLUCOSE: 80 mg/dL (ref 65–139)
POTASSIUM: 3.7 mmol/L (ref 3.5–5.3)
SODIUM: 142 mmol/L (ref 135–146)

## 2018-06-09 LAB — CBC
HEMATOCRIT: 44.3 % (ref 35.0–45.0)
HEMOGLOBIN: 14.8 g/dL (ref 11.7–15.5)
MCH: 27.6 pg (ref 27.0–33.0)
MCHC: 33.4 g/dL (ref 32.0–36.0)
MCV: 82.6 fL (ref 80.0–100.0)
MPV: 11.1 fL (ref 7.5–12.5)
Platelets: 293 10*3/uL (ref 140–400)
RBC: 5.36 10*6/uL — AB (ref 3.80–5.10)
RDW: 13.3 % (ref 11.0–15.0)
WBC: 5.9 10*3/uL (ref 3.8–10.8)

## 2018-06-11 ENCOUNTER — Encounter (HOSPITAL_COMMUNITY)
Admission: RE | Disposition: A | Discharge status: Home / Self Care | Payer: Self-pay | Source: Home / Self Care | Attending: Internal Medicine

## 2018-06-11 ENCOUNTER — Encounter (HOSPITAL_COMMUNITY): Payer: Self-pay | Admitting: Internal Medicine

## 2018-06-11 ENCOUNTER — Ambulatory Visit (HOSPITAL_COMMUNITY)
Admission: RE | Admit: 2018-06-11 | Discharge: 2018-06-11 | Disposition: A | Discharge status: Home / Self Care | Payer: 59 | Attending: Internal Medicine | Admitting: Internal Medicine

## 2018-06-11 ENCOUNTER — Other Ambulatory Visit: Payer: Self-pay

## 2018-06-11 DIAGNOSIS — I495 Sick sinus syndrome: Secondary | ICD-10-CM | POA: Insufficient documentation

## 2018-06-11 DIAGNOSIS — I110 Hypertensive heart disease with heart failure: Secondary | ICD-10-CM | POA: Insufficient documentation

## 2018-06-11 DIAGNOSIS — M797 Fibromyalgia: Secondary | ICD-10-CM | POA: Insufficient documentation

## 2018-06-11 DIAGNOSIS — Z7982 Long term (current) use of aspirin: Secondary | ICD-10-CM | POA: Diagnosis not present

## 2018-06-11 DIAGNOSIS — Z79899 Other long term (current) drug therapy: Secondary | ICD-10-CM | POA: Insufficient documentation

## 2018-06-11 DIAGNOSIS — R7302 Impaired glucose tolerance (oral): Secondary | ICD-10-CM | POA: Insufficient documentation

## 2018-06-11 DIAGNOSIS — E039 Hypothyroidism, unspecified: Secondary | ICD-10-CM | POA: Insufficient documentation

## 2018-06-11 DIAGNOSIS — K219 Gastro-esophageal reflux disease without esophagitis: Secondary | ICD-10-CM | POA: Diagnosis not present

## 2018-06-11 DIAGNOSIS — I509 Heart failure, unspecified: Secondary | ICD-10-CM | POA: Diagnosis not present

## 2018-06-11 DIAGNOSIS — Z955 Presence of coronary angioplasty implant and graft: Secondary | ICD-10-CM | POA: Insufficient documentation

## 2018-06-11 DIAGNOSIS — Z79890 Hormone replacement therapy (postmenopausal): Secondary | ICD-10-CM | POA: Diagnosis not present

## 2018-06-11 DIAGNOSIS — Z45010 Encounter for checking and testing of cardiac pacemaker pulse generator [battery]: Secondary | ICD-10-CM

## 2018-06-11 DIAGNOSIS — E785 Hyperlipidemia, unspecified: Secondary | ICD-10-CM | POA: Insufficient documentation

## 2018-06-11 DIAGNOSIS — Z90711 Acquired absence of uterus with remaining cervical stump: Secondary | ICD-10-CM | POA: Insufficient documentation

## 2018-06-11 DIAGNOSIS — I058 Other rheumatic mitral valve diseases: Secondary | ICD-10-CM | POA: Diagnosis not present

## 2018-06-11 HISTORY — PX: PPM GENERATOR CHANGEOUT: EP1233

## 2018-06-11 LAB — SURGICAL PCR SCREEN
MRSA, PCR: NEGATIVE
Staphylococcus aureus: NEGATIVE

## 2018-06-11 SURGERY — PPM GENERATOR CHANGEOUT

## 2018-06-11 MED ORDER — SODIUM CHLORIDE 0.9% FLUSH: 3.0000 mL | Freq: Two times a day (BID) | INTRAVENOUS | Status: DC

## 2018-06-11 MED ORDER — MIDAZOLAM HCL 5 MG/5ML IJ SOLN
INTRAMUSCULAR | Status: DC | PRN
  Administered 2018-06-11 (×4): 1 mg via INTRAVENOUS

## 2018-06-11 MED ORDER — SODIUM CHLORIDE 0.9 % IV SOLN
INTRAVENOUS | Status: AC
  Filled 2018-06-11: qty 2

## 2018-06-11 MED ORDER — SODIUM CHLORIDE 0.9 % IV SOLN
80.0000 mg | INTRAVENOUS | Status: AC
  Administered 2018-06-11: 80 mg

## 2018-06-11 MED ORDER — FENTANYL CITRATE (PF) 100 MCG/2ML IJ SOLN
INTRAMUSCULAR | Status: DC | PRN
  Administered 2018-06-11 (×3): 12.5 ug via INTRAVENOUS

## 2018-06-11 MED ORDER — SODIUM CHLORIDE 0.9 % IV SOLN: 250.0000 mL | INTRAVENOUS | Status: DC | PRN

## 2018-06-11 MED ORDER — LIDOCAINE HCL (PF) 1 % IJ SOLN
INTRAMUSCULAR | Status: DC | PRN
  Administered 2018-06-11: 50 mL

## 2018-06-11 MED ORDER — CEFAZOLIN SODIUM-DEXTROSE 2-4 GM/100ML-% IV SOLN
2.0000 g | INTRAVENOUS | Status: AC
  Administered 2018-06-11: 2 g via INTRAVENOUS

## 2018-06-11 MED ORDER — ACETAMINOPHEN 325 MG PO TABS
325.0000 mg | ORAL_TABLET | ORAL | Status: DC | PRN
  Filled 2018-06-11: qty 2

## 2018-06-11 MED ORDER — MUPIROCIN 2 % EX OINT
1.0000 "application " | TOPICAL_OINTMENT | Freq: Once | CUTANEOUS | Status: AC
  Administered 2018-06-11: 1 via TOPICAL

## 2018-06-11 MED ORDER — SODIUM CHLORIDE 0.9 % IV SOLN
INTRAVENOUS | Status: DC
  Administered 2018-06-11: 06:00:00 via INTRAVENOUS

## 2018-06-11 MED ORDER — LIDOCAINE HCL 1 % IJ SOLN
INTRAMUSCULAR | Status: AC
  Filled 2018-06-11: qty 60

## 2018-06-11 MED ORDER — FENTANYL CITRATE (PF) 100 MCG/2ML IJ SOLN
INTRAMUSCULAR | Status: AC
  Filled 2018-06-11: qty 2

## 2018-06-11 MED ORDER — MIDAZOLAM HCL 5 MG/5ML IJ SOLN
INTRAMUSCULAR | Status: AC
  Filled 2018-06-11: qty 5

## 2018-06-11 MED ORDER — SODIUM CHLORIDE 0.9% FLUSH: 3.0000 mL | INTRAVENOUS | Status: DC | PRN

## 2018-06-11 MED ORDER — MUPIROCIN 2 % EX OINT
TOPICAL_OINTMENT | CUTANEOUS | Status: AC
  Administered 2018-06-11: 1 via TOPICAL
  Filled 2018-06-11: qty 22

## 2018-06-11 MED ORDER — CEFAZOLIN SODIUM-DEXTROSE 2-4 GM/100ML-% IV SOLN
INTRAVENOUS | Status: AC
  Filled 2018-06-11: qty 100

## 2018-06-11 MED ORDER — CHLORHEXIDINE GLUCONATE 4 % EX LIQD
60.0000 mL | Freq: Once | CUTANEOUS | Status: DC
  Filled 2018-06-11: qty 60

## 2018-06-11 MED ORDER — ONDANSETRON HCL 4 MG/2ML IJ SOLN: 4.0000 mg | Freq: Four times a day (QID) | INTRAMUSCULAR | Status: DC | PRN

## 2018-06-11 SURGICAL SUPPLY — 5 items
CABLE SURGICAL S-101-97-12 (CABLE) ×3 IMPLANT
IPG PACE AZUR XT DR MRI W1DR01 (Pacemaker) ×1 IMPLANT
PACE AZURE XT DR MRI W1DR01 (Pacemaker) ×3 IMPLANT
PAD PRO RADIOLUCENT 2001M-C (PAD) ×3 IMPLANT
TRAY PACEMAKER INSERTION (PACKS) ×3 IMPLANT

## 2018-06-11 NOTE — Interval H&P Note (Signed)
History and Physical Interval Note:  06/11/2018 7:18 AM  Sylvia Anthony  has presented today for surgery, with the diagnosis of symtomatic  The various methods of treatment have been discussed with the patient and family. After consideration of risks, benefits and other options for treatment, the patient has consented to  Procedure(s): PPM GENERATOR CHANGEOUT (N/A) as a surgical intervention .  The patient's history has been reviewed, patient examined, no change in status, stable for surgery.  I have reviewed the patient's chart and labs.  Questions were answered to the patient's satisfaction.     Hillis Range

## 2018-06-11 NOTE — Discharge Instructions (Signed)
Pacemaker Battery Change, Care After  This sheet gives you information about how to care for yourself after your procedure. Your health care provider may also give you more specific instructions. If you have problems or questions, contact your health care provider.  What can I expect after the procedure?  After your procedure, it is common to have:   Pain or soreness at the site where the pacemaker was inserted.   Swelling at the site where the pacemaker was inserted.  Follow these instructions at home:  Incision care     Keep the incision clean and dry.  ? Do not take baths, swim, or use a hot tub until your health care provider approves.  ? You may shower the day after your procedure, or as directed by your health care provider.  ? Pat the area dry with a clean towel. Do not rub the area. This may cause bleeding.   Follow instructions from your health care provider about how to take care of your incision. Make sure you:  ? Wash your hands with soap and water before you change your bandage (dressing). If soap and water are not available, use hand sanitizer.  ? Change your dressing as told by your health care provider.  ? Leave stitches (sutures), skin glue, or adhesive strips in place. These skin closures may need to stay in place for 2 weeks or longer. If adhesive strip edges start to loosen and curl up, you may trim the loose edges. Do not remove adhesive strips completely unless your health care provider tells you to do that.   Check your incision area every day for signs of infection. Check for:  ? More redness, swelling, or pain.  ? More fluid or blood.  ? Warmth.  ? Pus or a bad smell.  Activity   Do not lift anything that is heavier than 10 lb (4.5 kg) until your health care provider says it is okay to do so.   For the first 2 weeks, or as long as told by your health care provider:  ? Avoid lifting your left arm higher than your shoulder.  ? Be gentle when you move your arms over your head. It is okay  to raise your arm to comb your hair.  ? Avoid strenuous exercise.   Ask your health care provider when it is okay to:  ? Resume your normal activities.  ? Return to work or school.  ? Resume sexual activity.  Eating and drinking   Eat a heart-healthy diet. This should include plenty of fresh fruits and vegetables, whole grains, low-fat dairy products, and lean protein like chicken and fish.   Limit alcohol intake to no more than 1 drink a day for non-pregnant women and 2 drinks a day for men. One drink equals 12 oz of beer, 5 oz of wine, or 1 oz of hard liquor.   Check ingredients and nutrition facts on packaged foods and beverages. Avoid the following types of food:  ? Food that is high in salt (sodium).  ? Food that is high in saturated fat, like full-fat dairy or red meat.  ? Food that is high in trans fat, like fried food.  ? Food and drinks that are high in sugar.  Lifestyle   Do not use any products that contain nicotine or tobacco, such as cigarettes and e-cigarettes. If you need help quitting, ask your health care provider.   Take steps to manage and control your weight.     Get regular exercise. Aim for 150 minutes of moderate-intensity exercise (such as walking or yoga) or 75 minutes of vigorous exercise (such as running or swimming) each week.   Manage other health problems, such as diabetes or high blood pressure. Ask your health care provider how you can manage these conditions.  General instructions   Do not drive for 24 hours after your procedure if you were given a medicine to help you relax (sedative).   Take over-the-counter and prescription medicines only as told by your health care provider.   Avoid putting pressure on the area where the pacemaker was placed.   If you need an MRI after your pacemaker has been placed, be sure to tell the health care provider who orders the MRI that you have a pacemaker.   Avoid close and prolonged exposure to electrical devices that have strong  magnetic fields. These include:  ? Cell phones. Avoid keeping them in a pocket near the pacemaker, and try using the ear opposite the pacemaker.  ? MP3 players.  ? Household appliances, like microwaves.  ? Metal detectors.  ? Electric generators.  ? High-tension wires.   Keep all follow-up visits as directed by your health care provider. This is important.  Contact a health care provider if:   You have pain at the incision site that is not relieved by over-the-counter or prescription medicines.   You have any of these around your incision site or coming from it:  ? More redness, swelling, or pain.  ? Fluid or blood.  ? Warmth to the touch.  ? Pus or a bad smell.   You have a fever.   You feel brief, occasional palpitations, light-headedness, or any symptoms that you think might be related to your heart.  Get help right away if:   You experience chest pain that is different from the pain at the pacemaker site.   You develop a red streak that extends above or below the incision site.   You experience shortness of breath.   You have palpitations or an irregular heartbeat.   You have light-headedness that does not go away quickly.   You faint or have dizzy spells.   Your pulse suddenly drops or increases rapidly and does not return to normal.   You begin to gain weight and your legs and ankles swell.  Summary   After your procedure, it is common to have pain, soreness, and some swelling where the pacemaker was inserted.   Make sure to keep your incision clean and dry. Follow instructions from your health care provider about how to take care of your incision.   Check your incision every day for signs of infection, such as more pain or swelling, pus or a bad smell, warmth, or leaking fluid and blood.   Avoid strenuous exercise and lifting your left arm higher than your shoulder for 2 weeks, or as long as told by your health care provider.  This information is not intended to replace advice given to you by  your health care provider. Make sure you discuss any questions you have with your health care provider.  Document Released: 03/06/2013 Document Revised: 04/07/2016 Document Reviewed: 04/07/2016  Elsevier Interactive Patient Education  2019 [employer]

## 2018-06-12 MED FILL — Lidocaine HCl Local Inj 1%: INTRAMUSCULAR | Qty: 60 | Status: AC

## 2018-06-22 ENCOUNTER — Ambulatory Visit (INDEPENDENT_AMBULATORY_CARE_PROVIDER_SITE_OTHER): Payer: Medicare Other | Admitting: Nurse Practitioner

## 2018-06-22 DIAGNOSIS — I495 Sick sinus syndrome: Secondary | ICD-10-CM | POA: Diagnosis not present

## 2018-06-22 LAB — CUP PACEART INCLINIC DEVICE CHECK
Date Time Interrogation Session: 20200124103533
Implantable Lead Implant Date: 19970416
Implantable Lead Location: 753859
Implantable Lead Location: 753860
Implantable Lead Model: 5034
Implantable Lead Model: 5534
Implantable Pulse Generator Implant Date: 20200113
MDC IDC LEAD IMPLANT DT: 19970416

## 2018-06-22 MED ORDER — ASPIRIN EC 81 MG PO TBEC: 81.0000 mg | DELAYED_RELEASE_TABLET | Freq: Every day | ORAL | 3 refills | Status: AC

## 2018-06-22 NOTE — Progress Notes (Signed)

## 2018-08-07 ENCOUNTER — Encounter: Payer: Self-pay | Admitting: Gastroenterology

## 2018-08-28 ENCOUNTER — Telehealth: Payer: Self-pay | Admitting: Interventional Cardiology

## 2018-08-28 NOTE — Telephone Encounter (Signed)
Attempted to contact Sylvia Anthony in regards to appt scheduled with Dr. Katrinka Blazing on 09/03/18.  Left message with family member to have Sylvia Anthony call back.  Sylvia Anthony needs to be moved out to July or August if doing ok.

## 2018-08-30 NOTE — Telephone Encounter (Signed)
Pt called back yesterday and rescheduled for June

## 2018-09-03 ENCOUNTER — Ambulatory Visit: Payer: Medicare Other | Admitting: Interventional Cardiology

## 2018-09-12 ENCOUNTER — Other Ambulatory Visit: Payer: Self-pay

## 2018-09-12 ENCOUNTER — Ambulatory Visit (INDEPENDENT_AMBULATORY_CARE_PROVIDER_SITE_OTHER): Payer: Medicare Other | Admitting: *Deleted

## 2018-09-12 DIAGNOSIS — I495 Sick sinus syndrome: Secondary | ICD-10-CM

## 2018-09-12 DIAGNOSIS — I6300 Cerebral infarction due to thrombosis of unspecified precerebral artery: Secondary | ICD-10-CM

## 2018-09-12 LAB — CUP PACEART REMOTE DEVICE CHECK
Battery Remaining Longevity: 170 mo
Battery Voltage: 3.19 V
Brady Statistic AP VP Percent: 0.05 %
Brady Statistic AP VS Percent: 76.7 %
Brady Statistic AS VP Percent: 0.01 %
Brady Statistic AS VS Percent: 23.24 %
Brady Statistic RA Percent Paced: 76.87 %
Brady Statistic RV Percent Paced: 0.06 %
Date Time Interrogation Session: 20200415010202
Implantable Lead Implant Date: 19970416
Implantable Lead Implant Date: 19970416
Implantable Lead Location: 753859
Implantable Lead Location: 753860
Implantable Lead Model: 5034
Implantable Lead Model: 5534
Implantable Pulse Generator Implant Date: 20200113
Lead Channel Impedance Value: 646 Ohm
Lead Channel Impedance Value: 665 Ohm
Lead Channel Impedance Value: 703 Ohm
Lead Channel Impedance Value: 741 Ohm
Lead Channel Pacing Threshold Amplitude: 0.625 V
Lead Channel Pacing Threshold Amplitude: 0.75 V
Lead Channel Pacing Threshold Pulse Width: 0.4 ms
Lead Channel Pacing Threshold Pulse Width: 0.4 ms
Lead Channel Sensing Intrinsic Amplitude: 1.875 mV
Lead Channel Sensing Intrinsic Amplitude: 1.875 mV
Lead Channel Sensing Intrinsic Amplitude: 21 mV
Lead Channel Sensing Intrinsic Amplitude: 21 mV
Lead Channel Setting Pacing Amplitude: 1.5 V
Lead Channel Setting Pacing Amplitude: 2.5 V
Lead Channel Setting Pacing Pulse Width: 0.4 ms
Lead Channel Setting Sensing Sensitivity: 2 mV

## 2018-09-18 NOTE — Progress Notes (Signed)
Remote pacemaker transmission.   

## 2018-09-27 ENCOUNTER — Telehealth: Payer: Self-pay

## 2018-09-27 NOTE — Telephone Encounter (Signed)
Spoke with pt regarding appt on 09/28/18. Pt was having trouble accessing her MyChart, but stated she will try again and call me back if she had any issues. Pt was advise to check vitals prior to appt and her questions were address.

## 2018-09-28 ENCOUNTER — Telehealth (INDEPENDENT_AMBULATORY_CARE_PROVIDER_SITE_OTHER): Payer: 59 | Admitting: Internal Medicine

## 2018-09-28 ENCOUNTER — Encounter: Payer: Self-pay | Admitting: Internal Medicine

## 2018-09-28 VITALS — BP 150/77 | HR 61 | Wt 197.0 lb

## 2018-09-28 DIAGNOSIS — I495 Sick sinus syndrome: Secondary | ICD-10-CM

## 2018-09-28 DIAGNOSIS — I10 Essential (primary) hypertension: Secondary | ICD-10-CM

## 2018-09-28 DIAGNOSIS — R0602 Shortness of breath: Secondary | ICD-10-CM

## 2018-09-28 NOTE — Progress Notes (Signed)
Electrophysiology TeleHealth Note   Due to national recommendations of social distancing due to COVID 19, an audio/video telehealth visit is felt to be most appropriate for this patient at this time.  See MyChart message from today for the patient's consent to telehealth for [facility].   Date:  09/28/2018   ID:  Sylvia GaribaldiLiaretta B Anthony, DOB 1956-04-07, MRN [id number]  Location: patient's home  Provider location: [address], [place]  Evaluation Performed: Follow-up visit  PCP:  Ignatius SpeckingVyas, Dhruv B, MD  Cardiologist: Dr Katrinka BlazingSmith Electrophysiologist:  Dr Johney FrameAllred  Chief Complaint:  SOB  History of Present Illness:    Sylvia Anthony is a 63 y.o. female who presents via audio/video conferencing for a telehealth visit today.  Since her pacemaker generator change, the patient reports doing very well. Her device pocket has healed.  She has rare SOB with activity, especially on stairs. Today, she denies symptoms of palpitations, chest pain, lower extremity edema, dizziness, presyncope, or syncope.  The patient is otherwise without complaint today.  The patient denies symptoms of fevers, chills, cough, or new SOB worrisome for COVID 19.  Past Medical History:  Diagnosis Date  . Anemia   . Arm numbness   . Arterial fibromuscular dysplasia (HCC)   . Carotid bruit   . CHF (congestive heart failure) (HCC) 1997   pacemaker  . Fibromyalgia   . GERD (gastroesophageal reflux disease)   . Glucose intolerance (impaired glucose tolerance)   . H. pylori infection Sept 2013   Amoxicillin, Biaxin  . Hyperlipidemia   . Hypertension   . Hypothyroidism   . Mitral valve prolapse   . Peripheral vascular disease (HCC)   . Rheumatic fever    age 89, was [facility]  . SSS (sick sinus syndrome) [facility](HCC)     Past Surgical History:  Procedure Laterality Date  . ABDOMINAL HYSTERECTOMY     partial  . APPENDECTOMY    . CARDIAC CATHETERIZATION     multiple  .  CHOLECYSTECTOMY    . COLONOSCOPY  08/01/2006   3 mm descending colon polyp removed/8 mm sessile ascending colon polyp removed / 3-mm rectal  polyp removed /Rare sigmoid diverticulosis/ Moderate internal hemorrhoids.Advanced adenoma on colonoscopy in March 2008.  The polyp was anadenomatous polyp with a foci of high-grade dysplasia  . COLONOSCOPY  09/08/2008   SIMPLE ADENOMA/HYPERPLASTIC POLY/Multiple colon polyps (ascending, sigmoid, rectal)  Mild sigmoid colon diverticulosis./ Small internal hemorrhoids  . COLONOSCOPY N/A 12/11/2017   Procedure: COLONOSCOPY;  Surgeon: West BaliFields, Sandi L, MD;  [facility];  Service: Endoscopy;  Laterality: N/A;  8:30am  . COLONOSCOPY WITH ESOPHAGOGASTRODUODENOSCOPY (EGD)  Sept. 30, 2013   WUJ:[id number]:Mild diverticulosis was noted in the sigmoid colon/The colon was otherwise normal/Small internal hemorrhoids/EGD:The mucosa of the esophagus appeared normal/Non-erosive gastritis (inflammation) was found; multiple bx/The duodenal mucosa showed no abnormalities. +H.pylori gastritis, treated with equivalent of prevpac. SAVARY DILATION  . ESOPHAGEAL DILATION N/A 10/07/2014   Procedure: ESOPHAGEAL DILATION;  Surgeon: West BaliSandi L Fields, MD;  Location: [facility];  Service: Endoscopy;  Laterality: N/A;  . ESOPHAGOGASTRODUODENOSCOPY N/A 10/07/2014   Dr. Darrick PennaFields: moderate non-erosive gastritis, no definite stricture, empiric dilation . negative H.pylori   . growth removed from intestine     [facility]-as teenager, done through colonoscopy  . PACEMAKER GENERATOR CHANGE  09/18/2006   generator change by Dr Amil AmenEdmunds with a MDT [place] L  . PACEMAKER INSERTION  09/13/1995   for sick sinus  syndome and syncope at [facility]  . PPM GENERATOR CHANGEOUT N/A 06/11/2018   Procedure: PPM GENERATOR CHANGEOUT;  Surgeon: Hillis Range, MD;  [facility] IN[facility];  Service: Cardiovascular;  Laterality: N/A;  . right Achilles tendon     X 2  . TONSILLECTOMY      Current Outpatient  Medications  Medication Sig Dispense Refill  . albuterol (PROVENTIL HFA;VENTOLIN HFA) 108 (90 BASE) MCG/ACT inhaler Inhale 2 puffs into the lungs every 6 (six) hours as needed for wheezing or shortness of breath.    Marland Kitchen aspirin EC 81 MG tablet Take 1 tablet (81 mg total) by mouth daily. 90 tablet 3  . cetirizine (ZYRTEC) 10 MG tablet Take 10 mg by mouth at bedtime as needed for allergies.     Marland Kitchen diclofenac (VOLTAREN) 75 MG EC tablet Take 75 mg by mouth daily as needed for mild pain.    Marland Kitchen HYDROcodone-acetaminophen (NORCO/VICODIN) 5-325 MG tablet Take 1 tablet by mouth every 6 (six) hours as needed for pain.     Marland Kitchen levothyroxine (SYNTHROID, LEVOTHROID) 112 MCG tablet Take 112 mcg by mouth daily before breakfast.     . nitroGLYCERIN (NITROSTAT) 0.4 MG SL tablet Place 1 tablet (0.4 mg total) under the tongue every 5 (five) minutes as needed for chest pain. 25 tablet 3  . pantoprazole (PROTONIX) 40 MG tablet Take 1 tablet (40 mg total) by mouth 2 (two) times daily before a meal. 60 tablet 5  . potassium chloride SA (K-DUR,KLOR-CON) 20 MEQ tablet Take 20 mEq by mouth daily.     . rosuvastatin (CRESTOR) 20 MG tablet Take 20 mg by mouth daily.  6  . traMADol (ULTRAM) 50 MG tablet Take 50 mg by mouth every 4 (four) hours as needed (migraines).    . valsartan-hydrochlorothiazide (DIOVAN-HCT) 320-25 MG per tablet Take 1 tablet by mouth daily.       No current facility-administered medications for this visit.     Allergies:   Gabapentin   Social History:  The patient  reports that she quit smoking about 32 years ago. She has a 1.00 pack-year smoking history. She has never used smokeless tobacco. She reports that she does not drink alcohol or use drugs.   Family History:  The patient's family history includes Colon polyps in her brother and sister; Heart disease in her father; Hyperlipidemia in her brother, mother, and sister; Hypertension in her brother, father, mother, and sister.   ROS:  Please see the  history of present illness.   All other systems are personally reviewed and negative.    Exam:    Vital Signs:  There were no vitals taken for this visit.  Well appearing, alert and conversant, regular work of breathing,  good skin color Eyes- anicteric, neuro- grossly intact, skin- no apparent rash or lesions or cyanosis, mouth- oral mucosa is pink Device pocket is well healed, small keloid  Labs/Other Tests and Data Reviewed:    Recent Labs: 06/08/2018: BUN 9; Creat 0.89; Hemoglobin 14.8; Platelets 293; Potassium 3.7; Sodium 142   Wt Readings from Last 3 Encounters:  06/11/18 203 lb (92.1 kg)  06/08/18 203 lb 9.6 oz (92.4 kg)  12/29/17 202 lb (91.6 kg)     Other studies personally reviewed: Additional studies/ records that were reviewed today include: my last office note, wound check note, procedure note  Review of the above records today demonstrates: as above   Last device remote is reviewed from P[employer] PDF dated 09/12/2018 which reveals  normal device function, no arrhythmias    ASSESSMENT & PLAN:    1.  Sinus bradycardia Normal device function by remote uptodate on remotes Histograms are ok  2. HTN Stable No change required today  3. Overweight She has lost 6 lbs since I saw her last  4. SOB Likely due to inactivity I have advised exercise and weight loss If worsens, she will contact my office  5. COVID 19 screen The patient denies symptoms of COVID 19 at this time.  The importance of social distancing was discussed today.  Follow-up:  12 months with me Next remote: 11/2018  Current medicines are reviewed at length with the patient today.   The patient does not have concerns regarding her medicines.  The following changes were made today:  none  Labs/ tests ordered today include:  No orders of the defined types were placed in this encounter.  Patient Risk:  after full review of this patients clinical status, I feel that they are at moderate risk at this  time.  Today, I have spent 15  minutes with the patient with telehealth technology discussing HTN and SOB .    Randolm Idol, MD  09/28/2018 2:39 PM     [facility] [address] [place] [address] [phone] (office) [phone] (fax)

## 2018-11-19 ENCOUNTER — Telehealth: Payer: Self-pay | Admitting: Interventional Cardiology

## 2018-11-19 NOTE — Progress Notes (Signed)
Cardiology Office Note:    Date:  11/20/2018   ID:  Sylvia Anthony, DOB 07/15/1955, MRN [id number]  PCP:  Glenda Chroman, MD  Cardiologist:  No primary care provider on file.   Referring MD: Glenda Chroman, MD   Chief Complaint  Patient presents with   Hypertension   Pacemaker Problem    History of Present Illness:    Sylvia Anthony is a 63 y.o. female with a hx of SSS, PPM, hypertension, CVA, and chronic dyspnea.  She is having very significant pain at the incision site of her relatively recent pacemaker power Swords change out (January 2020).  It is painful enough that she now has difficulty with intimacy with her husband because any touching of the site causes exquisite pain.  The pain can also radiate into her neck.  She denies shortness of breath, chest pain, syncope, palpitations, and has not noted edema in her legs.  No medication side effects.  She is on diclofenac for fibromyalgia.  Past Medical History:  Diagnosis Date   Anemia    Arm numbness    Arterial fibromuscular dysplasia (HCC)    Carotid bruit    CHF (congestive heart failure) ([place]) 1997   pacemaker   Fibromyalgia    GERD (gastroesophageal reflux disease)    Glucose intolerance (impaired glucose tolerance)    H. pylori infection Sept 2013   Amoxicillin, Biaxin   Hyperlipidemia    Hypertension    Hypothyroidism    Mitral valve prolapse    Peripheral vascular disease (HCC)    Rheumatic fever    age 35, was at [facility] for [facility] weeks   SSS (sick sinus syndrome) [facility]     Past Surgical History:  Procedure Laterality Date   ABDOMINAL HYSTERECTOMY     partial   APPENDECTOMY     CARDIAC CATHETERIZATION     multiple   CHOLECYSTECTOMY     COLONOSCOPY  08/01/2006   3 mm descending colon polyp removed/8 mm sessile ascending colon polyp removed / 3-mm rectal  polyp removed /Rare sigmoid diverticulosis/ Moderate internal hemorrhoids.Advanced adenoma on colonoscopy in  March 2008.  The polyp was anadenomatous polyp with a foci of high-grade dysplasia   COLONOSCOPY  09/08/2008   SIMPLE ADENOMA/HYPERPLASTIC POLY/Multiple colon polyps (ascending, sigmoid, rectal)  Mild sigmoid colon diverticulosis./ Small internal hemorrhoids   COLONOSCOPY N/A 12/11/2017   Procedure: COLONOSCOPY;  Surgeon: Danie Binder, MD;  [facility];  Service: Endoscopy;  Laterality: N/A;  8:30am   COLONOSCOPY WITH ESOPHAGOGASTRODUODENOSCOPY (EGD)  Sept. 30, 2013   CWU:GQBV diverticulosis was noted in the sigmoid colon/The colon was otherwise normal/Small internal hemorrhoids/EGD:The mucosa of the esophagus appeared normal/Non-erosive gastritis (inflammation) was found; multiple bx/The duodenal mucosa showed no abnormalities. +H.pylori gastritis, treated with equivalent of prevpac. SAVARY DILATION   ESOPHAGEAL DILATION N/A 10/07/2014   Procedure: ESOPHAGEAL DILATION;  Surgeon: Danie Binder, MD;  [facility];  Service: Endoscopy;  Laterality: N/A;   ESOPHAGOGASTRODUODENOSCOPY N/A 10/07/2014   Dr. Oneida Alar: moderate non-erosive gastritis, no definite stricture, empiric dilation . negative H.pylori    growth removed from intestine     [facility]-as teenager, done through colonoscopy   PACEMAKER GENERATOR CHANGE  09/18/2006   generator change by Dr Leonia Reeves with a MDT [place]  09/13/1995   for sick sinus syndome and syncope at [place] N/A 06/11/2018    Medtronic Azure XT DR MRI SureScan  model [id number] (serial number [id number]) pacemaker by Dr Johney FrameAllred   right Achilles tendon     X 2   TONSILLECTOMY      Current Medications: Current Meds  Medication Sig   albuterol (PROVENTIL HFA;VENTOLIN HFA) 108 (90 BASE) MCG/ACT inhaler Inhale 2 puffs into the lungs every 6 (six) hours as needed for wheezing or shortness of breath.   aspirin EC 81 MG tablet Take 1 tablet (81 mg total) by mouth daily.   cetirizine  (ZYRTEC) 10 MG tablet Take 10 mg by mouth at bedtime as needed for allergies.    diclofenac (VOLTAREN) 75 MG EC tablet Take 75 mg by mouth daily as needed for mild pain.   HYDROcodone-acetaminophen (NORCO/VICODIN) 5-325 MG tablet Take 1 tablet by mouth every 6 (six) hours as needed for pain.    levothyroxine (SYNTHROID, LEVOTHROID) 112 MCG tablet Take 112 mcg by mouth daily before breakfast.    nitroGLYCERIN (NITROSTAT) 0.4 MG SL tablet Place 1 tablet (0.4 mg total) under the tongue every 5 (five) minutes as needed for chest pain.   pantoprazole (PROTONIX) 40 MG tablet Take 1 tablet (40 mg total) by mouth 2 (two) times daily before a meal.   potassium chloride SA (K-DUR,KLOR-CON) 20 MEQ tablet Take 20 mEq by mouth daily.    rosuvastatin (CRESTOR) 20 MG tablet Take 20 mg by mouth daily.   traMADol (ULTRAM) 50 MG tablet Take 50 mg by mouth every 4 (four) hours as needed (migraines).   valsartan-hydrochlorothiazide (DIOVAN-HCT) 320-25 MG per tablet Take 1 tablet by mouth daily.       Allergies:   Gabapentin   Social History   Socioeconomic History   Marital status: Married    Spouse name: Not on file   Number of children: 1   Years of education: Not on file   Highest education level: Not on file  Occupational History   Occupation: part-time hairdresser  Ecologistocial Needs   Financial resource strain: Not on file   Food insecurity    Worry: Not on file    Inability: Not on file   Transportation needs    Medical: Not on file    Non-medical: Not on file  Tobacco Use   Smoking status: Former Smoker    Packs/day: 0.25    Years: 4.00    Pack years: 1.00    Quit date: 10/09/1985    Years since quitting: 33.1   Smokeless tobacco: Never Used   Tobacco comment: just in teens  Substance and Sexual Activity   Alcohol use: No    Alcohol/week: 0.0 standard drinks   Drug use: No   Sexual activity: Not on file  Lifestyle   Physical activity    Days per week: Not on file      Minutes per session: Not on file   Stress: Not on file  Relationships   Social connections    Talks on phone: Not on file    Gets together: Not on file    Attends religious service: Not on file    Active member of club or organization: Not on file    Attends meetings of clubs or organizations: Not on file    Relationship status: Not on file  Other Topics Concern   Not on file  Social History Narrative   Lives in [place] with [place].  Son is healthy at age 65.   Disabled           Family History: The patient's family history includes Colon polyps  in her brother and sister; Heart disease in her father; Hyperlipidemia in her brother, mother, and sister; Hypertension in her brother, father, mother, and sister. There is no history of Colon cancer.  ROS:   Please see the history of present illness.    Pacemaker site neurogenic pain.  Otherwise no complaints.  All other systems reviewed and are negative.  EKGs/Labs/Other Studies Reviewed:    The following studies were reviewed today: No new cardiac imaging  EKG:  EKG performed on June 11, 2018 demonstrates atrial pacing with ventricular pacing and occasional atrial tracking.  Prominent voltage with secondary T wave abnormality likely related to repolarization memory due to pacing.  Recent Labs: 06/08/2018: BUN 9; Creat 0.89; Hemoglobin 14.8; Platelets 293; Potassium 3.7; Sodium 142  Recent Lipid Panel No results found for: CHOL, TRIG, HDL, CHOLHDL, VLDL, LDLCALC, LDLDIRECT  Physical Exam:    VS:  BP (!) 148/96    Pulse 78    Ht 5\' 4"  (1.626 m)    Wt 198 lb 9.6 oz (90.1 kg)    SpO2 96%    BMI 34.09 kg/m     Wt Readings from Last 3 Encounters:  11/20/18 198 lb 9.6 oz (90.1 kg)  09/28/18 197 lb (89.4 kg)  06/11/18 203 lb (92.1 kg)     GEN: Moderate obesity. No acute distress HEENT: Normal NECK: No JVD. LYMPHATICS: No lymphadenopathy CARDIAC: RRR.  No murmur, no gallop, no edema VASCULAR: 2+ bilateral radial pulses,  no bruits RESPIRATORY:  Clear to auscultation without rales, wheezing or rhonchi. Exquisitely tender pacemaker site.  No warmth or fluctuance is noted.  Keloid formation at the pacer site. ABDOMEN: Soft, non-tender, non-distended, No pulsatile mass, MUSCULOSKELETAL: No deformity  SKIN: Warm and dry NEUROLOGIC:  Alert and oriented x 3 PSYCHIATRIC:  Normal affect   ASSESSMENT:    1. Sick sinus syndrome (HCC)   2. Pacemaker   3. DOE (dyspnea on exertion)   4. Essential hypertension   5. Cerebrovascular accident (CVA) due to thrombosis of precerebral artery (HCC)   6. Educated About Covid-19 Virus Infection    PLAN:    In order of problems listed above:  1. Pacemaker therapy of sick sinus syndrome.  No cardiac symptoms. 2. The pacemaker incision site has keloid and is tender to touch.  She is now 6 months out.  She has mentioned this to Dr. Johney FrameAllred. 3. Resolved 4. Elevated blood pressures above the target of 130/80.  Possible explanations include increased weight, salt in diet, and effect of diclofenac.  She will monitor pressure at home.  If it remains elevated.  We will make some adjustments in her therapy. 5. Has never had a CVA.  This problem needs to be removed from her problem list.  I cautioned her concerning the use of non-steroidal anti-inflammatory agents, excessive salt intake, sedentary lifestyle, as they may influence blood pressure control.   Medication Adjustments/Labs and Tests Ordered: Current medicines are reviewed at length with the patient today.  Concerns regarding medicines are outlined above.  No orders of the defined types were placed in this encounter.  No orders of the defined types were placed in this encounter.   There are no Patient Instructions on file for this visit.   Signed, Lesleigh NoeHenry W Jacquelynne Guedes III, MD  11/20/2018 8:49 AM    [facility]

## 2018-11-19 NOTE — Telephone Encounter (Signed)

## 2018-11-20 ENCOUNTER — Other Ambulatory Visit: Payer: Self-pay

## 2018-11-20 ENCOUNTER — Ambulatory Visit (INDEPENDENT_AMBULATORY_CARE_PROVIDER_SITE_OTHER): Payer: 59 | Admitting: Interventional Cardiology

## 2018-11-20 ENCOUNTER — Encounter: Payer: Self-pay | Admitting: Interventional Cardiology

## 2018-11-20 VITALS — BP 148/96 | HR 78 | Ht 64.0 in | Wt 198.6 lb

## 2018-11-20 DIAGNOSIS — Z950 Presence of cardiac pacemaker: Secondary | ICD-10-CM

## 2018-11-20 DIAGNOSIS — I495 Sick sinus syndrome: Secondary | ICD-10-CM

## 2018-11-20 DIAGNOSIS — I10 Essential (primary) hypertension: Secondary | ICD-10-CM | POA: Diagnosis not present

## 2018-11-20 DIAGNOSIS — Z7189 Other specified counseling: Secondary | ICD-10-CM

## 2018-11-20 DIAGNOSIS — R0609 Other forms of dyspnea: Secondary | ICD-10-CM

## 2018-11-20 DIAGNOSIS — I6300 Cerebral infarction due to thrombosis of unspecified precerebral artery: Secondary | ICD-10-CM

## 2018-11-20 NOTE — Patient Instructions (Signed)
Medication Instructions:  Your physician recommends that you continue on your current medications as directed. Please refer to the Current Medication list given to you today.  If you need a refill on your cardiac medications before your next appointment, please call your pharmacy.   Lab work: None If you have labs (blood work) drawn today and your tests are completely normal, you will receive your results only by: Marland Kitchen MyChart Message (if you have MyChart) OR . A paper copy in the mail If you have any lab test that is abnormal or we need to change your treatment, we will call you to review the results.  Testing/Procedures: None  Follow-Up: At [facility], you and your health needs are our priority.  As part of our continuing mission to provide you with exceptional heart care, we have created designated Provider Care Teams.  These Care Teams include your primary Cardiologist (physician) and Advanced Practice Providers (APPs -  Physician Assistants and Nurse Practitioners) who all work together to provide you with the care you need, when you need it. You will need a follow up appointment in 12 months.  Please call our office 2 months in advance to schedule this appointment.  You may see Dr. Tamala Julian or one of the following Advanced Practice Providers on your designated Care Team:   Truitt Merle, NP Cecilie Kicks, NP . Kathyrn Drown, NP  Any Other Special Instructions Will Be Listed Below (If Applicable).  Please monitor your blood pressure a couple of times a week.  Check it at least 2 hours after medications.  Call us with those readings in 2-3 weeks.

## 2018-12-12 ENCOUNTER — Ambulatory Visit (INDEPENDENT_AMBULATORY_CARE_PROVIDER_SITE_OTHER): Payer: Medicare Other | Admitting: *Deleted

## 2018-12-12 DIAGNOSIS — I495 Sick sinus syndrome: Secondary | ICD-10-CM

## 2018-12-12 LAB — CUP PACEART REMOTE DEVICE CHECK
Battery Remaining Longevity: 167 mo
Battery Voltage: 3.15 V
Brady Statistic AP VP Percent: 0.03 %
Brady Statistic AP VS Percent: 73.41 %
Brady Statistic AS VP Percent: 0.02 %
Brady Statistic AS VS Percent: 26.55 %
Brady Statistic RA Percent Paced: 73.57 %
Brady Statistic RV Percent Paced: 0.04 %
Date Time Interrogation Session: 20200715020054
Implantable Lead Implant Date: 19970416
Implantable Lead Implant Date: 19970416
Implantable Lead Location: 753859
Implantable Lead Location: 753860
Implantable Lead Model: 5034
Implantable Lead Model: 5534
Implantable Pulse Generator Implant Date: 20200113
Lead Channel Impedance Value: 646 Ohm
Lead Channel Impedance Value: 665 Ohm
Lead Channel Impedance Value: 684 Ohm
Lead Channel Impedance Value: 760 Ohm
Lead Channel Pacing Threshold Amplitude: 0.625 V
Lead Channel Pacing Threshold Amplitude: 0.875 V
Lead Channel Pacing Threshold Pulse Width: 0.4 ms
Lead Channel Pacing Threshold Pulse Width: 0.4 ms
Lead Channel Sensing Intrinsic Amplitude: 1.875 mV
Lead Channel Sensing Intrinsic Amplitude: 1.875 mV
Lead Channel Sensing Intrinsic Amplitude: 22.625 mV
Lead Channel Sensing Intrinsic Amplitude: 22.625 mV
Lead Channel Setting Pacing Amplitude: 1.5 V
Lead Channel Setting Pacing Amplitude: 2.5 V
Lead Channel Setting Pacing Pulse Width: 0.4 ms
Lead Channel Setting Sensing Sensitivity: 2 mV

## 2018-12-17 ENCOUNTER — Other Ambulatory Visit: Payer: Self-pay

## 2018-12-17 ENCOUNTER — Encounter: Payer: Self-pay | Admitting: Gastroenterology

## 2018-12-17 ENCOUNTER — Ambulatory Visit (INDEPENDENT_AMBULATORY_CARE_PROVIDER_SITE_OTHER): Payer: 59 | Admitting: Gastroenterology

## 2018-12-17 VITALS — BP 148/90 | HR 84 | Temp 96.8°F | Ht 64.0 in | Wt 196.2 lb

## 2018-12-17 DIAGNOSIS — K5900 Constipation, unspecified: Secondary | ICD-10-CM

## 2018-12-17 DIAGNOSIS — K219 Gastro-esophageal reflux disease without esophagitis: Secondary | ICD-10-CM | POA: Diagnosis not present

## 2018-12-17 NOTE — Assessment & Plan Note (Signed)
Doing well.  Maintaining regular bowel movements with dietary changes.

## 2018-12-17 NOTE — Assessment & Plan Note (Signed)
Doing well.  She has been on pantoprazole 40 mg twice daily for several years.  We will have her try to back down to once a day to see if tolerated.  She will let us know if she is able to do this.  Follow-up in 1 year.

## 2018-12-17 NOTE — Patient Instructions (Signed)
1. Try to decrease pantoprazole to once daily before a meal. If you notice increased symptoms of heartburn, reflux, abdominal pain, you can go back to twice daily. In a few weeks, let us know if you were able to reduce your dose. 2. Return to the office in one year or call sooner if needed.

## 2018-12-17 NOTE — Progress Notes (Signed)
Primary Care Physician: Glenda Chroman, MD  Primary Gastroenterologist:  Barney Drain, MD   Chief Complaint  Patient presents with  . Constipation    BM's are once a day  . Gastroesophageal Reflux    doing fine    HPI: Sylvia Anthony is a 63 y.o. female here for follow-up.  She has a history of GERD, colon polyps, constipation.  Last colonoscopy July 2019, diverticulosis, hemorrhoids.  Next colonoscopy in 5 years for personal history of colon polyps.  BM regular. No melena, brbpr. No abdominal pain. Reflux well controlled. No dysphagia, vomiting. Has been on pantoprazole twice daily for five years. Rarely misses second dose, no breakthrough symptoms when this occurs.    Current Outpatient Medications  Medication Sig Dispense Refill  . albuterol (PROVENTIL HFA;VENTOLIN HFA) 108 (90 BASE) MCG/ACT inhaler Inhale 2 puffs into the lungs every 6 (six) hours as needed for wheezing or shortness of breath.    Marland Kitchen aspirin EC 81 MG tablet Take 1 tablet (81 mg total) by mouth daily. 90 tablet 3  . cetirizine (ZYRTEC) 10 MG tablet Take 10 mg by mouth at bedtime as needed for allergies.     Marland Kitchen diclofenac (VOLTAREN) 75 MG EC tablet Take 75 mg by mouth daily as needed for mild pain.    Marland Kitchen HYDROcodone-acetaminophen (NORCO/VICODIN) 5-325 MG tablet Take 1 tablet by mouth every 6 (six) hours as needed for pain.     Marland Kitchen levothyroxine (SYNTHROID, LEVOTHROID) 112 MCG tablet Take 112 mcg by mouth daily before breakfast.     . nitroGLYCERIN (NITROSTAT) 0.4 MG SL tablet Place 1 tablet (0.4 mg total) under the tongue every 5 (five) minutes as needed for chest pain. 25 tablet 3  . pantoprazole (PROTONIX) 40 MG tablet Take 1 tablet (40 mg total) by mouth 2 (two) times daily before a meal. 60 tablet 5  . potassium chloride SA (K-DUR,KLOR-CON) 20 MEQ tablet Take 20 mEq by mouth daily.     . rosuvastatin (CRESTOR) 20 MG tablet Take 20 mg by mouth daily.  6  . traMADol (ULTRAM) 50 MG tablet Take 50 mg by mouth  every 4 (four) hours as needed (migraines).    . valsartan-hydrochlorothiazide (DIOVAN-HCT) 320-25 MG per tablet Take 1 tablet by mouth daily.       No current facility-administered medications for this visit.     Allergies as of 12/17/2018 - Review Complete 12/17/2018  Allergen Reaction Noted  . Gabapentin  12/23/2016    ROS:  General: Negative for anorexia, weight loss, fever, chills, fatigue, weakness. ENT: Negative for hoarseness, difficulty swallowing , nasal congestion. CV: Negative for chest pain, angina, palpitations, dyspnea on exertion, peripheral edema.  Respiratory: Negative for dyspnea at rest, dyspnea on exertion, cough, sputum, wheezing.  GI: See history of present illness. GU:  Negative for dysuria, hematuria, urinary incontinence, urinary frequency, nocturnal urination.  Endo: Negative for unusual weight change.    Physical Examination:   BP (!) 148/90   Pulse 84   Temp (!) 96.8 F (36 C) (Oral)   Ht 5\' 4"  (1.626 m)   Wt 196 lb 3.2 oz (89 kg)   BMI 33.68 kg/m   General: Well-nourished, well-developed in no acute distress.  Eyes: No icterus. Mouth: Oropharyngeal mucosa moist and pink , no lesions erythema or exudate. Abdomen: Bowel sounds are normal, nontender, nondistended, no hepatosplenomegaly or masses, no abdominal bruits or hernia , no rebound or guarding.   Extremities: No lower extremity edema. No clubbing  or deformities. Neuro: Alert and oriented x 4   Skin: Warm and dry, no jaundice.   Psych: Alert and cooperative, normal mood and affect.

## 2018-12-17 NOTE — Progress Notes (Signed)
cc'ed to pcp °

## 2018-12-18 ENCOUNTER — Ambulatory Visit: Payer: Medicare Other | Admitting: Interventional Cardiology

## 2018-12-21 ENCOUNTER — Encounter: Payer: Self-pay | Admitting: Cardiology

## 2018-12-21 NOTE — Progress Notes (Signed)
Remote pacemaker transmission.   

## 2019-01-04 ENCOUNTER — Encounter: Payer: Medicare Other | Admitting: Internal Medicine

## 2019-03-13 ENCOUNTER — Ambulatory Visit (INDEPENDENT_AMBULATORY_CARE_PROVIDER_SITE_OTHER): Payer: 59 | Admitting: *Deleted

## 2019-03-13 DIAGNOSIS — I495 Sick sinus syndrome: Secondary | ICD-10-CM | POA: Diagnosis not present

## 2019-03-13 LAB — CUP PACEART REMOTE DEVICE CHECK
Battery Remaining Longevity: 164 mo
Battery Voltage: 3.11 V
Brady Statistic AP VP Percent: 0.08 %
Brady Statistic AP VS Percent: 83.72 %
Brady Statistic AS VP Percent: 0.01 %
Brady Statistic AS VS Percent: 16.19 %
Brady Statistic RA Percent Paced: 83.92 %
Brady Statistic RV Percent Paced: 0.09 %
Date Time Interrogation Session: 20201014004357
Implantable Lead Implant Date: 19970416
Implantable Lead Implant Date: 19970416
Implantable Lead Location: 753859
Implantable Lead Location: 753860
Implantable Lead Model: 5034
Implantable Lead Model: 5534
Implantable Pulse Generator Implant Date: 20200113
Lead Channel Impedance Value: 665 Ohm
Lead Channel Impedance Value: 665 Ohm
Lead Channel Impedance Value: 703 Ohm
Lead Channel Impedance Value: 741 Ohm
Lead Channel Pacing Threshold Amplitude: 0.75 V
Lead Channel Pacing Threshold Amplitude: 0.75 V
Lead Channel Pacing Threshold Pulse Width: 0.4 ms
Lead Channel Pacing Threshold Pulse Width: 0.4 ms
Lead Channel Sensing Intrinsic Amplitude: 2.25 mV
Lead Channel Sensing Intrinsic Amplitude: 2.25 mV
Lead Channel Sensing Intrinsic Amplitude: 20.625 mV
Lead Channel Sensing Intrinsic Amplitude: 20.625 mV
Lead Channel Setting Pacing Amplitude: 1.5 V
Lead Channel Setting Pacing Amplitude: 2.5 V
Lead Channel Setting Pacing Pulse Width: 0.4 ms
Lead Channel Setting Sensing Sensitivity: 2 mV

## 2019-03-25 NOTE — Progress Notes (Signed)
Remote pacemaker transmission.   

## 2019-04-07 ENCOUNTER — Encounter (HOSPITAL_COMMUNITY): Payer: Self-pay

## 2019-04-07 ENCOUNTER — Emergency Department (HOSPITAL_COMMUNITY)
Admission: EM | Admit: 2019-04-07 | Discharge: 2019-04-07 | Disposition: A | Discharge status: Home / Self Care | Payer: 59 | Attending: Emergency Medicine | Admitting: Emergency Medicine

## 2019-04-07 ENCOUNTER — Other Ambulatory Visit: Payer: Self-pay

## 2019-04-07 DIAGNOSIS — X503XXA Overexertion from repetitive movements, initial encounter: Secondary | ICD-10-CM | POA: Diagnosis not present

## 2019-04-07 DIAGNOSIS — Y999 Unspecified external cause status: Secondary | ICD-10-CM | POA: Insufficient documentation

## 2019-04-07 DIAGNOSIS — I509 Heart failure, unspecified: Secondary | ICD-10-CM | POA: Diagnosis not present

## 2019-04-07 DIAGNOSIS — R05 Cough: Secondary | ICD-10-CM | POA: Diagnosis not present

## 2019-04-07 DIAGNOSIS — E039 Hypothyroidism, unspecified: Secondary | ICD-10-CM | POA: Diagnosis not present

## 2019-04-07 DIAGNOSIS — Y929 Unspecified place or not applicable: Secondary | ICD-10-CM | POA: Insufficient documentation

## 2019-04-07 DIAGNOSIS — I110 Hypertensive heart disease with heart failure: Secondary | ICD-10-CM | POA: Insufficient documentation

## 2019-04-07 DIAGNOSIS — Z7982 Long term (current) use of aspirin: Secondary | ICD-10-CM | POA: Diagnosis not present

## 2019-04-07 DIAGNOSIS — S3992XA Unspecified injury of lower back, initial encounter: Secondary | ICD-10-CM | POA: Diagnosis present

## 2019-04-07 DIAGNOSIS — Z87891 Personal history of nicotine dependence: Secondary | ICD-10-CM | POA: Insufficient documentation

## 2019-04-07 DIAGNOSIS — S39012A Strain of muscle, fascia and tendon of lower back, initial encounter: Secondary | ICD-10-CM | POA: Insufficient documentation

## 2019-04-07 DIAGNOSIS — Z79899 Other long term (current) drug therapy: Secondary | ICD-10-CM | POA: Insufficient documentation

## 2019-04-07 DIAGNOSIS — Z950 Presence of cardiac pacemaker: Secondary | ICD-10-CM | POA: Diagnosis not present

## 2019-04-07 DIAGNOSIS — Y9389 Activity, other specified: Secondary | ICD-10-CM | POA: Diagnosis not present

## 2019-04-07 MED ORDER — CYCLOBENZAPRINE HCL 10 MG PO TABS: 10.0000 mg | ORAL_TABLET | Freq: Three times a day (TID) | ORAL | 0 refills | Status: DC

## 2019-04-07 MED ORDER — IBUPROFEN 400 MG PO TABS
400.0000 mg | ORAL_TABLET | Freq: Once | ORAL | Status: AC
  Administered 2019-04-07: 400 mg via ORAL
  Filled 2019-04-07: qty 1

## 2019-04-07 MED ORDER — CYCLOBENZAPRINE HCL 10 MG PO TABS
10.0000 mg | ORAL_TABLET | Freq: Once | ORAL | Status: AC
  Administered 2019-04-07: 10 mg via ORAL
  Filled 2019-04-07: qty 1

## 2019-04-07 MED ORDER — ONDANSETRON HCL 4 MG PO TABS
4.0000 mg | ORAL_TABLET | Freq: Once | ORAL | Status: AC
  Administered 2019-04-07: 4 mg via ORAL
  Filled 2019-04-07: qty 1

## 2019-04-07 MED ORDER — TRAMADOL HCL 50 MG PO TABS: ORAL_TABLET | ORAL | 0 refills | Status: DC

## 2019-04-07 MED ORDER — TRAMADOL HCL 50 MG PO TABS
100.0000 mg | ORAL_TABLET | Freq: Once | ORAL | Status: AC
  Administered 2019-04-07: 100 mg via ORAL
  Filled 2019-04-07: qty 2

## 2019-04-07 NOTE — Discharge Instructions (Addendum)
Please use a heating pad to your lower back.  Please rest her back is much as possible.  Use 2 tablets of Tylenol with breakfast, lunch, dinner, and at bedtime.  Use Flexeril 3 times daily for spasm pain.  May use Ultram at bedtime for severe pain if needed.  Ultram and Flexeril may cause drowsiness.  Please do not drive a vehicle, operate machinery, drink alcohol, or participate in activities requiring concentration when taking either of these medications.  Please follow-up with Dr. Woody Seller if not improving.

## 2019-04-07 NOTE — ED Provider Notes (Addendum)
[facility]GENCY DEPARTMENT Provider Note   CSN: [id number] Arrival date & time: 04/07/19  0940     History   Chief Complaint Chief Complaint  Patient presents with  . Back Pain    HPI Sylvia Anthony is a 63 y.o. female.     Patient is a 63 year old female who presents to the emergency department with complaint of back pain.  The patient states she has been having a problem with cough that started 3 or 4 days ago.  She was seen by her primary physician 2 days ago.  She was given a shot of steroids.  Patient states the cough is improving, but she feels that she may have pulled something in her back.  She says particularly on last night she had a hard time finding a comfortable position.  The pain is better when she is lying still, it is worse when she is up and about or turning or twisting.  There is been no urinary symptoms.  No fever reported.  There is been no recent operations or procedures involving the back.  No nausea or vomiting reported.  The history is provided by the patient.    Past Medical History:  Diagnosis Date  . Anemia   . Arm numbness   . Arterial fibromuscular dysplasia (HCC)   . Carotid bruit   . CHF (congestive heart failure) (HCC) 1997   pacemaker  . Fibromyalgia   . GERD (gastroesophageal reflux disease)   . Glucose intolerance (impaired glucose tolerance)   . H. pylori infection Sept 2013   Amoxicillin, Biaxin  . Hyperlipidemia   . Hypertension   . Hypothyroidism   . Mitral valve prolapse   . Peripheral vascular disease (HCC)   . Rheumatic fever    age 63, [facility]  . SSS (sick sinus syndrome) [facility](HCC)     Patient Active Problem List   Diagnosis Date Noted  . Dysphagia, pharyngoesophageal phase   . Hypertensive heart disease 09/15/2014  . Helicobacter pylori gastritis 08/05/2013  . Essential hypertension 07/08/2013  . Sick sinus syndrome (HCC) 07/08/2013  . Pacemaker 07/08/2013  . Neck pain, acute  06/20/2012  . Constipation 01/31/2012  . Hx of adenomatous colonic polyps 01/31/2012  . GERD (gastroesophageal reflux disease) 01/31/2012  . Esophageal dysphagia 01/31/2012  . Occlusion and stenosis of carotid artery without mention of cerebral infarction 05/18/2011    Past Surgical History:  Procedure Laterality Date  . ABDOMINAL HYSTERECTOMY     partial  . APPENDECTOMY    . CARDIAC CATHETERIZATION     multiple  . CHOLECYSTECTOMY    . COLONOSCOPY  08/01/2006   3 mm descending colon polyp removed/8 mm sessile ascending colon polyp removed / 3-mm rectal  polyp removed /Rare sigmoid diverticulosis/ Moderate internal hemorrhoids.Advanced adenoma on colonoscopy in March 2008.  The polyp was anadenomatous polyp with a foci of high-grade dysplasia  . COLONOSCOPY  09/08/2008   SIMPLE ADENOMA/HYPERPLASTIC POLY/Multiple colon polyps (ascending, sigmoid, rectal)  Mild sigmoid colon diverticulosis./ Small internal hemorrhoids  . COLONOSCOPY N/A 12/11/2017   Dr. Darrick PennaFields: diverticulosis, hemorrhoids next surveillance tcs 5 years.   . COLONOSCOPY WITH ESOPHAGOGASTRODUODENOSCOPY (EGD)  Sept. 30, 2013   WUJ:WJXBSLF:Mild diverticulosis was noted in the sigmoid colon/The colon was otherwise normal/Small internal hemorrhoids/EGD:The mucosa of the esophagus appeared normal/Non-erosive gastritis (inflammation) was found; multiple bx/The duodenal mucosa showed no abnormalities. +H.pylori gastritis, treated with equivalent of prevpac. SAVARY DILATION  . ESOPHAGEAL DILATION N/A 10/07/2014  Procedure: ESOPHAGEAL DILATION;  Surgeon: West Bali, MD;  Location: [facility] SUITE;  Service: Endoscopy;  Laterality: N/A;  . ESOPHAGOGASTRODUODENOSCOPY N/A 10/07/2014   Dr. [name]: moderate non-erosive gastritis, no definite stricture, empiric dilation . negative H.pylori   . growth removed from intestine     [facility]-as teenager, done through colonoscopy  . PACEMAKER GENERATOR CHANGE  09/18/2006   generator change by Dr Amil Amen with a  MDT Adapta L  . PACEMAKER INSERTION  09/13/1995   for sick sinus syndome and syncope at [facility]  . PPM GENERATOR CHANGEOUT N/A 06/11/2018    Medtronic Azure XT DR MRI SureScan model [id number] (serial number [id number]) pacemaker by Dr Johney Frame  . right Achilles tendon     X 2  . TONSILLECTOMY       OB History   No obstetric history on file.      Home Medications    Prior to Admission medications   Medication Sig Start Date End Date Taking? Authorizing Provider  albuterol (PROVENTIL HFA;VENTOLIN HFA) 108 (90 BASE) MCG/ACT inhaler Inhale 2 puffs into the lungs every 6 (six) hours as needed for wheezing or shortness of breath.    [provider]  aspirin EC 81 MG tablet Take 1 tablet (81 mg total) by mouth daily. 06/22/18   Marily Lente, NP  cetirizine (ZYRTEC) 10 MG tablet Take 10 mg by mouth at bedtime as needed for allergies.     [provider]  diclofenac (VOLTAREN) 75 MG EC tablet Take 75 mg by mouth daily as needed for mild pain.    [provider]  HYDROcodone-acetaminophen (NORCO/VICODIN) 5-325 MG tablet Take 1 tablet by mouth every 6 (six) hours as needed for pain.     [provider]  levothyroxine (SYNTHROID, LEVOTHROID) 112 MCG tablet Take 112 mcg by mouth daily before breakfast.  12/12/17   [provider]  nitroGLYCERIN (NITROSTAT) 0.4 MG SL tablet Place 1 tablet (0.4 mg total) under the tongue every 5 (five) minutes as needed for chest pain. 08/15/16   Lyn Records, MD  pantoprazole (PROTONIX) 40 MG tablet Take 1 tablet (40 mg total) by mouth 2 (two) times daily before a meal. 09/03/14   Tiffany Kocher, PA-C  potassium chloride SA (K-DUR,KLOR-CON) 20 MEQ tablet Take 20 mEq by mouth daily.     [provider]  rosuvastatin (CRESTOR) 20 MG tablet Take 20 mg by mouth daily. 08/12/16   [provider]  traMADol (ULTRAM) 50 MG tablet Take 50 mg by mouth every 4 (four) hours as needed (migraines).    [provider]  valsartan-hydrochlorothiazide (DIOVAN-HCT) 320-25 MG per tablet Take 1 tablet by mouth daily.      [provider]    Family History Family History  Problem Relation Age of Onset  . Hyperlipidemia Mother   . Hypertension Mother   . Heart disease Father   . Hypertension Father   . Hyperlipidemia Sister   . Hypertension Sister   . Colon polyps Sister   . Hyperlipidemia Brother   . Hypertension Brother   . Colon polyps Brother   . Colon cancer Neg Hx     Social History Social History   Tobacco Use  . Smoking status: Former Smoker    Packs/day: 0.25    Years: 4.00    Pack years: 1.00    Quit date: 10/09/1985    Years since quitting: 33.5  . Smokeless tobacco: Never Used  . Tobacco comment: just  in teens  Substance Use Topics  . Alcohol use: No    Alcohol/week: 0.0 standard drinks  . Drug use: No     Allergies   Gabapentin   Review of Systems Review of Systems  Constitutional: Negative for activity change and appetite change.  HENT: Negative for congestion, ear discharge, ear pain, facial swelling, nosebleeds, rhinorrhea, sneezing and tinnitus.   Eyes: Negative for photophobia, pain and discharge.  Respiratory: Positive for cough. Negative for choking, shortness of breath and wheezing.   Cardiovascular: Negative for chest pain, palpitations and leg swelling.  Gastrointestinal: Negative for abdominal pain, blood in stool, constipation, diarrhea, nausea and vomiting.  Genitourinary: Negative for difficulty urinating, dysuria, flank pain, frequency and hematuria.  Musculoskeletal: Positive for back pain. Negative for gait problem, myalgias and neck pain.  Skin: Negative for color change, rash and wound.  Neurological: Negative for dizziness, seizures, syncope, facial asymmetry, speech difficulty, weakness and numbness.  Hematological: Negative for adenopathy. Does not bruise/bleed easily.  Psychiatric/Behavioral: Negative for agitation,  confusion, hallucinations, self-injury and suicidal ideas. The patient is not nervous/anxious.      Physical Exam Updated Vital Signs Ht 5\' 4"  (1.626 m)   Wt 89.4 kg   BMI 33.81 kg/m   Physical Exam Vitals signs and nursing note reviewed.  Constitutional:      Appearance: She is well-developed. She is not toxic-appearing.  HENT:     Head: Normocephalic.     Right Ear: Tympanic membrane and external ear normal.     Left Ear: Tympanic membrane and external ear normal.  Eyes:     General: Lids are normal.     Pupils: Pupils are equal, round, and reactive to light.  Neck:     Musculoskeletal: Normal range of motion and neck supple.     Vascular: No carotid bruit.  Cardiovascular:     Rate and Rhythm: Normal rate and regular rhythm.     Pulses: Normal pulses.     Heart sounds: Normal heart sounds.  Pulmonary:     Effort: No respiratory distress.     Breath sounds: Normal breath sounds.     Comments: Lungs are clear.  There is symmetrical rise and fall of the chest.  Patient speaks in complete sentences without problem. Abdominal:     General: Bowel sounds are normal.     Palpations: Abdomen is soft.     Tenderness: There is no abdominal tenderness. There is no guarding.  Musculoskeletal: Normal range of motion.     Lumbar back: She exhibits tenderness and spasm.     Comments: Spasm and pain with attempted range of motion of the lumbar spine.  There is no palpable step-off of the cervical, thoracic, or lumbar spine.  There are no hot areas appreciated.  Lymphadenopathy:     Head:     Right side of head: No submandibular adenopathy.     Left side of head: No submandibular adenopathy.     Cervical: No cervical adenopathy.  Skin:    General: Skin is warm and dry.  Neurological:     Mental Status: She is alert and oriented to person, place, and time.     Cranial Nerves: No cranial nerve deficit.     Sensory: No sensory deficit.  Psychiatric:        Speech: Speech normal.       ED Treatments / Results  Labs (all labs ordered are listed, but only abnormal results are displayed) Labs Reviewed - No data to display  EKG None  Radiology No results found.  Procedures Procedures (including critical care time)  Medications Ordered in ED Medications - No data to display   Initial Impression / Assessment and Plan / ED Course  I have reviewed the triage vital signs and the nursing notes.  Pertinent labs & imaging results that were available during my care of the patient were reviewed by me and considered in my medical decision making (see chart for details).          Final Clinical Impressions(s) / ED Diagnoses MDM  Patient has had a dry cough for the past 3 to 4 days.  She now has some right lower back pain.  There are no hot areas appreciated.  No reported urinary symptoms.  No neurologic deficits on examination.  No nausea or vomiting reported.  Patient will be treated for muscle strain of the lower back.  Prescription for Flexeril given to the patient.  Patient will use Tylenol extra strength with breakfast, lunch, dinner, and at bedtime.  Patient will use Ultram for pain at bedtime if needed.   Final diagnoses:  Strain of lumbar region, initial encounter    ED Discharge Orders         Ordered    traMADol (ULTRAM) 50 MG tablet     04/07/19 1141    cyclobenzaprine (FLEXERIL) 10 MG tablet  3 times daily     04/07/19 1141           Lily Kocher, PA-C 04/07/19 1147    Lily Kocher, PA-C 04/07/19 1214    Long, Wonda [facility], MD 04/07/19 1856

## 2019-04-07 NOTE — ED Triage Notes (Addendum)
Pt reports has had a dry cough that started Thursday.  Reports went to her pcp Friday and was given a steroid shot.  Pt says she thinks she pulled something in her lower back while coughing.  Denies any fever, sob.   Pt says cough has improved.

## 2019-04-07 NOTE — ED Notes (Signed)
HB in to assess 

## 2019-04-07 NOTE — ED Notes (Signed)
Pt remarks her pharmacy is not open on Sundays - request a hard copy prescription  HB notified

## 2019-04-15 ENCOUNTER — Telehealth: Payer: Self-pay | Admitting: Interventional Cardiology

## 2019-04-15 ENCOUNTER — Telehealth: Payer: Self-pay | Admitting: Internal Medicine

## 2019-04-15 NOTE — Telephone Encounter (Signed)
Patient called to let us know that she tested positive for  Covid Apr 11, 2019

## 2019-04-15 NOTE — Telephone Encounter (Signed)
New Message  Patient called and stated that she took Covid-19 test and came back positive. Wanted to let Dr. Tamala Julian and staff aware  Please call

## 2019-04-15 NOTE — Telephone Encounter (Signed)
Noted and looks like pt has already contacted [place] office as well today

## 2019-06-12 ENCOUNTER — Ambulatory Visit (INDEPENDENT_AMBULATORY_CARE_PROVIDER_SITE_OTHER): Payer: Medicare Other | Admitting: *Deleted

## 2019-06-12 DIAGNOSIS — I495 Sick sinus syndrome: Secondary | ICD-10-CM | POA: Diagnosis not present

## 2019-06-12 LAB — CUP PACEART REMOTE DEVICE CHECK
Battery Remaining Longevity: 161 mo
Battery Voltage: 3.07 V
Brady Statistic AP VP Percent: 0.03 %
Brady Statistic AP VS Percent: 70.13 %
Brady Statistic AS VP Percent: 0.02 %
Brady Statistic AS VS Percent: 29.82 %
Brady Statistic RA Percent Paced: 70.25 %
Brady Statistic RV Percent Paced: 0.05 %
Date Time Interrogation Session: 20210112232922
Implantable Lead Implant Date: 19970416
Implantable Lead Implant Date: 19970416
Implantable Lead Location: 753859
Implantable Lead Location: 753860
Implantable Lead Model: 5034
Implantable Lead Model: 5534
Implantable Pulse Generator Implant Date: 20200113
Lead Channel Impedance Value: 627 Ohm
Lead Channel Impedance Value: 646 Ohm
Lead Channel Impedance Value: 684 Ohm
Lead Channel Impedance Value: 722 Ohm
Lead Channel Pacing Threshold Amplitude: 0.625 V
Lead Channel Pacing Threshold Amplitude: 0.75 V
Lead Channel Pacing Threshold Pulse Width: 0.4 ms
Lead Channel Pacing Threshold Pulse Width: 0.4 ms
Lead Channel Sensing Intrinsic Amplitude: 19.5 mV
Lead Channel Sensing Intrinsic Amplitude: 19.5 mV
Lead Channel Sensing Intrinsic Amplitude: 2 mV
Lead Channel Sensing Intrinsic Amplitude: 2 mV
Lead Channel Setting Pacing Amplitude: 1.5 V
Lead Channel Setting Pacing Amplitude: 2.5 V
Lead Channel Setting Pacing Pulse Width: 0.4 ms
Lead Channel Setting Sensing Sensitivity: 2 mV

## 2019-07-09 DIAGNOSIS — H35463 Secondary vitreoretinal degeneration, bilateral: Secondary | ICD-10-CM | POA: Diagnosis not present

## 2019-07-10 NOTE — Progress Notes (Addendum)
[facility] Note  07/15/2019     CHIEF COMPLAINT Patient presents for Retina Evaluation   HISTORY OF PRESENT ILLNESS: Sylvia Anthony is a 64 y.o. female who presents to the clinic today for:   HPI    Retina Evaluation    In both eyes.  Duration of 1 week.  Associated Symptoms Negative for Weight Loss, Shoulder/Hip pain, Scalp Tenderness, Glare, Redness, Blind Spot, Floaters, Fatigue, Fever, Jaw Claudication, Trauma, Photophobia, Pain, Distortion and Flashes.  Context:  distance vision, near vision and mid-range vision.  Treatments tried include eye drops.  I, the attending physician,  performed the HPI with the patient and updated documentation appropriately.          Comments    Pt states she saw [name] in October for routine eye exam and states she was told she has cataracts, but they are not ready for sx yet, she states she went back to [name] on Tuesday bc she feels like she has a "film" over both eyes, she states [name] told her she has a lot of inflammation in her eyes, she was given PF to use until her appt here, but states they have not helped, she denies eye pain       Last edited by [name], MD on 07/15/2019 10:02 AM. (History)    pt states in January she started seeing floaters "every now and then", but then she noticed a lot of floaters in both eyes, she states it now looks like she has a film over both eyes, she states she has fibromyalgia which effects mostly her back, she states she saw a neurologist in [place], but does not see him any more, she states she was given hydrocodone for the pain, but does not take it and just tries to deal with the pain, she states she has 2 nieces with lupus also, she states [name] put her on PF every hr for 3 days and since then she's been using it every 2 hrs, she states she had covid and was on oral prednisone, she states she has light sensitivity also  Referring physician: Leticia Clas, [place] [place],  [place] [address]  HISTORICAL INFORMATION:   Selected notes from the MEDICAL RECORD NUMBER Referred by [name] for concern of bilateral vitreitis.   CURRENT MEDICATIONS: Current Outpatient Medications (Ophthalmic Drugs)  Medication Sig  . Bromfenac Sodium (PROLENSA) 0.07 % SOLN Place 1 drop into both eyes 4 (four) times daily.  . prednisoLONE acetate (PRED FORTE) 1 % ophthalmic suspension INSTILL ONE DROP IN EACH EYE EVERY HOUR WHILE AWAKE FOR 3 DAYS THEN EVERY OTHER HOUR WHILE AWAKE UNTIL NEXT APPOINTMENT.   No current facility-administered medications for this visit. (Ophthalmic Drugs)   Current Outpatient Medications (Other)  Medication Sig  . albuterol (PROVENTIL HFA;VENTOLIN HFA) 108 (90 BASE) MCG/ACT inhaler Inhale 2 puffs into the lungs every 6 (six) hours as needed for wheezing or shortness of breath.  Marland Kitchen aspirin EC 81 MG tablet Take 1 tablet (81 mg total) by mouth daily.  . cetirizine (ZYRTEC) 10 MG tablet Take 10 mg by mouth at bedtime as needed for allergies.   . cyclobenzaprine (FLEXERIL) 10 MG tablet Take 1 tablet (10 mg total) by mouth 3 (three) times daily.  . diclofenac (VOLTAREN) 75 MG EC tablet Take 75 mg by mouth daily as needed for mild pain.  Marland Kitchen HYDROcodone-acetaminophen (NORCO/VICODIN) 5-325 MG tablet  Take 1 tablet by mouth every 6 (six) hours as needed for pain.   Marland Kitchen levothyroxine (SYNTHROID, LEVOTHROID) 112 MCG tablet Take 112 mcg by mouth daily before breakfast.   . metoprolol succinate (TOPROL-XL) 50 MG 24 hr tablet Take 50 mg by mouth daily.  . nitroGLYCERIN (NITROSTAT) 0.4 MG SL tablet Place 1 tablet (0.4 mg total) under the tongue every 5 (five) minutes as needed for chest pain.  . pantoprazole (PROTONIX) 40 MG tablet Take 1 tablet (40 mg total) by mouth 2 (two) times daily before a meal.  . potassium chloride SA (K-DUR,KLOR-CON) 20 MEQ tablet Take 20 mEq by mouth daily.   . rosuvastatin (CRESTOR) 20 MG tablet Take 20 mg by  mouth daily.  . traMADol (ULTRAM) 50 MG tablet 1 tab at HS, or q6h prn severe pain.  . valsartan-hydrochlorothiazide (DIOVAN-HCT) 320-25 MG per tablet Take 1 tablet by mouth daily.     No current facility-administered medications for this visit. (Other)      REVIEW OF SYSTEMS: ROS    Positive for: Musculoskeletal, Cardiovascular, Eyes   Negative for: Constitutional, Gastrointestinal, Neurological, Skin, Genitourinary, HENT, Endocrine, Respiratory, Psychiatric, Allergic/Imm, Heme/Lymph   Last edited by [name], MD on 07/15/2019  1:02 PM. (History)       ALLERGIES Allergies  Allergen Reactions  . Gabapentin     SWELLING "OUT OF IT" 12/23/16    PAST MEDICAL HISTORY Past Medical History:  Diagnosis Date  . Anemia   . Arm numbness   . 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 Carotid bruit   . CHF (congestive heart failure) ([place]) 1997   pacemaker  . Fibromyalgia   . GERD (gastroesophageal reflux disease)   . Glucose intolerance (impaired glucose tolerance)   . H. pylori infection Sept 2013   Amoxicillin, Biaxin  . Hyperlipidemia   . Hypertension   . Hypothyroidism   . Mitral valve prolapse   . Peripheral vascular disease ([place])   . Rheumatic fever    age 33, was [facility] for 17 weeks  . SSS (sick sinus syndrome) [facility])    Past Surgical History:  Procedure Laterality Date  . ABDOMINAL HYSTERECTOMY     partial  . APPENDECTOMY    . CARDIAC CATHETERIZATION     multiple  . CHOLECYSTECTOMY    . COLONOSCOPY  08/01/2006   3 mm descending colon polyp removed/8 mm sessile ascending colon polyp removed / 3-mm rectal  polyp removed /Rare sigmoid diverticulosis/ Moderate internal hemorrhoids.Advanced adenoma on colonoscopy in March 2008.  The polyp was anadenomatous polyp with a foci of high-grade dysplasia  . COLONOSCOPY  09/08/2008   SIMPLE ADENOMA/HYPERPLASTIC POLY/Multiple colon polyps (ascending, sigmoid, rectal)  Mild sigmoid colon diverticulosis./ Small  internal hemorrhoids  . COLONOSCOPY N/A 12/11/2017   Dr. Oneida Alar: diverticulosis, hemorrhoids next surveillance tcs 5 years.   . COLONOSCOPY WITH ESOPHAGOGASTRODUODENOSCOPY (EGD)  Sept. 30, 2013   [facility] diverticulosis was noted in the sigmoid colon/The colon was otherwise normal/Small internal hemorrhoids/EGD:The mucosa of the esophagus appeared normal/Non-erosive gastritis (inflammation) was found; multiple bx/The duodenal mucosa showed no abnormalities. +H.pylori gastritis, treated with equivalent of prevpac. SAVARY DILATION  . ESOPHAGEAL DILATION N/A 10/07/2014   Procedure: ESOPHAGEAL DILATION;  Surgeon: Danie Binder, MD;  Location: [facility];  Service: Endoscopy;  Laterality: N/A;  . ESOPHAGOGASTRODUODENOSCOPY N/A 10/07/2014   Dr. Oneida Alar: moderate non-erosive gastritis, no definite stricture, empiric dilation . negative H.pylori   . growth removed from intestine     [facility]-as teenager,  done through colonoscopy  . PACEMAKER GENERATOR CHANGE  09/18/2006   generator change by Dr Leonia Reeves with a MDT Adapta L  . PACEMAKER INSERTION  09/13/1995   for sick sinus syndome and syncope at [facility]  . PPM GENERATOR CHANGEOUT N/A 06/11/2018    Medtronic Azure XT DR MRI SureScan model [id number] (serial number [id number]) pacemaker by Dr Rayann Heman  . right Achilles tendon     X 2  . TONSILLECTOMY      FAMILY HISTORY Family History  Problem Relation Age of Onset  . Hyperlipidemia Mother   . Hypertension Mother   . Heart disease Father   . Hypertension Father   . Hyperlipidemia Sister   . Hypertension Sister   . Colon polyps Sister   . Hyperlipidemia Brother   . Hypertension Brother   . Colon polyps Brother   . Colon cancer Neg Hx     SOCIAL HISTORY Social History   Tobacco Use  . Smoking status: Former Smoker    Packs/day: 0.25    Years: 4.00    Pack years: 1.00    Quit date: 10/09/1985    Years since quitting: 33.7  . Smokeless tobacco: Never Used  . Tobacco comment: just  in teens  Substance Use Topics  . Alcohol use: No    Alcohol/week: 0.0 standard drinks  . Drug use: No         OPHTHALMIC EXAM:  Base Eye Exam    Visual Acuity (Snellen - Linear)      Right Left   Dist [place] 20/70 -1 20/80 -2   Dist ph [place] NI NI       Tonometry (Tonopen, 9:06 AM)      Right Left   Pressure 20 16       Pupils      Dark Light Shape React APD   Right 4 3 Round Brisk None   Left 4 3 Round Brisk None       Visual Fields (Counting fingers)      Left Right    Full Full       Extraocular Movement      Right Left    Full, Ortho Full, Ortho       Neuro/Psych    Oriented x3: Yes   Mood/Affect: Normal       Dilation    Both eyes: 1.0% Mydriacyl, 2.5% Phenylephrine @ 9:06 AM        Slit Lamp and Fundus Exam    Slit Lamp Exam      Right Left   Lids/Lashes Mild Dermatochalasis - upper lid Mild Dermatochalasis - upper lid   Conjunctiva/Sclera Nasal Pinguecula, mild Melanosis, white and quiet Nasal Pinguecula, mild Melanosis, white and quiet   Cornea Mild Arcus, 1-2+ inferior Punctate epithelial erosions Mild Arcus, 2+ inferior Punctate epithelial erosions   Anterior Chamber deep, narrow temporal angle, 2+cell/pigment deep, narrow temporal angle, 2+cell/pigment   Iris Round and dilated Round and dilated   Lens 2+ Nuclear sclerosis, 2+ Cortical cataract, trace pigment on anterior capsule 2+ Nuclear sclerosis, 2+ Cortical cataract   Vitreous Vitreous syneresis, 2+cell/pigment, trace to 1+ vit haze, no snowballs Vitreous syneresis, 2+cell/pigment, trace vit haze, no snowballs       Fundus Exam      Right Left   Disc Pink and Sharp Pink and Sharp   C/D Ratio 0.5 0.4   Macula Blunted foveal reflex, central CME, No heme  Blunted foveal reflex, central CME, mild Retinal pigment epithelial  mottling, no heme   Vessels Vascular attenuation, Tortuous, mild AV crossing changes Vascular attenuation, Tortuous, mild AV crossing changes   Periphery Attached, no heme,  no snowbanking  Attached, no heme, no snowbanking          IMAGING AND PROCEDURES  Imaging and Procedures for _0 @  OCT, Retina - OU - Both Eyes       Right Eye Quality was good. Central Foveal Thickness: 733. Progression has no prior data. Findings include abnormal foveal contour, intraretinal fluid, subretinal fluid (Central CME, mild vitreous opacities).   Left Eye Quality was good. Central Foveal Thickness: 572. Progression has no prior data. Findings include abnormal foveal contour, intraretinal fluid, subretinal fluid (Mild vitreous opacities).   Notes *Images captured and stored on drive  Diagnosis / Impression:  Abnormal foveal contour, +IRF, +SRF OU -- Central CME Mild vitreous opacities OU  Clinical management:  See below  Abbreviations: NFP - Normal foveal profile. CME - cystoid macular edema. PED - pigment epithelial detachment. IRF - intraretinal fluid. SRF - subretinal fluid. EZ - ellipsoid zone. ERM - epiretinal membrane. ORA - outer retinal atrophy. ORT - outer retinal tubulation. SRHM - subretinal hyper-reflective material        Fluorescein Angiography Optos (Transit OS)       Right Eye   Progression has no prior data. Early phase findings include staining, leakage. Mid/Late phase findings include staining, leakage ([place] central hyper florescence; hyper florescence of the disc; focal patches of mild hyper florescence).   Left Eye   Progression has no prior data. Early phase findings include staining, leakage. Mid/Late phase findings include staining, leakage (Petaloid central hyper florescence; hyper florescence of the disc; focal patches of mild hyper florescence).   Notes **Images stored on drive**  Impression: CME OU - petaloid central hyperfluorescence + hyperfluorescence of disc OU - mild focal patches of hyperfluorescent leakage / staining OU                  ASSESSMENT/PLAN:    ICD-10-CM   1. Cystoid macular edema of  both eyes  H35.353   2. 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 Uveitis  H20.9 Rheumatoid factor    CBC    Comprehensive metabolic panel    RPR    ANA    C-reactive protein    Sedimentation rate    Angiotensin converting enzyme    Antineutrophil Cytoplasmic Ab    Toxoplasma antibodies- IgG and  IgM    QuantiFERON-TB Gold Plus    HLA A,B,C (IR)    DG Chest 2 View    HIV antibody (with reflex)    Lyme Ab/Western Blot Reflex    Lysozyme, serum    HSV and VZV PCR Panel    HSV Type I/II IgG, IgMw/ reflex    Varicella-zoster by PCR  5. Essential hypertension  I10   6. Hypertensive retinopathy of both eyes  H35.033 Fluorescein Angiography Optos (Transit OS)  7. Combined forms of age-related cataract of both eyes  H25.813     1-4. Mild Panuveitis w/ CME OU  - pt reports 1+ mo history of floaters and decreased vision OU; +photophobia  - saw [name] last week, who noted AC cell and started PF q1h  - exam here today shows +cell/pigment in [facility] and vitreous cavity; mild vitreous haze and +central CME OU  - OCT confirms CME and vitreous opacities  -  FA shows central petaloid hyperfluorescence and hyperfluorescence of the optic disc  - pt reports history of fibromyalgia, family history of lupus  - discussed findings  - recommend continuing PF q1h OU and will add Prolensa QID OU  - will initiate uveitis lab work up:   CBC, CMP   RPR, VDRL, FTA-Abs, MHA   HIV, Lyme, Quant-Gold   Toxoplasma titers   HLA Panel   ANA   ANCA   ACE, Lysozyme   RF   ESR, CRP   CXR  - f/u 1 week -- DFE/OCT/review labs  5,6. Hypertensive retinopathy OU  - discussed importance of tight BP control  - monitor  7. Mixed form age related cataract OU  - The symptoms of cataract, surgical options, and treatments and risks were discussed with patient.  - discussed diagnosis and progression  - not yet visually significant  - monitor for now   Ophthalmic  Meds Ordered this visit:  Meds ordered this encounter  Medications  . Bromfenac Sodium (PROLENSA) 0.07 % SOLN    Sig: Place 1 drop into both eyes 4 (four) times daily.    Dispense:  6 mL    Refill:  3       Return in about 1 week (around 07/22/2019) for f/u CME OU, DFE, OCT.  There are no Patient Instructions on file for this visit.   Explained the diagnoses, plan, and follow up with the patient and they expressed understanding.  Patient expressed understanding of the importance of proper follow up care.   Gardiner Sleeper, M.D., Ph.D. Diseases & Surgery of the Retina and Vitreous [facility]  I have reviewed the above documentation for accuracy and completeness, and I agree with the above. Gardiner Sleeper, M.D., Ph.D. 07/15/19 2:11 PM   Abbreviations: M myopia (nearsighted); A astigmatism; H hyperopia (farsighted); P presbyopia; Mrx spectacle prescription;  CTL contact lenses; OD right eye; OS left eye; OU both eyes  XT exotropia; ET esotropia; PEK punctate epithelial keratitis; PEE punctate epithelial erosions; DES dry eye syndrome; MGD meibomian gland dysfunction; ATs artificial tears; PFAT's preservative free artificial tears; [place] nuclear sclerotic cataract; PSC posterior subcapsular cataract; ERM epi-retinal membrane; PVD posterior vitreous detachment; RD retinal detachment; DM diabetes mellitus; DR diabetic retinopathy; NPDR non-proliferative diabetic retinopathy; PDR proliferative diabetic retinopathy; CSME clinically significant macular edema; DME diabetic macular edema; dbh dot blot hemorrhages; CWS cotton wool spot; POAG primary open angle glaucoma; C/D cup-to-disc ratio; HVF humphrey visual field; GVF goldmann visual field; OCT optical coherence tomography; IOP intraocular pressure; BRVO Branch retinal vein occlusion; CRVO central retinal vein occlusion; CRAO central retinal artery occlusion; BRAO branch retinal artery occlusion; RT retinal tear; SB scleral  buckle; PPV pars plana vitrectomy; VH Vitreous hemorrhage; PRP panretinal laser photocoagulation; IVK intravitreal kenalog; VMT vitreomacular traction; MH Macular hole;  NVD neovascularization of the disc; NVE neovascularization elsewhere; AREDS age related eye disease study; ARMD age related macular degeneration; POAG primary open angle glaucoma; EBMD epithelial/anterior basement membrane dystrophy; ACIOL anterior chamber intraocular lens; IOL intraocular lens; PCIOL posterior chamber intraocular lens; Phaco/IOL phacoemulsification with intraocular lens placement; [place] photorefractive keratectomy; LASIK laser assisted in situ keratomileusis; HTN hypertension; DM diabetes mellitus; COPD chronic obstructive pulmonary disease

## 2019-07-15 ENCOUNTER — Encounter (INDEPENDENT_AMBULATORY_CARE_PROVIDER_SITE_OTHER): Payer: Self-pay | Admitting: Ophthalmology

## 2019-07-15 ENCOUNTER — Other Ambulatory Visit: Payer: Self-pay

## 2019-07-15 ENCOUNTER — Ambulatory Visit (HOSPITAL_COMMUNITY)
Admission: RE | Admit: 2019-07-15 | Discharge: 2019-07-15 | Disposition: A | Discharge status: Home / Self Care | Payer: Medicare Other | Source: Ambulatory Visit | Attending: Ophthalmology | Admitting: Ophthalmology

## 2019-07-15 ENCOUNTER — Ambulatory Visit (INDEPENDENT_AMBULATORY_CARE_PROVIDER_SITE_OTHER): Payer: Medicare Other | Admitting: Ophthalmology

## 2019-07-15 DIAGNOSIS — H3581 Retinal edema: Secondary | ICD-10-CM | POA: Diagnosis not present

## 2019-07-15 DIAGNOSIS — H25813 Combined forms of age-related cataract, bilateral: Secondary | ICD-10-CM

## 2019-07-15 DIAGNOSIS — H209 Unspecified iridocyclitis: Secondary | ICD-10-CM | POA: Insufficient documentation

## 2019-07-15 DIAGNOSIS — H35033 Hypertensive retinopathy, bilateral: Secondary | ICD-10-CM

## 2019-07-15 DIAGNOSIS — H44113 Panuveitis, bilateral: Secondary | ICD-10-CM

## 2019-07-15 DIAGNOSIS — R918 Other nonspecific abnormal finding of lung field: Secondary | ICD-10-CM | POA: Diagnosis not present

## 2019-07-15 DIAGNOSIS — I10 Essential (primary) hypertension: Secondary | ICD-10-CM

## 2019-07-15 DIAGNOSIS — H35353 Cystoid macular degeneration, bilateral: Secondary | ICD-10-CM | POA: Diagnosis not present

## 2019-07-15 MED ORDER — PROLENSA 0.07 % OP SOLN: 1.0000 [drp] | Freq: Four times a day (QID) | OPHTHALMIC | 3 refills | Status: DC

## 2019-07-15 NOTE — Addendum Note (Signed)
Addended by: Karie Chimera on: 07/15/2019 02:53 PM   Modules accepted: Orders

## 2019-07-17 LAB — LYSOZYME, SERUM: Lysozyme: 5.6 ug/mL (ref 2.5–12.9)

## 2019-07-17 LAB — LYME AB/WESTERN BLOT REFLEX
LYME DISEASE AB, QUANT, IGM: <0.8 index (ref 0.00–0.79)
Lyme IgG/IgM Ab: <0.91 {ISR} (ref 0.00–0.90)

## 2019-07-17 LAB — HIV ANTIBODY (ROUTINE TESTING W REFLEX): HIV Screen 4th Generation wRfx: NONREACTIVE

## 2019-07-19 NOTE — Progress Notes (Signed)
[facility] Note  07/22/2019     CHIEF COMPLAINT Patient presents for Retina Follow Up   HISTORY OF PRESENT ILLNESS: Sylvia Anthony is a 64 y.o. female who presents to the clinic today for:   HPI    Retina Follow Up    Patient presents with  Other.  In both eyes.  This started 1 month ago.  Since onset it is gradually improving.  I, the attending physician,  performed the HPI with the patient and updated documentation appropriately.          Comments    1 wk F/U CME ou. Patient states she feels her vision has improved. Pt is using Prolensa Qid ou.       Last edited by [name], MD on 07/22/2019  9:30 AM. (History)    pt states she can tell a small difference in her vision, she has been using PF q1h and Prolensa qid as directed   Referring physician: Glenda Chroman, MD [place],   [id number]  HISTORICAL INFORMATION:   Selected notes from the MEDICAL RECORD NUMBER Referred by [name] for concern of bilateral vitreitis.   CURRENT MEDICATIONS: Current Outpatient Medications (Ophthalmic Drugs)  Medication Sig  . Bromfenac Sodium (PROLENSA) 0.07 % SOLN Place 1 drop into both eyes 4 (four) times daily.  . prednisoLONE acetate (PRED FORTE) 1 % ophthalmic suspension INSTILL ONE DROP IN EACH EYE EVERY HOUR WHILE AWAKE FOR 3 DAYS THEN EVERY OTHER HOUR WHILE AWAKE UNTIL NEXT APPOINTMENT.   No current facility-administered medications for this visit. (Ophthalmic Drugs)   Current Outpatient Medications (Other)  Medication Sig  . albuterol (PROVENTIL HFA;VENTOLIN HFA) 108 (90 BASE) MCG/ACT inhaler Inhale 2 puffs into the lungs every 6 (six) hours as needed for wheezing or shortness of breath.  Marland Kitchen aspirin EC 81 MG tablet Take 1 tablet (81 mg total) by mouth daily.  . cetirizine (ZYRTEC) 10 MG tablet Take 10 mg by mouth at bedtime as needed for allergies.   . cyclobenzaprine (FLEXERIL) 10 MG tablet Take 1 tablet (10 mg total) by mouth 3  (three) times daily.  . diclofenac (VOLTAREN) 75 MG EC tablet Take 75 mg by mouth daily as needed for mild pain.  Marland Kitchen HYDROcodone-acetaminophen (NORCO/VICODIN) 5-325 MG tablet Take 1 tablet by mouth every 6 (six) hours as needed for pain.   Marland Kitchen levothyroxine (SYNTHROID, LEVOTHROID) 112 MCG tablet Take 112 mcg by mouth daily before breakfast.   . metoprolol succinate (TOPROL-XL) 50 MG 24 hr tablet Take 50 mg by mouth daily.  . nitroGLYCERIN (NITROSTAT) 0.4 MG SL tablet Place 1 tablet (0.4 mg total) under the tongue every 5 (five) minutes as needed for chest pain.  . pantoprazole (PROTONIX) 40 MG tablet Take 1 tablet (40 mg total) by mouth 2 (two) times daily before a meal.  . potassium chloride SA (K-DUR,KLOR-CON) 20 MEQ tablet Take 20 mEq by mouth daily.   . rosuvastatin (CRESTOR) 20 MG tablet Take 20 mg by mouth daily.  . traMADol (ULTRAM) 50 MG tablet 1 tab at HS, or q6h prn severe pain.  . valsartan-hydrochlorothiazide (DIOVAN-HCT) 320-25 MG per tablet Take 1 tablet by mouth daily.    Marland Kitchen sulfamethoxazole-trimethoprim (BACTRIM DS) 800-160 MG tablet Take 1 tablet by mouth 2 (two) times daily.   No current facility-administered medications for this visit. (Other)      REVIEW OF SYSTEMS: ROS    Positive for: Eyes   Negative for: Constitutional,  Gastrointestinal, Neurological, Skin, Genitourinary, Musculoskeletal, HENT, Endocrine, Cardiovascular, Respiratory, Psychiatric, Allergic/Imm, Heme/Lymph   Last edited by Zenovia Jordan, LPN on 8/54/6270  3:50 AM. (History)       ALLERGIES Allergies  Allergen Reactions  . Gabapentin     SWELLING "OUT OF IT" 12/23/16    PAST MEDICAL HISTORY Past Medical History:  Diagnosis Date  . Anemia   . Arm numbness   . 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 Carotid bruit   . CHF (congestive heart failure) ([place]) 1997   pacemaker  . Fibromyalgia   . GERD (gastroesophageal reflux disease)   . Glucose intolerance (impaired glucose tolerance)   .  H. pylori infection Sept 2013   Amoxicillin, Biaxin  . Hyperlipidemia   . Hypertension   . Hypothyroidism   . Mitral valve prolapse   . Peripheral vascular disease ([place])   . Rheumatic fever    age 35, was at [facility] for 17 weeks  . SSS (sick sinus syndrome) [facility])    Past Surgical History:  Procedure Laterality Date  . ABDOMINAL HYSTERECTOMY     partial  . APPENDECTOMY    . CARDIAC CATHETERIZATION     multiple  . CHOLECYSTECTOMY    . COLONOSCOPY  08/01/2006   3 mm descending colon polyp removed/8 mm sessile ascending colon polyp removed / 3-mm rectal  polyp removed /Rare sigmoid diverticulosis/ Moderate internal hemorrhoids.Advanced adenoma on colonoscopy in March 2008.  The polyp was anadenomatous polyp with a foci of high-grade dysplasia  . COLONOSCOPY  09/08/2008   SIMPLE ADENOMA/HYPERPLASTIC POLY/Multiple colon polyps (ascending, sigmoid, rectal)  Mild sigmoid colon diverticulosis./ Small internal hemorrhoids  . COLONOSCOPY N/A 12/11/2017   Dr. Oneida Alar: diverticulosis, hemorrhoids next surveillance tcs 5 years.   . COLONOSCOPY WITH ESOPHAGOGASTRODUODENOSCOPY (EGD)  Sept. 30, 2013   KXF:GHWE diverticulosis was noted in the sigmoid colon/The colon was otherwise normal/Small internal hemorrhoids/EGD:The mucosa of the esophagus appeared normal/Non-erosive gastritis (inflammation) was found; multiple bx/The duodenal mucosa showed no abnormalities. +H.pylori gastritis, treated with equivalent of prevpac. SAVARY DILATION  . ESOPHAGEAL DILATION N/A 10/07/2014   Procedure: ESOPHAGEAL DILATION;  Surgeon: Danie Binder, MD;  Location: [facility];  Service: Endoscopy;  Laterality: N/A;  . ESOPHAGOGASTRODUODENOSCOPY N/A 10/07/2014   Dr. Oneida Alar: moderate non-erosive gastritis, no definite stricture, empiric dilation . negative H.pylori   . growth removed from intestine     [facility]-as teenager, done through colonoscopy  . PACEMAKER GENERATOR CHANGE  09/18/2006   generator change by Dr Leonia Reeves  with a MDT Adapta L  . PACEMAKER INSERTION  09/13/1995   for sick sinus syndome and syncope at [facility]  . PPM GENERATOR CHANGEOUT N/A 06/11/2018    Medtronic Azure XT DR MRI SureScan model [id number] (serial number [id number]) pacemaker by Dr Rayann Heman  . right Achilles tendon     X 2  . TONSILLECTOMY      FAMILY HISTORY Family History  Problem Relation Age of Onset  . Hyperlipidemia Mother   . Hypertension Mother   . Heart disease Father   . Hypertension Father   . Hyperlipidemia Sister   . Hypertension Sister   . Colon polyps Sister   . Hyperlipidemia Brother   . Hypertension Brother   . Colon polyps Brother   . Colon cancer Neg Hx     SOCIAL HISTORY Social History   Tobacco Use  . Smoking status: Former Smoker    Packs/day: 0.25    Years: 4.00    Pack years:  1.00    Quit date: 10/09/1985    Years since quitting: 33.8  . Smokeless tobacco: Never Used  . Tobacco comment: just in teens  Substance Use Topics  . Alcohol use: No    Alcohol/week: 0.0 standard drinks  . Drug use: No         OPHTHALMIC EXAM:  Base Eye Exam    Visual Acuity (Snellen - Linear)      Right Left   Dist [place] 20/70 +2 20/60 -2   Dist ph [place] NI NI       Tonometry (Tonopen, 8:46 AM)      Right Left   Pressure 17 10       Pupils      Dark Light Shape React APD   Right 3 2 Round Brisk None   Left 3 2 Round Brisk None       Visual Fields (Counting fingers)      Left Right    Full Full       Extraocular Movement      Right Left    Full, Ortho Full, Ortho       Neuro/Psych    Oriented x3: Yes   Mood/Affect: Normal       Dilation    Both eyes: 1.0% Mydriacyl, 2.5% Phenylephrine @ 8:46 AM        Slit Lamp and Fundus Exam    Slit Lamp Exam      Right Left   Lids/Lashes Mild Dermatochalasis - upper lid Mild Dermatochalasis - upper lid   Conjunctiva/Sclera Nasal Pinguecula, mild Melanosis, white and quiet Nasal Pinguecula, mild Melanosis, white and quiet   Cornea  Mild Arcus, 1-2+ inferior Punctate epithelial erosions Mild Arcus, 2+ inferior Punctate epithelial erosions   Anterior Chamber deep, narrow temporal angle, 1+cell/pigment deep, narrow temporal angle, 1-2+cell/pigment   Iris Round and dilated Round and dilated   Lens 2+ Nuclear sclerosis, 2+ Cortical cataract, trace pigment on anterior capsule 2+ Nuclear sclerosis, 2+ Cortical cataract   Vitreous Vitreous syneresis, 2+cell/pigment, trace to 1+ vit haze, no snowballs Vitreous syneresis, 2+cell/pigment, trace vit haze, no snowballs       Fundus Exam      Right Left   Disc Pink and Sharp Pink and Sharp   C/D Ratio 0.5 0.4   Macula Blunted foveal reflex, central CME -- slightly improved, No heme  Blunted foveal reflex, central CME -- improved, mild Retinal pigment epithelial mottling, no heme   Vessels Vascular attenuation, Tortuous, mild AV crossing changes Vascular attenuation, Tortuous, mild AV crossing changes   Periphery Attached, no heme, no snowbanking  Attached, no heme, no snowbanking          IMAGING AND PROCEDURES  Imaging and Procedures for @TODAY @  OCT, Retina - OU - Both Eyes       Right Eye Quality was good. Central Foveal Thickness: 624. Progression has improved. Findings include abnormal foveal contour, intraretinal fluid, subretinal fluid (Interval improvement in CME, mild interval improvement in vitreous opacities).   Left Eye Quality was good. Central Foveal Thickness: 397. Progression has improved. Findings include abnormal foveal contour, intraretinal fluid, subretinal fluid (Interval improvement in foveal profile, IRF, SRF).   Notes *Images captured and stored on drive  Diagnosis / Impression:  Abnormal foveal contour, +IRF, +SRF OU -- Central CME Mild vitreous opacities OU OD: Interval improvement in CME, mild interval improvement in vitreous opacities OS: Interval improvement in foveal profile, IRF, SRF  Clinical management:  See below  Abbreviations:  NFP - Normal foveal profile. CME - cystoid macular edema. PED - pigment epithelial detachment. IRF - intraretinal fluid. SRF - subretinal fluid. EZ - ellipsoid zone. ERM - epiretinal membrane. ORA - outer retinal atrophy. ORT - outer retinal tubulation. SRHM - subretinal hyper-reflective material                 ASSESSMENT/PLAN:    ICD-10-CM   1. Cystoid macular edema of both eyes  H35.353   2. 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 Essential hypertension  I10   6. Hypertensive retinopathy of both eyes  H35.033   7. Combined forms of age-related cataract of both eyes  H25.813   8. Nodule of right lung  R91.1     1-4. Mild Panuveitis w/ CME OU  - pt reports 1+ mo history of floaters and decreased vision OU; +photophobia  - saw [name] last week, who noted AC cell and started PF q1h  - exam here today shows +cell/pigment in [facility] and vitreous cavity; mild vitreous haze and +central CME OU  - OCT confirms CME and vitreous opacities  - FA shows central petaloid hyperfluorescence and hyperfluorescence of the optic disc  - pt reports history of fibromyalgia, family history of lupus  - discussed findings  - recommend continuing PF q1h OU and Prolensa QID OU  - initiated uveitis lab work up -- all WNL except +toxo IgG   CBC, CMP   RPR, VDRL, FTA-Abs, MHA   HIV, Lyme, Quant-Gold   Toxoplasma titers   HLA Panel   ANA   ANCA   ACE, Lysozyme   RF   ESR, CRP   CXR  - start po bactrim DS bid for the prophylactic treatment of Toxoplasmosis  - chest x-ray without TB or pulmonary sarcoidosis, but did reveal a1.6cm nodular mass in right lung base  - spoke to Dr. Woody Seller who requested a copy of the x-ray report -- will fax copy of report and clinic notes today  - f/u 2 week -- DFE/OCT  5,6. Hypertensive retinopathy OU  - discussed importance of tight BP control  - monitor  7. Mixed form age related cataract  OU  - The symptoms of cataract, surgical options, and treatments and risks were discussed with patient.  - discussed diagnosis and progression  - not yet visually significant  - monitor for now  8. 1.6 cm lung nodule of R lung base found on CXR  - discussed with Dr. Woody Seller as above  - CT scan recommended by Radiology   Ophthalmic Meds Ordered this visit:  Meds ordered this encounter  Medications  . sulfamethoxazole-trimethoprim (BACTRIM DS) 800-160 MG tablet    Sig: Take 1 tablet by mouth 2 (two) times daily.    Dispense:  60 tablet    Refill:  2       Return in about 2 weeks (around 08/05/2019) for f/u 2 weeks, panuveitis with CME OU, DFE, OCT.  There are no Patient Instructions on file for this visit.   Explained the diagnoses, plan, and follow up with the patient and they expressed understanding.  Patient expressed understanding of the importance of proper follow up care.  This document serves as a record of services personally performed by Gardiner Sleeper, MD, PhD. It was created on their behalf by Leeann Must, [place], a certified ophthalmic assistant. The creation of this  record is the provider's dictation and/or activities during the visit.    Electronically signed by: Leeann Must, COA @TODAY @ 5:19 PM   This document serves as a record of services personally performed by Gardiner Sleeper, MD, PhD. It was created on their behalf by Ernest Mallick, OA, an ophthalmic assistant. The creation of this record is the provider's dictation and/or activities during the visit.    Electronically signed by: Ernest Mallick, OA 02.22.2021 5:19 PM   Gardiner Sleeper, M.D., Ph.D. Diseases & Surgery of the Retina and Vitreous [facility]  I have reviewed the above documentation for accuracy and completeness, and I agree with the above. Gardiner Sleeper, M.D., Ph.D. 07/22/19 5:19 PM    Abbreviations: M myopia (nearsighted); A astigmatism; H hyperopia (farsighted); P  presbyopia; Mrx spectacle prescription;  CTL contact lenses; OD right eye; OS left eye; OU both eyes  XT exotropia; ET esotropia; PEK punctate epithelial keratitis; PEE punctate epithelial erosions; DES dry eye syndrome; MGD meibomian gland dysfunction; ATs artificial tears; PFAT's preservative free artificial tears; [place] nuclear sclerotic cataract; PSC posterior subcapsular cataract; ERM epi-retinal membrane; PVD posterior vitreous detachment; RD retinal detachment; DM diabetes mellitus; DR diabetic retinopathy; NPDR non-proliferative diabetic retinopathy; PDR proliferative diabetic retinopathy; CSME clinically significant macular edema; DME diabetic macular edema; dbh dot blot hemorrhages; CWS cotton wool spot; POAG primary open angle glaucoma; C/D cup-to-disc ratio; HVF humphrey visual field; GVF goldmann visual field; OCT optical coherence tomography; IOP intraocular pressure; BRVO Branch retinal vein occlusion; CRVO central retinal vein occlusion; CRAO central retinal artery occlusion; BRAO branch retinal artery occlusion; RT retinal tear; SB scleral buckle; PPV pars plana vitrectomy; VH Vitreous hemorrhage; PRP panretinal laser photocoagulation; IVK intravitreal kenalog; VMT vitreomacular traction; MH Macular hole;  NVD neovascularization of the disc; NVE neovascularization elsewhere; AREDS age related eye disease study; ARMD age related macular degeneration; POAG primary open angle glaucoma; EBMD epithelial/anterior basement membrane dystrophy; ACIOL anterior chamber intraocular lens; IOL intraocular lens; PCIOL posterior chamber intraocular lens; Phaco/IOL phacoemulsification with intraocular lens placement; [place] photorefractive keratectomy; LASIK laser assisted in situ keratomileusis; HTN hypertension; DM diabetes mellitus; COPD chronic obstructive pulmonary disease

## 2019-07-22 ENCOUNTER — Ambulatory Visit (INDEPENDENT_AMBULATORY_CARE_PROVIDER_SITE_OTHER): Payer: Medicare Other | Admitting: Ophthalmology

## 2019-07-22 ENCOUNTER — Encounter (INDEPENDENT_AMBULATORY_CARE_PROVIDER_SITE_OTHER): Payer: Self-pay | Admitting: Ophthalmology

## 2019-07-22 DIAGNOSIS — I10 Essential (primary) hypertension: Secondary | ICD-10-CM | POA: Diagnosis not present

## 2019-07-22 DIAGNOSIS — H35033 Hypertensive retinopathy, bilateral: Secondary | ICD-10-CM

## 2019-07-22 DIAGNOSIS — H25813 Combined forms of age-related cataract, bilateral: Secondary | ICD-10-CM

## 2019-07-22 DIAGNOSIS — H35353 Cystoid macular degeneration, bilateral: Secondary | ICD-10-CM | POA: Diagnosis not present

## 2019-07-22 DIAGNOSIS — H44113 Panuveitis, bilateral: Secondary | ICD-10-CM | POA: Diagnosis not present

## 2019-07-22 DIAGNOSIS — R911 Solitary pulmonary nodule: Secondary | ICD-10-CM

## 2019-07-22 DIAGNOSIS — H3581 Retinal edema: Secondary | ICD-10-CM

## 2019-07-22 DIAGNOSIS — H209 Unspecified iridocyclitis: Secondary | ICD-10-CM

## 2019-07-22 LAB — COMPREHENSIVE METABOLIC PANEL
ALT: 10 IU/L (ref 0–32)
AST: 17 IU/L (ref 0–40)
Albumin/Globulin Ratio: 1.6 (ref 1.2–2.2)
Albumin: 4.2 g/dL (ref 3.8–4.8)
Alkaline Phosphatase: 82 IU/L (ref 39–117)
BUN/Creatinine Ratio: 12 (ref 12–28)
BUN: 9 mg/dL (ref 8–27)
Bilirubin Total: 0.4 mg/dL (ref 0.0–1.2)
CO2: 26 mmol/L (ref 20–29)
Calcium: 9.7 mg/dL (ref 8.7–10.3)
Chloride: 105 mmol/L (ref 96–106)
Creatinine, Ser: 0.75 mg/dL (ref 0.57–1.00)
GFR calc Af Amer: 97 mL/min/{1.73_m2} (ref >59)
GFR calc non Af Amer: 84 mL/min/{1.73_m2} (ref >59)
Globulin, Total: 2.6 g/dL (ref 1.5–4.5)
Glucose: 91 mg/dL (ref 65–99)
Potassium: 3.5 mmol/L (ref 3.5–5.2)
Sodium: 144 mmol/L (ref 134–144)
Total Protein: 6.8 g/dL (ref 6.0–8.5)

## 2019-07-22 LAB — ANA: Anti Nuclear Antibody (ANA): NEGATIVE

## 2019-07-22 LAB — HLA A,B,C (IR)

## 2019-07-22 LAB — QUANTIFERON-TB GOLD PLUS
QuantiFERON Mitogen Value: >10 IU/mL
QuantiFERON Nil Value: 0.03 IU/mL
QuantiFERON TB1 Ag Value: 0.03 IU/mL
QuantiFERON TB2 Ag Value: 0.03 IU/mL
QuantiFERON-TB Gold Plus: NEGATIVE

## 2019-07-22 LAB — CBC
Hematocrit: 43.5 % (ref 34.0–46.6)
Hemoglobin: 14.5 g/dL (ref 11.1–15.9)
MCH: 28.5 pg (ref 26.6–33.0)
MCHC: 33.3 g/dL (ref 31.5–35.7)
MCV: 86 fL (ref 79–97)
Platelets: 293 10*3/uL (ref 150–450)
RBC: 5.08 x10E6/uL (ref 3.77–5.28)
RDW: 13.2 % (ref 11.7–15.4)
WBC: 6.6 10*3/uL (ref 3.4–10.8)

## 2019-07-22 LAB — ANGIOTENSIN CONVERTING ENZYME: Angio Convert Enzyme: 18 U/L (ref 14–82)

## 2019-07-22 LAB — ANTINEUTROPHIL CYTOPLASMIC AB
Atypical pANCA: <1:20 {titer}
Cytoplasmic (C-ANCA): <1:20 {titer}
Perinuclear (P-ANCA): <1:20 {titer}

## 2019-07-22 LAB — TOXOPLASMA ANTIBODIES- IGG AND  IGM
Toxoplasma Antibody- IgM: <3 AU/mL (ref 0.0–7.9)
Toxoplasma IgG Ratio: 170 IU/mL — ABNORMAL HIGH (ref 0.0–7.1)

## 2019-07-22 LAB — C-REACTIVE PROTEIN: CRP: 3 mg/L (ref 0–10)

## 2019-07-22 LAB — SEDIMENTATION RATE: Sed Rate: 8 mm/hr (ref 0–40)

## 2019-07-22 LAB — RHEUMATOID FACTOR: Rheumatoid fact SerPl-aCnc: <10 IU/mL (ref 0.0–13.9)

## 2019-07-22 LAB — RPR: RPR Ser Ql: NONREACTIVE

## 2019-07-22 MED ORDER — SULFAMETHOXAZOLE-TRIMETHOPRIM 800-160 MG PO TABS: 1.0000 | ORAL_TABLET | Freq: Two times a day (BID) | ORAL | 2 refills | Status: DC

## 2019-07-23 DIAGNOSIS — R911 Solitary pulmonary nodule: Secondary | ICD-10-CM | POA: Diagnosis not present

## 2019-07-23 DIAGNOSIS — I700 Atherosclerosis of aorta: Secondary | ICD-10-CM | POA: Diagnosis not present

## 2019-07-23 DIAGNOSIS — H35353 Cystoid macular degeneration, bilateral: Secondary | ICD-10-CM | POA: Diagnosis not present

## 2019-07-23 DIAGNOSIS — R918 Other nonspecific abnormal finding of lung field: Secondary | ICD-10-CM | POA: Diagnosis not present

## 2019-07-23 DIAGNOSIS — K449 Diaphragmatic hernia without obstruction or gangrene: Secondary | ICD-10-CM | POA: Diagnosis not present

## 2019-07-23 DIAGNOSIS — E039 Hypothyroidism, unspecified: Secondary | ICD-10-CM | POA: Diagnosis not present

## 2019-07-23 DIAGNOSIS — Z299 Encounter for prophylactic measures, unspecified: Secondary | ICD-10-CM | POA: Diagnosis not present

## 2019-07-23 DIAGNOSIS — I10 Essential (primary) hypertension: Secondary | ICD-10-CM | POA: Diagnosis not present

## 2019-07-29 ENCOUNTER — Other Ambulatory Visit: Payer: Self-pay | Admitting: Internal Medicine

## 2019-07-29 ENCOUNTER — Other Ambulatory Visit (HOSPITAL_COMMUNITY): Payer: Self-pay | Admitting: Internal Medicine

## 2019-07-29 ENCOUNTER — Other Ambulatory Visit: Payer: Self-pay

## 2019-07-29 DIAGNOSIS — R911 Solitary pulmonary nodule: Secondary | ICD-10-CM

## 2019-07-30 NOTE — Progress Notes (Signed)
[facility] Note  08/05/2019     CHIEF COMPLAINT Patient presents for Retina Follow Up   HISTORY OF PRESENT ILLNESS: Sylvia Anthony is a 64 y.o. female who presents to the clinic today for:   HPI    Retina Follow Up    Patient presents with  Other.  In both eyes.  This started weeks ago.  Severity is moderate.  Duration of weeks.  Since onset it is rapidly improving.  I, the attending physician,  performed the HPI with the patient and updated documentation appropriately.          Comments    Pt states her vision is much better from prior visit OU.  Patient denies eye pain or discomfort and denies any new or worsening floaters or fol OU.       Last edited by [name], MD on 08/05/2019  7:57 AM. (History)    pt states she feels like her vision is doing much better, she feels like the bactrim and prolensa have helped, she states she saw her PCP, Dr. Woody Seller, about the lung nodules, she had a CT scan which showed the nodule, but no other lesions, she states she is having a PET scan on Thursday  Referring physician: Glenda Chroman, MD [place],  [place] [id number]  HISTORICAL INFORMATION:   Selected notes from the MEDICAL RECORD NUMBER Referred by [name] for concern of bilateral vitreitis.   CURRENT MEDICATIONS: Current Outpatient Medications (Ophthalmic Drugs)  Medication Sig  . Bromfenac Sodium (PROLENSA) 0.07 % SOLN Place 1 drop into both eyes 4 (four) times daily.  . prednisoLONE acetate (PRED FORTE) 1 % ophthalmic suspension INSTILL ONE DROP IN EACH EYE EVERY HOUR WHILE AWAKE FOR 3 DAYS THEN EVERY OTHER HOUR WHILE AWAKE UNTIL NEXT APPOINTMENT.   No current facility-administered medications for this visit. (Ophthalmic Drugs)   Current Outpatient Medications (Other)  Medication Sig  . albuterol (PROVENTIL HFA;VENTOLIN HFA) 108 (90 BASE) MCG/ACT inhaler Inhale 2 puffs into the lungs every 6 (six) hours as needed for wheezing or shortness  of breath.  Marland Kitchen aspirin EC 81 MG tablet Take 1 tablet (81 mg total) by mouth daily.  . cetirizine (ZYRTEC) 10 MG tablet Take 10 mg by mouth at bedtime as needed for allergies.   . cyclobenzaprine (FLEXERIL) 10 MG tablet Take 1 tablet (10 mg total) by mouth 3 (three) times daily.  . diclofenac (VOLTAREN) 75 MG EC tablet Take 75 mg by mouth daily as needed for mild pain.  Marland Kitchen HYDROcodone-acetaminophen (NORCO/VICODIN) 5-325 MG tablet Take 1 tablet by mouth every 6 (six) hours as needed for pain.   Marland Kitchen levothyroxine (SYNTHROID, LEVOTHROID) 112 MCG tablet Take 112 mcg by mouth daily before breakfast.   . metoprolol succinate (TOPROL-XL) 50 MG 24 hr tablet Take 50 mg by mouth daily.  . nitroGLYCERIN (NITROSTAT) 0.4 MG SL tablet Place 1 tablet (0.4 mg total) under the tongue every 5 (five) minutes as needed for chest pain.  . pantoprazole (PROTONIX) 40 MG tablet Take 1 tablet (40 mg total) by mouth 2 (two) times daily before a meal.  . potassium chloride SA (K-DUR,KLOR-CON) 20 MEQ tablet Take 20 mEq by mouth daily.   . rosuvastatin (CRESTOR) 20 MG tablet Take 20 mg by mouth daily.  Marland Kitchen sulfamethoxazole-trimethoprim (BACTRIM DS) 800-160 MG tablet Take 1 tablet by mouth 2 (two) times daily.  . traMADol (ULTRAM) 50 MG tablet 1 tab at HS, or q6h prn  severe pain.  . valsartan-hydrochlorothiazide (DIOVAN-HCT) 320-25 MG per tablet Take 1 tablet by mouth daily.     No current facility-administered medications for this visit. (Other)      REVIEW OF SYSTEMS: ROS    Positive for: Eyes   Negative for: Constitutional, Gastrointestinal, Neurological, Skin, Genitourinary, Musculoskeletal, HENT, Endocrine, Cardiovascular, Respiratory, Psychiatric, Allergic/Imm, Heme/Lymph   Last edited by Doneen Poisson on 08/05/2019  7:40 AM. (History)       ALLERGIES Allergies  Allergen Reactions  . Gabapentin     SWELLING "OUT OF IT" 12/23/16    PAST MEDICAL HISTORY Past Medical History:  Diagnosis Date  . Anemia   .  Arm numbness   . 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 Carotid bruit   . CHF (congestive heart failure) ([place]) 1997   pacemaker  . Fibromyalgia   . GERD (gastroesophageal reflux disease)   . Glucose intolerance (impaired glucose tolerance)   . H. pylori infection Sept 2013   Amoxicillin, Biaxin  . Hyperlipidemia   . Hypertension   . Hypothyroidism   . Mitral valve prolapse   . Peripheral vascular disease ([place])   . Rheumatic fever    age 3, was at [facility] for 17 weeks  . SSS (sick sinus syndrome) [facility])    Past Surgical History:  Procedure Laterality Date  . ABDOMINAL HYSTERECTOMY     partial  . APPENDECTOMY    . CARDIAC CATHETERIZATION     multiple  . CHOLECYSTECTOMY    . COLONOSCOPY  08/01/2006   3 mm descending colon polyp removed/8 mm sessile ascending colon polyp removed / 3-mm rectal  polyp removed /Rare sigmoid diverticulosis/ Moderate internal hemorrhoids.Advanced adenoma on colonoscopy in March 2008.  The polyp was anadenomatous polyp with a foci of high-grade dysplasia  . COLONOSCOPY  09/08/2008   SIMPLE ADENOMA/HYPERPLASTIC POLY/Multiple colon polyps (ascending, sigmoid, rectal)  Mild sigmoid colon diverticulosis./ Small internal hemorrhoids  . COLONOSCOPY N/A 12/11/2017   Dr. Oneida Alar: diverticulosis, hemorrhoids next surveillance tcs 5 years.   . COLONOSCOPY WITH ESOPHAGOGASTRODUODENOSCOPY (EGD)  Sept. 30, 2013   OEV:OJJK diverticulosis was noted in the sigmoid colon/The colon was otherwise normal/Small internal hemorrhoids/EGD:The mucosa of the esophagus appeared normal/Non-erosive gastritis (inflammation) was found; multiple bx/The duodenal mucosa showed no abnormalities. +H.pylori gastritis, treated with equivalent of prevpac. SAVARY DILATION  . ESOPHAGEAL DILATION N/A 10/07/2014   Procedure: ESOPHAGEAL DILATION;  Surgeon: Danie Binder, MD;  Location: [facility];  Service: Endoscopy;  Laterality: N/A;  . ESOPHAGOGASTRODUODENOSCOPY N/A 10/07/2014   Dr.  Oneida Alar: moderate non-erosive gastritis, no definite stricture, empiric dilation . negative H.pylori   . growth removed from intestine     [facility]-as teenager, done through colonoscopy  . PACEMAKER GENERATOR CHANGE  09/18/2006   generator change by Dr Leonia Reeves with a MDT Adapta L  . PACEMAKER INSERTION  09/13/1995   for sick sinus syndome and syncope at [facility]  . PPM GENERATOR CHANGEOUT N/A 06/11/2018    Medtronic Azure XT DR MRI SureScan model [id number] (serial number [id number]) pacemaker by Dr Rayann Heman  . right Achilles tendon     X 2  . TONSILLECTOMY      FAMILY HISTORY Family History  Problem Relation Age of Onset  . Hyperlipidemia Mother   . Hypertension Mother   . Heart disease Father   . Hypertension Father   . Hyperlipidemia Sister   . Hypertension Sister   . Colon polyps Sister   . Hyperlipidemia Brother   .  Hypertension Brother   . Colon polyps Brother   . Colon cancer Neg Hx     SOCIAL HISTORY Social History   Tobacco Use  . Smoking status: Former Smoker    Packs/day: 0.25    Years: 4.00    Pack years: 1.00    Quit date: 10/09/1985    Years since quitting: 33.8  . Smokeless tobacco: Never Used  . Tobacco comment: just in teens  Substance Use Topics  . Alcohol use: No    Alcohol/week: 0.0 standard drinks  . Drug use: No         OPHTHALMIC EXAM:  Base Eye Exam    Visual Acuity (Snellen - Linear)      Right Left   Dist [place] 20/30 +2 20/25 -1   Dist ph [place] NI NI       Tonometry (Tonopen, 7:44 AM)      Right Left   Pressure 21 19       Pupils      Dark Light Shape React APD   Right 4 3 Round Brisk 0   Left 4 3 Round Brisk 0       Visual Fields      Left Right    Full Full       Extraocular Movement      Right Left    Full Full       Neuro/Psych    Oriented x3: Yes   Mood/Affect: Normal       Dilation    Both eyes: 1.0% Mydriacyl, 2.5% Phenylephrine @ 7:44 AM        Slit Lamp and Fundus Exam    Slit Lamp Exam      Right  Left   Lids/Lashes Mild Dermatochalasis - upper lid Mild Dermatochalasis - upper lid   Conjunctiva/Sclera Nasal Pinguecula, mild Melanosis, white and quiet Nasal Pinguecula, mild Melanosis, white and quiet   Cornea Mild Arcus, 1+ inferior Punctate epithelial erosions Mild Arcus, 1+ inferior Punctate epithelial erosions   Anterior Chamber deep, narrow temporal angle, 0.5+cell/pigment deep, narrow temporal angle, 0.5+cell/pigment   Iris Round and dilated Round and dilated   Lens 2+ Nuclear sclerosis, 2+ Cortical cataract, trace pigment on anterior capsule 2+ Nuclear sclerosis, 2+ Cortical cataract   Vitreous Vitreous syneresis, 1-2+cell/pigment, trace to 1+ vit haze, no snowballs Vitreous syneresis, 1-2+cell/pigment, trace vit haze, no snowballs       Fundus Exam      Right Left   Disc Pink and Sharp Pink and Sharp   C/D Ratio 0.5 0.4   Macula Improved foveal reflex, central CME -- improved, No heme  Improved foveal reflex, central CME -- improved, mild Retinal pigment epithelial mottling, no heme   Vessels Vascular attenuation, mild Tortuousity Vascular attenuation, mild Tortuousity   Periphery Attached, no heme, no snowbanking  Attached, no heme, no snowbanking          IMAGING AND PROCEDURES  Imaging and Procedures for @TODAY @  OCT, Retina - OU - Both Eyes       Right Eye Quality was good. Central Foveal Thickness: 283. Progression has improved. Findings include normal foveal contour, no IRF, no SRF (Trace central SRHM; interval improvement in foveal profile and CME).   Left Eye Quality was good. Central Foveal Thickness: 268. Progression has improved. Findings include normal foveal contour, no IRF, no SRF (Interval resolution of CME; return to normal foveal profile).   Notes *Images captured and stored on drive  Diagnosis / Impression:  NFP,  no IRF/SRF OU OD: Trace central SRHM; interval improvement in foveal profile and CME [id number] resolution of CME; return to normal  foveal profile   Clinical management:  See below  Abbreviations: NFP - Normal foveal profile. CME - cystoid macular edema. PED - pigment epithelial detachment. IRF - intraretinal fluid. SRF - subretinal fluid. EZ - ellipsoid zone. ERM - epiretinal membrane. ORA - outer retinal atrophy. ORT - outer retinal tubulation. SRHM - subretinal hyper-reflective material                 ASSESSMENT/PLAN:    ICD-10-CM   1. Cystoid macular edema of both eyes  H35.353   2. 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 Essential hypertension  I10   6. Hypertensive retinopathy of both eyes  H35.033   7. Combined forms of age-related cataract of both eyes  H25.813   8. Nodule of right lung  R91.1     1-4. Mild Panuveitis w/ CME OU  - pt reports 1+ mo history of floaters and decreased vision OU; +photophobia  - saw [name] last week, who noted AC cell and started PF q1h  - initial exam here today showed +cell/pigment in [facility] and vitreous cavity; mild vitreous haze and +central CME OU  - FA (02.15.21) shows central petaloid hyperfluorescence and hyperfluorescence of the optic disc  - pt reports history of fibromyalgia, family history of lupus  - initiated uveitis lab work up -- all WNL except +toxo IgG   CBC, CMP   RPR, VDRL, FTA-Abs, MHA   HIV, Lyme, Quant-Gold   Toxoplasma titers   HLA Panel   ANA   ANCA   ACE, Lysozyme   RF   ESR, CRP   CXR  - OCT shows interval improvement in CME and return to normal foveal contour OU  - discussed findings  - decrease PF to 6x/day; cont Prolensa QID OU  - cont po bactrim DS bid for the prophylactic treatment of Toxoplasmosis  - chest x-ray without TB or pulmonary sarcoidosis, but did reveal a1.6cm nodular mass in right lung base  - f/u 4 weeks -- DFE/OCT, FA transit OD  5,6. Hypertensive retinopathy OU  - discussed importance of tight BP control  - monitor  7. Mixed form age  related cataract OU  - The symptoms of cataract, surgical options, and treatments and risks were discussed with patient.  - discussed diagnosis and progression  - not yet visually significant  - monitor for now  8. 1.6 cm lung nodule of R lung base found on CXR  - discussed with Dr. Woody Seller as above  - CT scan performed at [facility]  - pt scheduled for PET scan this Thursday   Ophthalmic Meds Ordered this visit:  No orders of the defined types were placed in this encounter.      Return in about 4 weeks (around 09/02/2019) for f/u panuveitis OU , DFE, OCT.  There are no Patient Instructions on file for this visit.   Explained the diagnoses, plan, and follow up with the patient and they expressed understanding.  Patient expressed understanding of the importance of proper follow up care.  This document serves as a record of services personally performed by Gardiner Sleeper, MD, PhD. It was created on their behalf by Leeann Must, [place], a certified ophthalmic assistant. The creation of this record  is the provider's dictation and/or activities during the visit.    Electronically signed by: Leeann Must, COA @TODAY @ 8:29 AM  This document serves as a record of services personally performed by Gardiner Sleeper, MD, PhD. It was created on their behalf by Ernest Mallick, OA, an ophthalmic assistant. The creation of this record is the provider's dictation and/or activities during the visit.    Electronically signed by: Ernest Mallick, OA 03.08.2021 8:29 AM  Gardiner Sleeper, M.D., Ph.D. Diseases & Surgery of the Retina and [place] 08/05/2019   I have reviewed the above documentation for accuracy and completeness, and I agree with the above. Gardiner Sleeper, M.D., Ph.D. 08/05/19 8:29 AM    Abbreviations: M myopia (nearsighted); A astigmatism; H hyperopia (farsighted); P presbyopia; Mrx spectacle prescription;  CTL contact lenses; OD right eye;  OS left eye; OU both eyes  XT exotropia; ET esotropia; PEK punctate epithelial keratitis; PEE punctate epithelial erosions; DES dry eye syndrome; MGD meibomian gland dysfunction; ATs artificial tears; PFAT's preservative free artificial tears; [place] nuclear sclerotic cataract; PSC posterior subcapsular cataract; ERM epi-retinal membrane; PVD posterior vitreous detachment; RD retinal detachment; DM diabetes mellitus; DR diabetic retinopathy; NPDR non-proliferative diabetic retinopathy; PDR proliferative diabetic retinopathy; CSME clinically significant macular edema; DME diabetic macular edema; dbh dot blot hemorrhages; CWS cotton wool spot; POAG primary open angle glaucoma; C/D cup-to-disc ratio; HVF humphrey visual field; GVF goldmann visual field; OCT optical coherence tomography; IOP intraocular pressure; BRVO Branch retinal vein occlusion; CRVO central retinal vein occlusion; CRAO central retinal artery occlusion; BRAO branch retinal artery occlusion; RT retinal tear; SB scleral buckle; PPV pars plana vitrectomy; VH Vitreous hemorrhage; PRP panretinal laser photocoagulation; IVK intravitreal kenalog; VMT vitreomacular traction; MH Macular hole;  NVD neovascularization of the disc; NVE neovascularization elsewhere; AREDS age related eye disease study; ARMD age related macular degeneration; POAG primary open angle glaucoma; EBMD epithelial/anterior basement membrane dystrophy; ACIOL anterior chamber intraocular lens; IOL intraocular lens; PCIOL posterior chamber intraocular lens; Phaco/IOL phacoemulsification with intraocular lens placement; [place] photorefractive keratectomy; LASIK laser assisted in situ keratomileusis; HTN hypertension; DM diabetes mellitus; COPD chronic obstructive pulmonary disease

## 2019-08-05 ENCOUNTER — Ambulatory Visit (INDEPENDENT_AMBULATORY_CARE_PROVIDER_SITE_OTHER): Payer: Medicare Other | Admitting: Ophthalmology

## 2019-08-05 ENCOUNTER — Encounter (INDEPENDENT_AMBULATORY_CARE_PROVIDER_SITE_OTHER): Payer: Self-pay | Admitting: Ophthalmology

## 2019-08-05 DIAGNOSIS — H35033 Hypertensive retinopathy, bilateral: Secondary | ICD-10-CM

## 2019-08-05 DIAGNOSIS — H44113 Panuveitis, bilateral: Secondary | ICD-10-CM | POA: Diagnosis not present

## 2019-08-05 DIAGNOSIS — R911 Solitary pulmonary nodule: Secondary | ICD-10-CM

## 2019-08-05 DIAGNOSIS — H3581 Retinal edema: Secondary | ICD-10-CM | POA: Diagnosis not present

## 2019-08-05 DIAGNOSIS — I10 Essential (primary) hypertension: Secondary | ICD-10-CM | POA: Diagnosis not present

## 2019-08-05 DIAGNOSIS — H25813 Combined forms of age-related cataract, bilateral: Secondary | ICD-10-CM

## 2019-08-05 DIAGNOSIS — H209 Unspecified iridocyclitis: Secondary | ICD-10-CM | POA: Diagnosis not present

## 2019-08-05 DIAGNOSIS — H35353 Cystoid macular degeneration, bilateral: Secondary | ICD-10-CM | POA: Diagnosis not present

## 2019-08-08 ENCOUNTER — Other Ambulatory Visit: Payer: Self-pay

## 2019-08-08 ENCOUNTER — Ambulatory Visit (HOSPITAL_COMMUNITY)
Admission: RE | Admit: 2019-08-08 | Discharge: 2019-08-08 | Disposition: A | Discharge status: Home / Self Care | Payer: Medicare Other | Source: Ambulatory Visit | Attending: Internal Medicine | Admitting: Internal Medicine

## 2019-08-08 DIAGNOSIS — R911 Solitary pulmonary nodule: Secondary | ICD-10-CM

## 2019-08-08 LAB — GLUCOSE, CAPILLARY: Glucose-Capillary: 101 mg/dL — ABNORMAL HIGH (ref 70–99)

## 2019-08-08 MED ORDER — FLUDEOXYGLUCOSE F - 18 (FDG) INJECTION
9.8000 | Freq: Once | INTRAVENOUS | Status: AC
  Administered 2019-08-08: 9.8 via INTRAVENOUS

## 2019-08-13 DIAGNOSIS — I10 Essential (primary) hypertension: Secondary | ICD-10-CM | POA: Diagnosis not present

## 2019-08-13 DIAGNOSIS — R911 Solitary pulmonary nodule: Secondary | ICD-10-CM | POA: Diagnosis not present

## 2019-08-13 DIAGNOSIS — I700 Atherosclerosis of aorta: Secondary | ICD-10-CM | POA: Diagnosis not present

## 2019-08-13 DIAGNOSIS — Z299 Encounter for prophylactic measures, unspecified: Secondary | ICD-10-CM | POA: Diagnosis not present

## 2019-08-20 ENCOUNTER — Telehealth: Payer: Self-pay | Admitting: Pulmonary Disease

## 2019-08-20 DIAGNOSIS — Z79899 Other long term (current) drug therapy: Secondary | ICD-10-CM | POA: Diagnosis not present

## 2019-08-20 DIAGNOSIS — E7800 Pure hypercholesterolemia, unspecified: Secondary | ICD-10-CM | POA: Diagnosis not present

## 2019-08-20 DIAGNOSIS — I10 Essential (primary) hypertension: Secondary | ICD-10-CM | POA: Diagnosis not present

## 2019-08-20 DIAGNOSIS — Z7189 Other specified counseling: Secondary | ICD-10-CM | POA: Diagnosis not present

## 2019-08-20 DIAGNOSIS — E039 Hypothyroidism, unspecified: Secondary | ICD-10-CM | POA: Diagnosis not present

## 2019-08-20 DIAGNOSIS — Z299 Encounter for prophylactic measures, unspecified: Secondary | ICD-10-CM | POA: Diagnosis not present

## 2019-08-20 DIAGNOSIS — Z0000 Encounter for general adult medical examination without abnormal findings: Secondary | ICD-10-CM | POA: Diagnosis not present

## 2019-08-20 DIAGNOSIS — Z1211 Encounter for screening for malignant neoplasm of colon: Secondary | ICD-10-CM | POA: Diagnosis not present

## 2019-08-20 DIAGNOSIS — R5383 Other fatigue: Secondary | ICD-10-CM | POA: Diagnosis not present

## 2019-08-20 NOTE — Telephone Encounter (Signed)
Called and spoke with pt in regards to appt. Pt's appt has been changed from 5/17 with VS to 4/9 with RB. Nothing further needed.

## 2019-08-29 NOTE — Progress Notes (Addendum)
[facility] [address] [facility] Note  09/02/2019     CHIEF COMPLAINT Patient presents for Retina Follow Up   HISTORY OF PRESENT ILLNESS: Sylvia Anthony is a 64 y.o. female who presents to the clinic today for:   HPI    Retina Follow Up    Patient presents with  Other.  In both eyes.  This started 4 weeks ago.  Severity is moderate.  I, the attending physician,  performed the HPI with the patient and updated documentation appropriately.          Comments    Patient here for 4 weeks retina follow up for Panuveitis ou. Patient states vision doing a whole lot better. No eye pain.        Last edited by [name], MD on 09/02/2019  9:05 AM. (History)    pt states she had a PET scan, but states it came out both hot/cold and wanted to repeat it in 3 months, but pt states Dr. Woody Seller wanted her to be seen sooner so she has an appt with [name] at [facility] on April 9th, pt states, she is using PF 6x/day, Prolensa qid and bactrim DS po bid   Referring physician: Glenda Chroman, MD [place],  [place] [address]  HISTORICAL INFORMATION:   Selected notes from the MEDICAL RECORD NUMBER Referred by [name] for concern of bilateral vitreitis.   CURRENT MEDICATIONS: Current Outpatient Medications (Ophthalmic Drugs)  Medication Sig  . Bromfenac Sodium (PROLENSA) 0.07 % SOLN Place 1 drop into both eyes 4 (four) times daily.  . prednisoLONE acetate (PRED FORTE) 1 % ophthalmic suspension Place 1 drop into both eyes 4 (four) times daily.   No current facility-administered medications for this visit. (Ophthalmic Drugs)   Current Outpatient Medications (Other)  Medication Sig  . albuterol (PROVENTIL HFA;VENTOLIN HFA) 108 (90 BASE) MCG/ACT inhaler Inhale 2 puffs into the lungs every 6 (six) hours as needed for wheezing or shortness of breath.  Marland Kitchen aspirin EC 81 MG tablet Take 1 tablet (81 mg total) by mouth daily.  . cetirizine (ZYRTEC) 10 MG tablet Take 10 mg by mouth  at bedtime as needed for allergies.   . cyclobenzaprine (FLEXERIL) 10 MG tablet Take 1 tablet (10 mg total) by mouth 3 (three) times daily.  . diclofenac (VOLTAREN) 75 MG EC tablet Take 75 mg by mouth daily as needed for mild pain.  Marland Kitchen HYDROcodone-acetaminophen (NORCO/VICODIN) 5-325 MG tablet Take 1 tablet by mouth every 6 (six) hours as needed for pain.   Marland Kitchen levothyroxine (SYNTHROID, LEVOTHROID) 112 MCG tablet Take 112 mcg by mouth daily before breakfast.   . metoprolol succinate (TOPROL-XL) 50 MG 24 hr tablet Take 50 mg by mouth daily.  . nitroGLYCERIN (NITROSTAT) 0.4 MG SL tablet Place 1 tablet (0.4 mg total) under the tongue every 5 (five) minutes as needed for chest pain.  . pantoprazole (PROTONIX) 40 MG tablet Take 1 tablet (40 mg total) by mouth 2 (two) times daily before a meal.  . potassium chloride SA (K-DUR,KLOR-CON) 20 MEQ tablet Take 20 mEq by mouth daily.   . rosuvastatin (CRESTOR) 20 MG tablet Take 20 mg by mouth daily.  Marland Kitchen sulfamethoxazole-trimethoprim (BACTRIM DS) 800-160 MG tablet Take 1 tablet by mouth 2 (two) times daily.  . traMADol (ULTRAM) 50 MG tablet 1 tab at HS, or q6h prn severe pain.  . valsartan-hydrochlorothiazide (DIOVAN-HCT) 320-25 MG per tablet Take 1 tablet by mouth daily.  No current facility-administered medications for this visit. (Other)      REVIEW OF SYSTEMS: ROS    Positive for: Musculoskeletal, Cardiovascular, Eyes   Negative for: Constitutional, Gastrointestinal, Neurological, Skin, Genitourinary, HENT, Endocrine, Respiratory, Psychiatric, Allergic/Imm, Heme/Lymph   Last edited by Theodore Demark, COA on 09/02/2019  8:33 AM. (History)       ALLERGIES Allergies  Allergen Reactions  . Gabapentin     SWELLING "OUT OF IT" 12/23/16    PAST MEDICAL HISTORY Past Medical History:  Diagnosis Date  . Anemia   . Arm numbness   . Arterial fibromuscular dysplasia ([place])   . Carotid bruit   . CHF (congestive heart failure) ([place]) 1997   pacemaker   . Fibromyalgia   . GERD (gastroesophageal reflux disease)   . Glucose intolerance (impaired glucose tolerance)   . H. pylori infection Sept 2013   Amoxicillin, Biaxin  . Hyperlipidemia   . Hypertension   . Hypothyroidism   . Mitral valve prolapse   . Peripheral vascular disease ([place])   . Rheumatic fever    age 59, was at [facility] for 17 weeks  . SSS (sick sinus syndrome) [facility])    Past Surgical History:  Procedure Laterality Date  . ABDOMINAL HYSTERECTOMY     partial  . APPENDECTOMY    . CARDIAC CATHETERIZATION     multiple  . CHOLECYSTECTOMY    . COLONOSCOPY  08/01/2006   3 mm descending colon polyp removed/8 mm sessile ascending colon polyp removed / 3-mm rectal  polyp removed /Rare sigmoid diverticulosis/ Moderate internal hemorrhoids.Advanced adenoma on colonoscopy in March 2008.  The polyp was anadenomatous polyp with a foci of high-grade dysplasia  . COLONOSCOPY  09/08/2008   SIMPLE ADENOMA/HYPERPLASTIC POLY/Multiple colon polyps (ascending, sigmoid, rectal)  Mild sigmoid colon diverticulosis./ Small internal hemorrhoids  . COLONOSCOPY N/A 12/11/2017   Dr. Oneida Alar: diverticulosis, hemorrhoids next surveillance tcs 5 years.   . COLONOSCOPY WITH ESOPHAGOGASTRODUODENOSCOPY (EGD)  Sept. 30, 2013   ORV:IFBP diverticulosis was noted in the sigmoid colon/The colon was otherwise normal/Small internal hemorrhoids/EGD:The mucosa of the esophagus appeared normal/Non-erosive gastritis (inflammation) was found; multiple bx/The duodenal mucosa showed no abnormalities. +H.pylori gastritis, treated with equivalent of prevpac. SAVARY DILATION  . ESOPHAGEAL DILATION N/A 10/07/2014   Procedure: ESOPHAGEAL DILATION;  Surgeon: Danie Binder, MD;  Location: [facility];  Service: Endoscopy;  Laterality: N/A;  . ESOPHAGOGASTRODUODENOSCOPY N/A 10/07/2014   Dr. Oneida Alar: moderate non-erosive gastritis, no definite stricture, empiric dilation . negative H.pylori   . growth removed from intestine      [facility]-as teenager, done through colonoscopy  . PACEMAKER GENERATOR CHANGE  09/18/2006   generator change by Dr Leonia Reeves with a MDT Adapta L  . PACEMAKER INSERTION  09/13/1995   for sick sinus syndome and syncope at [facility]  . PPM GENERATOR CHANGEOUT N/A 06/11/2018    Medtronic Azure XT DR MRI SureScan model [id number] (serial number [id number]) pacemaker by Dr Rayann Heman  . right Achilles tendon     X 2  . TONSILLECTOMY      FAMILY HISTORY Family History  Problem Relation Age of Onset  . Hyperlipidemia Mother   . Hypertension Mother   . Heart disease Father   . Hypertension Father   . Hyperlipidemia Sister   . Hypertension Sister   . Colon polyps Sister   . Hyperlipidemia Brother   . Hypertension Brother   . Colon polyps Brother   . Colon cancer Neg Hx  SOCIAL HISTORY Social History   Tobacco Use  . Smoking status: Former Smoker    Packs/day: 0.25    Years: 4.00    Pack years: 1.00    Quit date: 10/09/1985    Years since quitting: 33.9  . Smokeless tobacco: Never Used  . Tobacco comment: just in teens  Substance Use Topics  . Alcohol use: No    Alcohol/week: 0.0 standard drinks  . Drug use: No         OPHTHALMIC EXAM:  Base Eye Exam    Visual Acuity (Snellen - Linear)      Right Left   Dist [place] 20/30 +2 20/20 -2   Dist ph [place] NI NI       Tonometry (Tonopen, 8:31 AM)      Right Left   Pressure 22 22       Pupils      Dark Light Shape React APD   Right 4 3 Round Brisk None   Left 4 3 Round Brisk None       Visual Fields (Counting fingers)      Left Right    Full Full       Extraocular Movement      Right Left    Full, Ortho Full, Ortho       Neuro/Psych    Oriented x3: Yes   Mood/Affect: Normal       Dilation    Both eyes: 1.0% Mydriacyl, 2.5% Phenylephrine @ 8:31 AM        Slit Lamp and Fundus Exam    Slit Lamp Exam      Right Left   Lids/Lashes Mild Dermatochalasis - upper lid Mild Dermatochalasis - upper lid    Conjunctiva/Sclera Nasal Pinguecula, mild Melanosis, white and quiet Nasal Pinguecula, mild Melanosis, white and quiet   Cornea Mild Arcus, 1+ inferior Punctate epithelial erosions Mild Arcus, 1+ inferior Punctate epithelial erosions   Anterior Chamber deep, narrow temporal angle, 0.5+cell/pigment deep, narrow temporal angle, 0.5+cell/pigment   Iris Round and dilated Round and dilated   Lens 2+ Nuclear sclerosis, 2+ Cortical cataract, trace pigment on anterior capsule 2+ Nuclear sclerosis, 2+ Cortical cataract   Vitreous Vitreous syneresis, 1-2+cell/pigment, trace to 1+ vit haze, ?snowball inferior Vitreous syneresis, 1+cell/pigment, trace vit haze, no snowballs       Fundus Exam      Right Left   Disc Pink and Sharp Pink and Sharp   C/D Ratio 0.5 0.4   Macula Improved foveal reflex, central CME -- stably resolved, No heme  good foveal reflex, stable resolution of CME, mild Retinal pigment epithelial mottling, no heme   Vessels Vascular attenuation, mild Tortuousity Vascular attenuation, mild Tortuousity   Periphery Attached, no heme, no snowbanking  Attached, no heme, no snowbanking          IMAGING AND PROCEDURES  Imaging and Procedures for @TODAY @  OCT, Retina - OU - Both Eyes       Right Eye Quality was good. Central Foveal Thickness: 266. Progression has improved. Findings include normal foveal contour, no IRF, no SRF (Interval resolution of central SRHM; stable resolution of CME).   Left Eye Quality was good. Central Foveal Thickness: 260. Progression has been stable. Findings include normal foveal contour, no IRF, no SRF (Stable resolution of CME; normal foveal profile).   Notes *Images captured and stored on drive  Diagnosis / Impression:  NFP, no IRF/SRF OU OD: Interval resolution of central SRHM; stable resolution of CME OS: Stable resolution  of CME; normal foveal profile  Clinical management:  See below  Abbreviations: NFP - Normal foveal profile. CME - cystoid  macular edema. PED - pigment epithelial detachment. IRF - intraretinal fluid. SRF - subretinal fluid. EZ - ellipsoid zone. ERM - epiretinal membrane. ORA - outer retinal atrophy. ORT - outer retinal tubulation. SRHM - subretinal hyper-reflective material        Fluorescein Angiography Optos (Transit OD)       Right Eye   Progression has improved. Early phase findings include staining. Mid/Late phase findings include staining, leakage (Interval improvement in of perifoveal petaloid leakage - very mild and late; persistent hyperfluorescence of the disc).   Left Eye   Progression has improved. Early phase findings include staining. Mid/Late phase findings include staining (Interval resolution of central petaloid leakage and scattered peripheral leakage, persistent hyperfluorescence of the disc).   Notes **Images stored on drive**  Impression: OD: Interval improvement in of perifoveal petaloid leakage - very mild and late; persistent hyperfluorescence of the disc OS: Interval resolution of central petaloid leakage and scattered peripheral leakage, persistent hyperfluorescence of the disc                  ASSESSMENT/PLAN:    ICD-10-CM   1. Cystoid macular edema of both eyes  H35.353   2. 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 Essential hypertension  I10   6. Hypertensive retinopathy of both eyes  H35.033 Fluorescein Angiography Optos (Transit OD)  7. Combined forms of age-related cataract of both eyes  H25.813   8. Nodule of right lung  R91.1     1-4. Mild Panuveitis w/ CME OU  - pt reports 1+ mo history of floaters and decreased vision OU; +photophobia  - saw [name], who noted [facility] cell and started PF q1h  - initial exam here today showed +cell/pigment in [facility] and vitreous cavity; mild vitreous haze and +central CME OU  - FA (02.15.21) shows central petaloid hyperfluorescence and  hyperfluorescence of the optic disc  - pt reports history of fibromyalgia, family history of lupus  - initiated uveitis lab work up -- all WNL except +toxo IgG   CBC, CMP   RPR, VDRL, FTA-Abs, MHA   HIV, Lyme, Quant-Gold   Toxoplasma titers   HLA Panel   ANA   ANCA   ACE, Lysozyme   RF   ESR, CRP   CXR  - OCT shows OD: Resolution of central SRHM; stable resolution of CME; OS: Stable resolution of CME; normal foveal profile OU  - repeat FA 4.5.21 shows interval improvement in perifoveal petaloid leakage OU  - discussed findings  - IOP slightly elevated to 22 OU (?steroid response)  - decrease PF to qid; cont Prolensa QID OU  - cont po bactrim DS bid for the prophylactic treatment of Toxoplasmosis  - chest x-ray without TB or pulmonary sarcoidosis, but did reveal a1.6cm nodular mass in right lung base  - hx of Covid-19 infection ?involvement  - f/u 4-6 weeks -- DFE/OCT  5,6. Hypertensive retinopathy OU  - discussed importance of tight BP control  - monitor  7. Mixed form age related cataract OU  - The symptoms of cataract, surgical options, and treatments and risks were discussed with patient.  - discussed diagnosis and progression  - not yet visually significant  - monitor for now  8. 1.6 cm lung  nodule of R lung base found on CXR  - discussed with Dr. Woody Seller as above  - CT scan performed at [facility]  - pt scheduled appt with Dr. Baltazar Apo at [facility] on September 06, 2019   Ophthalmic Meds Ordered this visit:  Meds ordered this encounter  Medications  . DISCONTD: prednisoLONE acetate (PRED FORTE) 1 % ophthalmic suspension    Sig: Place 1 drop into both eyes 4 (four) times daily.    Dispense:  15 mL    Refill:  0  . prednisoLONE acetate (PRED FORTE) 1 % ophthalmic suspension    Sig: Place 1 drop into both eyes 4 (four) times daily.    Dispense:  15 mL    Refill:  0       Return for f/u 4-6 weeks, panuveitis OU, DFE, OCT.  There are no  Patient Instructions on file for this visit.   Explained the diagnoses, plan, and follow up with the patient and they expressed understanding.  Patient expressed understanding of the importance of proper follow up care.  This document serves as a record of services personally performed by Gardiner Sleeper, MD, PhD. It was created on their behalf by Leeann Must, [place], a certified ophthalmic assistant. The creation of this record is the provider's dictation and/or activities during the visit.    Electronically signed by: Leeann Must, COA @TODAY @ 4:45 PM   This document serves as a record of services personally performed by Gardiner Sleeper, MD, PhD. It was created on their behalf by Ernest Mallick, OA, an ophthalmic assistant. The creation of this record is the provider's dictation and/or activities during the visit.    Electronically signed by: Ernest Mallick, OA 04.06.2021 4:45 PM   Gardiner Sleeper, M.D., Ph.D. Diseases & Surgery of the Retina and Vitreous [facility]  I have reviewed the above documentation for accuracy and completeness, and I agree with the above. Gardiner Sleeper, M.D., Ph.D. 09/02/19 4:45 PM   Abbreviations: M myopia (nearsighted); A astigmatism; H hyperopia (farsighted); P presbyopia; Mrx spectacle prescription;  CTL contact lenses; OD right eye; OS left eye; OU both eyes  XT exotropia; ET esotropia; PEK punctate epithelial keratitis; PEE punctate epithelial erosions; DES dry eye syndrome; MGD meibomian gland dysfunction; ATs artificial tears; PFAT's preservative free artificial tears; [place] nuclear sclerotic cataract; PSC posterior subcapsular cataract; ERM epi-retinal membrane; PVD posterior vitreous detachment; RD retinal detachment; DM diabetes mellitus; DR diabetic retinopathy; NPDR non-proliferative diabetic retinopathy; PDR proliferative diabetic retinopathy; CSME clinically significant macular edema; DME diabetic macular edema; dbh dot blot  hemorrhages; CWS cotton wool spot; POAG primary open angle glaucoma; C/D cup-to-disc ratio; HVF humphrey visual field; GVF goldmann visual field; OCT optical coherence tomography; IOP intraocular pressure; BRVO Branch retinal vein occlusion; CRVO central retinal vein occlusion; CRAO central retinal artery occlusion; BRAO branch retinal artery occlusion; RT retinal tear; SB scleral buckle; PPV pars plana vitrectomy; VH Vitreous hemorrhage; PRP panretinal laser photocoagulation; IVK intravitreal kenalog; VMT vitreomacular traction; MH Macular hole;  NVD neovascularization of the disc; NVE neovascularization elsewhere; AREDS age related eye disease study; ARMD age related macular degeneration; POAG primary open angle glaucoma; EBMD epithelial/anterior basement membrane dystrophy; ACIOL anterior chamber intraocular lens; IOL intraocular lens; PCIOL posterior chamber intraocular lens; Phaco/IOL phacoemulsification with intraocular lens placement; [place] photorefractive keratectomy; LASIK laser assisted in situ keratomileusis; HTN hypertension; DM diabetes mellitus; COPD chronic obstructive pulmonary disease

## 2019-09-02 ENCOUNTER — Ambulatory Visit (INDEPENDENT_AMBULATORY_CARE_PROVIDER_SITE_OTHER): Payer: Medicare Other | Admitting: Ophthalmology

## 2019-09-02 ENCOUNTER — Encounter (INDEPENDENT_AMBULATORY_CARE_PROVIDER_SITE_OTHER): Payer: Self-pay | Admitting: Ophthalmology

## 2019-09-02 DIAGNOSIS — H44113 Panuveitis, bilateral: Secondary | ICD-10-CM

## 2019-09-02 DIAGNOSIS — H209 Unspecified iridocyclitis: Secondary | ICD-10-CM | POA: Diagnosis not present

## 2019-09-02 DIAGNOSIS — H25813 Combined forms of age-related cataract, bilateral: Secondary | ICD-10-CM

## 2019-09-02 DIAGNOSIS — H35353 Cystoid macular degeneration, bilateral: Secondary | ICD-10-CM | POA: Diagnosis not present

## 2019-09-02 DIAGNOSIS — H35033 Hypertensive retinopathy, bilateral: Secondary | ICD-10-CM

## 2019-09-02 DIAGNOSIS — R911 Solitary pulmonary nodule: Secondary | ICD-10-CM

## 2019-09-02 DIAGNOSIS — H3581 Retinal edema: Secondary | ICD-10-CM | POA: Diagnosis not present

## 2019-09-02 DIAGNOSIS — I10 Essential (primary) hypertension: Secondary | ICD-10-CM

## 2019-09-02 MED ORDER — PREDNISOLONE ACETATE 1 % OP SUSP: 1.0000 [drp] | Freq: Four times a day (QID) | OPHTHALMIC | 0 refills | Status: DC

## 2019-09-06 ENCOUNTER — Ambulatory Visit: Payer: Medicare Other | Admitting: Emergency Medicine

## 2019-09-06 ENCOUNTER — Encounter: Payer: Self-pay | Admitting: Emergency Medicine

## 2019-09-06 ENCOUNTER — Other Ambulatory Visit: Payer: Self-pay

## 2019-09-06 VITALS — BP 142/86 | HR 72 | Ht 64.5 in | Wt 196.0 lb

## 2019-09-06 DIAGNOSIS — R911 Solitary pulmonary nodule: Secondary | ICD-10-CM | POA: Insufficient documentation

## 2019-09-06 NOTE — Progress Notes (Signed)
Subjective:    Patient ID: Sylvia Anthony, female    DOB: Jun 27, 1955, 64 y.o.   MRN: [id number]  HPI 64 year old woman, former minimal smoker (1 pack year) with a history of arterial fibromuscular dysplasia, hypertension, hypothyroidism, mitral valve prolapse, sick sinus syndrome with a pacemaker in place.   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   CT chest done at [facility] reviewed by me shows an irregular peripheral 1.9x1.2cm nodule PET scan 08/08/19 reviewed by me shows mild hypermetabolism of the nodule, measured at 0.9XI, a hypermetabolic subcarinal node, calcified R hilar node. No evidence distant disease  ACE level 07/15/19 >> 18 RA latex 2/15 >> negative    Review of Systems As per HPI  Past Medical History:  Diagnosis Date  . Anemia   . Arm numbness   . 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 Carotid bruit   . CHF (congestive heart failure) ([place]) 1997   pacemaker  . Fibromyalgia   . GERD (gastroesophageal reflux disease)   . Glucose intolerance (impaired glucose tolerance)   . H. pylori infection Sept 2013   Amoxicillin, Biaxin  . Hyperlipidemia   . Hypertension   . Hypothyroidism   . Mitral valve prolapse   . Peripheral vascular disease ([place])   . Rheumatic fever    age 64, was at [facility] for 17 weeks  . SSS (sick sinus syndrome) ([facility])      Family History  Problem Relation Age of Onset  . Hyperlipidemia Mother   . Hypertension Mother   . Heart disease Father   . Hypertension Father   . Hyperlipidemia Sister   . Hypertension Sister   . Colon polyps Sister   . Hyperlipidemia Brother   . Hypertension Brother   . Colon polyps Brother   . Colon cancer Neg Hx      Social History   Socioeconomic History  . Marital status: Married    Spouse name: Not on file  . Number of children: 1  . Years of education: Not on file  . Highest education level: Not on file    Occupational History  . Occupation: part-time hairdresser  Tobacco Use  . Smoking status: Former Smoker    Packs/day: 0.25    Years: 4.00    Pack years: 1.00    Quit date: 10/09/1985    Years since quitting: 33.9  . Smokeless tobacco: Never Used  . Tobacco comment: just in teens  Substance and Sexual Activity  . Alcohol use: No    Alcohol/week: 0.0 standard drinks  . Drug use: No  . Sexual activity: Not on file  Other Topics Concern  . Not on file  Social History Narrative   Lives in [place] with spouse.  Son is healthy at age 89.   Disabled         Social Determinants of Health   Financial Resource Strain:   . Difficulty of Paying Living Expenses:   Food Insecurity:   . Worried About Charity fundraiser in the Last Year:   . Arboriculturist in the Last Year:   Transportation Needs:   . Film/video editor (Medical):   Marland Kitchen Lack of Transportation (Non-Medical):   Physical Activity:   . Days of Exercise per Week:   . Minutes of Exercise per Session:   Stress:   . Feeling of Stress :   Social Connections:   .  Frequency of Communication with Friends and Family:   . Frequency of Social Gatherings with Friends and Family:   . Attends Religious Services:   . Active Member of Clubs or Organizations:   . Attends Banker Meetings:   Marland Kitchen Marital Status:   Intimate Partner Violence:   . Fear of Current or Ex-Partner:   . Emotionally Abused:   Marland Kitchen Physically Abused:   . 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     Allergies  Allergen Reactions  . Gabapentin     SWELLING "OUT OF IT" 12/23/16     Outpatient Medications Prior to Visit  Medication Sig Dispense Refill  . albuterol (PROVENTIL HFA;VENTOLIN HFA) 108 (90 BASE) MCG/ACT inhaler Inhale 2 puffs into the lungs every 6 (six) hours as  needed for wheezing or shortness of breath.    Marland Kitchen amLODipine (NORVASC) 10 MG tablet Take 10 mg by mouth daily.    Marland Kitchen aspirin EC 81 MG tablet Take 1 tablet (81 mg total) by mouth daily. 90 tablet 3  . cetirizine (ZYRTEC) 10 MG tablet Take 10 mg by mouth at bedtime as needed for allergies.     Marland Kitchen diclofenac (VOLTAREN) 75 MG EC tablet Take 75 mg by mouth daily as needed for mild pain.    Marland Kitchen dicyclomine (BENTYL) 10 MG capsule Take 10 mg by mouth 2 (two) times daily.    Marland Kitchen levothyroxine (SYNTHROID, LEVOTHROID) 112 MCG tablet Take 112 mcg by mouth daily before breakfast.     . metoprolol succinate (TOPROL-XL) 50 MG 24 hr tablet Take 50 mg by mouth daily.    . nitroGLYCERIN (NITROSTAT) 0.4 MG SL tablet Place 1 tablet (0.4 mg total) under the tongue every 5 (five) minutes as needed for chest pain. 25 tablet 3  . pantoprazole (PROTONIX) 40 MG tablet Take 1 tablet (40 mg total) by mouth 2 (two) times daily before a meal. 60 tablet 5  . potassium chloride SA (K-DUR,KLOR-CON) 20 MEQ tablet Take 20 mEq by mouth daily.     . rosuvastatin (CRESTOR) 20 MG tablet Take 20 mg by mouth daily.  6  . valsartan-hydrochlorothiazide (DIOVAN-HCT) 320-25 MG per tablet Take 1 tablet by mouth daily.       No facility-administered medications prior to visit.        Objective:   Physical Exam Vitals:   09/06/19 1057  BP: (!) 142/86  Pulse: 72  SpO2: 98%  Weight: 196 lb (88.9 kg)  Height: 5' 4.5" (1.638 m)   Gen: Pleasant, well-nourished, in no distress,  normal affect  ENT: No lesions,  mouth clear,  oropharynx clear, no postnasal drip  Neck: No JVD, no stridor  Lungs: No use of accessory muscles, no crackles or wheezing on normal respiration, no wheeze on forced expiration  Cardiovascular: RRR, heart sounds normal, no murmur or gallops, no peripheral edema  Musculoskeletal: No deformities, no cyanosis or clubbing  Neuro: alert, awake, non focal  Skin: Warm, no lesions or ras     Assessment & Plan:    Pulmonary nodule 1 cm or greater in diameter Right lower lobe pulmonary nodule found spuriously.  She also has some subcarinal lymphadenopathy, evidence for calcified lymphadenopathy in the right hilum.  Etiology unclear.  She has a negative ACE level, negative RF.  Minimal tobacco history.  Concerning for  possible adenocarcinoma.  I will repeat her CT chest, if the nodule persists, has not significantly decreased in size then we will proceed with navigational bronchoscopy and endobronchial ultrasound to achieve tissue diagnosis.  Procedure discussed with the patient.  She understands the risk, benefits and agrees to schedule.  Levy Pupa, MD, PhD 09/06/2019, 11:54 AM [facility] [phone] or if no answer [phone]

## 2019-09-06 NOTE — Assessment & Plan Note (Signed)
Right lower lobe pulmonary nodule found spuriously.  She also has some subcarinal lymphadenopathy, evidence for calcified lymphadenopathy in the right hilum.  Etiology unclear.  She has a negative ACE level, negative RF.  Minimal tobacco history.  Concerning for possible adenocarcinoma.  I will repeat her CT chest, if the nodule persists, has not significantly decreased in size then we will proceed with navigational bronchoscopy and endobronchial ultrasound to achieve tissue diagnosis.  Procedure discussed with the patient.  She understands the risk, benefits and agrees to schedule.

## 2019-09-06 NOTE — Patient Instructions (Signed)
We will repeat your CT scan of the chest at [facility]. We will arrange for bronchoscopy in the next few weeks.  If your pulmonary nodule has decreased in size on your CT chest then we may be able to cancel this test and plan other follow-up. Follow with Dr Delton Coombes in 1 month

## 2019-09-10 ENCOUNTER — Other Ambulatory Visit: Payer: Self-pay

## 2019-09-10 ENCOUNTER — Ambulatory Visit (HOSPITAL_COMMUNITY)
Admission: RE | Admit: 2019-09-10 | Discharge: 2019-09-10 | Disposition: A | Discharge status: Home / Self Care | Payer: Medicare Other | Source: Ambulatory Visit | Attending: Emergency Medicine | Admitting: Emergency Medicine

## 2019-09-10 DIAGNOSIS — R911 Solitary pulmonary nodule: Secondary | ICD-10-CM | POA: Diagnosis not present

## 2019-09-10 DIAGNOSIS — R918 Other nonspecific abnormal finding of lung field: Secondary | ICD-10-CM | POA: Diagnosis not present

## 2019-09-11 ENCOUNTER — Ambulatory Visit (INDEPENDENT_AMBULATORY_CARE_PROVIDER_SITE_OTHER): Payer: Medicare Other | Admitting: *Deleted

## 2019-09-11 DIAGNOSIS — I495 Sick sinus syndrome: Secondary | ICD-10-CM | POA: Diagnosis not present

## 2019-09-11 LAB — CUP PACEART REMOTE DEVICE CHECK
Battery Remaining Longevity: 158 mo
Battery Voltage: 3.05 V
Brady Statistic AP VP Percent: 0.04 %
Brady Statistic AP VS Percent: 87.31 %
Brady Statistic AS VP Percent: 0.01 %
Brady Statistic AS VS Percent: 12.63 %
Brady Statistic RA Percent Paced: 87.79 %
Brady Statistic RV Percent Paced: 0.06 %
Date Time Interrogation Session: 20210414061403
Implantable Lead Implant Date: 19970416
Implantable Lead Implant Date: 19970416
Implantable Lead Location: 753859
Implantable Lead Location: 753860
Implantable Lead Model: 5034
Implantable Lead Model: 5534
Implantable Pulse Generator Implant Date: 20200113
Lead Channel Impedance Value: 646 Ohm
Lead Channel Impedance Value: 665 Ohm
Lead Channel Impedance Value: 703 Ohm
Lead Channel Impedance Value: 722 Ohm
Lead Channel Pacing Threshold Amplitude: 0.5 V
Lead Channel Pacing Threshold Amplitude: 0.875 V
Lead Channel Pacing Threshold Pulse Width: 0.4 ms
Lead Channel Pacing Threshold Pulse Width: 0.4 ms
Lead Channel Sensing Intrinsic Amplitude: 19.75 mV
Lead Channel Sensing Intrinsic Amplitude: 19.75 mV
Lead Channel Sensing Intrinsic Amplitude: 2 mV
Lead Channel Sensing Intrinsic Amplitude: 2 mV
Lead Channel Setting Pacing Amplitude: 1.5 V
Lead Channel Setting Pacing Amplitude: 2.5 V
Lead Channel Setting Pacing Pulse Width: 0.4 ms
Lead Channel Setting Sensing Sensitivity: 2 mV

## 2019-09-11 NOTE — Progress Notes (Signed)
PPM Remote  

## 2019-09-12 ENCOUNTER — Telehealth: Payer: Self-pay | Admitting: Emergency Medicine

## 2019-09-12 DIAGNOSIS — R911 Solitary pulmonary nodule: Secondary | ICD-10-CM

## 2019-09-12 NOTE — Telephone Encounter (Signed)
Spoke with pt. She would like to know if she still needs to have bronch done based on her CT results.  RB - please advise. Thanks.

## 2019-09-13 NOTE — Telephone Encounter (Signed)
Order has been placed for the CT to be done in July.  Called [facility] at [phone] and spoke with Penni Bombard letting her know that we were going to cancel pt's procedure which is scheduled for 4/22. She verbalized understanding and stated she would take care of it. I cancelled pt's covid test. Called and spoke with pt letting her know this info and stated to her that we would repeat CT in July and she verbalized understanding. Stated we would see her at her May f/u. Nothing further needed.

## 2019-09-13 NOTE — Telephone Encounter (Signed)
I reviewed the scan with her.  We have decide to cancel her ENB / EBUS scheduled for 4/20 She needs to have a repeat super d CT chest at [facility] in July for RUL pulmonary nodule and to f/u with me after in July. She wants to keep her existing appt in may so we can review all the scans to date in person.   Please cancel her procedure that was for 4/20. Thanks.

## 2019-09-16 ENCOUNTER — Other Ambulatory Visit (HOSPITAL_COMMUNITY): Payer: Medicare Other

## 2019-09-19 ENCOUNTER — Encounter (HOSPITAL_COMMUNITY): Admission: RE | Payer: Self-pay | Source: Home / Self Care

## 2019-09-19 ENCOUNTER — Ambulatory Visit (HOSPITAL_COMMUNITY): Admission: RE | Admit: 2019-09-19 | Payer: Medicare Other | Source: Home / Self Care | Admitting: Emergency Medicine

## 2019-09-19 SURGERY — BRONCHOSCOPY, WITH FLUOROSCOPY
Anesthesia: General

## 2019-10-14 ENCOUNTER — Institutional Professional Consult (permissible substitution): Payer: Medicare Other | Admitting: Pulmonary Disease

## 2019-10-18 ENCOUNTER — Encounter: Payer: Self-pay | Admitting: Emergency Medicine

## 2019-10-18 ENCOUNTER — Ambulatory Visit: Payer: Medicare Other | Admitting: Emergency Medicine

## 2019-10-18 ENCOUNTER — Other Ambulatory Visit: Payer: Self-pay

## 2019-10-18 DIAGNOSIS — R911 Solitary pulmonary nodule: Secondary | ICD-10-CM | POA: Diagnosis not present

## 2019-10-18 NOTE — Addendum Note (Signed)
Addended by: Dorisann Frames R on: 10/18/2019 11:15 AM   Modules accepted: Orders

## 2019-10-18 NOTE — Assessment & Plan Note (Signed)
Slightly smaller in at least one dimension on her repeat CT scan from April.  Based on this we canceled her navigational bronchoscopy.  We can follow with serial scans, next to be done in 6 months, October 2021.  If there is an interval change or if symptoms evolve then we will reconsider tissue diagnosis.

## 2019-10-18 NOTE — Progress Notes (Signed)
Subjective:    Patient ID: Sylvia Anthony, female    DOB: [birth year], 64 y.o.   MRN: [id number]  HPI 64 year old woman, former minimal smoker (1 pack year) with a history of arterial fibromuscular dysplasia, hypertension, hypothyroidism, mitral valve prolapse, sick sinus syndrome with a pacemaker in place.   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   CT chest done at [facility] reviewed by me shows an irregular peripheral 1.9x1.2cm nodule PET scan 08/08/19 reviewed by me shows mild hypermetabolism of the nodule, measured at 1.4cm, a hypermetabolic subcarinal node, calcified R hilar node. No evidence distant disease  ACE level 07/15/19 >> 18 RA latex 2/15 >> negative  ROV 10/18/19 --follow-up visit for 64 year old woman with a history of arterial fibromuscular dysplasia, hypertension, mitral valve prolapse, sick sinus syndrome (with pacer), history of panuveitis.  I have followed her for a 1.9 x 1.2 cm nodule noted on PET scan 08/08/2019, mild hypermetabolism.  Also calcified right hilar node and no evidence of distant disease.  We had tentatively planned for bronchoscopy and biopsy but a repeat CT scan done 09/11/2019, reviewed by me shows some possible decrease in size in the 1.7 x 1.2 cm lesion.  She had stable mildly enlarged subcarinal right paratracheal lymph nodes.  Based on the findings we decided to defer her bronchoscopy and repeat her CT to look for interval change.  Her next scan should be in October. She is still following with optho for the uveitis - currently believed to be infectious.  No CP, no cough, no resp symptoms.    Review of Systems As per HPI  Past Medical History:  Diagnosis Date  . Anemia   . Arm numbness   . Arterial fibromuscular dysplasia (HCC)   . Carotid bruit   . CHF (congestive heart failure) (HCC) 1997   pacemaker  . Fibromyalgia   . GERD (gastroesophageal reflux disease)   . Glucose  intolerance (impaired glucose tolerance)   . H. pylori infection Sept 2013   Amoxicillin, Biaxin  . Hyperlipidemia   . Hypertension   . Hypothyroidism   . Mitral valve prolapse   . Peripheral vascular disease (HCC)   . Rheumatic fever    age 62, was at [facility] for 17 weeks  . SSS (sick sinus syndrome) (HCC)      Family History  Problem Relation Age of Onset  . Hyperlipidemia Mother   . Hypertension Mother   . Heart disease Father   . Hypertension Father   . Hyperlipidemia Sister   . Hypertension Sister   . Colon polyps Sister   . Hyperlipidemia Brother   . Hypertension Brother   . Colon polyps Brother   . Colon cancer Neg Hx      Social History   Socioeconomic History  . Marital status: Married    Spouse name: Not on file  . Number of children: 1  . Years of education: Not on file  . Highest education level: Not on file  Occupational History  . Occupation: part-time hairdresser  Tobacco Use  . Smoking status: Former Smoker    Packs/day: 0.25    Years: 4.00    Pack years: 1.00    Quit date: 10/09/1985    Years since quitting: 34.0  . Smokeless tobacco: Never Used  . Tobacco comment: just in teens  Substance and Sexual Activity  . Alcohol use: No    Alcohol/week: 0.0 standard  drinks  . Drug use: No  . Sexual activity: Not on file  Other Topics Concern  . Not on file  Social History Narrative   Lives in [place] with spouse.  Son is healthy at age 66.   Disabled         Social Determinants of Health   Financial Resource Strain:   . Difficulty of Paying Living Expenses:   Food Insecurity:   . Worried About Programme researcher, broadcasting/film/video in the Last Year:   . Barista in the Last Year:   Transportation Needs:   . Freight forwarder (Medical):   Marland Kitchen Lack of Transportation (Non-Medical):   Physical Activity:   . Days of Exercise per Week:   . Minutes of Exercise per Session:   Stress:   . Feeling of Stress :   Social Connections:   . Frequency of  Communication with Friends and Family:   . Frequency of Social Gatherings with Friends and Family:   . Attends Religious Services:   . Active Member of Clubs or Organizations:   . Attends Banker Meetings:   Marland Kitchen Marital Status:   Intimate Partner Violence:   . Fear of Current or Ex-Partner:   . Emotionally Abused:   Marland Kitchen Physically Abused:   . 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     Allergies  Allergen Reactions  . Gabapentin     SWELLING "OUT OF IT" 12/23/16     Outpatient Medications Prior to Visit  Medication Sig Dispense Refill  . albuterol (PROVENTIL HFA;VENTOLIN HFA) 108 (90 BASE) MCG/ACT inhaler Inhale 2 puffs into the lungs every 6 (six) hours as needed for wheezing or shortness of breath.    Marland Kitchen aspirin EC 81 MG tablet Take 1 tablet (81 mg total) by mouth daily. 90 tablet 3  . Bromfenac Sodium (PROLENSA) 0.07 % SOLN Place 1 drop into both eyes 4 (four) times daily.    . cetirizine (ZYRTEC) 10 MG tablet Take 10 mg by mouth at bedtime as needed for allergies.     Marland Kitchen levothyroxine (SYNTHROID, LEVOTHROID) 112 MCG tablet Take 112 mcg by mouth daily before breakfast.     . metoprolol succinate (TOPROL-XL) 50 MG 24 hr tablet Take 50 mg by mouth daily.    . nitroGLYCERIN (NITROSTAT) 0.4 MG SL tablet Place 1 tablet (0.4 mg total) under the tongue every 5 (five) minutes as needed for chest pain. 25 tablet 3  . pantoprazole (PROTONIX) 40 MG tablet Take 1 tablet (40 mg total) by mouth 2 (two) times daily before a meal. 60 tablet 5  . potassium chloride SA (K-DUR,KLOR-CON) 20 MEQ tablet Take 20 mEq by mouth daily.     . prednisoLONE acetate (PRED FORTE) 1 % ophthalmic suspension Place 1 drop into both eyes 4 (four) times daily.    . rosuvastatin (CRESTOR) 20 MG tablet Take 20 mg by mouth daily.  6  .  sulfamethoxazole-trimethoprim (BACTRIM DS) 800-160 MG tablet Take 1 tablet by mouth 2 (two) times daily.    . valsartan-hydrochlorothiazide (DIOVAN-HCT) 320-25 MG per tablet Take 1 tablet by mouth daily.      Marland Kitchen amLODipine (NORVASC) 10 MG tablet Take 10 mg by mouth daily.     . diclofenac (VOLTAREN) 75 MG EC tablet Take 75 mg by mouth daily  as needed for mild pain.     No facility-administered medications prior to visit.        Objective:   Physical Exam Vitals:   10/18/19 0856  BP: 130/72  Pulse: 72  Temp: 98.5 F (36.9 C)  TempSrc: Temporal  SpO2: 98%  Weight: 200 lb 9.6 oz (91 kg)  Height: 5' 4.5" (1.638 m)   Gen: Pleasant, well-nourished, in no distress,  normal affect  ENT: No lesions,  mouth clear,  oropharynx clear, no postnasal drip  Neck: No JVD, no stridor  Lungs: No use of accessory muscles, no crackles or wheezing on normal respiration, no wheeze on forced expiration  Cardiovascular: RRR, heart sounds normal, no murmur or gallops, no peripheral edema  Musculoskeletal: No deformities, no cyanosis or clubbing  Neuro: alert, awake, non focal  Skin: Warm, no lesions or ras     Assessment & Plan:  Pulmonary nodule 1 cm or greater in diameter Slightly smaller in at least one dimension on her repeat CT scan from April.  Based on this we canceled her navigational bronchoscopy.  We can follow with serial scans, next to be done in 6 months, October 2021.  If there is an interval change or if symptoms evolve then we will reconsider tissue diagnosis.  Baltazar Apo, MD, PhD 10/18/2019, 9:17 AM [facility] [phone] or if no answer [phone]

## 2019-10-18 NOTE — Patient Instructions (Signed)
We will plan to repeat your CT scan of the chest in October 2021 to follow for interval stability. Follow with Dr. Delton Coombes in October after your CT scan to review the results together.  Call with any questions or issues.

## 2019-10-21 NOTE — Progress Notes (Signed)
[facility] Note  10/22/2019     CHIEF COMPLAINT Patient presents for Retina Follow Up   HISTORY OF PRESENT ILLNESS: Sylvia Anthony is a 64 y.o. female who presents to the clinic today for:   HPI    Retina Follow Up    Patient presents with  Other.  In both eyes.  This started months ago.  Severity is moderate.  Duration of 6 weeks.  Since onset it is stable.  I, the attending physician,  performed the HPI with the patient and updated documentation appropriately.          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  No change in [place] OU.  Denies pain, FOL, floaters.  Reports more glare, halos and photophobia OU.  PF and Prolensa QID OU.       Last edited by [name], MD on 10/22/2019  9:18 AM. (History)    pt had an appt with [name] (pulmonologist) and had PET scan and CT scan, was scheduled for bronchoscopy, but was cancelled when CT scan showed mild decrease in nodule size, [name] has elected to observe, pt states [name] is not sure what the nodule is, but stated it was not cancer, bc cancer does not shrink, she will have another CT scan in October, she is still taking po bactrim and reports no problems with it   Referring physician: Glenda Chroman, MD [place],  [place] [address]  HISTORICAL INFORMATION:   Selected notes from the [facility] Referred by [name] for concern of bilateral vitreitis.   CURRENT MEDICATIONS: Current Outpatient Medications (Ophthalmic Drugs)  Medication Sig  . Bromfenac Sodium (PROLENSA) 0.07 % SOLN Place 1 drop into both eyes 4 (four) times daily.  . prednisoLONE acetate (PRED FORTE) 1 % ophthalmic suspension Place 1 drop into both eyes 4 (four) times daily.   No current facility-administered medications for this visit. (Ophthalmic Drugs)   Current Outpatient Medications (Other)  Medication Sig  . albuterol (PROVENTIL HFA;VENTOLIN HFA) 108 (90 BASE) MCG/ACT  inhaler Inhale 2 puffs into the lungs every 6 (six) hours as needed for wheezing or shortness of breath.  Marland Kitchen amLODipine (NORVASC) 10 MG tablet Take 10 mg by mouth daily.   Marland Kitchen aspirin EC 81 MG tablet Take 1 tablet (81 mg total) by mouth daily.  . cetirizine (ZYRTEC) 10 MG tablet Take 10 mg by mouth at bedtime as needed for allergies.   Marland Kitchen diclofenac (VOLTAREN) 75 MG EC tablet Take 75 mg by mouth daily as needed for mild pain.  Marland Kitchen levothyroxine (SYNTHROID, LEVOTHROID) 112 MCG tablet Take 112 mcg by mouth daily before breakfast.   . metoprolol succinate (TOPROL-XL) 50 MG 24 hr tablet Take 50 mg by mouth daily.  . nitroGLYCERIN (NITROSTAT) 0.4 MG SL tablet Place 1 tablet (0.4 mg total) under the tongue every 5 (five) minutes as needed for chest pain.  . pantoprazole (PROTONIX) 40 MG tablet Take 1 tablet (40 mg total) by mouth 2 (two) times daily before a meal.  . potassium chloride SA (K-DUR,KLOR-CON) 20 MEQ tablet Take 20 mEq by mouth daily.   . rosuvastatin (CRESTOR) 20 MG tablet Take 20 mg by mouth daily.  Marland Kitchen sulfamethoxazole-trimethoprim (BACTRIM DS) 800-160 MG tablet Take 1 tablet by mouth 2 (two) times daily.  . valsartan-hydrochlorothiazide (DIOVAN-HCT) 320-25 MG per tablet Take 1 tablet by mouth daily.  No current facility-administered medications for this visit. (Other)      REVIEW OF SYSTEMS: ROS    Positive for: Gastrointestinal, Cardiovascular, Eyes   Negative for: Constitutional, Neurological, Skin, Genitourinary, Musculoskeletal, HENT, Endocrine, Respiratory, Psychiatric, Allergic/Imm, Heme/Lymph   Last edited by [name], COA on 10/22/2019  8:51 AM. (History)       ALLERGIES Allergies  Allergen Reactions  . Gabapentin     SWELLING "OUT OF IT" 12/23/16    PAST MEDICAL HISTORY Past Medical History:  Diagnosis Date  . Anemia   . Arm numbness   . 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 Carotid bruit   . Cataract    Mixed form OU  . CHF (congestive heart  failure) ([place]) 1997   pacemaker  . Fibromyalgia   . GERD (gastroesophageal reflux disease)   . Glucose intolerance (impaired glucose tolerance)   . H. pylori infection Sept 2013   Amoxicillin, Biaxin  . Hyperlipidemia   . Hypertension   . Hypertensive retinopathy    OU  . Hypothyroidism   . Mitral valve prolapse   . Peripheral vascular disease ([place])   . Rheumatic fever    age 87, was at [facility] for 17 weeks  . SSS (sick sinus syndrome) [place])    Past Surgical History:  Procedure Laterality Date  . ABDOMINAL HYSTERECTOMY     partial  . APPENDECTOMY    . CARDIAC CATHETERIZATION     multiple  . CHOLECYSTECTOMY    . COLONOSCOPY  08/01/2006   3 mm descending colon polyp removed/8 mm sessile ascending colon polyp removed / 3-mm rectal  polyp removed /Rare sigmoid diverticulosis/ Moderate internal hemorrhoids.Advanced adenoma on colonoscopy in March 2008.  The polyp was anadenomatous polyp with a foci of high-grade dysplasia  . COLONOSCOPY  09/08/2008   SIMPLE ADENOMA/HYPERPLASTIC POLY/Multiple colon polyps (ascending, sigmoid, rectal)  Mild sigmoid colon diverticulosis./ Small internal hemorrhoids  . COLONOSCOPY N/A 12/11/2017   Dr. Oneida Alar: diverticulosis, hemorrhoids next surveillance tcs 5 years.   . COLONOSCOPY WITH ESOPHAGOGASTRODUODENOSCOPY (EGD)  Sept. 30, 2013   ZOX:WRUE diverticulosis was noted in the sigmoid colon/The colon was otherwise normal/Small internal hemorrhoids/EGD:The mucosa of the esophagus appeared normal/Non-erosive gastritis (inflammation) was found; multiple bx/The duodenal mucosa showed no abnormalities. +H.pylori gastritis, treated with equivalent of prevpac. SAVARY DILATION  . ESOPHAGEAL DILATION N/A 10/07/2014   Procedure: ESOPHAGEAL DILATION;  Surgeon: Danie Binder, MD;  Location: [facility];  Service: Endoscopy;  Laterality: N/A;  . ESOPHAGOGASTRODUODENOSCOPY N/A 10/07/2014   Dr. Oneida Alar: moderate non-erosive gastritis, no definite stricture,  empiric dilation . negative H.pylori   . growth removed from intestine     [facility]-as teenager, done through colonoscopy  . PACEMAKER GENERATOR CHANGE  09/18/2006   generator change by Dr Leonia Reeves with a MDT Adapta L  . PACEMAKER INSERTION  09/13/1995   for sick sinus syndome and syncope at [facility]  . PPM GENERATOR CHANGEOUT N/A 06/11/2018    Medtronic Azure XT DR MRI SureScan model [id number] (serial number [id number]) pacemaker by Dr Rayann Heman  . right Achilles tendon     X 2  . TONSILLECTOMY      FAMILY HISTORY Family History  Problem Relation Age of Onset  . Hyperlipidemia Mother   . Hypertension Mother   . Heart disease Father   . Hypertension Father   . Hyperlipidemia Sister   . Hypertension Sister   . Colon polyps Sister   . Hyperlipidemia Brother   . Hypertension Brother   .  Colon polyps Brother   . Colon cancer Neg Hx     SOCIAL HISTORY Social History   Tobacco Use  . Smoking status: Former Smoker    Packs/day: 0.25    Years: 4.00    Pack years: 1.00    Quit date: 10/09/1985    Years since quitting: 34.0  . Smokeless tobacco: Never Used  . Tobacco comment: just in teens  Substance Use Topics  . Alcohol use: No    Alcohol/week: 0.0 standard drinks  . Drug use: No         OPHTHALMIC EXAM:  Base Eye Exam    Visual Acuity (Snellen - Linear)      Right Left   Dist [place] 20/30 -2 20/25 +2   Dist ph [place] NI NI       Tonometry (Tonopen, 8:53 AM)      Right Left   Pressure 17 18       Pupils      Dark Light Shape React APD   Right 4 3 Round Brisk None   Left 4 3 Round Brisk None       Visual Fields    Counting fingers       Extraocular Movement      Right Left    Full, Ortho Full, Ortho       Neuro/Psych    Oriented x3: Yes   Mood/Affect: Normal       Dilation    Both eyes: 1.0% Mydriacyl, 2.5% Phenylephrine @ 8:53 AM        Slit Lamp and Fundus Exam    Slit Lamp Exam      Right Left   Lids/Lashes Mild Dermatochalasis - upper lid  Mild Dermatochalasis - upper lid   Conjunctiva/Sclera Nasal Pinguecula, mild Melanosis, white and quiet Nasal Pinguecula, mild Melanosis, white and quiet   Cornea Mild Arcus, trace Punctate epithelial erosions Mild Arcus, trace inferior Punctate epithelial erosions   Anterior Chamber deep, narrow temporal angle deep, narrow temporal angle   Iris Round and dilated Round and dilated   Lens 2+ Nuclear sclerosis, 2+ Cortical cataract, trace pigment on anterior capsule 2+ Nuclear sclerosis, 2+ Cortical cataract   Vitreous Vitreous syneresis, mild cell/pigment Vitreous syneresis, mild cell/pigment       Fundus Exam      Right Left   Disc Pink and Sharp Pink and Sharp   C/D Ratio 0.5 0.4   Macula blunted foveal reflex, central CME, No heme  good foveal reflex, stable resolution of CME, mild Retinal pigment epithelial mottling, no heme   Vessels Vascular attenuation, mild Tortuousity, mild AV crossing changes Vascular attenuation, mild Tortuousity, mild AV crossing changes   Periphery Attached, no heme, no snowbanking, cobblestoning inferiorly Attached, no heme, no snowbanking          IMAGING AND PROCEDURES  Imaging and Procedures for @TODAY @  OCT, Retina - OU - Both Eyes       Right Eye Quality was good. Central Foveal Thickness: 333. Progression has worsened. Findings include no SRF, abnormal foveal contour, intraretinal fluid (Recurrence of central CME).   Left Eye Quality was good. Central Foveal Thickness: 260. Progression has been stable. Findings include normal foveal contour, no IRF, no SRF (Stable resolution of CME; normal foveal profile).   Notes *Images captured and stored on drive  Diagnosis / Impression:  NFP, no IRF/SRF OS OD: Recurrence of central CME  OS: Stable resolution of CME; normal foveal profile  Clinical management:  See below  Abbreviations: NFP - Normal foveal profile. CME - cystoid macular edema. PED - pigment epithelial detachment. IRF - intraretinal  fluid. SRF - subretinal fluid. EZ - ellipsoid zone. ERM - epiretinal membrane. ORA - outer retinal atrophy. ORT - outer retinal tubulation. SRHM - subretinal hyper-reflective material        Injection into Tenon's Capsule - OD - Right Eye       Time Out 10/22/2019. 10:05 AM. Confirmed correct patient, procedure, site, and patient consented.   Anesthesia Topical anesthesia was used. Anesthetic medications included Lidocaine 2%, Proparacaine 0.5%.   Procedure Preparation included 5% betadine to ocular surface, eyelid speculum. A 27 gauge needle was used.   Injection:  4 mg KENALOG-13m/ml injection 485m  NDC: 007412-8786-76Lot: [id number] date: 03/25/2020   Route: Other, Site: Right Eye  Post-op Post injection exam found visual acuity of at least counting fingers. The patient tolerated the procedure well. There were no complications. The patient received written and verbal post procedure care education.   Notes 0.8 cc of Kenalog-40 (32 mg) injected into subtenon's capsule in the superotemporal quadrant. Betadine was applied to Injection area pre and post-injection then rinsed with sterile BSS. 1 drop of polymixin was instilled into the eye. There were no complications. Pt tolerated procedure well.                 ASSESSMENT/PLAN:    ICD-10-CM   1. Cystoid macular edema of both eyes  H35.353 Injection into Tenon's Capsule - OD - Right Eye    triamcinolone acetonide (KENALOG-40) injection 4 mg  2. 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 Essential hypertension  I10   6. Hypertensive retinopathy of both eyes  H35.033   7. Combined forms of age-related cataract of both eyes  H25.813   8. Nodule of right lung  R91.1     1-4. Mild Panuveitis w/ CME OU  - pt reports 1+ mo history of floaters and decreased vision OU; +photophobia  - saw Dr. DaRosana [facility]who [facility] and started PF q1h  - initial  exam here today showed +cell/pigment in [facility] vitreous cavity; mild vitreous haze and +central CME OU  - FA (02.15.21) shows central petaloid hyperfluorescence and hyperfluorescence of the optic disc  - pt reports history of fibromyalgia, family history of lupus  - initiated uveitis lab work up -- all WNL except +toxo IgG   CBC, CMP   RPR, VDRL, FTA-Abs, MHA   HIV, Lyme, Quant-Gold   Toxoplasma titers   HLA Panel   ANA   ANCA   ACE, Lysozyme   RF   ESR, CRP   CXR  - chest x-ray without TB or pulmonary sarcoidosis, but did reveal a1.6cm nodular mass in right lung base  - hx of Covid-19 infection ?involvement  - repeat FA (04.05.21) shows interval improvement in perifoveal petaloid leakage OU  - OCT shows OD: recurrence of CME; OS: Stable resolution of CME; normal foveal profile OS  - discussed findings  - IOP okay at 17,18  - increase PF to 6x/day OU; cont Prolensa QID OU  - cont po bactrim DS bid for the prophylactic treatment of Toxoplasmosis -- consider stopping after next visit  - recommend STK OD today, 05.25.21  - pt wishes to proceed  - RBA of procedure discussed, questions answered  - informed consent obtained, signed and  scanned, 05.25.21  - see procedure note  - f/u 4 weeks -- DFE/OCT  5,6. Hypertensive retinopathy OU  - discussed importance of tight BP control  - monitor  7. Mixed form age related cataract OU  - The symptoms of cataract, surgical options, and treatments and risks were discussed with patient.  - discussed diagnosis and progression  - not yet visually significant  - monitor for now  8. 1.6 cm lung nodule of R lung base found on CXR  - discussed with Dr. Woody Seller as above  - PET imaging done on 3.11.21 -- identifying some metabolic lesions (see report below)  - CT chest performed 4.14.21 -- indicating mild dec in some dimensions (see report below)  - pt saw [name] who had originally scheduled a bronchoscopy, but was cancelled following the CT  evidence of decreasing size on CT   Ophthalmic Meds Ordered this visit:  Meds ordered this encounter  Medications  . triamcinolone acetonide (KENALOG-40) injection 4 mg       Return in about 4 weeks (around 11/19/2019) for f/u panuveitis with CME OU, DFE, OCT.  There are no Patient Instructions on file for this visit.   Explained the diagnoses, plan, and follow up with the patient and they expressed understanding.  Patient expressed understanding of the importance of proper follow up care.  This document serves as a record of services personally performed by Gardiner Sleeper, MD, PhD. It was created on their behalf by Estill Bakes, COT an ophthalmic technician. The creation of this record is the provider's dictation and/or activities during the visit.    Electronically signed by: Estill Bakes, COT 10/21/19 @ 12:16 PM  Gardiner Sleeper, M.D., Ph.D. Diseases & Surgery of the Retina and [place] 10/22/2019   I have reviewed the above documentation for accuracy and completeness, and I agree with the above. Gardiner Sleeper, M.D., Ph.D. 10/22/19 12:16 PM    Abbreviations: M myopia (nearsighted); A astigmatism; H hyperopia (farsighted); P presbyopia; Mrx spectacle prescription;  CTL contact lenses; OD right eye; OS left eye; OU both eyes  XT exotropia; ET esotropia; PEK punctate epithelial keratitis; PEE punctate epithelial erosions; DES dry eye syndrome; MGD meibomian gland dysfunction; ATs artificial tears; PFAT's preservative free artificial tears; [place] nuclear sclerotic cataract; PSC posterior subcapsular cataract; ERM epi-retinal membrane; PVD posterior vitreous detachment; RD retinal detachment; DM diabetes mellitus; DR diabetic retinopathy; NPDR non-proliferative diabetic retinopathy; PDR proliferative diabetic retinopathy; CSME clinically significant macular edema; DME diabetic macular edema; dbh dot blot hemorrhages; CWS cotton wool spot; POAG primary  open angle glaucoma; C/D cup-to-disc ratio; HVF humphrey visual field; GVF goldmann visual field; OCT optical coherence tomography; IOP intraocular pressure; BRVO Branch retinal vein occlusion; CRVO central retinal vein occlusion; CRAO central retinal artery occlusion; BRAO branch retinal artery occlusion; RT retinal tear; SB scleral buckle; PPV pars plana vitrectomy; VH Vitreous hemorrhage; PRP panretinal laser photocoagulation; IVK intravitreal kenalog; VMT vitreomacular traction; MH Macular hole;  NVD neovascularization of the disc; NVE neovascularization elsewhere; AREDS age related eye disease study; ARMD age related macular degeneration; POAG primary open angle glaucoma; EBMD epithelial/anterior basement membrane dystrophy; ACIOL anterior chamber intraocular lens; IOL intraocular lens; PCIOL posterior chamber intraocular lens; Phaco/IOL phacoemulsification with intraocular lens placement; PRK photorefractive keratectomy; LASIK laser assisted in situ keratomileusis; HTN hypertension; DM diabetes mellitus; COPD chronic obstructive pulmonary disease   IMAGING:  NM PET Image (3.11.21)  FINDINGS: Mediastinal blood pool activity: SUV max 2.5  Liver activity:  SUV max 3.5  NECK: No hypermetabolic lymph nodes in the neck.  Incidental CT findings: none  CHEST: Within the RIGHT lower lobe, tight cluster of nodules measures 1.4 x 1.3 cm (image 77/4) has mild to moderate metabolic activity for size (SUV max equal 2.9).  There is hypermetabolic activity associated the subcarinal lymph node which is normal size at 9 mm but fairly intense metabolic activity SUV max equal 5.0. More mild activity associated with a partially calcified small RIGHT hilar node with SUV max equal 3.8.  Incidental CT findings: none  ABDOMEN/PELVIS: No abnormal hypermetabolic activity within the liver, pancreas, adrenal glands, or spleen. No hypermetabolic lymph nodes in the abdomen or pelvis.  Incidental CT  findings: none  SKELETON: No focal hypermetabolic activity to suggest skeletal metastasis.  Incidental CT findings: none  IMPRESSION: 1. Mild-to-moderate metabolic activity associated with irregular RIGHT lobe pulmonary nodule. Hypermetabolic subcarinal lymph node. Findings remain indeterminate for inflammatory nodule and lymphadenopathy versus malignancy. There is evidence of granulomatous disease with calcified RIGHT hilar lymph node. Recommend either short-term CT follow-up (3 months) or tissue sampling. 2. No distant metastatic disease.  CT Chest w/o contrast (4.13.21)  FINDINGS: Cardiovascular: Right-sided dual lead pacer device in place, power pack over the left chest. Leads appear in similar position to the prior exam. Heart size mildly enlarged. No pericardial effusion. The calcified atherosclerotic changes of the thoracic aorta without aneurysmal dilation. Main pulmonary artery is mildly dilated.  Mediastinum/Nodes: Stable mildly enlarged subcarinal and right paratracheal lymph nodes just at or less than a cm. No hilar adenopathy. No axillary adenopathy. Thoracic inlet structures are normal.  Lungs/Pleura: Irregular area in the right lower lobe anteriorly, in total measuring approximately 1.7 cm by 1.2 cm. Grouped nodules at the periphery. The dominant area measuring approximately 1.3 cm greatest dimension with extension to the pleural surface along the right lateral chest. The lesion abuts the major fissure. On the sagittal and coronal images the lesion appears to have decreased in size compared to the prior study, dominant area measuring 6-7 mm greatest thickness on sagittal image 44 of series 6 as compared to 9-10 mm on the previous study, image 36 of series 7 on the 07/22/2018 exam.  Axial measurements are provided across the greatest extent of the lesion which appears to be less dense in the axial plane within the central portion of the measured area  across grouped nodules at the periphery and the dominant nodule more centrally.  On coronal image 78 of series 5 the lesion measures 1.2 cm greatest dimension, previously approximately 1.2 cm with respect to the largest area but measured 0.9 cm greatest thickness.  Airways are patent. No consolidation or pleural effusion.  Upper Abdomen: Incidentally imaged upper abdominal contents with changes of cholecystectomy. Upper abdominal contents are otherwise unremarkable.  Musculoskeletal: No chest wall lesion. No acute bone process. No destructive bone finding.  IMPRESSION: 1. Irregular area in the right lower lobe with dominant nodule in small group peripheral nodules raises the question of infectious etiology given the slight interval decrease in some dimensions compared to the prior study potential short interval follow-up could be helpful to track for interval decrease. Neoplasm at this point not entirely excluded. 2. Stable mildly enlarged subcarinal and right paratracheal lymph nodes. Below threshold in terms of nodal enlargement for CT. 3. Aortic atherosclerosis.

## 2019-10-22 ENCOUNTER — Ambulatory Visit (INDEPENDENT_AMBULATORY_CARE_PROVIDER_SITE_OTHER): Payer: Medicare Other | Admitting: Ophthalmology

## 2019-10-22 ENCOUNTER — Encounter (INDEPENDENT_AMBULATORY_CARE_PROVIDER_SITE_OTHER): Payer: Self-pay | Admitting: Ophthalmology

## 2019-10-22 ENCOUNTER — Encounter: Payer: Self-pay | Admitting: Gastroenterology

## 2019-10-22 ENCOUNTER — Other Ambulatory Visit: Payer: Self-pay

## 2019-10-22 DIAGNOSIS — R911 Solitary pulmonary nodule: Secondary | ICD-10-CM

## 2019-10-22 DIAGNOSIS — H44113 Panuveitis, bilateral: Secondary | ICD-10-CM

## 2019-10-22 DIAGNOSIS — H209 Unspecified iridocyclitis: Secondary | ICD-10-CM

## 2019-10-22 DIAGNOSIS — H35353 Cystoid macular degeneration, bilateral: Secondary | ICD-10-CM

## 2019-10-22 DIAGNOSIS — I10 Essential (primary) hypertension: Secondary | ICD-10-CM | POA: Diagnosis not present

## 2019-10-22 DIAGNOSIS — H3581 Retinal edema: Secondary | ICD-10-CM | POA: Diagnosis not present

## 2019-10-22 DIAGNOSIS — H25813 Combined forms of age-related cataract, bilateral: Secondary | ICD-10-CM

## 2019-10-22 DIAGNOSIS — H35033 Hypertensive retinopathy, bilateral: Secondary | ICD-10-CM

## 2019-10-22 MED ORDER — TRIAMCINOLONE ACETONIDE 40 MG/ML IJ SUSP FOR KALEIDOSCOPE
4.0000 mg | INTRAMUSCULAR | Status: AC | PRN
  Administered 2019-10-22: 4 mg

## 2019-11-12 DIAGNOSIS — Z1231 Encounter for screening mammogram for malignant neoplasm of breast: Secondary | ICD-10-CM | POA: Diagnosis not present

## 2019-11-22 ENCOUNTER — Ambulatory Visit (INDEPENDENT_AMBULATORY_CARE_PROVIDER_SITE_OTHER): Payer: Medicare Other | Admitting: Internal Medicine

## 2019-11-22 ENCOUNTER — Encounter: Payer: Self-pay | Admitting: Internal Medicine

## 2019-11-22 ENCOUNTER — Other Ambulatory Visit: Payer: Self-pay

## 2019-11-22 VITALS — BP 150/88 | HR 72 | Ht 64.56 in | Wt 196.6 lb

## 2019-11-22 DIAGNOSIS — I10 Essential (primary) hypertension: Secondary | ICD-10-CM | POA: Diagnosis not present

## 2019-11-22 DIAGNOSIS — I495 Sick sinus syndrome: Secondary | ICD-10-CM | POA: Diagnosis not present

## 2019-11-22 LAB — CUP PACEART INCLINIC DEVICE CHECK
Battery Remaining Longevity: 155 mo
Battery Voltage: 3.04 V
Brady Statistic AP VP Percent: 0.04 %
Brady Statistic AP VS Percent: 79.8 %
Brady Statistic AS VP Percent: 0.01 %
Brady Statistic AS VS Percent: 20.15 %
Brady Statistic RA Percent Paced: 80.02 %
Brady Statistic RV Percent Paced: 0.06 %
Date Time Interrogation Session: 20210625143517
Implantable Lead Implant Date: 19970416
Implantable Lead Implant Date: 19970416
Implantable Lead Location: 753859
Implantable Lead Location: 753860
Implantable Lead Model: 5034
Implantable Lead Model: 5534
Implantable Pulse Generator Implant Date: 20200113
Lead Channel Impedance Value: 646 Ohm
Lead Channel Impedance Value: 684 Ohm
Lead Channel Impedance Value: 684 Ohm
Lead Channel Impedance Value: 760 Ohm
Lead Channel Pacing Threshold Amplitude: 0.5 V
Lead Channel Pacing Threshold Amplitude: 1 V
Lead Channel Pacing Threshold Pulse Width: 0.4 ms
Lead Channel Pacing Threshold Pulse Width: 0.4 ms
Lead Channel Sensing Intrinsic Amplitude: 1.75 mV
Lead Channel Sensing Intrinsic Amplitude: 1.75 mV
Lead Channel Sensing Intrinsic Amplitude: 19.5 mV
Lead Channel Sensing Intrinsic Amplitude: 21 mV
Lead Channel Setting Pacing Amplitude: 1.5 V
Lead Channel Setting Pacing Amplitude: 2.5 V
Lead Channel Setting Pacing Pulse Width: 0.4 ms
Lead Channel Setting Sensing Sensitivity: 2 mV

## 2019-11-22 NOTE — Progress Notes (Signed)
PCP: Glenda Chroman, MD Primary Cardiologist: Dr Tamala Julian Primary EP:  Dr Rayann Heman  Sylvia Anthony is a 64 y.o. female who presents today for routine electrophysiology followup.  Since last being seen in our clinic, the patient reports doing very well.  Today, she denies symptoms of palpitations, chest pain, shortness of breath,  lower extremity edema, dizziness, presyncope, or syncope.  The patient is otherwise without complaint today.   Past Medical History:  Diagnosis Date  . Anemia   . Arm numbness   . 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 Carotid bruit   . Cataract    Mixed form OU  . CHF (congestive heart failure) ([place]) 1997   pacemaker  . Fibromyalgia   . GERD (gastroesophageal reflux disease)   . Glucose intolerance (impaired glucose tolerance)   . H. pylori infection Sept 2013   Amoxicillin, Biaxin  . Hyperlipidemia   . Hypertension   . Hypertensive retinopathy    OU  . Hypothyroidism   . Mitral valve prolapse   . Peripheral vascular disease ([place])   . Rheumatic fever    age 43, was at [facility] for 17 weeks  . SSS (sick sinus syndrome) [facility])    Past Surgical History:  Procedure Laterality Date  . ABDOMINAL HYSTERECTOMY     partial  . APPENDECTOMY    . CARDIAC CATHETERIZATION     multiple  . CHOLECYSTECTOMY    . COLONOSCOPY  08/01/2006   3 mm descending colon polyp removed/8 mm sessile ascending colon polyp removed / 3-mm rectal  polyp removed /Rare sigmoid diverticulosis/ Moderate internal hemorrhoids.Advanced adenoma on colonoscopy in March 2008.  The polyp was anadenomatous polyp with a foci of high-grade dysplasia  . COLONOSCOPY  09/08/2008   SIMPLE ADENOMA/HYPERPLASTIC POLY/Multiple colon polyps (ascending, sigmoid, rectal)  Mild sigmoid colon diverticulosis./ Small internal hemorrhoids  . COLONOSCOPY N/A 12/11/2017   Dr. Oneida Alar: diverticulosis, hemorrhoids next surveillance tcs 5 years.   . COLONOSCOPY WITH ESOPHAGOGASTRODUODENOSCOPY (EGD)   Sept. 30, 2013   ZOX:WRUE diverticulosis was noted in the sigmoid colon/The colon was otherwise normal/Small internal hemorrhoids/EGD:The mucosa of the esophagus appeared normal/Non-erosive gastritis (inflammation) was found; multiple bx/The duodenal mucosa showed no abnormalities. +H.pylori gastritis, treated with equivalent of prevpac. SAVARY DILATION  . ESOPHAGEAL DILATION N/A 10/07/2014   Procedure: ESOPHAGEAL DILATION;  Surgeon: Danie Binder, MD;  Location: [facility];  Service: Endoscopy;  Laterality: N/A;  . ESOPHAGOGASTRODUODENOSCOPY N/A 10/07/2014   Dr. Oneida Alar: moderate non-erosive gastritis, no definite stricture, empiric dilation . negative H.pylori   . growth removed from intestine     [facility]-as teenager, done through colonoscopy  . PACEMAKER GENERATOR CHANGE  09/18/2006   generator change by Dr Leonia Reeves with a MDT Adapta L  . PACEMAKER INSERTION  09/13/1995   for sick sinus syndome and syncope at [facility]  . PPM GENERATOR CHANGEOUT N/A 06/11/2018    Medtronic Azure XT DR MRI SureScan model [id number] (serial number [id number]) pacemaker by Dr Rayann Heman  . right Achilles tendon     X 2  . TONSILLECTOMY      ROS- all systems are reviewed and negative except as per HPI above  Current Outpatient Medications  Medication Sig Dispense Refill  . albuterol (PROVENTIL HFA;VENTOLIN HFA) 108 (90 BASE) MCG/ACT inhaler Inhale 2 puffs into the lungs every 6 (six) hours as needed for wheezing or shortness of breath.    Marland Kitchen amLODipine (NORVASC) 10 MG tablet Take 10 mg by mouth daily.     Marland Kitchen  aspirin EC 81 MG tablet Take 1 tablet (81 mg total) by mouth daily. 90 tablet 3  . Bromfenac Sodium (PROLENSA) 0.07 % SOLN Place 1 drop into both eyes 4 (four) times daily.    . cetirizine (ZYRTEC) 10 MG tablet Take 10 mg by mouth at bedtime as needed for allergies.     Marland Kitchen diclofenac (VOLTAREN) 75 MG EC tablet Take 75 mg by mouth daily as needed for mild pain.    Marland Kitchen levothyroxine (SYNTHROID, LEVOTHROID) 112  MCG tablet Take 112 mcg by mouth daily before breakfast.     . metoprolol succinate (TOPROL-XL) 50 MG 24 hr tablet Take 50 mg by mouth daily.    . nitroGLYCERIN (NITROSTAT) 0.4 MG SL tablet Place 1 tablet (0.4 mg total) under the tongue every 5 (five) minutes as needed for chest pain. 25 tablet 3  . pantoprazole (PROTONIX) 40 MG tablet Take 1 tablet (40 mg total) by mouth 2 (two) times daily before a meal. 60 tablet 5  . potassium chloride SA (K-DUR,KLOR-CON) 20 MEQ tablet Take 20 mEq by mouth daily.     . prednisoLONE acetate (PRED FORTE) 1 % ophthalmic suspension Place 1 drop into both eyes 4 (four) times daily.    . rosuvastatin (CRESTOR) 20 MG tablet Take 20 mg by mouth daily.  6  . sulfamethoxazole-trimethoprim (BACTRIM DS) 800-160 MG tablet Take 1 tablet by mouth 2 (two) times daily.    . valsartan-hydrochlorothiazide (DIOVAN-HCT) 320-25 MG per tablet Take 1 tablet by mouth daily.       No current facility-administered medications for this visit.    Physical Exam: Vitals:   11/22/19 1412  BP: (!) 150/88  Pulse: 72  Weight: 196 lb 9.6 oz (89.2 kg)  Height: 5' 4.56" (1.64 m)    GEN- The patient is well appearing, alert and oriented x 3 today.   Head- normocephalic, atraumatic Eyes-  Sclera clear, conjunctiva pink Ears- hearing intact Oropharynx- clear Lungs- Clear to ausculation bilaterally, normal work of breathing Chest- pacemaker pocket is well healed Heart- Regular rate and rhythm, no murmurs, rubs or gallops, PMI not laterally displaced GI- soft, NT, ND, + BS Extremities- no clubbing, cyanosis, or edema  Pacemaker interrogation- reviewed in detail today,  See PACEART report  ekg tracing ordered today is personally reviewed and shows sinus  Assessment and Plan:  1. Symptomatic sinus bradycardia   Normal pacemaker function See Pace Art report No changes today she is not device dependant today  2. HTN Elevated today,  She reports good BP control at home No change  required today  Return in a year  Hillis Range MD, [facility] 11/22/2019 2:30 PM

## 2019-11-22 NOTE — Patient Instructions (Signed)
Your physician wants you to follow-up in: 1 YEAR WITH DR Johney Frame   Your physician recommends that you continue on your current medications as directed. Please refer to the Current Medication list given to you today.  Thank you for choosing [facility]!!

## 2019-11-27 ENCOUNTER — Encounter (INDEPENDENT_AMBULATORY_CARE_PROVIDER_SITE_OTHER): Payer: Medicare Other | Admitting: Ophthalmology

## 2019-12-03 ENCOUNTER — Encounter (INDEPENDENT_AMBULATORY_CARE_PROVIDER_SITE_OTHER): Payer: Self-pay | Admitting: Ophthalmology

## 2019-12-03 ENCOUNTER — Other Ambulatory Visit: Payer: Self-pay

## 2019-12-03 ENCOUNTER — Ambulatory Visit (INDEPENDENT_AMBULATORY_CARE_PROVIDER_SITE_OTHER): Payer: Medicare Other | Admitting: Ophthalmology

## 2019-12-03 ENCOUNTER — Encounter (INDEPENDENT_AMBULATORY_CARE_PROVIDER_SITE_OTHER): Payer: Medicare Other | Admitting: Ophthalmology

## 2019-12-03 DIAGNOSIS — H3581 Retinal edema: Secondary | ICD-10-CM | POA: Diagnosis not present

## 2019-12-03 DIAGNOSIS — H209 Unspecified iridocyclitis: Secondary | ICD-10-CM | POA: Diagnosis not present

## 2019-12-03 DIAGNOSIS — H35353 Cystoid macular degeneration, bilateral: Secondary | ICD-10-CM

## 2019-12-03 DIAGNOSIS — R911 Solitary pulmonary nodule: Secondary | ICD-10-CM

## 2019-12-03 DIAGNOSIS — H40053 Ocular hypertension, bilateral: Secondary | ICD-10-CM

## 2019-12-03 DIAGNOSIS — H35033 Hypertensive retinopathy, bilateral: Secondary | ICD-10-CM

## 2019-12-03 DIAGNOSIS — I10 Essential (primary) hypertension: Secondary | ICD-10-CM

## 2019-12-03 DIAGNOSIS — H44113 Panuveitis, bilateral: Secondary | ICD-10-CM | POA: Diagnosis not present

## 2019-12-03 DIAGNOSIS — H25813 Combined forms of age-related cataract, bilateral: Secondary | ICD-10-CM

## 2019-12-03 NOTE — Progress Notes (Signed)
[facility] Note  12/03/2019     CHIEF COMPLAINT Patient presents for Retina Follow Up   HISTORY OF PRESENT ILLNESS: Sylvia Anthony is a 64 y.o. female who presents to the clinic today for:   HPI    Retina Follow Up    Patient presents with  Other.  In both eyes.  This started 4 weeks ago.  Severity is moderate.  I, the attending physician,  performed the HPI with the patient and updated documentation appropriately.          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 Patient states vision doing better. No eye pain. Using drops Pred forte QID OU and Prolensa QID OU.       Last edited by [name], MD on 12/03/2019  3:59 PM. (History)    pt states vision is doing "a whole lot better", she states no new news on her lung nodule, but she goes back to the dr later this month to have it re-scanned  Referring physician: Glenda Chroman, MD [place],  [place] [id number]  HISTORICAL INFORMATION:   Selected notes from the [facility] Referred by [name] for concern of bilateral vitreitis.   CURRENT MEDICATIONS: Current Outpatient Medications (Ophthalmic Drugs)  Medication Sig   Bromfenac Sodium (PROLENSA) 0.07 % SOLN Place 1 drop into both eyes 4 (four) times daily.   prednisoLONE acetate (PRED FORTE) 1 % ophthalmic suspension Place 1 drop into both eyes 4 (four) times daily.   No current facility-administered medications for this visit. (Ophthalmic Drugs)   Current Outpatient Medications (Other)  Medication Sig   albuterol (PROVENTIL HFA;VENTOLIN HFA) 108 (90 BASE) MCG/ACT inhaler Inhale 2 puffs into the lungs every 6 (six) hours as needed for wheezing or shortness of breath.   amLODipine (NORVASC) 10 MG tablet Take 10 mg by mouth daily.    aspirin EC 81 MG tablet Take 1 tablet (81 mg total) by mouth daily.   cetirizine (ZYRTEC) 10 MG tablet Take 10 mg by mouth at bedtime as needed for  allergies.    diclofenac (VOLTAREN) 75 MG EC tablet Take 75 mg by mouth daily as needed for mild pain.   levothyroxine (SYNTHROID, LEVOTHROID) 112 MCG tablet Take 112 mcg by mouth daily before breakfast.    metoprolol succinate (TOPROL-XL) 50 MG 24 hr tablet Take 50 mg by mouth daily.   nitroGLYCERIN (NITROSTAT) 0.4 MG SL tablet Place 1 tablet (0.4 mg total) under the tongue every 5 (five) minutes as needed for chest pain.   pantoprazole (PROTONIX) 40 MG tablet Take 1 tablet (40 mg total) by mouth 2 (two) times daily before a meal.   potassium chloride SA (K-DUR,KLOR-CON) 20 MEQ tablet Take 20 mEq by mouth daily.    rosuvastatin (CRESTOR) 20 MG tablet Take 20 mg by mouth daily.   sulfamethoxazole-trimethoprim (BACTRIM DS) 800-160 MG tablet Take 1 tablet by mouth 2 (two) times daily.   valsartan-hydrochlorothiazide (DIOVAN-HCT) 320-25 MG per tablet Take 1 tablet by mouth daily.     No current facility-administered medications for this visit. (Other)      REVIEW OF SYSTEMS: ROS    Positive for: Gastrointestinal, Cardiovascular, Eyes   Negative for: Constitutional, Neurological, Skin, Genitourinary, Musculoskeletal, HENT, Endocrine, Respiratory, Psychiatric, Allergic/Imm, Heme/Lymph   Last edited by Theodore Demark, COA on 12/03/2019  3:40 PM. (History)       ALLERGIES Allergies  Allergen Reactions   Gabapentin     SWELLING "OUT OF IT" 12/23/16    PAST MEDICAL HISTORY Past Medical History:  Diagnosis Date   Anemia    Arm numbness    Arterial fibromuscular dysplasia (HCC)    Carotid bruit    Cataract    Mixed form OU   CHF (congestive heart failure) ([place]) 1997   pacemaker   Fibromyalgia    GERD (gastroesophageal reflux disease)    Glucose intolerance (impaired glucose tolerance)    H. pylori infection Sept 2013   Amoxicillin, Biaxin   Hyperlipidemia    Hypertension    Hypertensive retinopathy    OU   Hypothyroidism    Mitral valve prolapse     Peripheral vascular disease (HCC)    Rheumatic fever    age 33, was at [facility] for 17 weeks   SSS (sick sinus syndrome) [facility])    Past Surgical History:  Procedure Laterality Date   ABDOMINAL HYSTERECTOMY     partial   APPENDECTOMY     CARDIAC CATHETERIZATION     multiple   CHOLECYSTECTOMY     COLONOSCOPY  08/01/2006   3 mm descending colon polyp removed/8 mm sessile ascending colon polyp removed / 3-mm rectal  polyp removed /Rare sigmoid diverticulosis/ Moderate internal hemorrhoids.Advanced adenoma on colonoscopy in March 2008.  The polyp was anadenomatous polyp with a foci of high-grade dysplasia   COLONOSCOPY  09/08/2008   SIMPLE ADENOMA/HYPERPLASTIC POLY/Multiple colon polyps (ascending, sigmoid, rectal)  Mild sigmoid colon diverticulosis./ Small internal hemorrhoids   COLONOSCOPY N/A 12/11/2017   Dr. Oneida Alar: diverticulosis, hemorrhoids next surveillance tcs 5 years.    COLONOSCOPY WITH ESOPHAGOGASTRODUODENOSCOPY (EGD)  Sept. 30, 2013   OBS:JGGE diverticulosis was noted in the sigmoid colon/The colon was otherwise normal/Small internal hemorrhoids/EGD:The mucosa of the esophagus appeared normal/Non-erosive gastritis (inflammation) was found; multiple bx/The duodenal mucosa showed no abnormalities. +H.pylori gastritis, treated with equivalent of prevpac. SAVARY DILATION   ESOPHAGEAL DILATION N/A 10/07/2014   Procedure: ESOPHAGEAL DILATION;  Surgeon: Danie Binder, MD;  Location: [facility];  Service: Endoscopy;  Laterality: N/A;   ESOPHAGOGASTRODUODENOSCOPY N/A 10/07/2014   Dr. Oneida Alar: moderate non-erosive gastritis, no definite stricture, empiric dilation . negative H.pylori    growth removed from intestine     [facility]-as teenager, done through colonoscopy   [place]  09/18/2006   generator change by Dr Leonia Reeves with a MDT [place]  09/13/1995   for sick sinus syndome and syncope at [place] N/A 06/11/2018    Medtronic Azure XT DR MRI SureScan model [id number] (serial number [id number]) pacemaker by Dr Rayann Heman   right Achilles tendon     X 2   TONSILLECTOMY      FAMILY HISTORY Family History  Problem Relation Age of Onset   Hyperlipidemia Mother    Hypertension Mother    Heart disease Father    Hypertension Father    Hyperlipidemia Sister    Hypertension Sister    Colon polyps Sister    Hyperlipidemia Brother    Hypertension Brother    Colon polyps Brother    Colon cancer Neg Hx     SOCIAL HISTORY Social History   Tobacco Use   Smoking status: Former Smoker    Packs/day: 0.25    Years: 4.00    Pack years: 1.00    Quit date: 10/09/1985    Years since quitting: 34.1   Smokeless tobacco:  Never Used   Tobacco comment: just in teens  Substance Use Topics   Alcohol use: No    Alcohol/week: 0.0 standard drinks   Drug use: No         OPHTHALMIC EXAM:  Base Eye Exam    Visual Acuity (Snellen - Linear)      Right Left   Dist [place] 20/30 -1 20/30 +1   Dist ph [place] NI 20/25       Tonometry (Tonopen, 3:37 PM)      Right Left   Pressure 32 27       Pupils      Dark Light Shape React APD   Right 4 3 Round Brisk None   Left 4 3 Round Brisk None       Visual Fields (Counting fingers)      Left Right    Full Full       Extraocular Movement      Right Left    Full, Ortho Full, Ortho       Neuro/Psych    Oriented x3: Yes   Mood/Affect: Normal       Dilation    Both eyes: 1.0% Mydriacyl, 2.5% Phenylephrine @ 3:36 PM        Slit Lamp and Fundus Exam    Slit Lamp Exam      Right Left   Lids/Lashes Mild Dermatochalasis - upper lid Mild Dermatochalasis - upper lid   Conjunctiva/Sclera Nasal Pinguecula, mild Melanosis, white and quiet Nasal Pinguecula, mild Melanosis, white and quiet   Cornea Mild Arcus, 1+ Punctate epithelial erosions Mild Arcus, 1-2+ inferior Punctate epithelial erosions   Anterior Chamber deep, narrow  temporal angle, 0.5+pigment deep, narrow temporal angle, No cell or flare   Iris Round and dilated Round and dilated   Lens 2+ Nuclear sclerosis, 2+ Cortical cataract, trace pigment on anterior capsule 2+ Nuclear sclerosis, 2+ Cortical cataract   Vitreous Vitreous syneresis, trace cell/pigment Vitreous syneresis, mild cell/pigment       Fundus Exam      Right Left   Disc Pink and Sharp Pink and Sharp   C/D Ratio 0.5 0.4   Macula Flat, good foveal reflex, central CME - improved, RPE mottling and clumping, no heme Flat, good foveal reflex, stable resolution of CME, mild Retinal pigment epithelial mottling, no heme or edema   Vessels Vascular attenuation, mild Tortuousity, mild AV crossing changes Vascular attenuation, mild Tortuousity, mild AV crossing changes   Periphery Attached, no heme, no snowbanking, cobblestoning inferiorly Attached, no heme, no snowbanking          IMAGING AND PROCEDURES  Imaging and Procedures for @TODAY @  OCT, Retina - OU - Both Eyes       Right Eye Quality was good. Central Foveal Thickness: 260. Progression has improved. Findings include no SRF, no IRF, normal foveal contour (Interval improvement of IRF/CME).   Left Eye Quality was good. Central Foveal Thickness: 260. Progression has been stable. Findings include normal foveal contour, no IRF, no SRF (Stable resolution of CME; normal foveal profile).   Notes *Images captured and stored on drive  Diagnosis / Impression:  NFP, no IRF/SRF OS OD: Interval improvement of IRF/CME -- resolved OS: Stable resolution of CME; normal foveal profile  Clinical management:  See below  Abbreviations: NFP - Normal foveal profile. CME - cystoid macular edema. PED - pigment epithelial detachment. IRF - intraretinal fluid. SRF - subretinal fluid. EZ - ellipsoid zone. ERM - epiretinal membrane. ORA - outer retinal  atrophy. ORT - outer retinal tubulation. 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 Cystoid macular edema of both eyes  H35.353   3. Retinal edema  H35.81 OCT, Retina - OU - Both Eyes  4. 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 Essential hypertension  I10   6. Hypertensive retinopathy of both eyes  H35.033   7. Combined forms of age-related cataract of both eyes  H25.813   8. Nodule of right lung  R91.1   9. 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 Mild Panuveitis w/ CME OU  - pt reports 1+ mo history of floaters and decreased vision OU; +photophobia  - saw [name], who noted [facility] cell and started PF q1h  - initial exam here showed +cell/pigment in [facility] and vitreous cavity; mild vitreous haze and +central CME OU  - FA (02.15.21) shows central petaloid hyperfluorescence and hyperfluorescence of the optic disc  - s/p STK OD (05.25.21)  - pt reports history of fibromyalgia, family history of lupus  - initiated uveitis lab work up -- all WNL except +toxo IgG   CBC, CMP   RPR, VDRL, FTA-Abs, MHA   HIV, Lyme, Quant-Gold   Toxoplasma titers   HLA Panel   ANA   ANCA   ACE, Lysozyme   RF   ESR, CRP   CXR  - chest x-ray without TB or pulmonary sarcoidosis, but did reveal a 1.6cm nodular mass in right lung base  - hx of Covid-19 infection ?involvement  - repeat FA (04.05.21) shows interval improvement in perifoveal petaloid leakage OU  - OCT shows OD: interval resolution of IRF/CME; OS: Stable resolution of CME; normal foveal profile OS  - discussed findings  - IOP increased today 32,27 -- likely steroid response  - start PF taper -- 4,3,2 drops daily, decrease every 2 weeks; decrease Prolensa to BID; start Cosopt BID OU  - okay to stop po bactrim DS bid for the prophylactic treatment of Toxoplasmosis  - s/p STK OD #1 (05.25.21)  - informed consent obtained, signed and scanned, 05.25.21  - see procedure note  - f/u 6 weeks -- DFE/OCT/FA (optos, transit OD)  5,6. Hypertensive retinopathy OU  -  discussed importance of tight BP control  - monitor  7. Mixed form age related cataract OU  - The symptoms of cataract, surgical options, and treatments and risks were discussed with patient.  - discussed diagnosis and progression  - not yet visually significant  - monitor for now  8. 1.6 cm lung nodule of R lung base found on CXR  - discussed with Dr. Woody Seller as above  - PET imaging done on 3.11.21 -- identifying some metabolic lesions (see report below)  - CT chest performed 4.14.21 -- indicating mild dec in some dimensions (see report below)  - pt saw [name] who had originally scheduled a bronchoscopy, but was cancelled following the CT evidence of decreasing size on CT  9. Ocular Hypertension OU  - IOP 32,27  - likely steroid response - will start Cosopt BID OU as she tapers off PF   Ophthalmic Meds Ordered this visit:  No orders of the defined types were placed in this encounter.      Return in about 6 weeks (around 01/14/2020) for f/u CME OU -- Dilated Exam, OCT, FA (  optos, transit OD).  There are no Patient Instructions on file for this visit.   Explained the diagnoses, plan, and follow up with the patient and they expressed understanding.  Patient expressed understanding of the importance of proper follow up care.  This document serves as a record of services personally performed by Gardiner Sleeper, MD, PhD. It was created on their behalf by Roselee Nova, COMT. The creation of this record is the provider's dictation and/or activities during the visit.  Electronically signed by: Roselee Nova, COMT 12/03/19 11:25 PM   This document serves as a record of services personally performed by Gardiner Sleeper, MD, PhD. It was created on their behalf by San Jetty. Owens Shark, COT, an ophthalmic technician. The creation of this record is the provider's dictation and/or activities during the visit.    Electronically signed by: San Jetty. Owens Shark, [place] 07.06.2021 11:25 PM   Gardiner Sleeper,  M.D., Ph.D. Diseases & Surgery of the Retina and Vitreous [facility]  I have reviewed the above documentation for accuracy and completeness, and I agree with the above. Gardiner Sleeper, M.D., Ph.D. 12/03/19 11:25 PM   Abbreviations: M myopia (nearsighted); A astigmatism; H hyperopia (farsighted); P presbyopia; Mrx spectacle prescription;  CTL contact lenses; OD right eye; OS left eye; OU both eyes  XT exotropia; ET esotropia; PEK punctate epithelial keratitis; PEE punctate epithelial erosions; DES dry eye syndrome; MGD meibomian gland dysfunction; ATs artificial tears; PFAT's preservative free artificial tears; [place] nuclear sclerotic cataract; PSC posterior subcapsular cataract; ERM epi-retinal membrane; PVD posterior vitreous detachment; RD retinal detachment; DM diabetes mellitus; DR diabetic retinopathy; NPDR non-proliferative diabetic retinopathy; PDR proliferative diabetic retinopathy; CSME clinically significant macular edema; DME diabetic macular edema; dbh dot blot hemorrhages; CWS cotton wool spot; POAG primary open angle glaucoma; C/D cup-to-disc ratio; HVF humphrey visual field; GVF goldmann visual field; OCT optical coherence tomography; IOP intraocular pressure; BRVO Branch retinal vein occlusion; CRVO central retinal vein occlusion; CRAO central retinal artery occlusion; BRAO branch retinal artery occlusion; RT retinal tear; SB scleral buckle; PPV pars plana vitrectomy; VH Vitreous hemorrhage; PRP panretinal laser photocoagulation; IVK intravitreal kenalog; VMT vitreomacular traction; MH Macular hole;  NVD neovascularization of the disc; NVE neovascularization elsewhere; AREDS age related eye disease study; ARMD age related macular degeneration; POAG primary open angle glaucoma; EBMD epithelial/anterior basement membrane dystrophy; ACIOL anterior chamber intraocular lens; IOL intraocular lens; PCIOL posterior chamber intraocular lens; Phaco/IOL phacoemulsification with  intraocular lens placement; PRK photorefractive keratectomy; LASIK laser assisted in situ keratomileusis; HTN hypertension; DM diabetes mellitus; COPD chronic obstructive pulmonary disease   IMAGING:  NM PET Image (3.11.21)  FINDINGS: Mediastinal blood pool activity: SUV max 2.5  Liver activity: SUV max 3.5  NECK: No hypermetabolic lymph nodes in the neck.  Incidental CT findings: none  CHEST: Within the RIGHT lower lobe, tight cluster of nodules measures 1.4 x 1.3 cm (image 77/4) has mild to moderate metabolic activity for size (SUV max equal 2.9).  There is hypermetabolic activity associated the subcarinal lymph node which is normal size at 9 mm but fairly intense metabolic activity SUV max equal 5.0. More mild activity associated with a partially calcified small RIGHT hilar node with SUV max equal 3.8.  Incidental CT findings: none  ABDOMEN/PELVIS: No abnormal hypermetabolic activity within the liver, pancreas, adrenal glands, or spleen. No hypermetabolic lymph nodes in the abdomen or pelvis.  Incidental CT findings: none  SKELETON: No focal hypermetabolic activity to suggest skeletal metastasis.  Incidental CT  findings: none  IMPRESSION: 1. Mild-to-moderate metabolic activity associated with irregular RIGHT lobe pulmonary nodule. Hypermetabolic subcarinal lymph node. Findings remain indeterminate for inflammatory nodule and lymphadenopathy versus malignancy. There is evidence of granulomatous disease with calcified RIGHT hilar lymph node. Recommend either short-term CT follow-up (3 months) or tissue sampling. 2. No distant metastatic disease.  CT Chest w/o contrast (4.13.21)  FINDINGS: Cardiovascular: Right-sided dual lead pacer device in place, power pack over the left chest. Leads appear in similar position to the prior exam. Heart size mildly enlarged. No pericardial effusion. The calcified atherosclerotic changes of the thoracic aorta  without aneurysmal dilation. Main pulmonary artery is mildly dilated.  Mediastinum/Nodes: Stable mildly enlarged subcarinal and right paratracheal lymph nodes just at or less than a cm. No hilar adenopathy. No axillary adenopathy. Thoracic inlet structures are normal.  Lungs/Pleura: Irregular area in the right lower lobe anteriorly, in total measuring approximately 1.7 cm by 1.2 cm. Grouped nodules at the periphery. The dominant area measuring approximately 1.3 cm greatest dimension with extension to the pleural surface along the right lateral chest. The lesion abuts the major fissure. On the sagittal and coronal images the lesion appears to have decreased in size compared to the prior study, dominant area measuring 6-7 mm greatest thickness on sagittal image 44 of series 6 as compared to 9-10 mm on the previous study, image 36 of series 7 on the 07/22/2018 exam.  Axial measurements are provided across the greatest extent of the lesion which appears to be less dense in the axial plane within the central portion of the measured area across grouped nodules at the periphery and the dominant nodule more centrally.  On coronal image 78 of series 5 the lesion measures 1.2 cm greatest dimension, previously approximately 1.2 cm with respect to the largest area but measured 0.9 cm greatest thickness.  Airways are patent. No consolidation or pleural effusion.  Upper Abdomen: Incidentally imaged upper abdominal contents with changes of cholecystectomy. Upper abdominal contents are otherwise unremarkable.  Musculoskeletal: No chest wall lesion. No acute bone process. No destructive bone finding.  IMPRESSION: 1. Irregular area in the right lower lobe with dominant nodule in small group peripheral nodules raises the question of infectious etiology given the slight interval decrease in some dimensions compared to the prior study potential short interval follow-up could be helpful  to track for interval decrease. Neoplasm at this point not entirely excluded. 2. Stable mildly enlarged subcarinal and right paratracheal lymph nodes. Below threshold in terms of nodal enlargement for CT. 3. Aortic atherosclerosis.

## 2019-12-11 ENCOUNTER — Ambulatory Visit (INDEPENDENT_AMBULATORY_CARE_PROVIDER_SITE_OTHER): Payer: Medicare Other | Admitting: *Deleted

## 2019-12-11 DIAGNOSIS — I495 Sick sinus syndrome: Secondary | ICD-10-CM

## 2019-12-11 LAB — CUP PACEART REMOTE DEVICE CHECK
Battery Remaining Longevity: 155 mo
Battery Voltage: 3.04 V
Brady Statistic AP VP Percent: 0.04 %
Brady Statistic AP VS Percent: 86.59 %
Brady Statistic AS VP Percent: 0.01 %
Brady Statistic AS VS Percent: 13.37 %
Brady Statistic RA Percent Paced: 86.84 %
Brady Statistic RV Percent Paced: 0.05 %
Date Time Interrogation Session: 20210713223716
Implantable Lead Implant Date: 19970416
Implantable Lead Implant Date: 19970416
Implantable Lead Location: 753859
Implantable Lead Location: 753860
Implantable Lead Model: 5034
Implantable Lead Model: 5534
Implantable Pulse Generator Implant Date: 20200113
Lead Channel Impedance Value: 684 Ohm
Lead Channel Impedance Value: 703 Ohm
Lead Channel Impedance Value: 703 Ohm
Lead Channel Impedance Value: 760 Ohm
Lead Channel Pacing Threshold Amplitude: 0.625 V
Lead Channel Pacing Threshold Amplitude: 0.75 V
Lead Channel Pacing Threshold Pulse Width: 0.4 ms
Lead Channel Pacing Threshold Pulse Width: 0.4 ms
Lead Channel Sensing Intrinsic Amplitude: 1.75 mV
Lead Channel Sensing Intrinsic Amplitude: 1.75 mV
Lead Channel Sensing Intrinsic Amplitude: 18.25 mV
Lead Channel Sensing Intrinsic Amplitude: 18.25 mV
Lead Channel Setting Pacing Amplitude: 1.5 V
Lead Channel Setting Pacing Amplitude: 2.5 V
Lead Channel Setting Pacing Pulse Width: 0.4 ms
Lead Channel Setting Sensing Sensitivity: 2 mV

## 2019-12-12 NOTE — Progress Notes (Signed)
Remote pacemaker transmission.   

## 2019-12-16 ENCOUNTER — Other Ambulatory Visit: Payer: Self-pay

## 2019-12-16 ENCOUNTER — Ambulatory Visit (HOSPITAL_COMMUNITY)
Admission: RE | Admit: 2019-12-16 | Discharge: 2019-12-16 | Disposition: A | Discharge status: Home / Self Care | Payer: Medicare Other | Source: Ambulatory Visit | Attending: Emergency Medicine | Admitting: Emergency Medicine

## 2019-12-16 DIAGNOSIS — J984 Other disorders of lung: Secondary | ICD-10-CM | POA: Diagnosis not present

## 2019-12-16 DIAGNOSIS — I313 Pericardial effusion (noninflammatory): Secondary | ICD-10-CM | POA: Diagnosis not present

## 2019-12-16 DIAGNOSIS — I700 Atherosclerosis of aorta: Secondary | ICD-10-CM | POA: Diagnosis not present

## 2019-12-16 DIAGNOSIS — R911 Solitary pulmonary nodule: Secondary | ICD-10-CM | POA: Diagnosis not present

## 2019-12-16 DIAGNOSIS — J9811 Atelectasis: Secondary | ICD-10-CM | POA: Diagnosis not present

## 2019-12-17 ENCOUNTER — Ambulatory Visit: Payer: Medicare Other | Admitting: Gastroenterology

## 2019-12-17 ENCOUNTER — Encounter: Payer: Self-pay | Admitting: Gastroenterology

## 2019-12-17 VITALS — BP 158/96 | HR 84 | Temp 98.0°F | Ht 65.0 in | Wt 199.6 lb

## 2019-12-17 DIAGNOSIS — K219 Gastro-esophageal reflux disease without esophagitis: Secondary | ICD-10-CM | POA: Diagnosis not present

## 2019-12-17 NOTE — Patient Instructions (Addendum)
1. Continue pantoprazole 40mg  daily before breakfast.  2. Keep a log of blood pressures in both arms and show Dr. at upcoming appointment.  3. Return to the office in one year or sooner if needed.

## 2019-12-17 NOTE — Progress Notes (Signed)
Primary Care Physician: Ignatius Specking, MD  Primary Gastroenterologist:  Formerly Jonette Eva, MD   Chief Complaint  Patient presents with  . Gastroesophageal Reflux    doing ok    HPI: Sylvia Anthony is a 64 y.o. female here for follow-up.  Last seen July 2020.  She has a history of GERD, colon polyps, constipation.  Last colonoscopy July 2019, diverticulosis, hemorrhoids.  Next colonoscopy planned in 5 years for history of colon polyps.  She had Covid in 03/2019.  Taking pantoprazole once daily. Reflux well controlled. Starting to have some dysphagia to solids, couple of times per month. Not ready to have esophagus stretched. BM regular. No melena, brbpr. No abdominal pain.       Current Outpatient Medications  Medication Sig Dispense Refill  . albuterol (PROVENTIL HFA;VENTOLIN HFA) 108 (90 BASE) MCG/ACT inhaler Inhale 2 puffs into the lungs every 6 (six) hours as needed for wheezing or shortness of breath.    Marland Kitchen aspirin EC 81 MG tablet Take 1 tablet (81 mg total) by mouth daily. 90 tablet 3  . Bromfenac Sodium (PROLENSA) 0.07 % SOLN Place 1 drop into both eyes 4 (four) times daily.    . cetirizine (ZYRTEC) 10 MG tablet Take 10 mg by mouth at bedtime as needed for allergies.     Marland Kitchen diclofenac (VOLTAREN) 75 MG EC tablet Take 75 mg by mouth daily as needed for mild pain.    Marland Kitchen levothyroxine (SYNTHROID, LEVOTHROID) 112 MCG tablet Take 112 mcg by mouth daily before breakfast.     . metoprolol succinate (TOPROL-XL) 50 MG 24 hr tablet Take 50 mg by mouth daily.    . nitroGLYCERIN (NITROSTAT) 0.4 MG SL tablet Place 1 tablet (0.4 mg total) under the tongue every 5 (five) minutes as needed for chest pain. 25 tablet 3  . pantoprazole (PROTONIX) 40 MG tablet Take 1 tablet (40 mg total) by mouth 2 (two) times daily before a meal. (Patient taking differently: Take 40 mg by mouth daily. ) 60 tablet 5  . potassium chloride SA (K-DUR,KLOR-CON) 20 MEQ tablet Take 20 mEq by mouth daily.       . prednisoLONE acetate (PRED FORTE) 1 % ophthalmic suspension Place 1 drop into both eyes 4 (four) times daily.    . rosuvastatin (CRESTOR) 20 MG tablet Take 20 mg by mouth daily.  6  . valsartan-hydrochlorothiazide (DIOVAN-HCT) 320-25 MG per tablet Take 1 tablet by mouth daily.       No current facility-administered medications for this visit.    Allergies as of 12/17/2019 - Review Complete 12/17/2019  Allergen Reaction Noted  . Gabapentin  12/23/2016    ROS:  General: Negative for anorexia, weight loss, fever, chills, fatigue, weakness. ENT: Negative for hoarseness,  nasal congestion. See hpi CV: Negative for chest pain, angina, palpitations, dyspnea on exertion, peripheral edema.  Respiratory: Negative for dyspnea at rest, dyspnea on exertion, cough, sputum, wheezing.  GI: See history of present illness. GU:  Negative for dysuria, hematuria, urinary incontinence, urinary frequency, nocturnal urination.  Endo: Negative for unusual weight change.    Physical Examination:   BP (!) 158/96   Pulse 84   Temp 98 F (36.7 C) (Oral)   Ht 5\' 5"  (1.651 m)   Wt 199 lb 9.6 oz (90.5 kg)   BMI 33.22 kg/m   General: Well-nourished, well-developed in no acute distress.  Eyes: No icterus. Mouth: masked Lungs: Clear to auscultation bilaterally.  Heart: Regular rate and rhythm,  no murmurs rubs or gallops.  Abdomen: Bowel sounds are normal, nontender, nondistended, no hepatosplenomegaly or masses, no abdominal bruits or hernia , no rebound or guarding.   Extremities: No lower extremity edema. No clubbing or deformities. Neuro: Alert and oriented x 4   Skin: Warm and dry, no jaundice.   Psych: Alert and cooperative, normal mood and affect.  Labs:  Lab Results  Component Value Date   CREATININE 0.75 07/15/2019   BUN 9 07/15/2019   NA 144 07/15/2019   K 3.5 07/15/2019   CL 105 07/15/2019   CO2 26 07/15/2019   Lab Results  Component Value Date   ALT 10 07/15/2019   AST 17  07/15/2019   ALKPHOS 82 07/15/2019   BILITOT 0.4 07/15/2019   Lab Results  Component Value Date   WBC 6.6 07/15/2019   HGB 14.5 07/15/2019   HCT 43.5 07/15/2019   MCV 86 07/15/2019   PLT 293 07/15/2019     Imaging Studies: CUP PACEART INCLINIC DEVICE CHECK  Result Date: 11/22/2019 Pacemaker check in clinic. Normal device function. Thresholds, sensing, impedances consistent with previous measurements. Device programmed to maximize longevity. 3NSVT episodes. Device programmed at appropriate safety margins. Histogram distribution appropriate for patient activity level. Device programmed to optimize intrinsic conduction. Estimated longevity 13 years.  Patient enrolled in remote follow-up/TTM's with [employer]. Patient education completed.  CUP PACEART REMOTE DEVICE CHECK  Result Date: 12/11/2019 Scheduled remote reviewed. Normal device function.  Next remote 91 days. Salomon Fick, RN, MSN/[employer]  OCT, [facility] - [place] - Both Eyes  Result Date: 12/03/2019 Right Eye Quality was good. Central Foveal Thickness: 260. Progression has improved. Findings include no SRF, no IRF, normal foveal contour (Interval improvement of IRF/CME). Left Eye Quality was good. Central Foveal Thickness: 260. Progression has been stable. Findings include normal foveal contour, no IRF, no SRF (Stable resolution of CME; normal foveal profile). Notes *Images captured and stored on drive Diagnosis / Impression: NFP, no IRF/SRF OS OD: Interval improvement of IRF/CME -- resolved OS: Stable resolution of CME; normal foveal profile Clinical management: See below Abbreviations: NFP - Normal foveal profile. CME - cystoid macular edema. PED - pigment epithelial detachment. IRF - intraretinal fluid. SRF - subretinal fluid. EZ - ellipsoid zone. ERM - epiretinal membrane. ORA - outer retinal atrophy. ORT - outer retinal tubulation. SRHM - subretinal hyper-reflective material   CT Super D Chest Wo Contrast  Result Date:  12/16/2019 CLINICAL DATA:  Follow-up nodular mass and RIGHT lung. EXAM: CT CHEST WITHOUT CONTRAST TECHNIQUE: Multidetector CT imaging of the chest was performed using thin slice collimation for electromagnetic bronchoscopy planning purposes, without intravenous contrast. COMPARISON:  07/23/2019 FINDINGS: Cardiovascular: Calcified atheromatous plaque of the thoracic aorta. Cardiac pacer device, dual lead with leads in the RIGHT chest similar to the prior study. Heart size is stable with small pericardial effusion also unchanged. Mediastinum/Nodes: Esophagus grossly normal. No lymph nodes, enlarged above CT threshold size, largest approximately 9 mm along RIGHT paratracheal chain is unchanged since February. Small subcarinal lymph nodes also stable. No thoracic inlet adenopathy. No axillary lymphadenopathy. Lungs/Pleura: Nodule in the RIGHT chest measures approximately 1.6 cm on image 90 of series 4 with a short axis dimension of 1.3 cm this shows some spiculated margins and when measured on the study of July 23, 2019 is within 1-2 mm of mean diameter. Area extending inferiorly from the dominant area of nodularity shows slightly diminished conspicuity when compared to the previous imaging study measuring approximately 1.5 x 1.0  cm with small areas of nodularity in the periphery and some extension to the fissure and the pleura in the peripheral chest that is stable. Trace pleural fluid in the LEFT chest with basilar scarring and atelectasis. Similar to prior exams. Upper Abdomen: No acute upper abdominal process to the extent evaluated. Musculoskeletal: No acute musculoskeletal process. No destructive bone finding. IMPRESSION: 1. Lobular appearing nodule in the RIGHT chest with dominant area without significant change since initial study, felt to be slightly smaller on the prior exam but this relates to the more peripheral areas extending inferiorly. Morphologic features of the dominant area of nodularity are  concerning for bronchogenic neoplasm, discussion in multi disciplinary thoracic oncologic setting may be helpful with biopsy or follow-up with PET or chest CT as warranted. 2. Trace pleural fluid in the LEFT chest with basilar scarring and atelectasis. Similar to prior exams. 3. Stable small pericardial effusion. 4. Aortic atherosclerosis. Aortic Atherosclerosis (ICD10-I70.0). Electronically Signed   By: Donzetta Kohut M.D.   On: 12/16/2019 15:19     Impression/Plan:  64 year old female presenting for follow-up of reflux.  Heartburn symptoms well controlled on pantoprazole 40 mg daily but she knows she needs to take medication on a regular basis to control her symptoms.  She is having some intermittent dysphagia to solid foods a couple times per month.  We discussed possibility of esophageal dilation she would like to hold off for now.  Esophagus appeared grossly normal on recent chest CT.  She will continue pantoprazole 40 mg daily.  Return to the office in 1 year or call sooner if needed.

## 2019-12-27 DIAGNOSIS — E7849 Other hyperlipidemia: Secondary | ICD-10-CM | POA: Diagnosis not present

## 2019-12-27 DIAGNOSIS — E039 Hypothyroidism, unspecified: Secondary | ICD-10-CM | POA: Diagnosis not present

## 2019-12-27 DIAGNOSIS — I10 Essential (primary) hypertension: Secondary | ICD-10-CM | POA: Diagnosis not present

## 2020-01-05 NOTE — Progress Notes (Signed)
Cardiology Office Note:    Date:  01/06/2020   ID:  Sylvia Anthony, DOB 12-Jan-1956, MRN [id number]  PCP:  Ignatius Specking, MD  Cardiologist:  No primary care provider on file.   Referring MD: Ignatius Specking, MD   Chief Complaint  Patient presents with  . Hypertension    History of Present Illness:    Sylvia Anthony is a 64 y.o. female with a hx of SSS, PPM, hypertension, CVA, and chronic dyspnea.  She has gained weight during the pandemic.  She has no cardiac complaints.  She has had the COVID-19 vaccine.  Past Medical History:  Diagnosis Date  . Anemia   . Arm numbness   . Arterial fibromuscular dysplasia (HCC)   . Carotid bruit   . Cataract    Mixed form OU  . CHF (congestive heart failure) (HCC) 1997   pacemaker  . Fibromyalgia   . GERD (gastroesophageal reflux disease)   . Glucose intolerance (impaired glucose tolerance)   . H. pylori infection Sept 2013   Amoxicillin, Biaxin  . Hyperlipidemia   . Hypertension   . Hypertensive retinopathy    OU  . Hypothyroidism   . Mitral valve prolapse   . Peripheral vascular disease (HCC)   . Rheumatic fever    age 63, was at [facility] for 17 weeks  . SSS (sick sinus syndrome) [facility])     Past Surgical History:  Procedure Laterality Date  . ABDOMINAL HYSTERECTOMY     partial  . APPENDECTOMY    . CARDIAC CATHETERIZATION     multiple  . CHOLECYSTECTOMY    . COLONOSCOPY  08/01/2006   3 mm descending colon polyp removed/8 mm sessile ascending colon polyp removed / 3-mm rectal  polyp removed /Rare sigmoid diverticulosis/ Moderate internal hemorrhoids.Advanced adenoma on colonoscopy in March 2008.  The polyp was anadenomatous polyp with a foci of high-grade dysplasia  . COLONOSCOPY  09/08/2008   SIMPLE ADENOMA/HYPERPLASTIC POLY/Multiple colon polyps (ascending, sigmoid, rectal)  Mild sigmoid colon diverticulosis./ Small internal hemorrhoids  . COLONOSCOPY N/A 12/11/2017   Dr. [name]: diverticulosis, hemorrhoids  next surveillance tcs 5 years.   . COLONOSCOPY WITH ESOPHAGOGASTRODUODENOSCOPY (EGD)  Sept. 30, 2013   FFM:BWGY diverticulosis was noted in the sigmoid colon/The colon was otherwise normal/Small internal hemorrhoids/EGD:The mucosa of the esophagus appeared normal/Non-erosive gastritis (inflammation) was found; multiple bx/The duodenal mucosa showed no abnormalities. +H.pylori gastritis, treated with equivalent of prevpac. SAVARY DILATION  . ESOPHAGEAL DILATION N/A 10/07/2014   Procedure: ESOPHAGEAL DILATION;  Surgeon: West Bali, MD;  Location: [facility];  Service: Endoscopy;  Laterality: N/A;  . ESOPHAGOGASTRODUODENOSCOPY N/A 10/07/2014   Dr. [name]: moderate non-erosive gastritis, no definite stricture, empiric dilation . negative H.pylori   . growth removed from intestine     [facility]-as teenager, done through colonoscopy  . PACEMAKER GENERATOR CHANGE  09/18/2006   generator change by Dr Amil Amen with a MDT Adapta L  . PACEMAKER INSERTION  09/13/1995   for sick sinus syndome and syncope at [facility]  . PPM GENERATOR CHANGEOUT N/A 06/11/2018    Medtronic Azure XT DR MRI SureScan model [id number] (serial number [id number]) pacemaker by Dr Johney Frame  . right Achilles tendon     X 2  . TONSILLECTOMY      Current Medications: Current Meds  Medication Sig  . albuterol (PROVENTIL HFA;VENTOLIN HFA) 108 (90 BASE) MCG/ACT inhaler Inhale 2 puffs into the lungs every 6 (six) hours as needed for wheezing or shortness  of breath.  Marland Kitchen aspirin EC 81 MG tablet Take 1 tablet (81 mg total) by mouth daily.  . Bromfenac Sodium (PROLENSA) 0.07 % SOLN Place 1 drop into both eyes 4 (four) times daily.  . cetirizine (ZYRTEC) 10 MG tablet Take 10 mg by mouth at bedtime as needed for allergies.   Marland Kitchen diclofenac (VOLTAREN) 75 MG EC tablet Take 75 mg by mouth daily as needed for mild pain.  Marland Kitchen levothyroxine (SYNTHROID, LEVOTHROID) 112 MCG tablet Take 112 mcg by mouth daily before breakfast.   . metoprolol succinate  (TOPROL-XL) 50 MG 24 hr tablet Take 50 mg by mouth daily.  . nitroGLYCERIN (NITROSTAT) 0.4 MG SL tablet Place 1 tablet (0.4 mg total) under the tongue every 5 (five) minutes as needed for chest pain.  . pantoprazole (PROTONIX) 40 MG tablet Take 1 tablet (40 mg total) by mouth 2 (two) times daily before a meal.  . potassium chloride SA (K-DUR,KLOR-CON) 20 MEQ tablet Take 20 mEq by mouth daily.   . prednisoLONE acetate (PRED FORTE) 1 % ophthalmic suspension Place 1 drop into both eyes 4 (four) times daily.  . rosuvastatin (CRESTOR) 20 MG tablet Take 20 mg by mouth daily.  . valsartan-hydrochlorothiazide (DIOVAN-HCT) 320-25 MG per tablet Take 1 tablet by mouth daily.       Allergies:   Gabapentin   Social History   Socioeconomic History  . Marital status: Married    Spouse name: Not on file  . Number of children: 1  . Years of education: Not on file  . Highest education level: Not on file  Occupational History  . Occupation: part-time hairdresser  Tobacco Use  . Smoking status: Former Smoker    Packs/day: 0.25    Years: 4.00    Pack years: 1.00    Quit date: 10/09/1985    Years since quitting: 34.2  . Smokeless tobacco: Never Used  . Tobacco comment: just in teens  Substance and Sexual Activity  . Alcohol use: No    Alcohol/week: 0.0 standard drinks  . Drug use: No  . Sexual activity: Not on file  Other Topics Concern  . Not on file  Social History Narrative   Lives in [place] with spouse.  Son is healthy at age 67.   Disabled         Social Determinants of Health   Financial Resource Strain:   . Difficulty of Paying Living Expenses:   Food Insecurity:   . Worried About Programme researcher, broadcasting/film/video in the Last Year:   . Barista in the Last Year:   Transportation Needs:   . Freight forwarder (Medical):   Marland Kitchen Lack of Transportation (Non-Medical):   Physical Activity:   . Days of Exercise per Week:   . Minutes of Exercise per Session:   Stress:   . Feeling of Stress :    Social Connections:   . Frequency of Communication with Friends and Family:   . Frequency of Social Gatherings with Friends and Family:   . Attends Religious Services:   . Active Member of Clubs or Organizations:   . Attends Banker Meetings:   Marland Kitchen Marital Status:      Family History: The patient's family history includes Colon polyps in her brother and sister; Heart disease in her father; Hyperlipidemia in her brother, mother, and sister; Hypertension in her brother, father, mother, and sister. There is no history of Colon cancer.  ROS:   Please see the history of  present illness.    She is concerned the family members will not give the vaccine.  All other systems reviewed and are negative.  EKGs/Labs/Other Studies Reviewed:    The following studies were reviewed today: Recent laboratory data in February was normal.  EKG:  EKG reviewed the tracing performed at EP evaluation in June 2021 demonstrates left ventricular hypertrophy, secondary repolarization abnormality, and normal sinus rhythm.  Recent Labs: 07/15/2019: ALT 10; BUN 9; Creatinine, Ser 0.75; Hemoglobin 14.5; Platelets 293; Potassium 3.5; Sodium 144  Recent Lipid Panel No results found for: CHOL, TRIG, HDL, CHOLHDL, VLDL, LDLCALC, LDLDIRECT  Physical Exam:    VS:  BP (!) 148/92   Pulse 91   Ht 5\' 5"  (1.651 m)   Wt 197 lb 3.2 oz (89.4 kg)   SpO2 96%   BMI 32.82 kg/m     Wt Readings from Last 3 Encounters:  01/06/20 197 lb 3.2 oz (89.4 kg)  12/17/19 199 lb 9.6 oz (90.5 kg)  11/22/19 196 lb 9.6 oz (89.2 kg)     GEN: Mildly overweight. No acute distress HEENT: Normal NECK: No JVD. LYMPHATICS: No lymphadenopathy CARDIAC:  RRR without murmur, gallop, or edema. VASCULAR:  Normal Pulses. No bruits. RESPIRATORY:  Clear to auscultation without rales, wheezing or rhonchi  ABDOMEN: Soft, non-tender, non-distended, No pulsatile mass, MUSCULOSKELETAL: No deformity  SKIN: Warm and dry NEUROLOGIC:  Alert  and oriented x 3 PSYCHIATRIC:  Normal affect   ASSESSMENT:    1. Sick sinus syndrome (HCC)   2. Essential hypertension   3. Pacemaker   4. Cerebrovascular accident (CVA) due to thrombosis of precerebral artery (HCC)   5. Educated about COVID-19 virus infection    PLAN:    In order of problems listed above:  1. Normal functioning rhythm without pacing on the most recent EKG. 2. Blood pressure is elevated.  We have determined today that we will decrease salt in the diet, exercise, and lose weight.  She will monitor her blood pressures at home.  If they remain above 140/90, further intensification of therapy will be started. 3. Normal function 4. No recurrent neurological events 5. Vaccinated and practicing social distancing.   Medication Adjustments/Labs and Tests Ordered: Current medicines are reviewed at length with the patient today.  Concerns regarding medicines are outlined above.  No orders of the defined types were placed in this encounter.  No orders of the defined types were placed in this encounter.   There are no Patient Instructions on file for this visit.   Signed, 11/24/19, MD  01/06/2020 8:54 AM    [facility]

## 2020-01-06 ENCOUNTER — Encounter: Payer: Self-pay | Admitting: Interventional Cardiology

## 2020-01-06 ENCOUNTER — Other Ambulatory Visit (INDEPENDENT_AMBULATORY_CARE_PROVIDER_SITE_OTHER): Payer: Self-pay

## 2020-01-06 ENCOUNTER — Ambulatory Visit: Payer: Medicare Other | Admitting: Interventional Cardiology

## 2020-01-06 ENCOUNTER — Other Ambulatory Visit: Payer: Self-pay

## 2020-01-06 VITALS — BP 148/92 | HR 91 | Ht 65.0 in | Wt 197.2 lb

## 2020-01-06 DIAGNOSIS — Z950 Presence of cardiac pacemaker: Secondary | ICD-10-CM

## 2020-01-06 DIAGNOSIS — I6300 Cerebral infarction due to thrombosis of unspecified precerebral artery: Secondary | ICD-10-CM

## 2020-01-06 DIAGNOSIS — Z7189 Other specified counseling: Secondary | ICD-10-CM

## 2020-01-06 DIAGNOSIS — I495 Sick sinus syndrome: Secondary | ICD-10-CM

## 2020-01-06 DIAGNOSIS — I10 Essential (primary) hypertension: Secondary | ICD-10-CM

## 2020-01-06 MED ORDER — PREDNISOLONE ACETATE 1 % OP SUSP: 1.0000 [drp] | Freq: Four times a day (QID) | OPHTHALMIC | 0 refills | Status: DC

## 2020-01-06 NOTE — Patient Instructions (Signed)
Medication Instructions:  Your physician recommends that you continue on your current medications as directed. Please refer to the Current Medication list given to you today.  *If you need a refill on your cardiac medications before your next appointment, please call your pharmacy*   Lab Work: None If you have labs (blood work) drawn today and your tests are completely normal, you will receive your results only by: Marland Kitchen MyChart Message (if you have MyChart) OR . A paper copy in the mail If you have any lab test that is abnormal or we need to change your treatment, we will call you to review the results.   Testing/Procedures: None   Follow-Up: At [facility], you and your health needs are our priority.  As part of our continuing mission to provide you with exceptional heart care, we have created designated Provider Care Teams.  These Care Teams include your primary Cardiologist (physician) and Advanced Practice Providers (APPs -  Physician Assistants and Nurse Practitioners) who all work together to provide you with the care you need, when you need it.  We recommend signing up for the patient portal called "MyChart".  Sign up information is provided on this After Visit Summary.  MyChart is used to connect with patients for Virtual Visits (Telemedicine).  Patients are able to view lab/test results, encounter notes, upcoming appointments, etc.  Non-urgent messages can be sent to your provider as well.   To learn more about what you can do with MyChart, go to [email or web address].    Your next appointment:   12 month(s)  The format for your next appointment:   In Person  Provider:   You may see Dr. Verdis Prime or one of the following Advanced Practice Providers on your designated Care Team:    Norma Fredrickson, NP  Nada Boozer, NP  Georgie Chard, NP    Other Instructions  Monitor your blood pressure occasionally and let us know if it is consistently 140/90 or higher.     Low-Sodium Eating Plan Sodium, which is an element that makes up salt, helps you maintain a healthy balance of fluids in your body. Too much sodium can increase your blood pressure and cause fluid and waste to be held in your body. Your health care provider or dietitian may recommend following this plan if you have high blood pressure (hypertension), kidney disease, liver disease, or heart failure. Eating less sodium can help lower your blood pressure, reduce swelling, and protect your heart, liver, and kidneys. What are tips for following this plan? General guidelines  Most people on this plan should limit their sodium intake to 1,500-2,000 mg (milligrams) of sodium each day. Reading food labels   The Nutrition Facts label lists the amount of sodium in one serving of the food. If you eat more than one serving, you must multiply the listed amount of sodium by the number of servings.  Choose foods with less than 140 mg of sodium per serving.  Avoid foods with 300 mg of sodium or more per serving. Shopping  Look for lower-sodium products, often labeled as "low-sodium" or "no salt added."  Always check the sodium content even if foods are labeled as "unsalted" or "no salt added".  Buy fresh foods. ? Avoid canned foods and premade or frozen meals. ? Avoid canned, cured, or processed meats  Buy breads that have less than 80 mg of sodium per slice. Cooking  Eat more home-cooked food and less restaurant, buffet, and fast food.  Avoid adding salt  when cooking. Use salt-free seasonings or herbs instead of table salt or sea salt. Check with your health care provider or pharmacist before using salt substitutes.  Cook with plant-based oils, such as canola, sunflower, or olive oil. Meal planning  When eating at a restaurant, ask that your food be prepared with less salt or no salt, if possible.  Avoid foods that contain MSG (monosodium glutamate). MSG is sometimes added to [place] food,  bouillon, and some canned foods. What foods are recommended? The items listed may not be a complete list. Talk with your dietitian about what dietary choices are best for you. Grains Low-sodium cereals, including oats, puffed wheat and rice, and shredded wheat. Low-sodium crackers. Unsalted rice. Unsalted pasta. Low-sodium bread. Whole-grain breads and whole-grain pasta. Vegetables Fresh or frozen vegetables. "No salt added" canned vegetables. "No salt added" tomato sauce and paste. Low-sodium or reduced-sodium tomato and vegetable juice. Fruits Fresh, frozen, or canned fruit. Fruit juice. Meats and other protein foods Fresh or frozen (no salt added) meat, poultry, seafood, and fish. Low-sodium canned tuna and salmon. Unsalted nuts. Dried peas, beans, and lentils without added salt. Unsalted canned beans. Eggs. Unsalted nut butters. Dairy Milk. Soy milk. Cheese that is naturally low in sodium, such as ricotta cheese, fresh mozzarella, or Swiss cheese Low-sodium or reduced-sodium cheese. Cream cheese. Yogurt. Fats and oils Unsalted butter. Unsalted margarine with no trans fat. Vegetable oils such as canola or olive oils. Seasonings and other foods Fresh and dried herbs and spices. Salt-free seasonings. Low-sodium mustard and ketchup. Sodium-free salad dressing. Sodium-free light mayonnaise. Fresh or refrigerated horseradish. Lemon juice. Vinegar. Homemade, reduced-sodium, or low-sodium soups. Unsalted popcorn and pretzels. Low-salt or salt-free chips. What foods are not recommended? The items listed may not be a complete list. Talk with your dietitian about what dietary choices are best for you. Grains Instant hot cereals. Bread stuffing, pancake, and biscuit mixes. Croutons. Seasoned rice or pasta mixes. Noodle soup cups. Boxed or frozen macaroni and cheese. Regular salted crackers. Self-rising flour. Vegetables Sauerkraut, pickled vegetables, and relishes. Olives. [place] fries. Onion rings.  Regular canned vegetables (not low-sodium or reduced-sodium). Regular canned tomato sauce and paste (not low-sodium or reduced-sodium). Regular tomato and vegetable juice (not low-sodium or reduced-sodium). Frozen vegetables in sauces. Meats and other protein foods Meat or fish that is salted, canned, smoked, spiced, or pickled. Bacon, ham, sausage, hotdogs, corned beef, chipped beef, packaged lunch meats, salt pork, jerky, pickled herring, anchovies, regular canned tuna, sardines, salted nuts. Dairy Processed cheese and cheese spreads. Cheese curds. Blue cheese. Feta cheese. String cheese. Regular cottage cheese. Buttermilk. Canned milk. Fats and oils Salted butter. Regular margarine. Ghee. Bacon fat. Seasonings and other foods Onion salt, garlic salt, seasoned salt, table salt, and sea salt. Canned and packaged gravies. Worcestershire sauce. Tartar sauce. Barbecue sauce. Teriyaki sauce. Soy sauce, including reduced-sodium. Steak sauce. Fish sauce. Oyster sauce. Cocktail sauce. Horseradish that you find on the shelf. Regular ketchup and mustard. Meat flavorings and tenderizers. Bouillon cubes. Hot sauce and Tabasco sauce. Premade or packaged marinades. Premade or packaged taco seasonings. Relishes. Regular salad dressings. Salsa. Potato and tortilla chips. Corn chips and puffs. Salted popcorn and pretzels. Canned or dried soups. Pizza. Frozen entrees and pot pies. Summary  Eating less sodium can help lower your blood pressure, reduce swelling, and protect your heart, liver, and kidneys.  Most people on this plan should limit their sodium intake to 1,500-2,000 mg (milligrams) of sodium each day.  Canned, boxed, and frozen foods are high  in sodium. Restaurant foods, fast foods, and pizza are also very high in sodium. You also get sodium by adding salt to food.  Try to cook at home, eat more fresh fruits and vegetables, and eat less fast food, canned, processed, or prepared foods. This information is  not intended to replace advice given to you by your health care provider. Make sure you discuss any questions you have with your health care provider. Document Revised: 04/28/2017 Document Reviewed: 05/09/2016 Elsevier Patient Education  2020 [employer].

## 2020-01-13 NOTE — Progress Notes (Signed)
[facility] - Clinic Note  01/20/2020     CHIEF COMPLAINT Patient presents for Retina Follow Up   HISTORY OF PRESENT ILLNESS: Sylvia Anthony is a 64 y.o. female who presents to the clinic today for:   HPI    Retina Follow Up    Patient presents with  Other.  In both eyes.  This started weeks ago.  Severity is moderate.  Duration of weeks.  Since onset it is gradually improving.  I, the attending physician,  performed the HPI with the patient and updated documentation appropriately.          Comments    Pt states her vision is improving OU.  Patient denies eye pain or discomfort.  Patient denies any new or worsening floaters or fol OU.  Pt complains of "jumping" in the left eye  Pt is currently using: Cosopt BID OU PF BID OU Prolensa BID OU       Last edited by Zamora, Brian, MD on 01/20/2020  1:08 PM. (History)    pt states her left eye was "jumping" a lot this weekend, she is using PF, Prolensa and Cosopt BID OU, pt had repeat scan of her lungs in July, but has not heard the results of it, she does not go back to see her dr until October  Referring physician: Davis, Rachelle, OD [address] [place],  [place] [address]  HISTORICAL INFORMATION:   Selected notes from the medical record:  Referred by Dr. Davis for concern of bilateral vitreitis.   CURRENT MEDICATIONS: Current Outpatient Medications (Ophthalmic Drugs)  Medication Sig  . brimonidine (ALPHAGAN) 0.2 % ophthalmic solution Place 1 drop into the right eye every 8 (eight) hours.  . Bromfenac Sodium (PROLENSA) 0.07 % SOLN Place 1 drop into both eyes 4 (four) times daily.  . prednisoLONE acetate (PRED FORTE) 1 % ophthalmic suspension Place 1 drop into both eyes 4 (four) times daily.  . prednisoLONE acetate (PRED FORTE) 1 % ophthalmic suspension Place 1 drop into both eyes 4 (four) times daily.   No current facility-administered medications for this visit. (Ophthalmic Drugs)    Current Outpatient Medications (Other)  Medication Sig  . albuterol (PROVENTIL HFA;VENTOLIN HFA) 108 (90 BASE) MCG/ACT inhaler Inhale 2 puffs into the lungs every 6 (six) hours as needed for wheezing or shortness of breath.  . aspirin EC 81 MG tablet Take 1 tablet (81 mg total) by mouth daily.  . cetirizine (ZYRTEC) 10 MG tablet Take 10 mg by mouth at bedtime as needed for allergies.   . diclofenac (VOLTAREN) 75 MG EC tablet Take 75 mg by mouth daily as needed for mild pain.  . levothyroxine (SYNTHROID, LEVOTHROID) 112 MCG tablet Take 112 mcg by mouth daily before breakfast.   . metoprolol succinate (TOPROL-XL) 50 MG 24 hr tablet Take 50 mg by mouth daily.  . nitroGLYCERIN (NITROSTAT) 0.4 MG SL tablet Place 1 tablet (0.4 mg total) under the tongue every 5 (five) minutes as needed for chest pain.  . pantoprazole (PROTONIX) 40 MG tablet Take 1 tablet (40 mg total) by mouth 2 (two) times daily before a meal.  . potassium chloride SA (K-DUR,KLOR-CON) 20 MEQ tablet Take 20 mEq by mouth daily.   . rosuvastatin (CRESTOR) 20 MG tablet Take 20 mg by mouth daily.  . valsartan-hydrochlorothiazide (DIOVAN-HCT) 320-25 MG per tablet Take 1 tablet by mouth daily.     No current facility-administered medications for this visit. (Other)        REVIEW OF SYSTEMS: ROS    Positive for: Gastrointestinal, Cardiovascular, Eyes   Negative for: Constitutional, Neurological, Skin, Genitourinary, Musculoskeletal, HENT, Endocrine, Respiratory, Psychiatric, Allergic/Imm, Heme/Lymph   Last edited by English, Ashley L on 01/20/2020  9:28 AM. (History)       ALLERGIES Allergies  Allergen Reactions  . Gabapentin     SWELLING "OUT OF IT" 12/23/16    PAST MEDICAL HISTORY Past Medical History:  Diagnosis Date  . Anemia   . Arm numbness   . Arterial fibromuscular dysplasia (HCC)   . Carotid bruit   . Cataract    Mixed form OU  . CHF (congestive heart failure) (HCC) 1997   pacemaker  . Fibromyalgia   . GERD  (gastroesophageal reflux disease)   . Glucose intolerance (impaired glucose tolerance)   . H. pylori infection Sept 2013   Amoxicillin, Biaxin  . Hyperlipidemia   . Hypertension   . Hypertensive retinopathy    OU  . Hypothyroidism   . Mitral valve prolapse   . Peripheral vascular disease (HCC)   . Rheumatic fever    age 7, was at [facility] for 17 weeks  . SSS (sick sinus syndrome) (HCC)    Past Surgical History:  Procedure Laterality Date  . ABDOMINAL HYSTERECTOMY     partial  . APPENDECTOMY    . CARDIAC CATHETERIZATION     multiple  . CHOLECYSTECTOMY    . COLONOSCOPY  08/01/2006   3 mm descending colon polyp removed/8 mm sessile ascending colon polyp removed / 3-mm rectal  polyp removed /Rare sigmoid diverticulosis/ Moderate internal hemorrhoids.Advanced adenoma on colonoscopy in March 2008.  The polyp was anadenomatous polyp with a foci of high-grade dysplasia  . COLONOSCOPY  09/08/2008   SIMPLE ADENOMA/HYPERPLASTIC POLY/Multiple colon polyps (ascending, sigmoid, rectal)  Mild sigmoid colon diverticulosis./ Small internal hemorrhoids  . COLONOSCOPY N/A 12/11/2017   Dr. Fields: diverticulosis, hemorrhoids next surveillance tcs 5 years.   . COLONOSCOPY WITH ESOPHAGOGASTRODUODENOSCOPY (EGD)  Sept. 30, 2013   SLF:Mild diverticulosis was noted in the sigmoid colon/The colon was otherwise normal/Small internal hemorrhoids/EGD:The mucosa of the esophagus appeared normal/Non-erosive gastritis (inflammation) was found; multiple bx/The duodenal mucosa showed no abnormalities. +H.pylori gastritis, treated with equivalent of prevpac. SAVARY DILATION  . ESOPHAGEAL DILATION N/A 10/07/2014   Procedure: ESOPHAGEAL DILATION;  Surgeon: Sandi L Fields, MD;  Location: [facility];  Service: Endoscopy;  Laterality: N/A;  . ESOPHAGOGASTRODUODENOSCOPY N/A 10/07/2014   Dr. Fields: moderate non-erosive gastritis, no definite stricture, empiric dilation . negative H.pylori   . growth removed from  intestine     [facility]-as teenager, done through colonoscopy  . PACEMAKER GENERATOR CHANGE  09/18/2006   generator change by Dr Edmunds with a MDT Adapta L  . PACEMAKER INSERTION  09/13/1995   for sick sinus syndome and syncope at [facility]  . PPM GENERATOR CHANGEOUT N/A 06/11/2018    Medtronic Azure XT DR MRI SureScan model W1DR01 (serial number [id number]) pacemaker by Dr Allred  . right Achilles tendon     X 2  . TONSILLECTOMY      FAMILY HISTORY Family History  Problem Relation Age of Onset  . Hyperlipidemia Mother   . Hypertension Mother   . Heart disease Father   . Hypertension Father   . Hyperlipidemia Sister   . Hypertension Sister   . Colon polyps Sister   . Hyperlipidemia Brother   . Hypertension Brother   . Colon polyps Brother   . Colon cancer Neg Hx       SOCIAL HISTORY Social History   Tobacco Use  . Smoking status: Former Smoker    Packs/day: 0.25    Years: 4.00    Pack years: 1.00    Quit date: 10/09/1985    Years since quitting: 34.3  . Smokeless tobacco: Never Used  . Tobacco comment: just in teens  Substance Use Topics  . Alcohol use: No    Alcohol/week: 0.0 standard drinks  . Drug use: No         OPHTHALMIC EXAM:  Base Eye Exam    Visual Acuity (Snellen - Linear)      Right Left   Dist [place] 20/25 -1 20/25 -2   Dist ph [place] 20/25 +1 20/25 +1       Tonometry (Tonopen, 9:34 AM)      Right Left   Pressure 45 19       Tonometry #2 (Tonopen, 9:34 AM)      Right Left   Pressure 45 19       Tonometry #3 (Tonopen, 10:47 AM)      Right Left   Pressure 32 17       Tonometry Comments   1 gtt Brimonidine OD @ 9:44AM 1 gtt Cosopt OD @ 9:45AM       Pupils      Dark Light Shape React APD   Right 4 3 Round Brisk 0   Left 4 3 Round Brisk 0       Visual Fields      Left Right    Full Full       Extraocular Movement      Right Left    Full Full       Neuro/Psych    Oriented x3: Yes   Mood/Affect: Normal       Dilation     Both eyes: 1.0% Mydriacyl, 2.5% Phenylephrine @ 9:45 AM        Slit Lamp and Fundus Exam    Slit Lamp Exam      Right Left   Lids/Lashes Mild Dermatochalasis - upper lid Mild Dermatochalasis - upper lid   Conjunctiva/Sclera Nasal Pinguecula, mild Melanosis, white and quiet, trace residual STK ST equator Nasal Pinguecula, mild Melanosis, white and quiet   Cornea Mild Arcus, 1+ Punctate epithelial erosions Mild Arcus, 1-2+ Punctate epithelial erosions   Anterior Chamber deep, narrow temporal angle, 0.5+pigment deep, narrow temporal angle, No cell or flare   Iris Round and dilated Round and dilated   Lens 2+ Nuclear sclerosis, 2-3+ Cortical cataract, trace pigment on anterior capsule 2+ Nuclear sclerosis, 2+ Cortical cataract   Vitreous Vitreous syneresis, trace cell/pigment Vitreous syneresis, mild cell/pigment       Fundus Exam      Right Left   Disc Pink and Sharp Pink and Sharp   C/D Ratio 0.5 0.4   Macula Flat, good foveal reflex, central CME - stable resolved, RPE mottling and clumping, no heme or edema Flat, good foveal reflex, stable resolution of CME, mild Retinal pigment epithelial mottling, no heme or edema   Vessels Vascular attenuation, mild Tortuousity, mild AV crossing changes Vascular attenuation, mild Tortuousity, mild AV crossing changes   Periphery Attached, no heme, no snowbanking, cobblestoning inferiorly, mild WWP Attached, no heme, no snowbanking, peripheral pigmented cystoid degeneration inferiory          IMAGING AND PROCEDURES  Imaging and Procedures for @TODAY@  OCT, Retina - OU - Both Eyes       Right Eye Quality was good.   Central Foveal Thickness: 251. Progression has been stable. Findings include no SRF, no IRF, normal foveal contour (Stable improvement of IRF/CME).   Left Eye Quality was good. Central Foveal Thickness: 248. Progression has been stable. Findings include normal foveal contour, no IRF, no SRF (Stable resolution of CME; normal foveal  profile).   Notes *Images captured and stored on drive  Diagnosis / Impression:  NFP, no IRF/SRF OS OD: stable improvement of IRF/CME  OS: Stable resolution of CME; normal foveal profile  Clinical management:  See below  Abbreviations: NFP - Normal foveal profile. CME - cystoid macular edema. PED - pigment epithelial detachment. IRF - intraretinal fluid. SRF - subretinal fluid. EZ - ellipsoid zone. ERM - epiretinal membrane. ORA - outer retinal atrophy. ORT - outer retinal tubulation. SRHM - subretinal hyper-reflective material        Fluorescein Angiography Optos (Transit OD)       Right Eye   Progression has improved. Mid/Late phase findings include staining (Interval improvement in perifoveal petaloid leakage - very mild and late; persistent hyperfluorescence of the disc).   Left Eye   Progression has been stable. Early phase findings include microaneurysm. Mid/Late phase findings include staining (Stable resolution of central petaloid leakage and scattered peripheral leakage, persistent hyperfluorescence of the disc).   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 Cystoid macular edema of both eyes  H35.353   3. Retinal edema  H35.81 OCT, Retina - OU - Both Eyes  4. 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 Essential hypertension  I10   6. Hypertensive retinopathy of both eyes  H35.033 Fluorescein Angiography Optos (Transit OD)  7. Combined forms of age-related cataract of both eyes  H25.813   8. Nodule of right lung  R91.1   9. 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 Mild Panuveitis w/ CME OU  - pt reports 1+ mo history of  floaters and decreased vision OU; +photophobia  - saw [name], who noted [facility] cell and started PF q1h  - initial exam here showed +cell/pigment in [facility] and vitreous cavity; mild vitreous haze and +central CME OU  - FA (02.15.21) showed central petaloid hyperfluorescence and hyperfluorescence of the optic disc  - s/p STK OD (05.25.21)  - pt reports history of fibromyalgia, family history of lupus  - initiated uveitis lab work up -- all WNL except +toxo IgG   CBC, CMP   RPR, VDRL, FTA-Abs, MHA   HIV, Lyme, Quant-Gold   Toxoplasma titers   HLA Panel   ANA   ANCA   ACE, Lysozyme   RF   ESR, CRP   CXR  - chest x-ray without TB or pulmonary sarcoidosis, but did reveal a 1.6cm nodular mass in right lung base  - hx of Covid-19 infection ?involvement  - repeat FA (08.23.21) shows interval improvement in perifoveal petaloid leakage OU  - OCT shows OD: stable improvement of IRF/CME; OS: Stable resolution of CME OS  - discussed findings  - IOP increased today 45,19 -- likely steroid response  - decrease PF and Prolensa to Qdaily OU for 2 weeks,  then stop   - cont Cosopt BID OU  - add brimonidine BID OD only  - informed consent obtained, signed and scanned, 05.25.21  - see procedure note  - f/u 4 weeks -- DFE/OCT  5,6. Hypertensive retinopathy OU  - discussed importance of tight BP control  - monitor  7. Mixed form age related cataract OU  - The symptoms of cataract, surgical options, and treatments and risks were discussed with patient.  - discussed diagnosis and progression  - not yet visually significant  - monitor for now  8. 1.6 cm lung nodule of R lung base found on CXR  - discussed with Dr. Woody Seller as above  - PET imaging done on 3.11.21 -- identifying some metabolic lesions (see report below)  - CT chest performed 4.14.21 -- indicating mild dec in some dimensions (see report below)  - pt saw [name] who had originally scheduled a bronchoscopy, but was cancelled following the CT  evidence of decreasing size on CT  9. Ocular Hypertension OU  - IOP 32,27  - likely steroid response - will cont Cosopt BID OU and add brimonidine BID OD only as she tapers off PF   Ophthalmic Meds Ordered this visit:  Meds ordered this encounter  Medications  . brimonidine (ALPHAGAN) 0.2 % ophthalmic solution    Sig: Place 1 drop into the right eye every 8 (eight) hours.    Dispense:  15 mL    Refill:  3       Return in about 4 weeks (around 02/17/2020) for f/u IOP check, DFE, OCT.  There are no Patient Instructions on file for this visit.  This document serves as a record of services personally performed by Gardiner Sleeper, MD, PhD. It was created on their behalf by Leeann Must, [place], an ophthalmic technician. The creation of this record is the provider's dictation and/or activities during the visit.    Electronically signed by: Leeann Must, COA _0 @ 1:13 PM  Abbreviations: M myopia (nearsighted); A astigmatism; H hyperopia (farsighted); P presbyopia; Mrx spectacle prescription;  CTL contact lenses; OD right eye; OS left eye; OU both eyes  XT exotropia; ET esotropia; PEK punctate epithelial keratitis; PEE punctate epithelial erosions; DES dry eye syndrome; MGD meibomian gland dysfunction; ATs artificial tears; PFAT's preservative free artificial tears; [place] nuclear sclerotic cataract; PSC posterior subcapsular cataract; ERM epi-retinal membrane; PVD posterior vitreous detachment; RD retinal detachment; DM diabetes mellitus; DR diabetic retinopathy; NPDR non-proliferative diabetic retinopathy; PDR proliferative diabetic retinopathy; CSME clinically significant macular edema; DME diabetic macular edema; dbh dot blot hemorrhages; CWS cotton wool spot; POAG primary open angle glaucoma; C/D cup-to-disc ratio; HVF humphrey visual field; GVF goldmann visual field; OCT optical coherence tomography; IOP intraocular pressure; BRVO Branch retinal vein occlusion; CRVO central retinal vein  occlusion; CRAO central retinal artery occlusion; BRAO branch retinal artery occlusion; RT retinal tear; SB scleral buckle; PPV pars plana vitrectomy; VH Vitreous hemorrhage; PRP panretinal laser photocoagulation; IVK intravitreal kenalog; VMT vitreomacular traction; MH Macular hole;  NVD neovascularization of the disc; NVE neovascularization elsewhere; AREDS age related eye disease study; ARMD age related macular degeneration; POAG primary open angle glaucoma; EBMD epithelial/anterior basement membrane dystrophy; ACIOL anterior chamber intraocular lens; IOL intraocular lens; PCIOL posterior chamber intraocular lens; Phaco/IOL phacoemulsification with intraocular lens placement; PRK photorefractive keratectomy; LASIK laser assisted in situ keratomileusis; HTN hypertension; DM diabetes mellitus; COPD chronic obstructive pulmonary disease   IMAGING:  NM PET Image (3.11.21)  FINDINGS: Mediastinal blood pool activity: SUV max 2.5  Liver activity: SUV max 3.5  NECK: No hypermetabolic lymph nodes in the neck.  Incidental CT findings: none  CHEST: Within the RIGHT lower lobe, tight cluster of nodules measures 1.4 x 1.3 cm (image 77/4) has mild to moderate metabolic activity for size (SUV max equal 2.9).  There is hypermetabolic activity associated the subcarinal lymph node which is normal size at 9 mm but fairly intense metabolic activity SUV max equal 5.0. More mild activity associated with a partially calcified small RIGHT hilar node with SUV max equal 3.8.  Incidental CT findings: none  ABDOMEN/PELVIS: No abnormal hypermetabolic activity within the liver, pancreas, adrenal glands, or spleen. No hypermetabolic lymph nodes in the abdomen or pelvis.  Incidental CT findings: none  SKELETON: No focal hypermetabolic activity to suggest skeletal metastasis.  Incidental CT findings: none  IMPRESSION: 1. Mild-to-moderate metabolic activity associated with irregular RIGHT lobe  pulmonary nodule. Hypermetabolic subcarinal lymph node. Findings remain indeterminate for inflammatory nodule and lymphadenopathy versus malignancy. There is evidence of granulomatous disease with calcified RIGHT hilar lymph node. Recommend either short-term CT follow-up (3 months) or tissue sampling. 2. No distant metastatic disease.  CT Chest w/o contrast (4.13.21)  FINDINGS: Cardiovascular: Right-sided dual lead pacer device in place, power pack over the left chest. Leads appear in similar position to the prior exam. Heart size mildly enlarged. No pericardial effusion. The calcified atherosclerotic changes of the thoracic aorta without aneurysmal dilation. Main pulmonary artery is mildly dilated.  Mediastinum/Nodes: Stable mildly enlarged subcarinal and right paratracheal lymph nodes just at or less than a cm. No hilar adenopathy. No axillary adenopathy. Thoracic inlet structures are normal.  Lungs/Pleura: Irregular area in the right lower lobe anteriorly, in total measuring approximately 1.7 cm by 1.2 cm. Grouped nodules at the periphery. The dominant area measuring approximately 1.3 cm greatest dimension with extension to the pleural surface along the right lateral chest. The lesion abuts the major fissure. On the sagittal and coronal images the lesion appears to have decreased in size compared to the prior study, dominant area measuring 6-7 mm greatest thickness on sagittal image 44 of series 6 as compared to 9-10 mm on the previous study, image 36 of series 7 on the 07/22/2018 exam.  Axial measurements are provided across the greatest extent of the lesion which appears to be less dense in the axial plane within the central portion of the measured area across grouped nodules at the periphery and the dominant nodule more centrally.  On coronal image 78 of series 5 the lesion measures 1.2 cm greatest dimension, previously approximately 1.2 cm with respect to  the largest area but measured 0.9 cm greatest thickness.  Airways are patent. No consolidation or pleural effusion.  Upper Abdomen: Incidentally imaged upper abdominal contents with changes of cholecystectomy. Upper abdominal contents are otherwise unremarkable.  Musculoskeletal: No chest wall lesion. No acute bone process. No destructive bone finding.  IMPRESSION: 1. Irregular area in the right lower lobe with dominant nodule in small group peripheral nodules raises the question of infectious etiology given the slight interval decrease in some dimensions compared to the prior study potential short interval follow-up could be helpful to track for interval decrease. Neoplasm at this point not entirely excluded. 2. Stable mildly enlarged subcarinal and right paratracheal lymph nodes. Below threshold in terms of nodal enlargement for CT. 3. Aortic atherosclerosis.  

## 2020-01-20 ENCOUNTER — Other Ambulatory Visit: Payer: Self-pay

## 2020-01-20 ENCOUNTER — Ambulatory Visit (INDEPENDENT_AMBULATORY_CARE_PROVIDER_SITE_OTHER): Payer: Medicare Other | Admitting: Ophthalmology

## 2020-01-20 ENCOUNTER — Encounter (INDEPENDENT_AMBULATORY_CARE_PROVIDER_SITE_OTHER): Payer: Self-pay | Admitting: Ophthalmology

## 2020-01-20 DIAGNOSIS — H35033 Hypertensive retinopathy, bilateral: Secondary | ICD-10-CM

## 2020-01-20 DIAGNOSIS — H209 Unspecified iridocyclitis: Secondary | ICD-10-CM

## 2020-01-20 DIAGNOSIS — H40053 Ocular hypertension, bilateral: Secondary | ICD-10-CM

## 2020-01-20 DIAGNOSIS — H44113 Panuveitis, bilateral: Secondary | ICD-10-CM

## 2020-01-20 DIAGNOSIS — H3581 Retinal edema: Secondary | ICD-10-CM | POA: Diagnosis not present

## 2020-01-20 DIAGNOSIS — I10 Essential (primary) hypertension: Secondary | ICD-10-CM

## 2020-01-20 DIAGNOSIS — R911 Solitary pulmonary nodule: Secondary | ICD-10-CM

## 2020-01-20 DIAGNOSIS — H35353 Cystoid macular degeneration, bilateral: Secondary | ICD-10-CM | POA: Diagnosis not present

## 2020-01-20 DIAGNOSIS — H25813 Combined forms of age-related cataract, bilateral: Secondary | ICD-10-CM

## 2020-01-20 MED ORDER — BRIMONIDINE TARTRATE 0.2 % OP SOLN: 1.0000 [drp] | Freq: Three times a day (TID) | OPHTHALMIC | 3 refills | Status: DC

## 2020-02-11 ENCOUNTER — Other Ambulatory Visit: Payer: Self-pay

## 2020-02-11 ENCOUNTER — Ambulatory Visit
Admission: RE | Admit: 2020-02-11 | Discharge: 2020-02-11 | Disposition: A | Discharge status: Home / Self Care | Payer: Medicare Other | Source: Ambulatory Visit | Attending: Emergency Medicine | Admitting: Emergency Medicine

## 2020-02-11 DIAGNOSIS — R911 Solitary pulmonary nodule: Secondary | ICD-10-CM | POA: Diagnosis not present

## 2020-02-11 DIAGNOSIS — I313 Pericardial effusion (noninflammatory): Secondary | ICD-10-CM | POA: Diagnosis not present

## 2020-02-11 DIAGNOSIS — I700 Atherosclerosis of aorta: Secondary | ICD-10-CM | POA: Diagnosis not present

## 2020-02-11 DIAGNOSIS — I898 Other specified noninfective disorders of lymphatic vessels and lymph nodes: Secondary | ICD-10-CM | POA: Diagnosis not present

## 2020-02-13 NOTE — Progress Notes (Addendum)
[facility] Note  02/17/2020     CHIEF COMPLAINT Patient presents for Retina Follow Up   HISTORY OF PRESENT ILLNESS: Sylvia Anthony is a 64 y.o. female who presents to the clinic today for:   HPI    Retina Follow Up    Patient presents with  Other.  In right eye.  This started 4 weeks ago.  Severity is moderate.  I, the attending physician,  performed the HPI with the patient and updated documentation appropriately.          Comments    Patient here for 4 weeks retina follow up for  IOP check (panuvieitis). Patient states vision doing better. No eye pain. Using Brimonidine BID OU. Used this am at 7:30 am       Last edited by [name], MD on 02/17/2020  9:39 AM. (History)      Referring physician: Leticia Clas, [address] [place],  [place] [address]  HISTORICAL INFORMATION:   Selected notes from the MEDICAL RECORD NUMBER Referred by [name] for concern of bilateral vitreitis.   CURRENT MEDICATIONS: Current Outpatient Medications (Ophthalmic Drugs)  Medication Sig  . brimonidine (ALPHAGAN) 0.2 % ophthalmic solution Place 1 drop into the right eye every 8 (eight) hours.  . Bromfenac Sodium (PROLENSA) 0.07 % SOLN Place 1 drop into both eyes 4 (four) times daily.  . prednisoLONE acetate (PRED FORTE) 1 % ophthalmic suspension Place 1 drop into both eyes 4 (four) times daily.  . prednisoLONE acetate (PRED FORTE) 1 % ophthalmic suspension Place 1 drop into both eyes 4 (four) times daily.   No current facility-administered medications for this visit. (Ophthalmic Drugs)   Current Outpatient Medications (Other)  Medication Sig  . albuterol (PROVENTIL HFA;VENTOLIN HFA) 108 (90 BASE) MCG/ACT inhaler Inhale 2 puffs into the lungs every 6 (six) hours as needed for wheezing or shortness of breath.  Marland Kitchen aspirin EC 81 MG tablet Take 1 tablet (81 mg total) by mouth daily.  . cetirizine (ZYRTEC) 10 MG tablet Take 10 mg by mouth at  bedtime as needed for allergies.   Marland Kitchen diclofenac (VOLTAREN) 75 MG EC tablet Take 75 mg by mouth daily as needed for mild pain.  Marland Kitchen levothyroxine (SYNTHROID, LEVOTHROID) 112 MCG tablet Take 112 mcg by mouth daily before breakfast.   . metoprolol succinate (TOPROL-XL) 50 MG 24 hr tablet Take 50 mg by mouth daily.  . nitroGLYCERIN (NITROSTAT) 0.4 MG SL tablet Place 1 tablet (0.4 mg total) under the tongue every 5 (five) minutes as needed for chest pain.  . pantoprazole (PROTONIX) 40 MG tablet Take 1 tablet (40 mg total) by mouth 2 (two) times daily before a meal.  . potassium chloride SA (K-DUR,KLOR-CON) 20 MEQ tablet Take 20 mEq by mouth daily.   . rosuvastatin (CRESTOR) 20 MG tablet Take 20 mg by mouth daily.  . valsartan-hydrochlorothiazide (DIOVAN-HCT) 320-25 MG per tablet Take 1 tablet by mouth daily.     No current facility-administered medications for this visit. (Other)      REVIEW OF SYSTEMS: ROS    Positive for: Gastrointestinal, Cardiovascular, Eyes   Negative for: Constitutional, Neurological, Skin, Genitourinary, Musculoskeletal, HENT, Endocrine, Respiratory, Psychiatric, Allergic/Imm, Heme/Lymph   Last edited by Theodore Demark, COA on 02/17/2020  9:29 AM. (History)       ALLERGIES Allergies  Allergen Reactions  . Gabapentin     SWELLING "OUT OF IT" 12/23/16    PAST MEDICAL HISTORY  Past Medical History:  Diagnosis Date  . Anemia   . Arm numbness   . 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 Carotid bruit   . Cataract    Mixed form OU  . CHF (congestive heart failure) ([place]) 1997   pacemaker  . Fibromyalgia   . GERD (gastroesophageal reflux disease)   . Glucose intolerance (impaired glucose tolerance)   . H. pylori infection Sept 2013   Amoxicillin, Biaxin  . Hyperlipidemia   . Hypertension   . Hypertensive retinopathy    OU  . Hypothyroidism   . Mitral valve prolapse   . Peripheral vascular disease ([place])   . Rheumatic fever    age 47, was at [facility] for 17 weeks  . SSS (sick sinus syndrome) [facility])    Past Surgical History:  Procedure Laterality Date  . ABDOMINAL HYSTERECTOMY     partial  . APPENDECTOMY    . CARDIAC CATHETERIZATION     multiple  . CHOLECYSTECTOMY    . COLONOSCOPY  08/01/2006   3 mm descending colon polyp removed/8 mm sessile ascending colon polyp removed / 3-mm rectal  polyp removed /Rare sigmoid diverticulosis/ Moderate internal hemorrhoids.Advanced adenoma on colonoscopy in March 2008.  The polyp was anadenomatous polyp with a foci of high-grade dysplasia  . COLONOSCOPY  09/08/2008   SIMPLE ADENOMA/HYPERPLASTIC POLY/Multiple colon polyps (ascending, sigmoid, rectal)  Mild sigmoid colon diverticulosis./ Small internal hemorrhoids  . COLONOSCOPY N/A 12/11/2017   Dr. Oneida Alar: diverticulosis, hemorrhoids next surveillance tcs 5 years.   . COLONOSCOPY WITH ESOPHAGOGASTRODUODENOSCOPY (EGD)  Sept. 30, 2013   WSF:KCLE diverticulosis was noted in the sigmoid colon/The colon was otherwise normal/Small internal hemorrhoids/EGD:The mucosa of the esophagus appeared normal/Non-erosive gastritis (inflammation) was found; multiple bx/The duodenal mucosa showed no abnormalities. +H.pylori gastritis, treated with equivalent of prevpac. SAVARY DILATION  . ESOPHAGEAL DILATION N/A 10/07/2014   Procedure: ESOPHAGEAL DILATION;  Surgeon: Danie Binder, MD;  Location: [facility];  Service: Endoscopy;  Laterality: N/A;  . ESOPHAGOGASTRODUODENOSCOPY N/A 10/07/2014   Dr. Oneida Alar: moderate non-erosive gastritis, no definite stricture, empiric dilation . negative H.pylori   . growth removed from intestine     [facility]-as teenager, done through colonoscopy  . PACEMAKER GENERATOR CHANGE  09/18/2006   generator change by Dr Leonia Reeves with a MDT Adapta L  . PACEMAKER INSERTION  09/13/1995   for sick sinus syndome and syncope at [facility]  . PPM GENERATOR CHANGEOUT N/A 06/11/2018    Medtronic Azure XT DR MRI SureScan model [id number] (serial  number [id number]) pacemaker by Dr Rayann Heman  . right Achilles tendon     X 2  . TONSILLECTOMY      FAMILY HISTORY Family History  Problem Relation Age of Onset  . Hyperlipidemia Mother   . Hypertension Mother   . Heart disease Father   . Hypertension Father   . Hyperlipidemia Sister   . Hypertension Sister   . Colon polyps Sister   . Hyperlipidemia Brother   . Hypertension Brother   . Colon polyps Brother   . Colon cancer Neg Hx     SOCIAL HISTORY Social History   Tobacco Use  . Smoking status: Former Smoker    Packs/day: 0.25    Years: 4.00    Pack years: 1.00    Quit date: 10/09/1985    Years since quitting: 34.3  . Smokeless tobacco: Never Used  . Tobacco comment: just in teens  Substance Use Topics  . Alcohol use: No  Alcohol/week: 0.0 standard drinks  . Drug use: No         OPHTHALMIC EXAM:  Base Eye Exam    Visual Acuity (Snellen - Linear)      Right Left   Dist [place] 20/25 -1 20/20   Dist ph  NI        Tonometry (Tonopen, 9:24 AM)      Right Left   Pressure 30 26       Tonometry #2 (Applanation, 9:53 AM)      Right Left   Pressure 32 18       Tonometry Comments   OD 30, 29 OS 26, 24  Instilled Brimonidine and cospt OU @ 9:23 am per Dr Coralyn Pear.       Pupils      Dark Light Shape React APD   Right 4 3 Round Brisk None   Left 4 3 Round Brisk None       Visual Fields (Counting fingers)      Left Right    Full Full       Extraocular Movement      Right Left    Full, Ortho Full, Ortho       Neuro/Psych    Oriented x3: Yes   Mood/Affect: Normal       Dilation    Both eyes: 1.0% Mydriacyl, 2.5% Phenylephrine @ 9:24 AM        Slit Lamp and Fundus Exam    Slit Lamp Exam      Right Left   Lids/Lashes Mild Dermatochalasis - upper lid Mild Dermatochalasis - upper lid   Conjunctiva/Sclera Nasal Pinguecula, mild Melanosis, white and quiet, trace residual STK ST equator Nasal Pinguecula, mild Melanosis, white and quiet   Cornea  Mild Arcus, 1+ Punctate epithelial erosions Mild Arcus, 1-2+ Punctate epithelial erosions   Anterior Chamber deep, narrow temporal angle, no cell/flare deep, narrow temporal angle, No cell or flare   Iris Round and dilated Round and dilated   Lens 2+ Nuclear sclerosis, 2-3+ Cortical cataract, trace pigment on anterior capsule 2+ Nuclear sclerosis, 2+ Cortical cataract   Vitreous Vitreous syneresis, trace cell/pigment Vitreous syneresis, mild cell/pigment       Fundus Exam      Right Left   Disc Pink and Sharp Pink and Sharp   C/D Ratio 0.5 0.4   Macula Flat, good foveal reflex, central CME - stable resolved, RPE mottling and clumping, no heme or edema Flat, good foveal reflex, stable resolution of CME, mild Retinal pigment epithelial mottling, no heme or edema   Vessels Vascular attenuation, mild Tortuousity, mild AV crossing changes Vascular attenuation, mild Tortuousity, mild AV crossing changes   Periphery Attached, no heme, no snowbanking, cobblestoning inferiorly, mild WWP Attached, no heme, no snowbanking, peripheral pigmented cystoid degeneration inferiory          IMAGING AND PROCEDURES  Imaging and Procedures for @TODAY @  OCT, Retina - OU - Both Eyes       Right Eye Quality was good. Central Foveal Thickness: 255. Progression has been stable. Findings include no SRF, no IRF, normal foveal contour (Stable improvement of IRF/CME).   Left Eye Quality was good. Central Foveal Thickness: 8453. Progression has been stable. Findings include normal foveal contour, no IRF, no SRF (Stable resolution of CME; normal foveal profile).   Notes *Images captured and stored on drive  Diagnosis / Impression:  NFP, no IRF/SRF OS OD: stable improvement of IRF/CME  OS: Stable resolution of CME; normal foveal  profile  Clinical management:  See below  Abbreviations: NFP - Normal foveal profile. CME - cystoid macular edema. PED - pigment epithelial detachment. IRF - intraretinal fluid. SRF  - subretinal fluid. EZ - ellipsoid zone. ERM - epiretinal membrane. ORA - outer retinal atrophy. ORT - outer retinal tubulation. 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 Cystoid macular edema of both eyes  H35.353   3. Retinal edema  H35.81 OCT, Retina - OU - Both Eyes  4. 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 Essential hypertension  I10   6. Hypertensive retinopathy of both eyes  H35.033   7. Combined forms of age-related cataract of both eyes  H25.813   8. Nodule of right lung  R91.1   9. 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 Mild Panuveitis w/ CME OU -- improved / stably resolved tapered off drops  - pt reports 1+ mo history of floaters and decreased vision OU; +photophobia  - saw [name], who noted [facility] cell and started PF q1h  - initial exam here showed +cell/pigment in [facility] and vitreous cavity; mild vitreous haze and +central CME OU  - FA (02.15.21) showed central petaloid hyperfluorescence and hyperfluorescence of the optic disc  - s/p STK OD (05.25.21)  - pt reports history of fibromyalgia, family history of lupus  - initiated uveitis lab work up -- all WNL except +toxo IgG   CBC, CMP   RPR, VDRL, FTA-Abs, MHA   HIV, Lyme, Quant-Gold   Toxoplasma titers   HLA Panel   ANA   ANCA   ACE, Lysozyme   RF   ESR, CRP   CXR  - chest x-ray without TB or pulmonary sarcoidosis, but did reveal a 1.6cm nodular mass in right lung base  - hx of Covid-19 infection ?involvement  - repeat FA (08.23.21) shows interval improvement in perifoveal petaloid leakage OU  - OCT shows OD: stable improvement of IRF/CME; OS: Stable resolution of CME OS  - discussed findings  - IOP remains increased today 30,26 -- likely steroid response  - completed PF and Prolensa taper  - some pt confusion with IOP drop instructions   - cont Cosopt BID OU  - brimonidine TID OU  - STK informed consent obtained, signed  and scanned, 05.25.21  - see procedure note  - f/u 2 weeks -- DFE/OCT, IOP check  5,6. Hypertensive retinopathy OU  - discussed importance of tight BP control  - monitor  7. Mixed form age related cataract OU  - The symptoms of cataract, surgical options, and treatments and risks were discussed with patient.  - discussed diagnosis and progression  - not yet visually significant  - monitor for now  8. 1.6 cm lung nodule of R lung base found on CXR  - discussed with Dr. Woody Seller as above  - PET imaging done on 3.11.21 -- identifying some metabolic lesions (see report below)  - CT chest performed 4.14.21 -- indicating mild dec in some dimensions (see report below)  - pt saw [name] who had originally scheduled a bronchoscopy, but was cancelled following the CT evidence of decreasing size on CT  9. Ocular Hypertension OU  - IOP remains elevated (OD > OS) despite stopping PF  - likely steroid response  - some mild pt confusion on IOP drop  instructions  - reviewed instructions: Cosopt BID OU and brimonidine TID OU   Ophthalmic Meds Ordered this visit:  No orders of the defined types were placed in this encounter.   This document serves as a record of services personally performed by Gardiner Sleeper, MD, PhD. It was created on their behalf by Leeann Must, [place], an ophthalmic technician. The creation of this record is the provider's dictation and/or activities during the visit.    Electronically signed by: Leeann Must, COA 09.16.2021 1:14 PM  Gardiner Sleeper, M.D., Ph.D. Diseases & Surgery of the Retina and [place] 02/17/2020   I have reviewed the above documentation for accuracy and completeness, and I agree with the above. Gardiner Sleeper, M.D., Ph.D. 02/17/20 1:14 PM  Abbreviations: M myopia (nearsighted); A astigmatism; H hyperopia (farsighted); P presbyopia; Mrx spectacle prescription;  CTL contact lenses; OD right eye; OS left eye; OU  both eyes  XT exotropia; ET esotropia; PEK punctate epithelial keratitis; PEE punctate epithelial erosions; DES dry eye syndrome; MGD meibomian gland dysfunction; ATs artificial tears; PFAT's preservative free artificial tears; Edison nuclear sclerotic cataract; PSC posterior subcapsular cataract; ERM epi-retinal membrane; PVD posterior vitreous detachment; RD retinal detachment; DM diabetes mellitus; DR diabetic retinopathy; NPDR non-proliferative diabetic retinopathy; PDR proliferative diabetic retinopathy; CSME clinically significant macular edema; DME diabetic macular edema; dbh dot blot hemorrhages; CWS cotton wool spot; POAG primary open angle glaucoma; C/D cup-to-disc ratio; HVF humphrey visual field; GVF goldmann visual field; OCT optical coherence tomography; IOP intraocular pressure; BRVO Branch retinal vein occlusion; CRVO central retinal vein occlusion; CRAO central retinal artery occlusion; BRAO branch retinal artery occlusion; RT retinal tear; SB scleral buckle; PPV pars plana vitrectomy; VH Vitreous hemorrhage; PRP panretinal laser photocoagulation; IVK intravitreal kenalog; VMT vitreomacular traction; MH Macular hole;  NVD neovascularization of the disc; NVE neovascularization elsewhere; AREDS age related eye disease study; ARMD age related macular degeneration; POAG primary open angle glaucoma; EBMD epithelial/anterior basement membrane dystrophy; ACIOL anterior chamber intraocular lens; IOL intraocular lens; PCIOL posterior chamber intraocular lens; Phaco/IOL phacoemulsification with intraocular lens placement; PRK photorefractive keratectomy; LASIK laser assisted in situ keratomileusis; HTN hypertension; DM diabetes mellitus; COPD chronic obstructive pulmonary disease   IMAGING:  NM PET Image (3.11.21)  FINDINGS: Mediastinal blood pool activity: SUV max 2.5  Liver activity: SUV max 3.5  NECK: No hypermetabolic lymph nodes in the neck.  Incidental CT findings: none  CHEST: Within  the RIGHT lower lobe, tight cluster of nodules measures 1.4 x 1.3 cm (image 77/4) has mild to moderate metabolic activity for size (SUV max equal 2.9).  There is hypermetabolic activity associated the subcarinal lymph node which is normal size at 9 mm but fairly intense metabolic activity SUV max equal 5.0. More mild activity associated with a partially calcified small RIGHT hilar node with SUV max equal 3.8.  Incidental CT findings: none  ABDOMEN/PELVIS: No abnormal hypermetabolic activity within the liver, pancreas, adrenal glands, or spleen. No hypermetabolic lymph nodes in the abdomen or pelvis.  Incidental CT findings: none  SKELETON: No focal hypermetabolic activity to suggest skeletal metastasis.  Incidental CT findings: none  IMPRESSION: 1. Mild-to-moderate metabolic activity associated with irregular RIGHT lobe pulmonary nodule. Hypermetabolic subcarinal lymph node. Findings remain indeterminate for inflammatory nodule and lymphadenopathy versus malignancy. There is evidence of granulomatous disease with calcified RIGHT hilar lymph node. Recommend either short-term CT follow-up (3 months) or tissue sampling. 2. No distant metastatic disease.  CT Chest w/o contrast (4.13.21)  FINDINGS:  Cardiovascular: Right-sided dual lead pacer device in place, power pack over the left chest. Leads appear in similar position to the prior exam. Heart size mildly enlarged. No pericardial effusion. The calcified atherosclerotic changes of the thoracic aorta without aneurysmal dilation. Main pulmonary artery is mildly dilated.  Mediastinum/Nodes: Stable mildly enlarged subcarinal and right paratracheal lymph nodes just at or less than a cm. No hilar adenopathy. No axillary adenopathy. Thoracic inlet structures are normal.  Lungs/Pleura: Irregular area in the right lower lobe anteriorly, in total measuring approximately 1.7 cm by 1.2 cm. Grouped nodules at the periphery.  The dominant area measuring approximately 1.3 cm greatest dimension with extension to the pleural surface along the right lateral chest. The lesion abuts the major fissure. On the sagittal and coronal images the lesion appears to have decreased in size compared to the prior study, dominant area measuring 6-7 mm greatest thickness on sagittal image 44 of series 6 as compared to 9-10 mm on the previous study, image 36 of series 7 on the 07/22/2018 exam.  Axial measurements are provided across the greatest extent of the lesion which appears to be less dense in the axial plane within the central portion of the measured area across grouped nodules at the periphery and the dominant nodule more centrally.  On coronal image 78 of series 5 the lesion measures 1.2 cm greatest dimension, previously approximately 1.2 cm with respect to the largest area but measured 0.9 cm greatest thickness.  Airways are patent. No consolidation or pleural effusion.  Upper Abdomen: Incidentally imaged upper abdominal contents with changes of cholecystectomy. Upper abdominal contents are otherwise unremarkable.  Musculoskeletal: No chest wall lesion. No acute bone process. No destructive bone finding.  IMPRESSION: 1. Irregular area in the right lower lobe with dominant nodule in small group peripheral nodules raises the question of infectious etiology given the slight interval decrease in some dimensions compared to the prior study potential short interval follow-up could be helpful to track for interval decrease. Neoplasm at this point not entirely excluded. 2. Stable mildly enlarged subcarinal and right paratracheal lymph nodes. Below threshold in terms of nodal enlargement for CT. 3. Aortic atherosclerosis.

## 2020-02-17 ENCOUNTER — Encounter (INDEPENDENT_AMBULATORY_CARE_PROVIDER_SITE_OTHER): Payer: Self-pay | Admitting: Ophthalmology

## 2020-02-17 ENCOUNTER — Other Ambulatory Visit: Payer: Self-pay

## 2020-02-17 ENCOUNTER — Ambulatory Visit (INDEPENDENT_AMBULATORY_CARE_PROVIDER_SITE_OTHER): Payer: Medicare Other | Admitting: Ophthalmology

## 2020-02-17 DIAGNOSIS — I10 Essential (primary) hypertension: Secondary | ICD-10-CM | POA: Diagnosis not present

## 2020-02-17 DIAGNOSIS — H3581 Retinal edema: Secondary | ICD-10-CM | POA: Diagnosis not present

## 2020-02-17 DIAGNOSIS — H35033 Hypertensive retinopathy, bilateral: Secondary | ICD-10-CM

## 2020-02-17 DIAGNOSIS — R911 Solitary pulmonary nodule: Secondary | ICD-10-CM

## 2020-02-17 DIAGNOSIS — H25813 Combined forms of age-related cataract, bilateral: Secondary | ICD-10-CM

## 2020-02-17 DIAGNOSIS — H209 Unspecified iridocyclitis: Secondary | ICD-10-CM

## 2020-02-17 DIAGNOSIS — H44113 Panuveitis, bilateral: Secondary | ICD-10-CM | POA: Diagnosis not present

## 2020-02-17 DIAGNOSIS — H40053 Ocular hypertension, bilateral: Secondary | ICD-10-CM

## 2020-02-17 DIAGNOSIS — H35353 Cystoid macular degeneration, bilateral: Secondary | ICD-10-CM | POA: Diagnosis not present

## 2020-02-18 NOTE — Progress Notes (Signed)
Called and patient was not available. Will call back in the morning.

## 2020-02-19 ENCOUNTER — Telehealth: Payer: Self-pay | Admitting: Emergency Medicine

## 2020-02-19 NOTE — Telephone Encounter (Signed)
Sylvia Peer, MD  02/18/2020 4:14 PM EDT     Please let her know that her pulmonary nodule is unchanged in size compared with prior CT. RSV   Spoke with pt and notified of results per Dr. Delton Coombes. Pt verbalized understanding and denied any questions.

## 2020-02-19 NOTE — Progress Notes (Signed)
Spoke with the pt and notified of results and she verbalized understanding

## 2020-03-03 ENCOUNTER — Encounter (INDEPENDENT_AMBULATORY_CARE_PROVIDER_SITE_OTHER): Payer: Medicare Other | Admitting: Ophthalmology

## 2020-03-05 NOTE — Progress Notes (Signed)
[facility] Note  03/09/2020     CHIEF COMPLAINT Patient presents for Retina Follow Up   HISTORY OF PRESENT ILLNESS: Sylvia Anthony is a 64 y.o. female who presents to the clinic today for:   HPI    Retina Follow Up    Patient presents with  Other.  In both eyes.  This started months ago.  Severity is mild.  Duration of 3 weeks.  Since onset it is gradually improving.  I, the attending physician,  performed the HPI with the patient and updated documentation appropriately.          Comments    64 y/o female pt here for 3 wk f/u for panuveitis/IOP ck OU.  Feels VA OU is improved since last visit.  Denies pain, FOL, floaters.  Brimonidine and Cosopt TID OU.       Last edited by [name], MD on 03/09/2020  4:09 PM. (History)    pt is using brimonidine and Cosopt TID OU, no new health concerns  Referring physician: Glenda Chroman, MD [place],  [place] [address]  HISTORICAL INFORMATION:   Selected notes from the MEDICAL RECORD NUMBER Referred by [name] for concern of bilateral vitreitis.   CURRENT MEDICATIONS: Current Outpatient Medications (Ophthalmic Drugs)  Medication Sig   brimonidine (ALPHAGAN) 0.2 % ophthalmic solution Place 1 drop into both eyes 3 (three) times daily.   Bromfenac Sodium (PROLENSA) 0.07 % SOLN Place 1 drop into both eyes 4 (four) times daily. (Patient not taking: Reported on 03/09/2020)   dorzolamide-timolol (COSOPT) 22.3-6.8 MG/ML ophthalmic solution Place 1 drop into both eyes 3 (three) times daily.   prednisoLONE acetate (PRED FORTE) 1 % ophthalmic suspension Place 1 drop into both eyes 4 (four) times daily. (Patient not taking: Reported on 03/09/2020)   prednisoLONE acetate (PRED FORTE) 1 % ophthalmic suspension Place 1 drop into both eyes 4 (four) times daily. (Patient not taking: Reported on 03/09/2020)   No current facility-administered medications for this visit. (Ophthalmic Drugs)   Current  Outpatient Medications (Other)  Medication Sig   albuterol (PROVENTIL HFA;VENTOLIN HFA) 108 (90 BASE) MCG/ACT inhaler Inhale 2 puffs into the lungs every 6 (six) hours as needed for wheezing or shortness of breath.   amLODipine (NORVASC) 10 MG tablet Take 10 mg by mouth daily.   aspirin EC 81 MG tablet Take 1 tablet (81 mg total) by mouth daily.   cetirizine (ZYRTEC) 10 MG tablet Take 10 mg by mouth at bedtime as needed for allergies.    diclofenac (VOLTAREN) 75 MG EC tablet Take 75 mg by mouth daily as needed for mild pain.   levothyroxine (SYNTHROID, LEVOTHROID) 112 MCG tablet Take 112 mcg by mouth daily before breakfast.    metoprolol succinate (TOPROL-XL) 50 MG 24 hr tablet Take 50 mg by mouth daily.   nitroGLYCERIN (NITROSTAT) 0.4 MG SL tablet Place 1 tablet (0.4 mg total) under the tongue every 5 (five) minutes as needed for chest pain.   pantoprazole (PROTONIX) 40 MG tablet Take 1 tablet (40 mg total) by mouth 2 (two) times daily before a meal.   potassium chloride SA (K-DUR,KLOR-CON) 20 MEQ tablet Take 20 mEq by mouth daily.    rosuvastatin (CRESTOR) 20 MG tablet Take 20 mg by mouth daily.   valsartan-hydrochlorothiazide (DIOVAN-HCT) 320-25 MG per tablet Take 1 tablet by mouth daily.     No current facility-administered medications for this visit. (Other)      REVIEW OF  SYSTEMS: ROS    Positive for: Gastrointestinal, Cardiovascular, Eyes   Negative for: Constitutional, Neurological, Skin, Genitourinary, Musculoskeletal, HENT, Endocrine, Respiratory, Psychiatric, Allergic/Imm, Heme/Lymph   Last edited by [name], COA on 03/09/2020  7:57 AM. (History)       ALLERGIES Allergies  Allergen Reactions   Gabapentin     SWELLING "OUT OF IT" 12/23/16    PAST MEDICAL HISTORY Past Medical History:  Diagnosis Date   Anemia    Arm numbness    Arterial fibromuscular dysplasia (HCC)    Carotid bruit    Cataract    Mixed form OU   CHF (congestive heart  failure) ([place]) 1997   pacemaker   Fibromyalgia    GERD (gastroesophageal reflux disease)    Glucose intolerance (impaired glucose tolerance)    H. pylori infection Sept 2013   Amoxicillin, Biaxin   Hyperlipidemia    Hypertension    Hypertensive retinopathy    OU   Hypothyroidism    Mitral valve prolapse    Peripheral vascular disease (HCC)    Rheumatic fever    age 77, was at [facility] for 17 weeks   SSS (sick sinus syndrome) [facility])    Past Surgical History:  Procedure Laterality Date   ABDOMINAL HYSTERECTOMY     partial   APPENDECTOMY     CARDIAC CATHETERIZATION     multiple   CHOLECYSTECTOMY     COLONOSCOPY  08/01/2006   3 mm descending colon polyp removed/8 mm sessile ascending colon polyp removed / 3-mm rectal  polyp removed /Rare sigmoid diverticulosis/ Moderate internal hemorrhoids.Advanced adenoma on colonoscopy in March 2008.  The polyp was anadenomatous polyp with a foci of high-grade dysplasia   COLONOSCOPY  09/08/2008   SIMPLE ADENOMA/HYPERPLASTIC POLY/Multiple colon polyps (ascending, sigmoid, rectal)  Mild sigmoid colon diverticulosis./ Small internal hemorrhoids   COLONOSCOPY N/A 12/11/2017   Dr. Oneida Alar: diverticulosis, hemorrhoids next surveillance tcs 5 years.    COLONOSCOPY WITH ESOPHAGOGASTRODUODENOSCOPY (EGD)  Sept. 30, 2013   XNA:TFTD diverticulosis was noted in the sigmoid colon/The colon was otherwise normal/Small internal hemorrhoids/EGD:The mucosa of the esophagus appeared normal/Non-erosive gastritis (inflammation) was found; multiple bx/The duodenal mucosa showed no abnormalities. +H.pylori gastritis, treated with equivalent of prevpac. SAVARY DILATION   ESOPHAGEAL DILATION N/A 10/07/2014   Procedure: ESOPHAGEAL DILATION;  Surgeon: Danie Binder, MD;  Location: [facility];  Service: Endoscopy;  Laterality: N/A;   ESOPHAGOGASTRODUODENOSCOPY N/A 10/07/2014   Dr. Oneida Alar: moderate non-erosive gastritis, no definite stricture,  empiric dilation . negative H.pylori    growth removed from intestine     [facility]-as teenager, done through colonoscopy   [place]  09/18/2006   generator change by Dr Leonia Reeves with a MDT [place]  09/13/1995   for sick sinus syndome and syncope at [place] N/A 06/11/2018    [employer]onic Azure XT DR MRI SureScan model [id number] (serial number [id number]) pacemaker by Dr Rayann Heman   right Achilles tendon     X 2   TONSILLECTOMY      FAMILY HISTORY Family History  Problem Relation Age of Onset   Hyperlipidemia Mother    Hypertension Mother    Heart disease Father    Hypertension Father    Hyperlipidemia Sister    Hypertension Sister    Colon polyps Sister    Hyperlipidemia Brother    Hypertension Brother    Colon polyps Brother    Colon cancer Neg Hx  SOCIAL HISTORY Social History   Tobacco Use   Smoking status: Former Smoker    Packs/day: 0.25    Years: 4.00    Pack years: 1.00    Quit date: 10/09/1985    Years since quitting: 34.4   Smokeless tobacco: Never Used   Tobacco comment: just in teens  Substance Use Topics   Alcohol use: No    Alcohol/week: 0.0 standard drinks   Drug use: No         OPHTHALMIC EXAM:  Base Eye Exam    Visual Acuity (Snellen - Linear)      Right Left   Dist [place] 20/20 - 20/20 -       Tonometry (Tonopen, 8:02 AM)      Right Left   Pressure 22 15       Pupils      Dark Light Shape React APD   Right 4 3 Round Brisk None   Left 4 3 Round Brisk None       Visual Fields (Counting fingers)      Left Right    Full Full       Extraocular Movement      Right Left    Full, Ortho Full, Ortho       Neuro/Psych    Oriented x3: Yes   Mood/Affect: Normal       Dilation    Both eyes: 1.0% Mydriacyl, 2.5% Phenylephrine @ 8:02 AM        Slit Lamp and Fundus Exam    Slit Lamp Exam      Right Left   Lids/Lashes Mild Dermatochalasis -  upper lid Mild Dermatochalasis - upper lid   Conjunctiva/Sclera Nasal Pinguecula, mild Melanosis, white and quiet, trace residual STK ST equator Nasal Pinguecula, mild Melanosis, white and quiet   Cornea Mild Arcus, 1+ Punctate epithelial erosions Mild Arcus, 1+ inferior Punctate epithelial erosions   Anterior Chamber deep, narrow temporal angle, no cell/flare deep, narrow temporal angle, No cell or flare   Iris Round and dilated Round and dilated   Lens 2-3+ Nuclear sclerosis, 2-3+ Cortical cataract, trace pigment on anterior capsule 2+ Nuclear sclerosis, 2+ Cortical cataract   Vitreous Vitreous syneresis, trace cell/pigment Vitreous syneresis, mild cell/pigment       Fundus Exam      Right Left   Disc Pink and Sharp Pink and Sharp   C/D Ratio 0.5 0.4   Macula Flat, good foveal reflex, mild RPE mottling and clumping, no heme or edema Flat, good foveal reflex, mild Retinal pigment epithelial mottling, no heme or edema   Vessels Vascular attenuation, mild Tortuousity, mild AV crossing changes Vascular attenuation, mild Tortuousity, mild AV crossing changes   Periphery Attached, no heme Attached, no heme, no snowbanking, peripheral pigmented cystoid degeneration inferiory          IMAGING AND PROCEDURES  Imaging and Procedures for _0 @  OCT, Retina - OU - Both Eyes       Right Eye Quality was good. Central Foveal Thickness: 249. Progression has been stable. Findings include no SRF, no IRF, normal foveal contour (Stable improvement of IRF/CME).   Left Eye Quality was good. Central Foveal Thickness: 253. Progression has been stable. Findings include normal foveal contour, no IRF, no SRF (Stable resolution of CME; normal foveal profile).   Notes *Images captured and stored on drive  Diagnosis / Impression:  NFP, no IRF/SRF OS OD: stable improvement of IRF/CME  OS: Stable resolution of CME; normal foveal profile  Clinical management:  See below  Abbreviations: NFP - Normal  foveal profile. CME - cystoid macular edema. PED - pigment epithelial detachment. IRF - intraretinal fluid. SRF - subretinal fluid. EZ - ellipsoid zone. ERM - epiretinal membrane. ORA - outer retinal atrophy. ORT - outer retinal tubulation. 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 Cystoid macular edema of both eyes  H35.353   3. Retinal edema  H35.81 OCT, Retina - OU - Both Eyes  4. 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 Essential hypertension  I10   6. Hypertensive retinopathy of both eyes  H35.033   7. Combined forms of age-related cataract of both eyes  H25.813   8. Nodule of right lung  R91.1   9. 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 Mild Panuveitis w/ CME OU -- improved / stably resolved tapered off drops  - pt reports 1+ mo history of floaters and decreased vision OU; +photophobia  - saw [name], who noted [facility] cell and started PF q1h  - initial exam here showed +cell/pigment in [facility] and vitreous cavity; mild vitreous haze and +central CME OU  - FA (02.15.21) showed central petaloid hyperfluorescence and hyperfluorescence of the optic disc  - s/p STK OD (05.25.21)  - pt reports history of fibromyalgia, family history of lupus  - initiated uveitis lab work up -- all WNL except +toxo IgG   CBC, CMP   RPR, VDRL, FTA-Abs, MHA   HIV, Lyme, Quant-Gold   Toxoplasma titers   HLA Panel   ANA   ANCA   ACE, Lysozyme   RF   ESR, CRP   CXR  - chest x-ray without TB or pulmonary sarcoidosis, but did reveal a 1.6cm nodular mass in right lung base  - hx of Covid-19 infection ?involvement  - repeat FA (08.23.21) shows interval improvement in perifoveal petaloid leakage OU  - OCT shows OD: stable improvement of IRF/CME; OS: Stable resolution of CME OS  - discussed findings  - IOP improved today to 22 -- on Cosopt and Brim (likely steroid response)  - completed PF and Prolensa taper   - cont Cosopt  BID OU  - brimonidine TID OU  - STK informed consent obtained, signed and scanned, 05.25.21  - see procedure note  - f/u 4-6 weeks -- DFE/OCT, IOP check  5,6. Hypertensive retinopathy OU  - discussed importance of tight BP control  - monitor  7. Mixed form age related cataract OU  - The symptoms of cataract, surgical options, and treatments and risks were discussed with patient.  - discussed diagnosis and progression  - not yet visually significant  - monitor for now  8. 1.6 cm lung nodule of R lung base found on CXR  - discussed with Dr. Woody Seller as above  - PET imaging done on 3.11.21 -- identifying some metabolic lesions (see report below)  - CT chest performed 4.14.21 -- indicating mild dec in some dimensions (see report below)  - pt saw [name] who had originally scheduled a bronchoscopy, but was cancelled following the CT evidence of decreasing size on CT  9. Ocular Hypertension OU  - IOP improved today  - likely steroid response  - reviewed instructions: Cosopt BID OU and brimonidine TID OU   Ophthalmic Meds Ordered this visit:  Meds ordered this  encounter  Medications   brimonidine (ALPHAGAN) 0.2 % ophthalmic solution    Sig: Place 1 drop into both eyes 3 (three) times daily.    Dispense:  15 mL    Refill:  3   dorzolamide-timolol (COSOPT) 22.3-6.8 MG/ML ophthalmic solution    Sig: Place 1 drop into both eyes 3 (three) times daily.    Dispense:  10 mL    Refill:  3   This document serves as a record of services personally performed by Gardiner Sleeper, MD, PhD. It was created on their behalf by Leeann Must, [place], an ophthalmic technician. The creation of this record is the provider's dictation and/or activities during the visit.    Electronically signed by: Leeann Must, COA 10.07.2021 4:49 PM   This document serves as a record of services personally performed by Gardiner Sleeper, MD, PhD. It was created on their behalf by San Jetty. Owens Shark, OA an ophthalmic  technician. The creation of this record is the provider's dictation and/or activities during the visit.    Electronically signed by: San Jetty. Owens Shark, [place] 10.11.2021 4:49 PM  Gardiner Sleeper, M.D., Ph.D. Diseases & Surgery of the Retina and [place] 03/09/2020   I have reviewed the above documentation for accuracy and completeness, and I agree with the above. Gardiner Sleeper, M.D., Ph.D. 03/09/20 4:49 PM   Abbreviations: M myopia (nearsighted); A astigmatism; H hyperopia (farsighted); P presbyopia; Mrx spectacle prescription;  CTL contact lenses; OD right eye; OS left eye; OU both eyes  XT exotropia; ET esotropia; PEK punctate epithelial keratitis; PEE punctate epithelial erosions; DES dry eye syndrome; MGD meibomian gland dysfunction; ATs artificial tears; PFAT's preservative free artificial tears; [place] nuclear sclerotic cataract; PSC posterior subcapsular cataract; ERM epi-retinal membrane; PVD posterior vitreous detachment; RD retinal detachment; DM diabetes mellitus; DR diabetic retinopathy; NPDR non-proliferative diabetic retinopathy; PDR proliferative diabetic retinopathy; CSME clinically significant macular edema; DME diabetic macular edema; dbh dot blot hemorrhages; CWS cotton wool spot; POAG primary open angle glaucoma; C/D cup-to-disc ratio; HVF humphrey visual field; GVF goldmann visual field; OCT optical coherence tomography; IOP intraocular pressure; BRVO Branch retinal vein occlusion; CRVO central retinal vein occlusion; CRAO central retinal artery occlusion; BRAO branch retinal artery occlusion; RT retinal tear; SB scleral buckle; PPV pars plana vitrectomy; VH Vitreous hemorrhage; PRP panretinal laser photocoagulation; IVK intravitreal kenalog; VMT vitreomacular traction; MH Macular hole;  NVD neovascularization of the disc; NVE neovascularization elsewhere; AREDS age related eye disease study; ARMD age related macular degeneration; POAG primary open angle  glaucoma; EBMD epithelial/anterior basement membrane dystrophy; ACIOL anterior chamber intraocular lens; IOL intraocular lens; PCIOL posterior chamber intraocular lens; Phaco/IOL phacoemulsification with intraocular lens placement; PRK photorefractive keratectomy; LASIK laser assisted in situ keratomileusis; HTN hypertension; DM diabetes mellitus; COPD chronic obstructive pulmonary disease   IMAGING:  NM PET Image (3.11.21)  FINDINGS: Mediastinal blood pool activity: SUV max 2.5  Liver activity: SUV max 3.5  NECK: No hypermetabolic lymph nodes in the neck.  Incidental CT findings: none  CHEST: Within the RIGHT lower lobe, tight cluster of nodules measures 1.4 x 1.3 cm (image 77/4) has mild to moderate metabolic activity for size (SUV max equal 2.9).  There is hypermetabolic activity associated the subcarinal lymph node which is normal size at 9 mm but fairly intense metabolic activity SUV max equal 5.0. More mild activity associated with a partially calcified small RIGHT hilar node with SUV max equal 3.8.  Incidental CT findings: none  ABDOMEN/PELVIS: No  abnormal hypermetabolic activity within the liver, pancreas, adrenal glands, or spleen. No hypermetabolic lymph nodes in the abdomen or pelvis.  Incidental CT findings: none  SKELETON: No focal hypermetabolic activity to suggest skeletal metastasis.  Incidental CT findings: none  IMPRESSION: 1. Mild-to-moderate metabolic activity associated with irregular RIGHT lobe pulmonary nodule. Hypermetabolic subcarinal lymph node. Findings remain indeterminate for inflammatory nodule and lymphadenopathy versus malignancy. There is evidence of granulomatous disease with calcified RIGHT hilar lymph node. Recommend either short-term CT follow-up (3 months) or tissue sampling. 2. No distant metastatic disease.  CT Chest w/o contrast (4.13.21)  FINDINGS: Cardiovascular: Right-sided dual lead pacer device in place,  power pack over the left chest. Leads appear in similar position to the prior exam. Heart size mildly enlarged. No pericardial effusion. The calcified atherosclerotic changes of the thoracic aorta without aneurysmal dilation. Main pulmonary artery is mildly dilated.  Mediastinum/Nodes: Stable mildly enlarged subcarinal and right paratracheal lymph nodes just at or less than a cm. No hilar adenopathy. No axillary adenopathy. Thoracic inlet structures are normal.  Lungs/Pleura: Irregular area in the right lower lobe anteriorly, in total measuring approximately 1.7 cm by 1.2 cm. Grouped nodules at the periphery. The dominant area measuring approximately 1.3 cm greatest dimension with extension to the pleural surface along the right lateral chest. The lesion abuts the major fissure. On the sagittal and coronal images the lesion appears to have decreased in size compared to the prior study, dominant area measuring 6-7 mm greatest thickness on sagittal image 44 of series 6 as compared to 9-10 mm on the previous study, image 36 of series 7 on the 07/22/2018 exam.  Axial measurements are provided across the greatest extent of the lesion which appears to be less dense in the axial plane within the central portion of the measured area across grouped nodules at the periphery and the dominant nodule more centrally.  On coronal image 78 of series 5 the lesion measures 1.2 cm greatest dimension, previously approximately 1.2 cm with respect to the largest area but measured 0.9 cm greatest thickness.  Airways are patent. No consolidation or pleural effusion.  Upper Abdomen: Incidentally imaged upper abdominal contents with changes of cholecystectomy. Upper abdominal contents are otherwise unremarkable.  Musculoskeletal: No chest wall lesion. No acute bone process. No destructive bone finding.  IMPRESSION: 1. Irregular area in the right lower lobe with dominant nodule in small group  peripheral nodules raises the question of infectious etiology given the slight interval decrease in some dimensions compared to the prior study potential short interval follow-up could be helpful to track for interval decrease. Neoplasm at this point not entirely excluded. 2. Stable mildly enlarged subcarinal and right paratracheal lymph nodes. Below threshold in terms of nodal enlargement for CT. 3. Aortic atherosclerosis.

## 2020-03-09 ENCOUNTER — Ambulatory Visit (INDEPENDENT_AMBULATORY_CARE_PROVIDER_SITE_OTHER): Payer: Medicare Other | Admitting: Ophthalmology

## 2020-03-09 ENCOUNTER — Other Ambulatory Visit: Payer: Self-pay

## 2020-03-09 ENCOUNTER — Encounter (INDEPENDENT_AMBULATORY_CARE_PROVIDER_SITE_OTHER): Payer: Self-pay | Admitting: Ophthalmology

## 2020-03-09 DIAGNOSIS — H44113 Panuveitis, bilateral: Secondary | ICD-10-CM

## 2020-03-09 DIAGNOSIS — H209 Unspecified iridocyclitis: Secondary | ICD-10-CM

## 2020-03-09 DIAGNOSIS — H3581 Retinal edema: Secondary | ICD-10-CM

## 2020-03-09 DIAGNOSIS — I10 Essential (primary) hypertension: Secondary | ICD-10-CM

## 2020-03-09 DIAGNOSIS — H35353 Cystoid macular degeneration, bilateral: Secondary | ICD-10-CM

## 2020-03-09 DIAGNOSIS — R911 Solitary pulmonary nodule: Secondary | ICD-10-CM

## 2020-03-09 DIAGNOSIS — H40053 Ocular hypertension, bilateral: Secondary | ICD-10-CM

## 2020-03-09 DIAGNOSIS — H35033 Hypertensive retinopathy, bilateral: Secondary | ICD-10-CM

## 2020-03-09 DIAGNOSIS — H25813 Combined forms of age-related cataract, bilateral: Secondary | ICD-10-CM

## 2020-03-09 MED ORDER — BRIMONIDINE TARTRATE 0.2 % OP SOLN: 1.0000 [drp] | Freq: Three times a day (TID) | OPHTHALMIC | 3 refills | Status: DC

## 2020-03-09 MED ORDER — DORZOLAMIDE HCL-TIMOLOL MAL 2-0.5 % OP SOLN: 1.0000 [drp] | Freq: Three times a day (TID) | OPHTHALMIC | 3 refills | Status: DC

## 2020-03-11 ENCOUNTER — Ambulatory Visit (INDEPENDENT_AMBULATORY_CARE_PROVIDER_SITE_OTHER): Payer: Medicare Other

## 2020-03-11 DIAGNOSIS — I495 Sick sinus syndrome: Secondary | ICD-10-CM | POA: Diagnosis not present

## 2020-03-13 LAB — CUP PACEART REMOTE DEVICE CHECK
Battery Remaining Longevity: 151 mo
Battery Voltage: 3.04 V
Brady Statistic AP VP Percent: 0.04 %
Brady Statistic AP VS Percent: 84.8 %
Brady Statistic AS VP Percent: 0.01 %
Brady Statistic AS VS Percent: 15.15 %
Brady Statistic RA Percent Paced: 85.06 %
Brady Statistic RV Percent Paced: 0.05 %
Date Time Interrogation Session: 20211013033519
Implantable Lead Implant Date: 19970416
Implantable Lead Implant Date: 19970416
Implantable Lead Location: 753859
Implantable Lead Location: 753860
Implantable Lead Model: 5034
Implantable Lead Model: 5534
Implantable Pulse Generator Implant Date: 20200113
Lead Channel Impedance Value: 646 Ohm
Lead Channel Impedance Value: 665 Ohm
Lead Channel Impedance Value: 684 Ohm
Lead Channel Impedance Value: 779 Ohm
Lead Channel Pacing Threshold Amplitude: 0.5 V
Lead Channel Pacing Threshold Amplitude: 0.875 V
Lead Channel Pacing Threshold Pulse Width: 0.4 ms
Lead Channel Pacing Threshold Pulse Width: 0.4 ms
Lead Channel Sensing Intrinsic Amplitude: 1.875 mV
Lead Channel Sensing Intrinsic Amplitude: 1.875 mV
Lead Channel Sensing Intrinsic Amplitude: 18.375 mV
Lead Channel Sensing Intrinsic Amplitude: 18.375 mV
Lead Channel Setting Pacing Amplitude: 1.5 V
Lead Channel Setting Pacing Amplitude: 2.5 V
Lead Channel Setting Pacing Pulse Width: 0.4 ms
Lead Channel Setting Sensing Sensitivity: 2 mV

## 2020-03-16 ENCOUNTER — Other Ambulatory Visit: Payer: Self-pay

## 2020-03-16 ENCOUNTER — Encounter: Payer: Self-pay | Admitting: Emergency Medicine

## 2020-03-16 ENCOUNTER — Ambulatory Visit: Payer: Medicare Other | Admitting: Emergency Medicine

## 2020-03-16 DIAGNOSIS — R911 Solitary pulmonary nodule: Secondary | ICD-10-CM | POA: Diagnosis not present

## 2020-03-16 NOTE — Patient Instructions (Signed)
We will plan to repeat your CT scan of the chest in March 2022. Follow with Dr. Delton Coombes in March after the CT so that we can discuss the results together.

## 2020-03-16 NOTE — Progress Notes (Signed)
Remote pacemaker transmission.   

## 2020-03-16 NOTE — Progress Notes (Signed)
Subjective:    Patient ID: Sylvia Anthony, female    DOB: 1956-03-09, 64 y.o.   MRN: [id number]  HPI 64 year old woman, former minimal smoker (1 pack year) with a history of arterial fibromuscular dysplasia, hypertension, hypothyroidism, mitral valve prolapse, sick sinus syndrome with a pacemaker in place.   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   CT chest done at [facility] reviewed by me shows an irregular peripheral 1.9x1.2cm nodule PET scan 08/08/19 reviewed by me shows mild hypermetabolism of the nodule, measured at 1.4cm, a hypermetabolic subcarinal node, calcified R hilar node. No evidence distant disease  ACE level 07/15/19 >> 18 RA latex 2/15 >> negative  ROV 10/18/19 --follow-up visit for 64 year old woman with a history of arterial fibromuscular dysplasia, hypertension, mitral valve prolapse, sick sinus syndrome (with pacer), history of panuveitis.  I have followed her for a 1.9 x 1.2 cm nodule noted on PET scan 08/08/2019, mild hypermetabolism.  Also calcified right hilar node and no evidence of distant disease.  We had tentatively planned for bronchoscopy and biopsy but a repeat CT scan done 09/11/2019, reviewed by me shows some possible decrease in size in the 1.7 x 1.2 cm lesion.  She had stable mildly enlarged subcarinal right paratracheal lymph nodes.  Based on the findings we decided to defer her bronchoscopy and repeat her CT to look for interval change.  Her next scan should be in October. She is still following with optho for the uveitis - currently believed to be infectious.  No CP, no cough, no resp symptoms.   ROV 03/16/20 --64 year old woman with arterial fibromuscular dysplasia, hypertension, MVP, sick sinus syndrome with pacer, panuveitis.  We have been following a right peripheral lower lobe multilobulated nodule that measures 2.0 x 1.4 x 0.9 cm it has been overall stable in size.  It was a bit smaller  on a CT chest/14/21 and bronchoscopy was deferred based on that scan.  She now has had repeat scans done 12/16/2019 and 02/12/2020 that I have reviewed, showed that the nodular area persists but has not changed over time.    Review of Systems As per HPI  Past Medical History:  Diagnosis Date  . Anemia   . Arm numbness   . Arterial fibromuscular dysplasia (HCC)   . Carotid bruit   . Cataract    Mixed form OU  . CHF (congestive heart failure) (HCC) 1997   pacemaker  . Fibromyalgia   . GERD (gastroesophageal reflux disease)   . Glucose intolerance (impaired glucose tolerance)   . H. pylori infection Sept 2013   Amoxicillin, Biaxin  . Hyperlipidemia   . Hypertension   . Hypertensive retinopathy    OU  . Hypothyroidism   . Mitral valve prolapse   . Peripheral vascular disease (HCC)   . Rheumatic fever    age 64, was at [facility] for 17 weeks  . SSS (sick sinus syndrome) (HCC)      Family History  Problem Relation Age of Onset  . Hyperlipidemia Mother   . Hypertension Mother   . Heart disease Father   . Hypertension Father   . Hyperlipidemia Sister   . Hypertension Sister   . Colon polyps Sister   . Hyperlipidemia Brother   . Hypertension Brother   . Colon polyps Brother   . Colon cancer Neg Hx      Social History   Socioeconomic History  . Marital  status: Married    Spouse name: Not on file  . Number of children: 1  . Years of education: Not on file  . Highest education level: Not on file  Occupational History  . Occupation: part-time hairdresser  Tobacco Use  . Smoking status: Former Smoker    Packs/day: 0.25    Years: 4.00    Pack years: 1.00    Quit date: 10/09/1985    Years since quitting: 34.4  . Smokeless tobacco: Never Used  . Tobacco comment: just in teens  Substance and Sexual Activity  . Alcohol use: No    Alcohol/week: 0.0 standard drinks  . Drug use: No  . Sexual activity: Not on file  Other Topics Concern  . Not on file  Social  History Narrative   Lives in [place] with spouse.  Son is healthy at age 72.   Disabled         Social Determinants of Health   Financial Resource Strain:   . Difficulty of Paying Living Expenses: Not on file  Food Insecurity:   . Worried About Programme researcher, broadcasting/film/video in the Last Year: Not on file  . Ran Out of Food in the Last Year: Not on file  Transportation Needs:   . Lack of Transportation (Medical): Not on file  . Lack of Transportation (Non-Medical): Not on file  Physical Activity:   . Days of Exercise per Week: Not on file  . Minutes of Exercise per Session: Not on file  Stress:   . Feeling of Stress : Not on file  Social Connections:   . Frequency of Communication with Friends and Family: Not on file  . Frequency of Social Gatherings with Friends and Family: Not on file  . Attends Religious Services: Not on file  . Active Member of Clubs or Organizations: Not on file  . Attends Banker Meetings: Not on file  . Marital Status: Not on file  Intimate Partner Violence:   . Fear of Current or Ex-Partner: Not on file  . Emotionally Abused: Not on file  . Physically Abused: Not on file  . 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     Allergies  Allergen Reactions  . Gabapentin     SWELLING "OUT OF IT" 12/23/16     Outpatient Medications Prior to Visit  Medication Sig Dispense Refill  . albuterol (PROVENTIL HFA;VENTOLIN HFA) 108 (90 BASE) MCG/ACT inhaler Inhale 2 puffs into the lungs every 6 (six) hours as needed for wheezing or shortness of breath.    Marland Kitchen amLODipine (NORVASC) 10 MG tablet Take 10 mg by mouth daily.    Marland Kitchen aspirin EC 81 MG tablet Take 1 tablet (81 mg total) by mouth daily. 90 tablet 3  . Bromfenac Sodium (PROLENSA) 0.07 % SOLN Place 1 drop into both eyes 4 (four)  times daily.     . cetirizine (ZYRTEC) 10 MG tablet Take 10 mg by mouth at bedtime as needed for allergies.     Marland Kitchen diclofenac (VOLTAREN) 75 MG EC tablet Take 75 mg by mouth daily as needed for mild pain.    . dorzolamide-timolol (COSOPT) 22.3-6.8 MG/ML ophthalmic solution Place 1 drop into both eyes 3 (three) times daily. 10 mL 3  . levothyroxine (SYNTHROID, LEVOTHROID) 112 MCG tablet Take 112 mcg by  mouth daily before breakfast.     . metoprolol succinate (TOPROL-XL) 50 MG 24 hr tablet Take 50 mg by mouth daily.    . nitroGLYCERIN (NITROSTAT) 0.4 MG SL tablet Place 1 tablet (0.4 mg total) under the tongue every 5 (five) minutes as needed for chest pain. 25 tablet 3  . pantoprazole (PROTONIX) 40 MG tablet Take 1 tablet (40 mg total) by mouth 2 (two) times daily before a meal. 60 tablet 5  . potassium chloride SA (K-DUR,KLOR-CON) 20 MEQ tablet Take 20 mEq by mouth daily.     . rosuvastatin (CRESTOR) 20 MG tablet Take 20 mg by mouth daily.  6  . valsartan-hydrochlorothiazide (DIOVAN-HCT) 320-25 MG per tablet Take 1 tablet by mouth daily.      . brimonidine (ALPHAGAN) 0.2 % ophthalmic solution Place 1 drop into both eyes 3 (three) times daily. (Patient not taking: Reported on 03/16/2020) 15 mL 3  . prednisoLONE acetate (PRED FORTE) 1 % ophthalmic suspension Place 1 drop into both eyes 4 (four) times daily.  (Patient not taking: Reported on 03/16/2020)    . prednisoLONE acetate (PRED FORTE) 1 % ophthalmic suspension Place 1 drop into both eyes 4 (four) times daily. (Patient not taking: Reported on 03/16/2020) 10 mL 0   No facility-administered medications prior to visit.        Objective:   Physical Exam Vitals:   03/16/20 1338  BP: 140/80  Pulse: 72  Temp: 98.7 F (37.1 C)  TempSrc: Temporal  SpO2: 95%  Weight: 199 lb (90.3 kg)  Height: 5' 4.5" (1.638 m)   Gen: Pleasant, well-nourished, in no distress,  normal affect  ENT: No lesions,  mouth clear,  oropharynx clear, no postnasal  drip  Neck: No JVD, no stridor  Lungs: No use of accessory muscles, no crackles or wheezing on normal respiration, no wheeze on forced expiration  Cardiovascular: RRR, heart sounds normal, no murmur or gallops, no peripheral edema  Musculoskeletal: No deformities, no cyanosis or clubbing  Neuro: alert, awake, non focal  Skin: Warm, no lesions or ras     Assessment & Plan:  Pulmonary nodule 1 cm or greater in diameter Has been stable in size, in fact on some prior scans may eventually been smaller in some dimensions.  Still looks suspicious and clearly needs to be followed.  Discussed the possibility of a bronchoscopy going forward if there is an interval increase in size.  She understands.  We will repeat her CT scan in March 2022 then follow-up to discuss and plan next steps.  Levy Pupa, MD, PhD 03/16/2020, 1:52 PM [facility] [phone] or if no answer [phone]

## 2020-03-16 NOTE — Addendum Note (Signed)
Addended by: Dorisann Frames R on: 03/16/2020 02:20 PM   Modules accepted: Orders

## 2020-03-16 NOTE — Assessment & Plan Note (Signed)
Has been stable in size, in fact on some prior scans may eventually been smaller in some dimensions.  Still looks suspicious and clearly needs to be followed.  Discussed the possibility of a bronchoscopy going forward if there is an interval increase in size.  She understands.  We will repeat her CT scan in March 2022 then follow-up to discuss and plan next steps.

## 2020-03-27 DIAGNOSIS — E039 Hypothyroidism, unspecified: Secondary | ICD-10-CM | POA: Diagnosis not present

## 2020-03-27 DIAGNOSIS — I10 Essential (primary) hypertension: Secondary | ICD-10-CM | POA: Diagnosis not present

## 2020-03-27 DIAGNOSIS — K219 Gastro-esophageal reflux disease without esophagitis: Secondary | ICD-10-CM | POA: Diagnosis not present

## 2020-04-20 ENCOUNTER — Encounter (INDEPENDENT_AMBULATORY_CARE_PROVIDER_SITE_OTHER): Payer: Medicare Other | Admitting: Ophthalmology

## 2020-04-20 ENCOUNTER — Ambulatory Visit: Payer: Medicare Other | Admitting: Emergency Medicine

## 2020-04-20 DIAGNOSIS — H209 Unspecified iridocyclitis: Secondary | ICD-10-CM

## 2020-04-20 DIAGNOSIS — H25813 Combined forms of age-related cataract, bilateral: Secondary | ICD-10-CM

## 2020-04-20 DIAGNOSIS — H40053 Ocular hypertension, bilateral: Secondary | ICD-10-CM

## 2020-04-20 DIAGNOSIS — H44113 Panuveitis, bilateral: Secondary | ICD-10-CM

## 2020-04-20 DIAGNOSIS — H3581 Retinal edema: Secondary | ICD-10-CM

## 2020-04-20 DIAGNOSIS — H35353 Cystoid macular degeneration, bilateral: Secondary | ICD-10-CM

## 2020-04-20 DIAGNOSIS — H35033 Hypertensive retinopathy, bilateral: Secondary | ICD-10-CM

## 2020-04-20 DIAGNOSIS — R911 Solitary pulmonary nodule: Secondary | ICD-10-CM

## 2020-04-20 DIAGNOSIS — I10 Essential (primary) hypertension: Secondary | ICD-10-CM

## 2020-04-28 DIAGNOSIS — I10 Essential (primary) hypertension: Secondary | ICD-10-CM | POA: Diagnosis not present

## 2020-04-28 DIAGNOSIS — K219 Gastro-esophageal reflux disease without esophagitis: Secondary | ICD-10-CM | POA: Diagnosis not present

## 2020-04-28 DIAGNOSIS — E039 Hypothyroidism, unspecified: Secondary | ICD-10-CM | POA: Diagnosis not present

## 2020-05-17 ENCOUNTER — Emergency Department (HOSPITAL_COMMUNITY)
Admission: EM | Admit: 2020-05-17 | Discharge: 2020-05-17 | Disposition: A | Discharge status: Home / Self Care | Payer: Medicare Other | Attending: Emergency Medicine | Admitting: Emergency Medicine

## 2020-05-17 ENCOUNTER — Encounter (HOSPITAL_COMMUNITY): Payer: Self-pay | Admitting: Emergency Medicine

## 2020-05-17 ENCOUNTER — Other Ambulatory Visit: Payer: Self-pay

## 2020-05-17 ENCOUNTER — Emergency Department (HOSPITAL_COMMUNITY): Payer: Medicare Other

## 2020-05-17 DIAGNOSIS — E039 Hypothyroidism, unspecified: Secondary | ICD-10-CM | POA: Diagnosis not present

## 2020-05-17 DIAGNOSIS — I110 Hypertensive heart disease with heart failure: Secondary | ICD-10-CM | POA: Diagnosis not present

## 2020-05-17 DIAGNOSIS — R42 Dizziness and giddiness: Secondary | ICD-10-CM

## 2020-05-17 DIAGNOSIS — I509 Heart failure, unspecified: Secondary | ICD-10-CM | POA: Diagnosis not present

## 2020-05-17 DIAGNOSIS — Z7982 Long term (current) use of aspirin: Secondary | ICD-10-CM | POA: Diagnosis not present

## 2020-05-17 DIAGNOSIS — Z79899 Other long term (current) drug therapy: Secondary | ICD-10-CM | POA: Insufficient documentation

## 2020-05-17 DIAGNOSIS — Z87891 Personal history of nicotine dependence: Secondary | ICD-10-CM | POA: Insufficient documentation

## 2020-05-17 DIAGNOSIS — Z950 Presence of cardiac pacemaker: Secondary | ICD-10-CM | POA: Diagnosis not present

## 2020-05-17 LAB — URINALYSIS, ROUTINE W REFLEX MICROSCOPIC
Bacteria, UA: NONE SEEN
Bilirubin Urine: NEGATIVE
Glucose, UA: NEGATIVE mg/dL
Ketones, ur: 5 mg/dL — AB
Leukocytes,Ua: NEGATIVE
Nitrite: NEGATIVE
Protein, ur: NEGATIVE mg/dL
Specific Gravity, Urine: 1.012 (ref 1.005–1.030)
pH: 8 (ref 5.0–8.0)

## 2020-05-17 LAB — BASIC METABOLIC PANEL
Anion gap: 7 (ref 5–15)
BUN: 9 mg/dL (ref 8–23)
CO2: 30 mmol/L (ref 22–32)
Calcium: 9.8 mg/dL (ref 8.9–10.3)
Chloride: 103 mmol/L (ref 98–111)
Creatinine, Ser: 0.7 mg/dL (ref 0.44–1.00)
GFR, Estimated: >60 mL/min (ref >60)
Glucose, Bld: 98 mg/dL (ref 70–99)
Potassium: 3.5 mmol/L (ref 3.5–5.1)
Sodium: 140 mmol/L (ref 135–145)

## 2020-05-17 LAB — CBC WITH DIFFERENTIAL/PLATELET
Abs Immature Granulocytes: 0.02 10*3/uL (ref 0.00–0.07)
Basophils Absolute: 0 10*3/uL (ref 0.0–0.1)
Basophils Relative: 0 %
Eosinophils Absolute: 0.1 10*3/uL (ref 0.0–0.5)
Eosinophils Relative: 1 %
HCT: 47.2 % — ABNORMAL HIGH (ref 36.0–46.0)
Hemoglobin: 15 g/dL (ref 12.0–15.0)
Immature Granulocytes: 0 %
Lymphocytes Relative: 25 %
Lymphs Abs: 1.7 10*3/uL (ref 0.7–4.0)
MCH: 27.8 pg (ref 26.0–34.0)
MCHC: 31.8 g/dL (ref 30.0–36.0)
MCV: 87.6 fL (ref 80.0–100.0)
Monocytes Absolute: 0.6 10*3/uL (ref 0.1–1.0)
Monocytes Relative: 9 %
Neutro Abs: 4.4 10*3/uL (ref 1.7–7.7)
Neutrophils Relative %: 65 %
Platelets: 249 10*3/uL (ref 150–400)
RBC: 5.39 MIL/uL — ABNORMAL HIGH (ref 3.87–5.11)
RDW: 13.3 % (ref 11.5–15.5)
WBC: 6.8 10*3/uL (ref 4.0–10.5)
nRBC: 0 % (ref 0.0–0.2)

## 2020-05-17 LAB — TROPONIN I (HIGH SENSITIVITY): Troponin I (High Sensitivity): 9 ng/L (ref <18)

## 2020-05-17 MED ORDER — MECLIZINE HCL 12.5 MG PO TABS
25.0000 mg | ORAL_TABLET | Freq: Once | ORAL | Status: AC
  Administered 2020-05-17: 25 mg via ORAL
  Filled 2020-05-17: qty 2

## 2020-05-17 MED ORDER — ONDANSETRON 4 MG PO TBDP: ORAL_TABLET | ORAL | 0 refills | Status: DC

## 2020-05-17 MED ORDER — MECLIZINE HCL 25 MG PO TABS: 25.0000 mg | ORAL_TABLET | Freq: Three times a day (TID) | ORAL | 0 refills | Status: DC | PRN

## 2020-05-17 NOTE — ED Provider Notes (Signed)
[facility] EMERGENCY DEPARTMENT Provider Note   CSN: [id number] Arrival date & time: 05/17/20  1523     History Chief Complaint  Patient presents with  . Dizziness    Sylvia Anthony is a 64 y.o. female.  HPI      Sylvia Anthony is a 64 y.o. female with past medical history of anemia, hypertension, mitral valve prolapse and cardiac pacemaker who presents to the Emergency Department complaining of dizziness, nausea, vomiting, and hypertension.  Symptoms began upon waking at 5 AM this morning.  She states before she got out of bed she noticed a sensation of the room spinning that worsens upon her attempting to stand or walk.  Symptoms improve when lying still and closing her eyes.  Her symptoms are associated with two episodes of vomiting and she noticed that her blood pressure this morning was in the 200's systolic checked with a wrist cuff.  She noticed intermittent shooting pains from temple to temple that occurred after the dizziness began.  She reports history of recurrent headaches with similar shooting symptoms.  She denies chest pain, cough, significant shortness of breath, fever or chills.  No recent illness.  No recent medication changes.  No visual changes, facial or extremity weakness, or speech difficulties.    Past Medical History:  Diagnosis Date  . Anemia   . Arm numbness   . Arterial fibromuscular dysplasia (HCC)   . Carotid bruit   . Cataract    Mixed form OU  . CHF (congestive heart failure) (HCC) 1997   pacemaker  . Fibromyalgia   . GERD (gastroesophageal reflux disease)   . Glucose intolerance (impaired glucose tolerance)   . H. pylori infection Sept 2013   Amoxicillin, Biaxin  . Hyperlipidemia   . Hypertension   . Hypertensive retinopathy    OU  . Hypothyroidism   . Mitral valve prolapse   . Peripheral vascular disease (HCC)   . Rheumatic fever    age 76, was at [facility] for 17 weeks  . SSS (sick sinus syndrome) [facility])      Patient Active Problem List   Diagnosis Date Noted  . Pulmonary nodule 1 cm or greater in diameter 09/06/2019  . Dysphagia, pharyngoesophageal phase   . Hypertensive heart disease 09/15/2014  . Helicobacter pylori gastritis 08/05/2013  . Essential hypertension 07/08/2013  . Sick sinus syndrome (HCC) 07/08/2013  . Pacemaker 07/08/2013  . Neck pain, acute 06/20/2012  . Constipation 01/31/2012  . Hx of adenomatous colonic polyps 01/31/2012  . GERD (gastroesophageal reflux disease) 01/31/2012  . Esophageal dysphagia 01/31/2012  . Occlusion and stenosis of carotid artery without mention of cerebral infarction 05/18/2011    Past Surgical History:  Procedure Laterality Date  . ABDOMINAL HYSTERECTOMY     partial  . APPENDECTOMY    . CARDIAC CATHETERIZATION     multiple  . CHOLECYSTECTOMY    . COLONOSCOPY  08/01/2006   3 mm descending colon polyp removed/8 mm sessile ascending colon polyp removed / 3-mm rectal  polyp removed /Rare sigmoid diverticulosis/ Moderate internal hemorrhoids.Advanced adenoma on colonoscopy in March 2008.  The polyp was anadenomatous polyp with a foci of high-grade dysplasia  . COLONOSCOPY  09/08/2008   SIMPLE ADENOMA/HYPERPLASTIC POLY/Multiple colon polyps (ascending, sigmoid, rectal)  Mild sigmoid colon diverticulosis./ Small internal hemorrhoids  . COLONOSCOPY N/A 12/11/2017   Dr. [name]: diverticulosis, hemorrhoids next surveillance tcs 5 years.   . COLONOSCOPY WITH ESOPHAGOGASTRODUODENOSCOPY (EGD)  Sept. 30, 2013   XBW:IOMB diverticulosis was  noted in the sigmoid colon/The colon was otherwise normal/Small internal hemorrhoids/EGD:The mucosa of the esophagus appeared normal/Non-erosive gastritis (inflammation) was found; multiple bx/The duodenal mucosa showed no abnormalities. +H.pylori gastritis, treated with equivalent of prevpac. SAVARY DILATION  . ESOPHAGEAL DILATION N/A 10/07/2014   Procedure: ESOPHAGEAL DILATION;  Surgeon: West Bali, MD;  Location:  [facility];  Service: Endoscopy;  Laterality: N/A;  . ESOPHAGOGASTRODUODENOSCOPY N/A 10/07/2014   Dr. [name]: moderate non-erosive gastritis, no definite stricture, empiric dilation . negative H.pylori   . growth removed from intestine     [facility]-as teenager, done through colonoscopy  . PACEMAKER GENERATOR CHANGE  09/18/2006   generator change by Dr Amil Amen with a MDT Adapta L  . PACEMAKER INSERTION  09/13/1995   for sick sinus syndome and syncope at [facility]  . PPM GENERATOR CHANGEOUT N/A 06/11/2018    Medtronic Azure XT DR MRI SureScan model [id number] (serial number [id number]) pacemaker by Dr Johney Frame  . right Achilles tendon     X 2  . TONSILLECTOMY       OB History   No obstetric history on file.     Family History  Problem Relation Age of Onset  . Hyperlipidemia Mother   . Hypertension Mother   . Heart disease Father   . Hypertension Father   . Hyperlipidemia Sister   . Hypertension Sister   . Colon polyps Sister   . Hyperlipidemia Brother   . Hypertension Brother   . Colon polyps Brother   . Colon cancer Neg Hx     Social History   Tobacco Use  . Smoking status: Former Smoker    Packs/day: 0.25    Years: 4.00    Pack years: 1.00    Quit date: 10/09/1985    Years since quitting: 34.6  . Smokeless tobacco: Never Used  . Tobacco comment: just in teens  Vaping Use  . Vaping Use: Never used  Substance Use Topics  . Alcohol use: No    Alcohol/week: 0.0 standard drinks  . Drug use: No    Home Medications Prior to Admission medications   Medication Sig Start Date End Date Taking? Authorizing Provider  albuterol (PROVENTIL HFA;VENTOLIN HFA) 108 (90 BASE) MCG/ACT inhaler Inhale 2 puffs into the lungs every 6 (six) hours as needed for wheezing or shortness of breath.    [provider]  amLODipine (NORVASC) 10 MG tablet Take 10 mg by mouth daily. 02/18/20   [provider]  aspirin EC 81 MG tablet Take 1 tablet (81 mg total) by mouth daily.  06/22/18   Seiler, Amber K, NP  brimonidine (ALPHAGAN) 0.2 % ophthalmic solution Place 1 drop into both eyes 3 (three) times daily. Patient not taking: Reported on 03/16/2020 03/09/20 03/09/21  Rennis Chris, MD  Bromfenac Sodium (PROLENSA) 0.07 % SOLN Place 1 drop into both eyes 4 (four) times daily.     [provider]  cetirizine (ZYRTEC) 10 MG tablet Take 10 mg by mouth at bedtime as needed for allergies.     [provider]  diclofenac (VOLTAREN) 75 MG EC tablet Take 75 mg by mouth daily as needed for mild pain.    [provider]  dorzolamide-timolol (COSOPT) 22.3-6.8 MG/ML ophthalmic solution Place 1 drop into both eyes 3 (three) times daily. 03/09/20 03/09/21  Rennis Chris, MD  levothyroxine (SYNTHROID, LEVOTHROID) 112 MCG tablet Take 112 mcg by mouth daily before breakfast.  12/12/17   [provider]  metoprolol succinate (TOPROL-XL) 50 MG 24  hr tablet Take 50 mg by mouth daily. 06/21/19   [provider]  nitroGLYCERIN (NITROSTAT) 0.4 MG SL tablet Place 1 tablet (0.4 mg total) under the tongue every 5 (five) minutes as needed for chest pain. 08/15/16   Lyn RecordsSmith, Henry W, MD  pantoprazole (PROTONIX) 40 MG tablet Take 1 tablet (40 mg total) by mouth 2 (two) times daily before a meal. 09/03/14   Tiffany KocherLewis, Leslie S, PA-C  potassium chloride SA (K-DUR,KLOR-CON) 20 MEQ tablet Take 20 mEq by mouth daily.     [provider]  prednisoLONE acetate (PRED FORTE) 1 % ophthalmic suspension Place 1 drop into both eyes 4 (four) times daily.  Patient not taking: Reported on 03/16/2020    [provider]  prednisoLONE acetate (PRED FORTE) 1 % ophthalmic suspension Place 1 drop into both eyes 4 (four) times daily. Patient not taking: Reported on 03/16/2020 01/06/20   Rennis ChrisZamora, Brian, MD  rosuvastatin (CRESTOR) 20 MG tablet Take 20 mg by mouth daily. 08/12/16   [provider]  valsartan-hydrochlorothiazide (DIOVAN-HCT) 320-25 MG per tablet Take 1  tablet by mouth daily.      [provider]    Allergies    Gabapentin  Review of Systems   Review of Systems  Constitutional: Negative for chills, fatigue and fever.  HENT: Negative for ear pain, sinus pressure and trouble swallowing.   Eyes: Negative for visual disturbance.  Respiratory: Negative for cough, shortness of breath and wheezing.   Cardiovascular: Negative for chest pain and palpitations.  Gastrointestinal: Negative for abdominal pain, nausea and vomiting.  Genitourinary: Negative for dysuria and flank pain.  Musculoskeletal: Negative for arthralgias, back pain, myalgias, neck pain and neck stiffness.  Skin: Negative for rash.  Neurological: Positive for dizziness and headaches. Negative for syncope, facial asymmetry, speech difficulty, weakness and numbness.  Hematological: Does not bruise/bleed easily.  Psychiatric/Behavioral: Negative for confusion and decreased concentration.    Physical Exam Updated Vital Signs BP (!) 188/76   Pulse 61   Temp 98.2 F (36.8 C) (Oral)   Resp 17   Ht 5\' 4"  (1.626 m)   Wt 89.8 kg   SpO2 99%   BMI 33.99 kg/m   Physical Exam Vitals and nursing note reviewed.  Constitutional:      General: She is not in acute distress.    Appearance: Normal appearance.  HENT:     Head: Atraumatic.     Mouth/Throat:     Mouth: Mucous membranes are moist.  Eyes:     Extraocular Movements: Extraocular movements intact.     Conjunctiva/sclera: Conjunctivae normal.     Pupils: Pupils are equal, round, and reactive to light.  Cardiovascular:     Rate and Rhythm: Normal rate and regular rhythm.     Pulses: Normal pulses.  Pulmonary:     Effort: Pulmonary effort is normal.     Breath sounds: Normal breath sounds.  Chest:     Chest wall: No tenderness.  Abdominal:     General: There is no distension.     Palpations: Abdomen is soft.     Tenderness: There is no abdominal tenderness.  Musculoskeletal:     Cervical back: Normal  range of motion. No rigidity or tenderness.     Right lower leg: No edema.     Left lower leg: No edema.  Skin:    General: Skin is warm.     Capillary Refill: Capillary refill takes less than 2 seconds.  Neurological:     General:  No focal deficit present.     Mental Status: She is alert.     Sensory: Sensation is intact. No sensory deficit.     Motor: Motor function is intact. No weakness.     Coordination: Coordination is intact.     Comments: CN II-XII intact.  Speech clear.  No pronator drift.  Normal finger-nose, heel shin testing.  Facial droop.  Psychiatric:        Thought Content: Thought content normal.     ED Results / Procedures / Treatments   Labs (all labs ordered are listed, but only abnormal results are displayed) Labs Reviewed  CBC WITH DIFFERENTIAL/PLATELET - Abnormal; Notable for the following components:      Result Value   RBC 5.39 (*)    HCT 47.2 (*)    All other components within normal limits  BASIC METABOLIC PANEL  URINALYSIS, ROUTINE W REFLEX MICROSCOPIC  TROPONIN I (HIGH SENSITIVITY)    EKG EKG Interpretation  Date/Time:  Sunday May 17 2020 15:56:21 EST Ventricular Rate:  63 PR Interval:    QRS Duration: 79 QT Interval:  445 QTC Calculation: 456 R Axis:   58 Text Interpretation: Sinus rhythm Short PR interval Abnormal R-wave progression, early transition Abnormal T, consider ischemia, diffuse leads Confirmed by Bethann Berkshire [phone]) on 05/17/2020 6:26:55 PM   Radiology CT Head Wo Contrast  Result Date: 05/17/2020 CLINICAL DATA:  Dizziness, nausea and vomiting, hypertension EXAM: CT HEAD WITHOUT CONTRAST TECHNIQUE: Contiguous axial images were obtained from the base of the skull through the vertex without intravenous contrast. COMPARISON:  07/12/2013 FINDINGS: Brain: No acute infarct or hemorrhage. Lateral ventricles and midline structures are unremarkable. No acute extra-axial fluid collections. No mass effect. Vascular: No hyperdense  vessel or unexpected calcification. Skull: Normal. Negative for fracture or focal lesion. Sinuses/Orbits: No acute finding. Other: None. IMPRESSION: 1. No acute intracranial process. Electronically Signed   By: Sharlet Salina M.D.   On: 05/17/2020 16:59    Procedures Procedures (including critical care time)  Medications Ordered in ED Medications  meclizine (ANTIVERT) tablet 25 mg (has no administration in time range)    ED Course  I have reviewed the triage vital signs and the nursing notes.  Pertinent labs & imaging results that were available during my care of the patient were reviewed by me and considered in my medical decision making (see chart for details).    MDM Rules/Calculators/A&P                           Patient here with complaints of dizziness, nausea and 2 episodes of vomiting that occurred upon waking this morning.  Symptoms described as vertiginous.  Reports elevated blood pressure this morning as well.  Systolic pressure here less than 200.  No focal neuro deficits on exam.  Labs unremarkable, CT head reassuring.  Orthostatic vital signs ordered, but patient unable to stand due to her dizziness.  No orthostatic hypotension from lying to sitting.  EKG shows abnormal R wave progression compared to previous EKG.  Patient does not have shortness of breath or chest pain, troponin unremarkable.  Patient's current symptoms are not felt to be cardiac in nature.  Feel that she can follow-up with her cardiologist this week regarding this.    On recheck, pt has tolerated crackers and oral fluids.  No vomiting and she reports feeling better.  I feel that her symptoms are related to vertigo.  She agrees to d/c home and  rx written for meclizine.  Return precautions discussed.   Final Clinical Impression(s) / ED Diagnoses Final diagnoses:  Vertigo    Rx / DC Orders ED Discharge Orders    None       Rosey Bath 05/17/20 Patience Musca, MD 05/17/20  [id number]

## 2020-05-17 NOTE — Discharge Instructions (Addendum)
Your dizziness symptoms this evening are likely related to vertigo.  This should improve within a few days.  You have been prescribed a medication that will help with your symptoms.  Continue to take your regular medications as directed.  Follow-up with your cardiologist as discussed.  Return to the emergency department if you develop any worsening symptoms.

## 2020-05-17 NOTE — ED Triage Notes (Signed)
Pt complains of dizziness N/V and hypertension that started this morning at 0500.  Pt states she has intermittent shooting pains in the anterior portion of her head.

## 2020-06-10 ENCOUNTER — Ambulatory Visit (INDEPENDENT_AMBULATORY_CARE_PROVIDER_SITE_OTHER): Payer: Medicare Other

## 2020-06-10 DIAGNOSIS — I495 Sick sinus syndrome: Secondary | ICD-10-CM | POA: Diagnosis not present

## 2020-06-10 LAB — CUP PACEART REMOTE DEVICE CHECK
Battery Remaining Longevity: 148 mo
Battery Voltage: 3.03 V
Brady Statistic AP VP Percent: 0.04 %
Brady Statistic AP VS Percent: 88.16 %
Brady Statistic AS VP Percent: 0.01 %
Brady Statistic AS VS Percent: 11.79 %
Brady Statistic RA Percent Paced: 88.33 %
Brady Statistic RV Percent Paced: 0.04 %
Date Time Interrogation Session: 20220112022642
Implantable Lead Implant Date: 19970416
Implantable Lead Implant Date: 19970416
Implantable Lead Location: 753859
Implantable Lead Location: 753860
Implantable Lead Model: 5034
Implantable Lead Model: 5534
Implantable Pulse Generator Implant Date: 20200113
Lead Channel Impedance Value: 646 Ohm
Lead Channel Impedance Value: 665 Ohm
Lead Channel Impedance Value: 665 Ohm
Lead Channel Impedance Value: 760 Ohm
Lead Channel Pacing Threshold Amplitude: 0.625 V
Lead Channel Pacing Threshold Amplitude: 0.875 V
Lead Channel Pacing Threshold Pulse Width: 0.4 ms
Lead Channel Pacing Threshold Pulse Width: 0.4 ms
Lead Channel Sensing Intrinsic Amplitude: 1.875 mV
Lead Channel Sensing Intrinsic Amplitude: 1.875 mV
Lead Channel Sensing Intrinsic Amplitude: 18.875 mV
Lead Channel Sensing Intrinsic Amplitude: 18.875 mV
Lead Channel Setting Pacing Amplitude: 1.5 V
Lead Channel Setting Pacing Amplitude: 2.5 V
Lead Channel Setting Pacing Pulse Width: 0.4 ms
Lead Channel Setting Sensing Sensitivity: 2 mV

## 2020-06-17 DIAGNOSIS — Z299 Encounter for prophylactic measures, unspecified: Secondary | ICD-10-CM | POA: Diagnosis not present

## 2020-06-17 DIAGNOSIS — I700 Atherosclerosis of aorta: Secondary | ICD-10-CM | POA: Diagnosis not present

## 2020-06-17 DIAGNOSIS — R42 Dizziness and giddiness: Secondary | ICD-10-CM | POA: Diagnosis not present

## 2020-06-17 DIAGNOSIS — I10 Essential (primary) hypertension: Secondary | ICD-10-CM | POA: Diagnosis not present

## 2020-06-22 ENCOUNTER — Ambulatory Visit: Payer: Medicare Other | Admitting: Interventional Cardiology

## 2020-06-22 ENCOUNTER — Other Ambulatory Visit: Payer: Self-pay

## 2020-06-22 ENCOUNTER — Encounter: Payer: Self-pay | Admitting: Interventional Cardiology

## 2020-06-22 VITALS — BP 128/82 | HR 82 | Ht 64.5 in | Wt 199.1 lb

## 2020-06-22 DIAGNOSIS — R9431 Abnormal electrocardiogram [ECG] [EKG]: Secondary | ICD-10-CM | POA: Diagnosis not present

## 2020-06-22 DIAGNOSIS — R0600 Dyspnea, unspecified: Secondary | ICD-10-CM

## 2020-06-22 DIAGNOSIS — Z950 Presence of cardiac pacemaker: Secondary | ICD-10-CM

## 2020-06-22 DIAGNOSIS — Z7189 Other specified counseling: Secondary | ICD-10-CM

## 2020-06-22 DIAGNOSIS — I495 Sick sinus syndrome: Secondary | ICD-10-CM | POA: Diagnosis not present

## 2020-06-22 DIAGNOSIS — R0609 Other forms of dyspnea: Secondary | ICD-10-CM

## 2020-06-22 DIAGNOSIS — I10 Essential (primary) hypertension: Secondary | ICD-10-CM

## 2020-06-22 NOTE — Patient Instructions (Signed)
Medication Instructions:  Your physician recommends that you continue on your current medications as directed. Please refer to the Current Medication list given to you today.  *If you need a refill on your cardiac medications before your next appointment, please call your pharmacy*   Lab Work: None If you have labs (blood work) drawn today and your tests are completely normal, you will receive your results only by: Marland Kitchen MyChart Message (if you have MyChart) OR . A paper copy in the mail If you have any lab test that is abnormal or we need to change your treatment, we will call you to review the results.   Testing/Procedures: None   Follow-Up: At [facility], you and your health needs are our priority.  As part of our continuing mission to provide you with exceptional heart care, we have created designated Provider Care Teams.  These Care Teams include your primary Cardiologist (physician) and Advanced Practice Providers (APPs -  Physician Assistants and Nurse Practitioners) who all work together to provide you with the care you need, when you need it.  We recommend signing up for the patient portal called "MyChart".  Sign up information is provided on this After Visit Summary.  MyChart is used to connect with patients for Virtual Visits (Telemedicine).  Patients are able to view lab/test results, encounter notes, upcoming appointments, etc.  Non-urgent messages can be sent to your provider as well.   To learn more about what you can do with MyChart, go to [email or web address].    Your next appointment:   1 year(s)  The format for your next appointment:   In Person  Provider:   You may see Dr. Verdis Prime or one of the following Advanced Practice Providers on your designated Care Team:    Nada Boozer, NP  Georgie Chard, NP    Other Instructions

## 2020-06-22 NOTE — Progress Notes (Signed)
Cardiology Office Note:    Date:  06/22/2020   ID:  Sylvia Anthony, DOB 06/17/1955, MRN [id number]  PCP:  Ignatius Specking, MD  Cardiologist:  No primary care provider on file.   Referring MD: Ignatius Specking, MD   Chief Complaint  Patient presents with  . Hypertension  . Follow-up    Vertigo emergency room visit    History of Present Illness:    Sylvia Anthony is a 65 y.o. female with a hx of SSS, PPM, hypertension, CVA, and chronic dyspnea.  May 17, 2020, she was seen in the emergency room at [facility] having headache and vertigo.  She later had tinnitus.  She was told during the emergency room visit that there was an abnormality with her heart rhythm.  They recommended that she see cardiology.  Overdue for her annual visit.  EKG done revealed precordial T wave abnormality which likely represents post pacing repolarization.  She has experienced dyspnea on exertion.  There are 17 stairs at her house the cause of shortness of breath with physical activity.  This is unchanged over the pandemic.  Past Medical History:  Diagnosis Date  . Anemia   . Arm numbness   . Arterial fibromuscular dysplasia (HCC)   . Carotid bruit   . Cataract    Mixed form OU  . CHF (congestive heart failure) (HCC) 1997   pacemaker  . Fibromyalgia   . GERD (gastroesophageal reflux disease)   . Glucose intolerance (impaired glucose tolerance)   . H. pylori infection Sept 2013   Amoxicillin, Biaxin  . Hyperlipidemia   . Hypertension   . Hypertensive retinopathy    OU  . Hypothyroidism   . Mitral valve prolapse   . Peripheral vascular disease (HCC)   . Rheumatic fever    age 53, was at [facility] for 17 weeks  . SSS (sick sinus syndrome) [facility])     Past Surgical History:  Procedure Laterality Date  . ABDOMINAL HYSTERECTOMY     partial  . APPENDECTOMY    . CARDIAC CATHETERIZATION     multiple  . CHOLECYSTECTOMY    . COLONOSCOPY  08/01/2006   3 mm descending colon polyp  removed/8 mm sessile ascending colon polyp removed / 3-mm rectal  polyp removed /Rare sigmoid diverticulosis/ Moderate internal hemorrhoids.Advanced adenoma on colonoscopy in March 2008.  The polyp was anadenomatous polyp with a foci of high-grade dysplasia  . COLONOSCOPY  09/08/2008   SIMPLE ADENOMA/HYPERPLASTIC POLY/Multiple colon polyps (ascending, sigmoid, rectal)  Mild sigmoid colon diverticulosis./ Small internal hemorrhoids  . COLONOSCOPY N/A 12/11/2017   Dr. [name]: diverticulosis, hemorrhoids next surveillance tcs 5 years.   . COLONOSCOPY WITH ESOPHAGOGASTRODUODENOSCOPY (EGD)  Sept. 30, 2013   HOZ:YYQM diverticulosis was noted in the sigmoid colon/The colon was otherwise normal/Small internal hemorrhoids/EGD:The mucosa of the esophagus appeared normal/Non-erosive gastritis (inflammation) was found; multiple bx/The duodenal mucosa showed no abnormalities. +H.pylori gastritis, treated with equivalent of prevpac. SAVARY DILATION  . ESOPHAGEAL DILATION N/A 10/07/2014   Procedure: ESOPHAGEAL DILATION;  Surgeon: West Bali, MD;  Location: [facility] SUITE;  Service: Endoscopy;  Laterality: N/A;  . ESOPHAGOGASTRODUODENOSCOPY N/A 10/07/2014   Dr. [name]: moderate non-erosive gastritis, no definite stricture, empiric dilation . negative H.pylori   . growth removed from intestine     [facility]-as teenager, done through colonoscopy  . PACEMAKER GENERATOR CHANGE  09/18/2006   generator change by Dr Amil Amen with a MDT Adapta L  . PACEMAKER INSERTION  09/13/1995  for sick sinus syndome and syncope at [facility]  . PPM GENERATOR CHANGEOUT N/A 06/11/2018    Medtronic Azure XT DR MRI SureScan model [id number] (serial number [id number]) pacemaker by Dr Johney Frame  . right Achilles tendon     X 2  . TONSILLECTOMY      Current Medications: Current Meds  Medication Sig  . acetaminophen (TYLENOL) 500 MG tablet Take 500 mg by mouth every 6 (six) hours as needed.  Marland Kitchen albuterol (PROVENTIL HFA;VENTOLIN HFA) 108  (90 BASE) MCG/ACT inhaler Inhale 2 puffs into the lungs every 6 (six) hours as needed for wheezing or shortness of breath.  Marland Kitchen aspirin EC 81 MG tablet Take 1 tablet (81 mg total) by mouth daily.  . brimonidine (ALPHAGAN) 0.2 % ophthalmic solution Place 1 drop into both eyes 3 (three) times daily.  . cetirizine (ZYRTEC) 10 MG tablet Take 10 mg by mouth at bedtime as needed for allergies.   Marland Kitchen diclofenac (VOLTAREN) 75 MG EC tablet Take 75 mg by mouth daily as needed for mild pain.  . dorzolamide-timolol (COSOPT) 22.3-6.8 MG/ML ophthalmic solution Place 1 drop into both eyes 3 (three) times daily.  Marland Kitchen levothyroxine (SYNTHROID, LEVOTHROID) 112 MCG tablet Take 112 mcg by mouth daily before breakfast.   . meclizine (ANTIVERT) 25 MG tablet Take 1 tablet (25 mg total) by mouth 3 (three) times daily as needed for dizziness.  . metoprolol succinate (TOPROL-XL) 50 MG 24 hr tablet Take 50 mg by mouth daily.  . nitroGLYCERIN (NITROSTAT) 0.4 MG SL tablet Place 1 tablet (0.4 mg total) under the tongue every 5 (five) minutes as needed for chest pain.  Marland Kitchen ondansetron (ZOFRAN ODT) 4 MG disintegrating tablet 4mg  ODT q4 hours prn nausea/vomit  . pantoprazole (PROTONIX) 40 MG tablet Take 1 tablet (40 mg total) by mouth 2 (two) times daily before a meal.  . potassium chloride SA (K-DUR,KLOR-CON) 20 MEQ tablet Take 20 mEq by mouth daily.  . rosuvastatin (CRESTOR) 20 MG tablet Take 20 mg by mouth daily.  . valsartan-hydrochlorothiazide (DIOVAN-HCT) 320-25 MG per tablet Take 1 tablet by mouth daily.     Allergies:   Gabapentin   Social History   Socioeconomic History  . Marital status: Married    Spouse name: Not on file  . Number of children: 1  . Years of education: Not on file  . Highest education level: Not on file  Occupational History  . Occupation: part-time hairdresser  Tobacco Use  . Smoking status: Former Smoker    Packs/day: 0.25    Years: 4.00    Pack years: 1.00    Quit date: 10/09/1985    Years  since quitting: 34.7  . Smokeless tobacco: Never Used  . Tobacco comment: just in teens  Vaping Use  . Vaping Use: Never used  Substance and Sexual Activity  . Alcohol use: No    Alcohol/week: 0.0 standard drinks  . Drug use: No  . Sexual activity: Not on file  Other Topics Concern  . Not on file  Social History Narrative   Lives in [place] with spouse.  Son is healthy at age 25.   Disabled         Social Determinants of Health   Financial Resource Strain: Not on file  Food Insecurity: Not on file  Transportation Needs: Not on file  Physical Activity: Not on file  Stress: Not on file  Social Connections: Not on file     Family History: The patient's family history includes Colon  polyps in her brother and sister; Heart disease in her father; Hyperlipidemia in her brother, mother, and sister; Hypertension in her brother, father, mother, and sister. There is no history of Colon cancer.  ROS:   Please see the history of present illness.    She had COVID-19 in 2020 prior to vaccinations.  She does have dyspnea on exertion and admits that she is physically deconditioned.  All other systems reviewed and are negative.  EKGs/Labs/Other Studies Reviewed:    The following studies were reviewed today: I reviewed the data from the emergency room visit for headache and vertigo.  No cardiac abnormalities noted other than the EKG.  EKG:  EKG performed on May 18, 2020, demonstrates normal sinus rhythm with left ventricular hypertrophy and strain.  She was then sinus rhythm without ventricular pacing.  Recent Labs: 07/15/2019: ALT 10 05/17/2020: BUN 9; Creatinine, Ser 0.70; Hemoglobin 15.0; Platelets 249; Potassium 3.5; Sodium 140  Recent Lipid Panel No results found for: CHOL, TRIG, HDL, CHOLHDL, VLDL, LDLCALC, LDLDIRECT  Physical Exam:    VS:  BP 128/82   Pulse 82   Ht 5' 4.5" (1.638 m)   Wt 199 lb 2 oz (90.3 kg)   SpO2 97%   BMI 33.65 kg/m     Wt Readings from Last 3  Encounters:  06/22/20 199 lb 2 oz (90.3 kg)  05/17/20 198 lb (89.8 kg)  03/16/20 199 lb (90.3 kg)     GEN: Moderate obesity. No acute distress HEENT: Normal NECK: No JVD. LYMPHATICS: No lymphadenopathy CARDIAC: No murmur. RRR no gallop, or edema. VASCULAR:  Normal Pulses. No bruits. RESPIRATORY:  Clear to auscultation without rales, wheezing or rhonchi  ABDOMEN: Soft, non-tender, non-distended, No pulsatile mass, MUSCULOSKELETAL: No deformity  SKIN: Warm and dry NEUROLOGIC:  Alert and oriented x 3 PSYCHIATRIC:  Normal affect   ASSESSMENT:    1. Nonspecific abnormal electrocardiogram (ECG) (EKG)   2. Sick sinus syndrome (HCC)   3. Essential hypertension   4. Pacemaker   5. Educated about COVID-19 virus infection   6. DOE (dyspnea on exertion)    PLAN:    In order of problems listed above:  1. The EKG reveals repolarization abnormality that I believe are related to post pacing.  No action is required. 2. Normal pacemaker function when last evaluated. 3. Blood pressure this morning seems to be doing unusually good.  She monitors it at home and is compliant with medical therapy.  We discussed low-salt diet, less than 2.8 g/day and physical activity. 4. Pacemaker follow-up is arranged. 5. She is vaccinated and had Covid in October 2020. 6. Dyspnea on exertion is likely related to physical deconditioning.  Encouraged aerobic activity, greater than 150 minutes of moderate activity per week.   Medication Adjustments/Labs and Tests Ordered: Current medicines are reviewed at length with the patient today.  Concerns regarding medicines are outlined above.  No orders of the defined types were placed in this encounter.  No orders of the defined types were placed in this encounter.   Patient Instructions  Medication Instructions:  Your physician recommends that you continue on your current medications as directed. Please refer to the Current Medication list given to you  today.  *If you need a refill on your cardiac medications before your next appointment, please call your pharmacy*   Lab Work: None If you have labs (blood work) drawn today and your tests are completely normal, you will receive your results only by: Marland Kitchen. MyChart Message (if you have  MyChart) OR . A paper copy in the mail If you have any lab test that is abnormal or we need to change your treatment, we will call you to review the results.   Testing/Procedures: None   Follow-Up: At [facility], you and your health needs are our priority.  As part of our continuing mission to provide you with exceptional heart care, we have created designated Provider Care Teams.  These Care Teams include your primary Cardiologist (physician) and Advanced Practice Providers (APPs -  Physician Assistants and Nurse Practitioners) who all work together to provide you with the care you need, when you need it.  We recommend signing up for the patient portal called "MyChart".  Sign up information is provided on this After Visit Summary.  MyChart is used to connect with patients for Virtual Visits (Telemedicine).  Patients are able to view lab/test results, encounter notes, upcoming appointments, etc.  Non-urgent messages can be sent to your provider as well.   To learn more about what you can do with MyChart, go to [email or web address].    Your next appointment:   1 year(s)  The format for your next appointment:   In Person  Provider:   You may see Dr. Verdis Prime or one of the following Advanced Practice Providers on your designated Care Team:    Nada Boozer, NP  Georgie Chard, NP    Other Instructions      Signed, Lesleigh Noe, MD  06/22/2020 8:35 AM    [facility]

## 2020-06-23 NOTE — Progress Notes (Signed)
Remote pacemaker transmission.   

## 2020-06-29 DIAGNOSIS — I10 Essential (primary) hypertension: Secondary | ICD-10-CM | POA: Diagnosis not present

## 2020-06-29 DIAGNOSIS — K219 Gastro-esophageal reflux disease without esophagitis: Secondary | ICD-10-CM | POA: Diagnosis not present

## 2020-06-29 DIAGNOSIS — E039 Hypothyroidism, unspecified: Secondary | ICD-10-CM | POA: Diagnosis not present

## 2020-06-30 DIAGNOSIS — H1131 Conjunctival hemorrhage, right eye: Secondary | ICD-10-CM | POA: Diagnosis not present

## 2020-06-30 DIAGNOSIS — I10 Essential (primary) hypertension: Secondary | ICD-10-CM | POA: Diagnosis not present

## 2020-06-30 DIAGNOSIS — Z299 Encounter for prophylactic measures, unspecified: Secondary | ICD-10-CM | POA: Diagnosis not present

## 2020-07-02 ENCOUNTER — Ambulatory Visit: Payer: Medicare Other | Admitting: Cardiology

## 2020-07-27 DIAGNOSIS — I10 Essential (primary) hypertension: Secondary | ICD-10-CM | POA: Diagnosis not present

## 2020-07-27 DIAGNOSIS — E039 Hypothyroidism, unspecified: Secondary | ICD-10-CM | POA: Diagnosis not present

## 2020-07-27 DIAGNOSIS — I700 Atherosclerosis of aorta: Secondary | ICD-10-CM | POA: Diagnosis not present

## 2020-07-27 DIAGNOSIS — R059 Cough, unspecified: Secondary | ICD-10-CM | POA: Diagnosis not present

## 2020-07-27 DIAGNOSIS — Z299 Encounter for prophylactic measures, unspecified: Secondary | ICD-10-CM | POA: Diagnosis not present

## 2020-07-27 DIAGNOSIS — K219 Gastro-esophageal reflux disease without esophagitis: Secondary | ICD-10-CM | POA: Diagnosis not present

## 2020-07-27 DIAGNOSIS — J209 Acute bronchitis, unspecified: Secondary | ICD-10-CM | POA: Diagnosis not present

## 2020-08-11 ENCOUNTER — Ambulatory Visit (INDEPENDENT_AMBULATORY_CARE_PROVIDER_SITE_OTHER)
Admission: RE | Admit: 2020-08-11 | Discharge: 2020-08-11 | Disposition: A | Discharge status: Home / Self Care | Payer: Medicare Other | Source: Ambulatory Visit | Attending: Emergency Medicine | Admitting: Emergency Medicine

## 2020-08-11 ENCOUNTER — Other Ambulatory Visit: Payer: Self-pay

## 2020-08-11 DIAGNOSIS — R911 Solitary pulmonary nodule: Secondary | ICD-10-CM | POA: Diagnosis not present

## 2020-08-11 DIAGNOSIS — R918 Other nonspecific abnormal finding of lung field: Secondary | ICD-10-CM | POA: Diagnosis not present

## 2020-08-11 DIAGNOSIS — I2510 Atherosclerotic heart disease of native coronary artery without angina pectoris: Secondary | ICD-10-CM | POA: Diagnosis not present

## 2020-08-11 DIAGNOSIS — J984 Other disorders of lung: Secondary | ICD-10-CM | POA: Diagnosis not present

## 2020-08-11 DIAGNOSIS — I700 Atherosclerosis of aorta: Secondary | ICD-10-CM | POA: Diagnosis not present

## 2020-08-25 ENCOUNTER — Encounter: Payer: Self-pay | Admitting: *Deleted

## 2020-08-25 DIAGNOSIS — R5383 Other fatigue: Secondary | ICD-10-CM | POA: Diagnosis not present

## 2020-08-25 DIAGNOSIS — I10 Essential (primary) hypertension: Secondary | ICD-10-CM | POA: Diagnosis not present

## 2020-08-25 DIAGNOSIS — E039 Hypothyroidism, unspecified: Secondary | ICD-10-CM | POA: Diagnosis not present

## 2020-08-25 DIAGNOSIS — Z7189 Other specified counseling: Secondary | ICD-10-CM | POA: Diagnosis not present

## 2020-08-25 DIAGNOSIS — Z789 Other specified health status: Secondary | ICD-10-CM | POA: Diagnosis not present

## 2020-08-25 DIAGNOSIS — E7800 Pure hypercholesterolemia, unspecified: Secondary | ICD-10-CM | POA: Diagnosis not present

## 2020-08-25 DIAGNOSIS — Z0000 Encounter for general adult medical examination without abnormal findings: Secondary | ICD-10-CM | POA: Diagnosis not present

## 2020-08-25 DIAGNOSIS — Z299 Encounter for prophylactic measures, unspecified: Secondary | ICD-10-CM | POA: Diagnosis not present

## 2020-08-25 DIAGNOSIS — Z79899 Other long term (current) drug therapy: Secondary | ICD-10-CM | POA: Diagnosis not present

## 2020-08-27 DIAGNOSIS — I10 Essential (primary) hypertension: Secondary | ICD-10-CM | POA: Diagnosis not present

## 2020-09-01 ENCOUNTER — Other Ambulatory Visit: Payer: Self-pay

## 2020-09-01 ENCOUNTER — Encounter: Payer: Self-pay | Admitting: Emergency Medicine

## 2020-09-01 ENCOUNTER — Ambulatory Visit: Payer: Medicare Other | Admitting: Emergency Medicine

## 2020-09-01 DIAGNOSIS — R911 Solitary pulmonary nodule: Secondary | ICD-10-CM

## 2020-09-01 NOTE — Addendum Note (Signed)
Addended by: Dorisann Frames R on: 09/01/2020 09:35 AM   Modules accepted: Orders

## 2020-09-01 NOTE — Assessment & Plan Note (Signed)
The pulmonary nodule is still present, possibly slightly less prominent on most recent scan from March.  We have about 1 year of stability and her next scan will need to be in September.  I will follow up with her after to review.  We will plan to repeat your CT scan of the chest in September 2022. Please let us know if you have any trouble with breathing, cough, chest discomfort. Follow Dr. Delton Coombes in September after your CT so that we can review the results together.

## 2020-09-01 NOTE — Progress Notes (Signed)
Subjective:    Patient ID: Sylvia Anthony, female    DOB: [birth year], 65 y.o.   MRN: [id number]  HPI 65 year old woman, former minimal smoker (1 pack year) with a history of arterial fibromuscular dysplasia, hypertension, hypothyroidism, mitral valve prolapse, sick sinus syndrome with a pacemaker in place.   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   CT chest done at [facility] reviewed by me shows an irregular peripheral 1.9x1.2cm nodule PET scan 08/08/19 reviewed by me shows mild hypermetabolism of the nodule, measured at 1.4cm, a hypermetabolic subcarinal node, calcified R hilar node. No evidence distant disease  ACE level 07/15/19 >> 18 RA latex 2/15 >> negative  ROV 10/18/19 --follow-up visit for 65 year old woman with a history of arterial fibromuscular dysplasia, hypertension, mitral valve prolapse, sick sinus syndrome (with pacer), history of panuveitis.  I have followed her for a 1.9 x 1.2 cm nodule noted on PET scan 08/08/2019, mild hypermetabolism.  Also calcified right hilar node and no evidence of distant disease.  We had tentatively planned for bronchoscopy and biopsy but a repeat CT scan done 09/11/2019, reviewed by me shows some possible decrease in size in the 1.7 x 1.2 cm lesion.  She had stable mildly enlarged subcarinal right paratracheal lymph nodes.  Based on the findings we decided to defer her bronchoscopy and repeat her CT to look for interval change.  Her next scan should be in October. She is still following with optho for the uveitis - currently believed to be infectious.  No CP, no cough, no resp symptoms.   ROV 03/16/20 --65 year old woman with arterial fibromuscular dysplasia, hypertension, MVP, sick sinus syndrome with pacer, panuveitis.  We have been following a right peripheral lower lobe multilobulated nodule that measures 2.0 x 1.4 x 0.9 cm it has been overall stable in size.  It was a bit smaller  on a CT chest/14/21 and bronchoscopy was deferred based on that scan.  She now has had repeat scans done 12/16/2019 and 02/12/2020 that I have reviewed, showed that the nodular area persists but has not changed over time.   ROV 09/01/20 --this follow-up visit 65 year old woman with history of hypertension, mitral valve prolapse, sick sinus syndrome with a pacemaker.  Also panuveitis and an arterial fibromuscular dysplasia.  We have been following a multilobulated right lower lobe pulmonary nodule with serial CT scans of the chest.  She underwent a CT chest 08/11/2020 which I have reviewed, shows that the branching nodular opacity is now 1.8 about 1.3 cm, slightly smaller than her most recent scans, reassuring.  Review of Systems As per HPI      Objective:   Physical Exam Vitals:   09/01/20 0855  BP: 128/80  Pulse: 84  Temp: 98 F (36.7 C)  TempSrc: Temporal  SpO2: 95%  Weight: 199 lb 9.6 oz (90.5 kg)  Height: 5\' 4"  (1.626 m)   Gen: Pleasant, well-nourished, in no distress,  normal affect  ENT: No lesions,  mouth clear,  oropharynx clear, no postnasal drip  Neck: No JVD, no stridor  Lungs: No use of accessory muscles, no crackles or wheezing on normal respiration, no wheeze on forced expiration  Cardiovascular: RRR, heart sounds normal, no murmur or gallops, no peripheral edema  Musculoskeletal: No deformities, no cyanosis or clubbing  Neuro: alert, awake, non focal  Skin: Warm, no lesions or ras     Assessment & Plan:  Pulmonary nodule 1 cm or  greater in diameter The pulmonary nodule is still present, possibly slightly less prominent on most recent scan from March.  We have about 1 year of stability and her next scan will need to be in September.  I will follow up with her after to review.  We will plan to repeat your CT scan of the chest in September 2022. Please let us know if you have any trouble with breathing, cough, chest discomfort. Follow Dr. Delton Coombes in September after  your CT so that we can review the results together.  Levy Pupa, MD, PhD 09/01/2020, 9:14 AM [facility] [phone] or if no answer [phone]

## 2020-09-01 NOTE — Patient Instructions (Signed)
We will plan to repeat your CT scan of the chest in September 2022. Please let us know if you have any trouble with breathing, cough, chest discomfort. Follow Dr. Delton Coombes in September after your CT so that we can review the results together.

## 2020-09-09 ENCOUNTER — Ambulatory Visit (INDEPENDENT_AMBULATORY_CARE_PROVIDER_SITE_OTHER): Payer: Medicare Other

## 2020-09-09 DIAGNOSIS — I495 Sick sinus syndrome: Secondary | ICD-10-CM

## 2020-09-10 LAB — CUP PACEART REMOTE DEVICE CHECK
Battery Remaining Longevity: 144 mo
Battery Voltage: 3.03 V
Brady Statistic AP VP Percent: 0.04 %
Brady Statistic AP VS Percent: 94.15 %
Brady Statistic AS VP Percent: 0 %
Brady Statistic AS VS Percent: 5.81 %
Brady Statistic RA Percent Paced: 94.39 %
Brady Statistic RV Percent Paced: 0.04 %
Date Time Interrogation Session: 20220413050618
Implantable Lead Implant Date: 19970416
Implantable Lead Implant Date: 19970416
Implantable Lead Location: 753859
Implantable Lead Location: 753860
Implantable Lead Model: 5034
Implantable Lead Model: 5534
Implantable Pulse Generator Implant Date: 20200113
Lead Channel Impedance Value: 608 Ohm
Lead Channel Impedance Value: 646 Ohm
Lead Channel Impedance Value: 665 Ohm
Lead Channel Impedance Value: 741 Ohm
Lead Channel Pacing Threshold Amplitude: 0.75 V
Lead Channel Pacing Threshold Amplitude: 0.875 V
Lead Channel Pacing Threshold Pulse Width: 0.4 ms
Lead Channel Pacing Threshold Pulse Width: 0.4 ms
Lead Channel Sensing Intrinsic Amplitude: 1.75 mV
Lead Channel Sensing Intrinsic Amplitude: 1.75 mV
Lead Channel Sensing Intrinsic Amplitude: 17.25 mV
Lead Channel Sensing Intrinsic Amplitude: 17.25 mV
Lead Channel Setting Pacing Amplitude: 1.5 V
Lead Channel Setting Pacing Amplitude: 2.5 V
Lead Channel Setting Pacing Pulse Width: 0.4 ms
Lead Channel Setting Sensing Sensitivity: 2 mV

## 2020-09-24 NOTE — Progress Notes (Signed)
Remote pacemaker transmission.   

## 2020-09-25 DIAGNOSIS — I10 Essential (primary) hypertension: Secondary | ICD-10-CM | POA: Diagnosis not present

## 2020-10-12 ENCOUNTER — Telehealth: Payer: Self-pay | Admitting: Interventional Cardiology

## 2020-10-12 NOTE — Telephone Encounter (Signed)
Called pt back and female answered the phone.  Provided me with cell number to contact pt. Called cell and left message to call back.

## 2020-10-12 NOTE — Telephone Encounter (Signed)
Pt c/o swelling: STAT is pt has developed SOB within 24 hours  1) How much weight have you gained and in what time span? Has not weighed herself but does not think she has gained any weight  2) If swelling, where is the swelling located? Ankles, and left arm and hands  3) Are you currently taking a fluid pill? Thinks her BP med has lasix  4) Are you currently SOB? no  5) Do you have a log of your daily weights (if so, list)? no  6) Have you gained 3 pounds in a day or 5 pounds in a week? no  7) Have you traveled recently? No   Patient states she has swelling in her ankles and left arm and hands. She states it is mainly the ankles. She states she is not having any other symptoms. She states she thinks her one of her BP medications has lasix in it. She states if she does not answer her home number to call her cell phone.

## 2020-10-13 NOTE — Telephone Encounter (Signed)
Late entry:  Spoke with pt yesterday and she states she has been having swelling in ankles for about 2 weeks.  Left arm/hand swelling is more recent.  Swelling improves with elevation.  Denies SOB, CP, lightheadedness or dizziness.  Pt admits to eating out a lot more recently.  Been eating a lot of fish.  Some fried, some grilled.  Hasn't been drinking very much water.  Currently on Valsartan/HCTZ 320/25.  No vitals, including weights.  Advised pt to decrease salt intake, including not eating out as much and increase water intake.  Advised if no improvement of swelling in one week, please let us know.  Advised I will send message to Dr. Katrinka Blazing for review and would call back if he has any further recommendations.

## 2020-10-13 NOTE — Telephone Encounter (Signed)
We need her to track weight and vital signs. When she calls back, we need data.

## 2020-10-13 NOTE — Telephone Encounter (Signed)
Spoke with pt and made her aware to monitor her vitals and to call the office next week if no improvement in swelling.  Pt appreciative for call.

## 2020-10-20 ENCOUNTER — Telehealth: Payer: Self-pay | Admitting: Interventional Cardiology

## 2020-10-20 NOTE — Telephone Encounter (Signed)
Follow up:     Patient states she spoke with Victorino Dike B. Last week ans stated she was to call report weight.

## 2020-10-20 NOTE — Telephone Encounter (Signed)
Called patient back to obtain BP and weight log. Patient expressed that Victorino Dike B requested this information.  Monday 5/16 115/80 198.1 lbs Tuesday 5/17 135/88 198.2 lbs Wednesday 5/18 136/88 198.2 lbs Thursday 5/19 125/79 198.2 lbs Friday 5/20 123/80 199 lbs Saturday 5/21 121/79 199 lbs Sunday 5/22 123/83 199 lbs She reports that swelling to left ankle and left hand go down at night.  However, when she is up and moving around during the day both areas swell up. Will send readings to RN as requested.

## 2020-10-21 NOTE — Telephone Encounter (Signed)
Pt called in last week with issues with swelling.  We advised her to monitor weight and BP readings (listed below).  She had been eating out a lot and eating fish, both fried and grilled.  Still having swelling during the day that improves at night.

## 2020-10-23 NOTE — Telephone Encounter (Signed)
BP is good. Not sure swelling has anything to do with heart/BP. Especially unusual is that it only effects the left side. Should talk to PCP.  ----- Message -----  From: Julio Sicks, RN  Sent: 10/21/2020  7:52 AM EDT  To: Lyn Records, MD, Julio Sicks, RN     Spoke with pt and made her aware of information from Dr. Katrinka Blazing.  Pt appreciative for call.

## 2020-10-26 DIAGNOSIS — I10 Essential (primary) hypertension: Secondary | ICD-10-CM | POA: Diagnosis not present

## 2020-10-26 DIAGNOSIS — K219 Gastro-esophageal reflux disease without esophagitis: Secondary | ICD-10-CM | POA: Diagnosis not present

## 2020-10-26 DIAGNOSIS — E039 Hypothyroidism, unspecified: Secondary | ICD-10-CM | POA: Diagnosis not present

## 2020-10-27 DIAGNOSIS — I10 Essential (primary) hypertension: Secondary | ICD-10-CM | POA: Diagnosis not present

## 2020-10-29 DIAGNOSIS — M9903 Segmental and somatic dysfunction of lumbar region: Secondary | ICD-10-CM | POA: Diagnosis not present

## 2020-10-29 DIAGNOSIS — S335XXA Sprain of ligaments of lumbar spine, initial encounter: Secondary | ICD-10-CM | POA: Diagnosis not present

## 2020-10-30 ENCOUNTER — Encounter: Payer: Medicare Other | Admitting: Internal Medicine

## 2020-10-30 DIAGNOSIS — S335XXA Sprain of ligaments of lumbar spine, initial encounter: Secondary | ICD-10-CM | POA: Diagnosis not present

## 2020-10-30 DIAGNOSIS — M9903 Segmental and somatic dysfunction of lumbar region: Secondary | ICD-10-CM | POA: Diagnosis not present

## 2020-11-02 DIAGNOSIS — S335XXA Sprain of ligaments of lumbar spine, initial encounter: Secondary | ICD-10-CM | POA: Diagnosis not present

## 2020-11-02 DIAGNOSIS — M9903 Segmental and somatic dysfunction of lumbar region: Secondary | ICD-10-CM | POA: Diagnosis not present

## 2020-11-03 DIAGNOSIS — S335XXA Sprain of ligaments of lumbar spine, initial encounter: Secondary | ICD-10-CM | POA: Diagnosis not present

## 2020-11-03 DIAGNOSIS — M9903 Segmental and somatic dysfunction of lumbar region: Secondary | ICD-10-CM | POA: Diagnosis not present

## 2020-11-06 ENCOUNTER — Ambulatory Visit: Payer: Medicare Other | Admitting: Internal Medicine

## 2020-11-06 ENCOUNTER — Other Ambulatory Visit: Payer: Self-pay

## 2020-11-06 ENCOUNTER — Encounter: Payer: Self-pay | Admitting: Internal Medicine

## 2020-11-06 VITALS — BP 126/78 | HR 84 | Ht 64.0 in | Wt 195.8 lb

## 2020-11-06 DIAGNOSIS — S335XXA Sprain of ligaments of lumbar spine, initial encounter: Secondary | ICD-10-CM | POA: Diagnosis not present

## 2020-11-06 DIAGNOSIS — M9903 Segmental and somatic dysfunction of lumbar region: Secondary | ICD-10-CM | POA: Diagnosis not present

## 2020-11-06 DIAGNOSIS — I495 Sick sinus syndrome: Secondary | ICD-10-CM

## 2020-11-06 DIAGNOSIS — I10 Essential (primary) hypertension: Secondary | ICD-10-CM

## 2020-11-06 NOTE — Patient Instructions (Signed)
Medication Instructions:  Continue all current medications.  Labwork: none  Testing/Procedures: none  Follow-Up: 1 year   Any Other Special Instructions Will Be Listed Below (If Applicable).  If you need a refill on your cardiac medications before your next appointment, please call your pharmacy.  

## 2020-11-06 NOTE — Progress Notes (Signed)
PCP: Ignatius Specking, MD Primary Cardiologist: Dr Katrinka Blazing Primary EP:  Dr Johney Frame  Sylvia Anthony is a 65 y.o. female who presents today for routine electrophysiology followup.  Since last being seen in our clinic, the patient reports doing very well.  Today, she denies symptoms of palpitations, chest pain, shortness of breath,  lower extremity edema, dizziness, presyncope, or syncope.  The patient is otherwise without complaint today.   Past Medical History:  Diagnosis Date   Anemia    Arm numbness    Arterial fibromuscular dysplasia (HCC)    Carotid bruit    Cataract    Mixed form OU   CHF (congestive heart failure) (HCC) 1997   pacemaker   Fibromyalgia    GERD (gastroesophageal reflux disease)    Glucose intolerance (impaired glucose tolerance)    H. pylori infection Sept 2013   Amoxicillin, Biaxin   Hyperlipidemia    Hypertension    Hypertensive retinopathy    OU   Hypothyroidism    Mitral valve prolapse    Peripheral vascular disease (HCC)    Rheumatic fever    age 6, was at [facility] for 17 weeks   SSS (sick sinus syndrome) [facility])    Past Surgical History:  Procedure Laterality Date   ABDOMINAL HYSTERECTOMY     partial   APPENDECTOMY     CARDIAC CATHETERIZATION     multiple   CHOLECYSTECTOMY     COLONOSCOPY  08/01/2006   3 mm descending colon polyp removed/8 mm sessile ascending colon polyp removed / 3-mm rectal  polyp removed /Rare sigmoid diverticulosis/ Moderate internal hemorrhoids.Advanced adenoma on colonoscopy in March 2008.  The polyp was anadenomatous polyp with a foci of high-grade dysplasia   COLONOSCOPY  09/08/2008   SIMPLE ADENOMA/HYPERPLASTIC POLY/Multiple colon polyps (ascending, sigmoid, rectal)  Mild sigmoid colon diverticulosis./ Small internal hemorrhoids   COLONOSCOPY N/A 12/11/2017   Dr. [name]: diverticulosis, hemorrhoids next surveillance tcs 5 years.    COLONOSCOPY WITH ESOPHAGOGASTRODUODENOSCOPY (EGD)  Sept. 30, 2013   WJX:BJYN  diverticulosis was noted in the sigmoid colon/The colon was otherwise normal/Small internal hemorrhoids/EGD:The mucosa of the esophagus appeared normal/Non-erosive gastritis (inflammation) was found; multiple bx/The duodenal mucosa showed no abnormalities. +H.pylori gastritis, treated with equivalent of prevpac. SAVARY DILATION   ESOPHAGEAL DILATION N/A 10/07/2014   Procedure: ESOPHAGEAL DILATION;  Surgeon: West Bali, MD;  [facility];  Service: Endoscopy;  Laterality: N/A;   ESOPHAGOGASTRODUODENOSCOPY N/A 10/07/2014   Dr. [name]: moderate non-erosive gastritis, no definite stricture, empiric dilation . negative H.pylori    growth removed from intestine     [facility]-as teenager, done through colonoscopy   PACEMAKER GENERATOR CHANGE  09/18/2006   generator change by Dr Amil Amen with a MDT Adapta L   PACEMAKER INSERTION  09/13/1995   for sick sinus syndome and syncope at [facility]   PPM GENERATOR CHANGEOUT N/A 06/11/2018    Medtronic Azure XT DR MRI SureScan model [id number] (serial number [id number]) pacemaker by Dr Johney Frame   right Achilles tendon     X 2   TONSILLECTOMY      ROS- all systems are reviewed and negative except as per HPI above  Current Outpatient Medications  Medication Sig Dispense Refill   acetaminophen (TYLENOL) 500 MG tablet Take 500 mg by mouth every 6 (six) hours as needed.     albuterol (PROVENTIL HFA;VENTOLIN HFA) 108 (90 BASE) MCG/ACT inhaler Inhale 2 puffs into the lungs every 6 (six) hours as needed for  wheezing or shortness of breath.     aspirin EC 81 MG tablet Take 1 tablet (81 mg total) by mouth daily. 90 tablet 3   brimonidine (ALPHAGAN) 0.2 % ophthalmic solution Place 1 drop into both eyes 3 (three) times daily. 15 mL 3   cetirizine (ZYRTEC) 10 MG tablet Take 10 mg by mouth at bedtime as needed for allergies.      diclofenac (VOLTAREN) 75 MG EC tablet Take 75 mg by mouth daily as needed for mild pain.     dorzolamide-timolol (COSOPT) 22.3-6.8  MG/ML ophthalmic solution Place 1 drop into both eyes 3 (three) times daily. 10 mL 3   levothyroxine (SYNTHROID, LEVOTHROID) 112 MCG tablet Take 112 mcg by mouth daily before breakfast.      meclizine (ANTIVERT) 25 MG tablet Take 1 tablet (25 mg total) by mouth 3 (three) times daily as needed for dizziness. 21 tablet 0   metoprolol succinate (TOPROL-XL) 50 MG 24 hr tablet Take 50 mg by mouth daily.     nitroGLYCERIN (NITROSTAT) 0.4 MG SL tablet Place 1 tablet (0.4 mg total) under the tongue every 5 (five) minutes as needed for chest pain. 25 tablet 3   ondansetron (ZOFRAN ODT) 4 MG disintegrating tablet 4mg  ODT q4 hours prn nausea/vomit 12 tablet 0   pantoprazole (PROTONIX) 40 MG tablet Take 1 tablet (40 mg total) by mouth 2 (two) times daily before a meal. 60 tablet 5   potassium chloride SA (K-DUR,KLOR-CON) 20 MEQ tablet Take 20 mEq by mouth daily.     rosuvastatin (CRESTOR) 20 MG tablet Take 20 mg by mouth daily.  6   valsartan-hydrochlorothiazide (DIOVAN-HCT) 320-25 MG per tablet Take 1 tablet by mouth daily.     No current facility-administered medications for this visit.    Physical Exam: Vitals:   11/06/20 0905  BP: 126/78  Pulse: 84  SpO2: 99%  Weight: 195 lb 12.8 oz (88.8 kg)  Height: 5\' 4"  (1.626 m)    GEN- The patient is well appearing, alert and oriented x 3 today.   Head- normocephalic, atraumatic Eyes-  Sclera clear, conjunctiva pink Ears- hearing intact Oropharynx- clear Lungs-   normal work of breathing Chest- pacemaker pocket is well healed Heart- Regular rate and rhythm  GI- soft,  Extremities- no clubbing, cyanosis, or edema  Pacemaker interrogation- reviewed in detail today,  See PACEART report    Assessment and Plan:  1. Symptomatic sinus bradycardia   Normal pacemaker function See Pace Art report No changes today she is not device dependant today  2. HTN Stable No change required today  3. Overweight Lifestyle modification is advised  Risks,  benefits and potential toxicities for medications prescribed and/or refilled reviewed with patient today.   01/06/21 MD, [facility] 11/06/2020 9:19 AM

## 2020-11-09 DIAGNOSIS — M9903 Segmental and somatic dysfunction of lumbar region: Secondary | ICD-10-CM | POA: Diagnosis not present

## 2020-11-09 DIAGNOSIS — S335XXA Sprain of ligaments of lumbar spine, initial encounter: Secondary | ICD-10-CM | POA: Diagnosis not present

## 2020-11-12 DIAGNOSIS — S335XXA Sprain of ligaments of lumbar spine, initial encounter: Secondary | ICD-10-CM | POA: Diagnosis not present

## 2020-11-12 DIAGNOSIS — M9903 Segmental and somatic dysfunction of lumbar region: Secondary | ICD-10-CM | POA: Diagnosis not present

## 2020-11-16 DIAGNOSIS — S335XXA Sprain of ligaments of lumbar spine, initial encounter: Secondary | ICD-10-CM | POA: Diagnosis not present

## 2020-11-16 DIAGNOSIS — M9903 Segmental and somatic dysfunction of lumbar region: Secondary | ICD-10-CM | POA: Diagnosis not present

## 2020-11-17 ENCOUNTER — Other Ambulatory Visit: Payer: Self-pay

## 2020-11-17 ENCOUNTER — Ambulatory Visit: Payer: Medicare Other | Admitting: Family

## 2020-11-17 ENCOUNTER — Encounter: Payer: Self-pay | Admitting: Family

## 2020-11-17 VITALS — BP 130/80 | HR 74 | Ht 64.0 in | Wt 197.0 lb

## 2020-11-17 DIAGNOSIS — R0609 Other forms of dyspnea: Secondary | ICD-10-CM

## 2020-11-17 DIAGNOSIS — Z950 Presence of cardiac pacemaker: Secondary | ICD-10-CM

## 2020-11-17 DIAGNOSIS — R600 Localized edema: Secondary | ICD-10-CM

## 2020-11-17 DIAGNOSIS — I10 Essential (primary) hypertension: Secondary | ICD-10-CM | POA: Diagnosis not present

## 2020-11-17 DIAGNOSIS — R0600 Dyspnea, unspecified: Secondary | ICD-10-CM

## 2020-11-17 MED ORDER — AMLODIPINE BESYLATE 5 MG PO TABS: 5.0000 mg | ORAL_TABLET | Freq: Every day | ORAL | 1 refills | Status: DC

## 2020-11-17 NOTE — Progress Notes (Signed)
Office Visit    Patient Name: Sylvia Anthony Date of Encounter: 11/17/2020  PCP:  Ignatius Specking, MD   [facility]  Cardiologist:  Lesleigh Noe, MD  Advanced Practice Provider:  No care team member to display Electrophysiologist:  Hillis Range, MD   Chief Complaint    Sylvia Anthony is a 65 y.o. 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 pylori infection Sept 2013   Amoxicillin, Biaxin   Hyperlipidemia    Hypertension    Hypertensive retinopathy    OU   Hypothyroidism    Mitral valve prolapse    Peripheral vascular disease (HCC)    Rheumatic fever    age 39, was at [facility] for [facility] weeks   SSS (sick sinus syndrome) [facility])    Past Surgical History:  Procedure Laterality Date   ABDOMINAL HYSTERECTOMY     partial   APPENDECTOMY     CARDIAC CATHETERIZATION     multiple   CHOLECYSTECTOMY     COLONOSCOPY  08/01/2006   3 mm descending colon polyp removed/8 mm sessile ascending colon polyp removed / 3-mm rectal  polyp removed /Rare sigmoid diverticulosis/ Moderate internal hemorrhoids.Advanced adenoma on colonoscopy in March 2008.  The polyp was anadenomatous polyp with a foci of high-grade dysplasia   COLONOSCOPY  09/08/2008   SIMPLE ADENOMA/HYPERPLASTIC POLY/Multiple colon polyps (ascending, sigmoid, rectal)  Mild sigmoid colon diverticulosis./ Small internal hemorrhoids   COLONOSCOPY N/A 12/11/2017   Dr. [name]: diverticulosis, hemorrhoids next surveillance tcs 5 years.    COLONOSCOPY  WITH ESOPHAGOGASTRODUODENOSCOPY (EGD)  Sept. 30, 2013   FKC:LEXN diverticulosis was noted in the sigmoid colon/The colon was otherwise normal/Small internal hemorrhoids/EGD:The mucosa of the esophagus appeared normal/Non-erosive gastritis (inflammation) was found; multiple bx/The duodenal mucosa showed no abnormalities. +H.pylori gastritis, treated with equivalent of prevpac. SAVARY DILATION   ESOPHAGEAL DILATION N/A 10/07/2014   Procedure: ESOPHAGEAL DILATION;  Surgeon: West Bali, MD;  [facility];  Service: Endoscopy;  Laterality: N/A;   ESOPHAGOGASTRODUODENOSCOPY N/A 10/07/2014   Dr. [name]: moderate non-erosive gastritis, no definite stricture, empiric dilation . negative H.pylori    growth removed from intestine     [facility]-as teenager, done through colonoscopy   PACEMAKER GENERATOR CHANGE  09/18/2006   generator change by Dr Amil Amen with a MDT Adapta L   PACEMAKER INSERTION  09/13/1995   for sick sinus syndome and syncope at [facility] GENERATOR CHANGEOUT N/A 06/11/2018    Medtronic Azure XT DR MRI SureScan model [id number] (serial number [id number]) pacemaker by Dr Johney Frame   right Achilles tendon     X 2   TONSILLECTOMY      Allergies  Allergies  Allergen Reactions   Gabapentin     SWELLING "OUT OF IT" 12/23/16    History of Present Illness    Sylvia Anthony is a 65 y.o. female with a hx of sick sinus syndrome s/p PPM,  hypertension, CVA today, chronic dyspnea, hypertension, obesity last seen 11/06/2020 by Dr. Johney Frame.Marland Kitchen  Her PPM was placed initially in 1997 at [facility] for sick sinus syndrome and syncope.  She had a generator change in 2008 and again January 2020.  She was seen 06/22/2020 by Dr. Katrinka Blazing noting dyspnea on exertion which was stable at her baseline and caused by 17 stairs at her home.  It was noted that her EKG showed precordial T wave abnormality which was thought to represent post pacing repolarization.  No ischemic evaluation  was recommended, blood pressure was well controlled and she was arranged to follow-up with the EP.  Her dyspnea was thought to be related to deconditioning.  She saw Dr. Johney Frame 6-02/2021 her device was functioning appropriately.   She presents today for follow regarding edema. Most recent echocardiogram April 2016 LVEF 60 to 65%, no R WMA, normal diastolic parameters, no significant valvular abnormalities. Notes lower extremity edema in her ankles for a few weeks. The left seems to swell worse than the right. Tells me the swelling is worse in the evening and better in the morning. 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 She has tried compression stockings which help some but swelling comes right back. She drinks one bottle of water per day and one bottle of juice per day. Follows low salt diet. She reports when she walks her 16 steps at home notes dyspnea on exertion which is about the same. She does take Amlodipine 10mg  per day which she has done for some time.   EKGs/Labs/Other Studies Reviewed:   The following studies were reviewed today:  EKG:  No EKG today.  Recent Labs: 05/17/2020: BUN 9; Creatinine, Ser 0.70; Hemoglobin 15.0; Platelets 249; Potassium 3.5; Sodium 140  Recent Lipid Panel No results found for: CHOL, TRIG, HDL, CHOLHDL, VLDL, LDLCALC, LDLDIRECT   Home Medications   Current Meds  Medication Sig   acetaminophen (TYLENOL) 500 MG tablet Take 500 mg by mouth every 6 (six) hours as needed.   albuterol (PROVENTIL HFA;VENTOLIN HFA) 108 (90 BASE) MCG/ACT inhaler Inhale 2 puffs into the lungs every 6 (six) hours as needed for wheezing or shortness of breath.   amLODipine (NORVASC) 5 MG tablet Take 1 tablet (5 mg total) by mouth daily.   aspirin EC 81 MG tablet Take 1 tablet (81 mg total) by mouth daily.   cetirizine (ZYRTEC) 10 MG tablet Take 10 mg by mouth at bedtime as needed for allergies.    diclofenac (VOLTAREN) 75 MG EC tablet Take 75 mg by mouth daily as needed for  mild pain.   levothyroxine (SYNTHROID, LEVOTHROID) 112 MCG tablet Take 112 mcg by mouth daily before breakfast.    meclizine (ANTIVERT) 25 MG tablet Take 1 tablet (25 mg total) by mouth 3 (three) times daily as needed for dizziness.   metoprolol succinate (TOPROL-XL) 50 MG 24 hr tablet Take 50 mg by mouth daily.   nitroGLYCERIN (NITROSTAT) 0.4 MG SL tablet Place 1 tablet (0.4 mg total) under the tongue every 5 (five) minutes as needed for chest pain.   pantoprazole (PROTONIX) 40 MG tablet Take 1 tablet (40 mg total) by mouth 2 (two) times daily before a meal.   potassium chloride SA (K-DUR,KLOR-CON) 20 MEQ tablet Take 20 mEq by mouth daily.   rosuvastatin (CRESTOR) 20 MG tablet Take 20 mg by mouth daily.   valsartan-hydrochlorothiazide (DIOVAN-HCT) 320-25 MG per tablet Take 1 tablet by mouth daily.   [DISCONTINUED] amLODipine (NORVASC) 10 MG  tablet Take 10 mg by mouth daily.     Review of Systems   All other systems reviewed and are otherwise negative except as noted above.  Physical Exam    VS:  BP 130/80 (BP Location: Left Arm, Patient Position: Sitting, Cuff Size: Normal)   Pulse 74   Ht 5\' 4"  (1.626 m)   Wt 197 lb (89.4 kg)   SpO2 97%   BMI 33.81 kg/m  , BMI Body mass index is 33.81 kg/m.  Wt Readings from Last 3 Encounters:  11/17/20 197 lb (89.4 kg)  11/06/20 195 lb 12.8 oz (88.8 kg)  09/01/20 199 lb 9.6 oz (90.5 kg)     GEN: Well nourished, overweight, well developed, in no acute distress. HEENT: normal. Neck: Supple, no JVD, carotid bruits, or masses. Cardiac: RRR, no murmurs, rubs, or gallops. No clubbing, cyanosis.  Trace bilateral pretibial edema.  Radials/DP/PT 2+ and equal bilaterally.  Respiratory:  Respirations regular and unlabored, clear to auscultation bilaterally. GI: Soft, nontender, nondistended. MS: No deformity or atrophy. Skin: Warm and dry, no rash.  Varicose veins present on exam. Neuro:  Strength and sensation are intact. Psych: Normal  affect.  Assessment & Plan    Lower extremity edema -onset 2 weeks ago. Evening worse than morning .  Consider etiology high-dose amlodipine vs venous insufficiency vs diastolic dysfunction. Plan for echo to rule out heart failure, valvular dysfunction. Reduce Amlodipine to 5mg  daily  as could be contributory to lower extremity edema.  Continue valsartan-HCTZ 320-25 mg daily.  Encouraged compression stockings, lower extremity elevation, continue low-salt diet.  Sick sinus syndrome s/p PPM-continue to follow with EP.   HTN- BP well controlled.  High-dose amlodipine may be contributory to lower extremity edema.  Reduce amlodipine to 5 mg daily.  DOE-likely etiology deconditioning.  Dyspnea on exertion is stable compared to previous.  Update echocardiogram, as above.   Disposition: Follow up in 2 month(s) with Dr. 11/01/20 or APP  Signed, , NP 11/17/2020, 10:38 AM [facility]

## 2020-11-17 NOTE — Patient Instructions (Signed)
Medication Instructions:  Your physician has recommended you make the following change in your medication:  CHANGE Amlodipine to 5mg  daily   *If you need a refill on your cardiac medications before your next appointment, please call your pharmacy*   Lab Work: None ordered today.  If you have labs (blood work) drawn today and your tests are completely normal, you will receive your results only by: MyChart Message (if you have MyChart) OR A paper copy in the mail If you have any lab test that is abnormal or we need to change your treatment, we will call you to review the results.   Testing/Procedures: Your physician has requested that you have an echocardiogram. Echocardiography is a painless test that uses sound waves to create images of your heart. It provides your doctor with information about the size and shape of your heart and how well your heart's chambers and valves are working. This procedure takes approximately one hour. There are no restrictions for this procedure.   Follow-Up: At [facility], you and your health needs are our priority.  As part of our continuing mission to provide you with exceptional heart care, we have created designated Provider Care Teams.  These Care Teams include your primary Cardiologist (physician) and Advanced Practice Providers (APPs -  Physician Assistants and Nurse Practitioners) who all work together to provide you with the care you need, when you need it.  We recommend signing up for the patient portal called "MyChart".  Sign up information is provided on this After Visit Summary.  MyChart is used to connect with patients for Virtual Visits (Telemedicine).  Patients are able to view lab/test results, encounter notes, upcoming appointments, etc.  Non-urgent messages can be sent to your provider as well.   To learn more about what you can do with MyChart, go to [facility].    Your next appointment:   2 month(s)01/05/2021 ARRIVE AT 9:00    The format for your next appointment:   In Person  Provider:   You may see 03/07/2021, MD or one of the following Advanced Practice Providers on your designated Care Team:   Lesleigh Noe, Ronie Spies   Other Instructions   To prevent or reduce lower extremity swelling: Eat a low salt diet. Salt makes the body hold onto extra fluid which causes swelling. Sit with legs elevated. For example, in the recliner or on an ottoman.  Wear knee-high compression stockings during the daytime. Ones labeled 15-20 mmHg provide good compression.

## 2020-11-19 DIAGNOSIS — M9903 Segmental and somatic dysfunction of lumbar region: Secondary | ICD-10-CM | POA: Diagnosis not present

## 2020-11-19 DIAGNOSIS — S335XXA Sprain of ligaments of lumbar spine, initial encounter: Secondary | ICD-10-CM | POA: Diagnosis not present

## 2020-11-23 DIAGNOSIS — M9903 Segmental and somatic dysfunction of lumbar region: Secondary | ICD-10-CM | POA: Diagnosis not present

## 2020-11-23 DIAGNOSIS — S335XXA Sprain of ligaments of lumbar spine, initial encounter: Secondary | ICD-10-CM | POA: Diagnosis not present

## 2020-11-24 ENCOUNTER — Encounter: Payer: Self-pay | Admitting: Internal Medicine

## 2020-11-24 DIAGNOSIS — Z299 Encounter for prophylactic measures, unspecified: Secondary | ICD-10-CM | POA: Diagnosis not present

## 2020-11-24 DIAGNOSIS — I10 Essential (primary) hypertension: Secondary | ICD-10-CM | POA: Diagnosis not present

## 2020-11-24 DIAGNOSIS — I495 Sick sinus syndrome: Secondary | ICD-10-CM | POA: Diagnosis not present

## 2020-11-24 DIAGNOSIS — Z79899 Other long term (current) drug therapy: Secondary | ICD-10-CM | POA: Diagnosis not present

## 2020-11-24 DIAGNOSIS — E7800 Pure hypercholesterolemia, unspecified: Secondary | ICD-10-CM | POA: Diagnosis not present

## 2020-11-26 DIAGNOSIS — I10 Essential (primary) hypertension: Secondary | ICD-10-CM | POA: Diagnosis not present

## 2020-12-01 DIAGNOSIS — M9903 Segmental and somatic dysfunction of lumbar region: Secondary | ICD-10-CM | POA: Diagnosis not present

## 2020-12-01 DIAGNOSIS — S335XXA Sprain of ligaments of lumbar spine, initial encounter: Secondary | ICD-10-CM | POA: Diagnosis not present

## 2020-12-07 DIAGNOSIS — M9903 Segmental and somatic dysfunction of lumbar region: Secondary | ICD-10-CM | POA: Diagnosis not present

## 2020-12-07 DIAGNOSIS — S335XXA Sprain of ligaments of lumbar spine, initial encounter: Secondary | ICD-10-CM | POA: Diagnosis not present

## 2020-12-09 ENCOUNTER — Ambulatory Visit (INDEPENDENT_AMBULATORY_CARE_PROVIDER_SITE_OTHER): Payer: Medicare Other

## 2020-12-09 DIAGNOSIS — I495 Sick sinus syndrome: Secondary | ICD-10-CM

## 2020-12-09 LAB — CUP PACEART REMOTE DEVICE CHECK
Battery Remaining Longevity: 142 mo
Battery Voltage: 3.03 V
Brady Statistic AP VP Percent: 0.04 %
Brady Statistic AP VS Percent: 86.28 %
Brady Statistic AS VP Percent: 0 %
Brady Statistic AS VS Percent: 13.67 %
Brady Statistic RA Percent Paced: 86.54 %
Brady Statistic RV Percent Paced: 0.04 %
Date Time Interrogation Session: 20220712211709
Implantable Lead Implant Date: 19970416
Implantable Lead Implant Date: 19970416
Implantable Lead Location: 753859
Implantable Lead Location: 753860
Implantable Lead Model: 5034
Implantable Lead Model: 5534
Implantable Pulse Generator Implant Date: 20200113
Lead Channel Impedance Value: 646 Ohm
Lead Channel Impedance Value: 665 Ohm
Lead Channel Impedance Value: 703 Ohm
Lead Channel Impedance Value: 722 Ohm
Lead Channel Pacing Threshold Amplitude: 0.625 V
Lead Channel Pacing Threshold Amplitude: 0.875 V
Lead Channel Pacing Threshold Pulse Width: 0.4 ms
Lead Channel Pacing Threshold Pulse Width: 0.4 ms
Lead Channel Sensing Intrinsic Amplitude: 1.625 mV
Lead Channel Sensing Intrinsic Amplitude: 1.625 mV
Lead Channel Sensing Intrinsic Amplitude: 18.375 mV
Lead Channel Sensing Intrinsic Amplitude: 18.375 mV
Lead Channel Setting Pacing Amplitude: 1.5 V
Lead Channel Setting Pacing Amplitude: 2.5 V
Lead Channel Setting Pacing Pulse Width: 0.4 ms
Lead Channel Setting Sensing Sensitivity: 2 mV

## 2020-12-14 ENCOUNTER — Ambulatory Visit (HOSPITAL_COMMUNITY): Payer: Medicare Other | Attending: Family

## 2020-12-14 ENCOUNTER — Other Ambulatory Visit: Payer: Self-pay

## 2020-12-14 DIAGNOSIS — R0600 Dyspnea, unspecified: Secondary | ICD-10-CM | POA: Insufficient documentation

## 2020-12-14 DIAGNOSIS — I10 Essential (primary) hypertension: Secondary | ICD-10-CM | POA: Insufficient documentation

## 2020-12-14 DIAGNOSIS — R600 Localized edema: Secondary | ICD-10-CM | POA: Diagnosis not present

## 2020-12-14 DIAGNOSIS — R0609 Other forms of dyspnea: Secondary | ICD-10-CM

## 2020-12-14 LAB — ECHOCARDIOGRAM COMPLETE
Area-P 1/2: 3.27 cm2
MV VTI: 2.1 cm2
S' Lateral: 2.4 cm

## 2020-12-15 DIAGNOSIS — M9903 Segmental and somatic dysfunction of lumbar region: Secondary | ICD-10-CM | POA: Diagnosis not present

## 2020-12-15 DIAGNOSIS — S335XXA Sprain of ligaments of lumbar spine, initial encounter: Secondary | ICD-10-CM | POA: Diagnosis not present

## 2020-12-21 DIAGNOSIS — I700 Atherosclerosis of aorta: Secondary | ICD-10-CM | POA: Diagnosis not present

## 2020-12-21 DIAGNOSIS — J40 Bronchitis, not specified as acute or chronic: Secondary | ICD-10-CM | POA: Diagnosis not present

## 2020-12-21 DIAGNOSIS — Z299 Encounter for prophylactic measures, unspecified: Secondary | ICD-10-CM | POA: Diagnosis not present

## 2020-12-21 DIAGNOSIS — I10 Essential (primary) hypertension: Secondary | ICD-10-CM | POA: Diagnosis not present

## 2020-12-25 DIAGNOSIS — I10 Essential (primary) hypertension: Secondary | ICD-10-CM | POA: Diagnosis not present

## 2020-12-28 DIAGNOSIS — S335XXA Sprain of ligaments of lumbar spine, initial encounter: Secondary | ICD-10-CM | POA: Diagnosis not present

## 2020-12-28 DIAGNOSIS — M9903 Segmental and somatic dysfunction of lumbar region: Secondary | ICD-10-CM | POA: Diagnosis not present

## 2021-01-01 NOTE — Progress Notes (Signed)
Remote pacemaker transmission.   

## 2021-01-03 ENCOUNTER — Encounter: Payer: Self-pay | Admitting: Physician Assistant

## 2021-01-03 NOTE — Progress Notes (Addendum)
Cardiology Office Note    Date:  01/05/2021   ID:  Sylvia Anthony, DOB 12-14-1955, MRN [id number]  PCP:  Ignatius Specking, MD  Cardiologist:  Lesleigh Noe, MD  Electrophysiologist:  Hillis Range, MD   Chief Complaint: f/u edema  History of Present Illness:   Sylvia Anthony is a 65 y.o. female with history of sick sinus syndrome s/p PPM (implanted 1997 with last MDT gen change 05/2018), hypertension, chronic dyspnea, obesity, anemia, fibromyalgia, remote MVP (not seen on recent echoes), rheumatic fever age 64, ?PVD, hypothyroidism, pulmonary nodule (followed by Dr. Delton Coombes) who presents for follow-up. Remote cardiac cath 2002 showed normal coronaries (done for abnormal stress test) - has had a chronically abnormal EKG. PVD and arterial fibromuscular dysplasia are listed in PMH but no clear details, were entered by CMA in 2012. The patient denies ever having these diagnoses and carotid duplex 2014 grossly normal. Stroke was also previously listed in notes but she denies this and prior head CTs on file did not show this. She was recently seen in the office 10/2020 for increasing LE edema. 2D echo was ordered showing EF 65-70%, grade 1 DD, normal RV, trivial MR, no acute changes. It was felt amlodipine may be the culprit so dose was reduced.  She is seen back in follow-up doing well. Her edema has significant improved on the lower dose of amlodipine. She denies any recent dyspnea, chest pain, orthopnea, PND, palpitations or dizziness. She has been noticed when checking her BPs it is running 130s or above at times.  Labwork independently reviewed: 04/2020 Hgb 15, BMET wnl K 3.5, no recent lipids on file - pt reports PCP checks   Past Medical History:  Diagnosis Date   Anemia    Arm numbness    Cataract    Mixed form OU   Fibromyalgia    GERD (gastroesophageal reflux disease)    Glucose intolerance (impaired glucose tolerance)    H. pylori infection 01/2012   Amoxicillin, Biaxin    Hyperlipidemia    Hypertension    Hypertensive retinopathy    OU   Hypothyroidism    Mitral valve prolapse    not seen on recent echoes   Rheumatic fever    age 5, was at [facility] for [facility] weeks   SSS (sick sinus syndrome) (HCC)    1997 ppm, gen change 2020 (MDT)    Past Surgical History:  Procedure Laterality Date   ABDOMINAL HYSTERECTOMY     partial   APPENDECTOMY     CARDIAC CATHETERIZATION     multiple   CHOLECYSTECTOMY     COLONOSCOPY  08/01/2006   3 mm descending colon polyp removed/8 mm sessile ascending colon polyp removed / 3-mm rectal  polyp removed /Rare sigmoid diverticulosis/ Moderate internal hemorrhoids.Advanced adenoma on colonoscopy in March 2008.  The polyp was anadenomatous polyp with a foci of high-grade dysplasia   COLONOSCOPY  09/08/2008   SIMPLE ADENOMA/HYPERPLASTIC POLY/Multiple colon polyps (ascending, sigmoid, rectal)  Mild sigmoid colon diverticulosis./ Small internal hemorrhoids   COLONOSCOPY N/A 12/11/2017   Dr. [name]: diverticulosis, hemorrhoids next surveillance tcs 5 years.    COLONOSCOPY WITH ESOPHAGOGASTRODUODENOSCOPY (EGD)  Sept. 30, 2013   WVP:XTGG diverticulosis was noted in the sigmoid colon/The colon was otherwise normal/Small internal hemorrhoids/EGD:The mucosa of the esophagus appeared normal/Non-erosive gastritis (inflammation) was found; multiple bx/The duodenal mucosa showed no abnormalities. +H.pylori gastritis, treated with equivalent of prevpac. SAVARY DILATION   ESOPHAGEAL DILATION N/A 10/07/2014   Procedure:  ESOPHAGEAL DILATION;  Surgeon: West Bali, MD;  Location: [facility];  Service: Endoscopy;  Laterality: N/A;   ESOPHAGOGASTRODUODENOSCOPY N/A 10/07/2014   Dr. [name]: moderate non-erosive gastritis, no definite stricture, empiric dilation . negative H.pylori    growth removed from intestine     [facility]-as teenager, done through colonoscopy   PACEMAKER GENERATOR CHANGE  09/18/2006   generator change by Dr Amil Amen with a MDT  Adapta L   PACEMAKER INSERTION  09/13/1995   for sick sinus syndome and syncope at [facility] GENERATOR CHANGEOUT N/A 06/11/2018    Med[employer]onic Azure XT DR MRI SureScan model [id number] (serial number [id number]) pacemaker by Dr Johney Frame   right Achilles tendon     X 2   TONSILLECTOMY      Current Medications: Current Meds  Medication Sig   acetaminophen (TYLENOL) 500 MG tablet Take 500 mg by mouth every 6 (six) hours as needed.   albuterol (PROVENTIL HFA;VENTOLIN HFA) 108 (90 BASE) MCG/ACT inhaler Inhale 2 puffs into the lungs every 6 (six) hours as needed for wheezing or shortness of breath.   amLODipine (NORVASC) 5 MG tablet Take 1 tablet (5 mg total) by mouth daily.   aspirin EC 81 MG tablet Take 1 tablet (81 mg total) by mouth daily.   carvedilol (COREG) 6.25 MG tablet Take 1 tablet (6.25 mg total) by mouth 2 (two) times daily.   cetirizine (ZYRTEC) 10 MG tablet Take 10 mg by mouth at bedtime as needed for allergies.    diclofenac (VOLTAREN) 75 MG EC tablet Take 75 mg by mouth daily as needed for mild pain.   levothyroxine (SYNTHROID, LEVOTHROID) 112 MCG tablet Take 112 mcg by mouth daily before breakfast.    meclizine (ANTIVERT) 25 MG tablet Take 1 tablet (25 mg total) by mouth 3 (three) times daily as needed for dizziness.   nitroGLYCERIN (NITROSTAT) 0.4 MG SL tablet Place 1 tablet (0.4 mg total) under the tongue every 5 (five) minutes as needed for chest pain.   pantoprazole (PROTONIX) 40 MG tablet Take 1 tablet (40 mg total) by mouth 2 (two) times daily before a meal.   potassium chloride SA (K-DUR,KLOR-CON) 20 MEQ tablet Take 20 mEq by mouth daily.   rosuvastatin (CRESTOR) 20 MG tablet Take 20 mg by mouth daily.   valsartan-hydrochlorothiazide (DIOVAN-HCT) 320-25 MG per tablet Take 1 tablet by mouth daily.   metoprolol succinate (TOPROL-XL) 50 MG 24 hr tablet Take 50 mg by mouth daily.      Allergies:   Gabapentin   Social History   Socioeconomic History    Marital status: Married    Spouse name: Not on file   Number of children: 1   Years of education: Not on file   Highest education level: Not on file  Occupational History   Occupation: part-time hairdresser  Tobacco Use   Smoking status: Former    Packs/day: 0.25    Years: 4.00    Pack years: 1.00    Types: Cigarettes    Quit date: 10/09/1985    Years since quitting: 35.2   Smokeless tobacco: Never   Tobacco comments:    just in teens  Vaping Use   Vaping Use: Never used  Substance and Sexual Activity   Alcohol use: No    Alcohol/week: 0.0 standard drinks   Drug use: No   Sexual activity: Not on file  Other Topics Concern   Not on file  Social History Narrative   Lives in [place]  with spouse.  Son is healthy at age 67.   Disabled         Social Determinants of Health   Financial Resource Strain: Not on file  Food Insecurity: Not on file  Transportation Needs: Not on file  Physical Activity: Not on file  Stress: Not on file  Social Connections: Not on file     Family History:  The patient's family history includes Colon polyps in her brother and sister; Heart disease in her father; Hyperlipidemia in her brother, mother, and sister; Hypertension in her brother, father, mother, and sister. There is no history of Colon cancer.  ROS:   Please see the history of present illness. All other systems are reviewed and otherwise negative.    EKGs/Labs/Other Studies Reviewed:    Studies reviewed are outlined and summarized above. Reports included below if pertinent.  2D Echo 11/2020   1. Left ventricular ejection fraction, by estimation, is 65 to 70%. Left  ventricular ejection fraction by 3D volume is 70 %. The left ventricle has  normal function. The left ventricle has no regional wall motion  abnormalities. There is mild concentric  left ventricular hypertrophy. Left ventricular diastolic parameters are  consistent with Grade I diastolic dysfunction (impaired  relaxation). The  average left ventricular global longitudinal strain is -23.7 %. The global  longitudinal strain is normal.   2. Right ventricular systolic function is normal. The right ventricular  size is normal.   3. The mitral valve is abnormal. Trivial mitral valve regurgitation. The  mean mitral valve gradient is 2.0 mmHg with average heart rate of 63 bpm.  Moderate mitral annular calcification.   4. The aortic valve is tricuspid. There is mild calcification of the  aortic valve. There is mild thickening of the aortic valve. Aortic valve  regurgitation is not visualized. No aortic stenosis is present.   5. The inferior vena cava is normal in size with greater than 50%  respiratory variability, suggesting right atrial pressure of 3 mmHg.   Comparison(s): A prior study was performed on 09/22/2014. Prior images  unable to be directly viewed, comparison made by report only. Similar  function form prior.     EKG:  EKG is ordered today, personally reviewed, demonstrating NSR 87bpm with diffuse TWI I, II, III, avF, V2-V6 similar to prior, first degree AVB   Recent Labs: 05/17/2020: BUN 9; Creatinine, Ser 0.70; Hemoglobin 15.0; Platelets 249; Potassium 3.5; Sodium 140  Recent Lipid Panel No results found for: CHOL, TRIG, HDL, CHOLHDL, VLDL, LDLCALC, LDLDIRECT  PHYSICAL EXAM:    VS:  BP 132/84   Pulse 87   Ht 5\' 4"  (1.626 m)   Wt 196 lb 9.6 oz (89.2 kg)   SpO2 98%   BMI 33.75 kg/m   BMI: Body mass index is 33.75 kg/m.  GEN: Well nourished, well developed female in no acute distress HEENT: normocephalic, atraumatic Neck: no JVD, carotid bruits, or masses Cardiac: RRR; no murmurs, rubs, or gallops, no edema  Respiratory:  clear to auscultation bilaterally, normal work of breathing GI: soft, nontender, nondistended, + BS MS: no deformity or atrophy Skin: warm and dry, no rash Neuro:  Alert and Oriented x 3, Strength and sensation are intact, follows commands Psych: euthymic  mood, full affect  Wt Readings from Last 3 Encounters:  01/05/21 196 lb 9.6 oz (89.2 kg)  11/17/20 197 lb (89.4 kg)  11/06/20 195 lb 12.8 oz (88.8 kg)     ASSESSMENT & PLAN:   1. Lower  extremity edema - suspect related to amlodipine use, improved on lower dose. Will update BMET, TSH on file for completeness. Otherwise continue lower dose of amlodipine. She has noticed her BP intermittently at the 130 threshold or above. This is likely driven by the amlodipine dose reduction, so we will switch metoprolol to carvedilol. Also discussed echo results, lifestyle modification.  2. History of MVP - 2D echo reviewed, not seen on recent study - there was trivial MR. Continue to follow clinically in the future.  3. Essential HTN - Blood pressure is beginning to creep up at home. Will switch metoprolol to carvedilol 6.25mg  BID for improved control. Otherwise continue amlodipine, ARB/HCTZ. The patient was provided instructions on monitoring blood pressure at home for 1 week and relaying results to our office. If she finds values are frequently over 130 systolic, would titrate further to 12.5mg  BID with submission of home BPs 1 week thereafter.   4. Abnormal EKG - appears chronically abnormal back to the first tracing we have in 2015. She had normal coronaries in 2002 and is not describing any new anginal symptoms or dyspnea. Continue to follow clinically with risk factor modification. Dr. Katrinka Blazing has felt her EKG findings were due to LVH in the past.  Disposition: F/u with Dr. Katrinka Blazing in 6 months. Will also try to get copy of prior PCP labs earlier this year. Also follow-up with EP as previously advised in 2023.  Medication Adjustments/Labs and Tests Ordered: Current medicines are reviewed at length with the patient today.  Concerns regarding medicines are outlined above. Medication changes, Labs and Tests ordered today are summarized above and listed in the Patient Instructions accessible in Encounters.    Signed, Laurann Montana, PA-C  01/05/2021 9:50 AM    [facility] [address] [place], [place], [place]  [address] Phone: [phone]; Fax: [phone]

## 2021-01-05 ENCOUNTER — Encounter: Payer: Self-pay | Admitting: Physician Assistant

## 2021-01-05 ENCOUNTER — Ambulatory Visit: Payer: Medicare Other | Admitting: Physician Assistant

## 2021-01-05 ENCOUNTER — Telehealth: Payer: Self-pay | Admitting: Physician Assistant

## 2021-01-05 ENCOUNTER — Other Ambulatory Visit: Payer: Self-pay

## 2021-01-05 VITALS — BP 132/84 | HR 87 | Ht 64.0 in | Wt 196.6 lb

## 2021-01-05 DIAGNOSIS — R600 Localized edema: Secondary | ICD-10-CM

## 2021-01-05 DIAGNOSIS — R0609 Other forms of dyspnea: Secondary | ICD-10-CM

## 2021-01-05 DIAGNOSIS — R9431 Abnormal electrocardiogram [ECG] [EKG]: Secondary | ICD-10-CM | POA: Diagnosis not present

## 2021-01-05 DIAGNOSIS — I10 Essential (primary) hypertension: Secondary | ICD-10-CM

## 2021-01-05 DIAGNOSIS — I341 Nonrheumatic mitral (valve) prolapse: Secondary | ICD-10-CM | POA: Diagnosis not present

## 2021-01-05 DIAGNOSIS — I5032 Chronic diastolic (congestive) heart failure: Secondary | ICD-10-CM

## 2021-01-05 LAB — BASIC METABOLIC PANEL
BUN/Creatinine Ratio: 6 — ABNORMAL LOW (ref 12–28)
BUN: 5 mg/dL — ABNORMAL LOW (ref 8–27)
CO2: 26 mmol/L (ref 20–29)
Calcium: 9.9 mg/dL (ref 8.7–10.3)
Chloride: 103 mmol/L (ref 96–106)
Creatinine, Ser: 0.83 mg/dL (ref 0.57–1.00)
Glucose: 80 mg/dL (ref 65–99)
Potassium: 3.9 mmol/L (ref 3.5–5.2)
Sodium: 143 mmol/L (ref 134–144)
eGFR: 78 mL/min/{1.73_m2} (ref >59)

## 2021-01-05 LAB — TSH: TSH: 1.07 u[IU]/mL (ref 0.450–4.500)

## 2021-01-05 MED ORDER — CARVEDILOL 6.25 MG PO TABS: 6.2500 mg | ORAL_TABLET | Freq: Two times a day (BID) | ORAL | 3 refills | Status: DC

## 2021-01-05 NOTE — Telephone Encounter (Addendum)
   Labs reviewed from primary care - LDL was 228 at that time in 07/2020 with AST 43, ALT 55 in 10/2020. I do not see she has had any change in her lipid therapy since that time. Typically managed by primary care. Given the degree of how high this number was despite statin (with some abnormality in liver function), I would suggest we refer to the pharmD lipid clinic for further evaluation - she needs additional cholesterol control to prevent future cardiac issues. Mostafa Yuan PA-C

## 2021-01-05 NOTE — Patient Instructions (Addendum)
Medication Instructions:  Your physician has recommended you make the following change in your medication:   STOP Metoprolol  START Carvedilol 6.25 taking 1 tablet twice a day   *If you need a refill on your cardiac medications before your next appointment, please call your pharmacy*   Lab Work: TODAY:  BMET & TSH  If you have labs (blood work) drawn today and your tests are completely normal, you will receive your results only by: MyChart Message (if you have MyChart) OR A paper copy in the mail If you have any lab test that is abnormal or we need to change your treatment, we will call you to review the results.   Testing/Procedures: None ordere   Follow-Up: At [facility], you and your health needs are our priority.  As part of our continuing mission to provide you with exceptional heart care, we have created designated Provider Care Teams.  These Care Teams include your primary Cardiologist (physician) and Advanced Practice Providers (APPs -  Physician Assistants and Nurse Practitioners) who all work together to provide you with the care you need, when you need it.  We recommend signing up for the patient portal called "MyChart".  Sign up information is provided on this After Visit Summary.  MyChart is used to connect with patients for Virtual Visits (Telemedicine).  Patients are able to view lab/test results, encounter notes, upcoming appointments, etc.  Non-urgent messages can be sent to your provider as well.   To learn more about what you can do with MyChart, go to [email or web address].    Your next appointment:   6 month(s)   06/30/21 arrive at 8:45  The format for your next appointment:   In Person  Provider:   Verdis Prime, MD   Other Instructions    We need to get a better idea of what your blood pressure is running at home. Here are some instructions to follow: - I would recommend using a blood pressure cuff that goes on your arm. The wrist ones can be  inaccurate. If you're purchasing one for the first time, try to select one that also reports your heart rate because this can be helpful information as well. - To check your blood pressure, choose a time at least 3 hours after taking your blood pressure medicines. If you can sample it at different times of the day, that's great - it might give you more information about how your blood pressure fluctuates. Remain seated in a chair for 5 minutes quietly beforehand, then check it.  - Please record a list of those readings and call us/send in MyChart message with them for our review in 1 week.

## 2021-01-06 NOTE — Telephone Encounter (Signed)
Discussed lab results from PCP's office with pt.  Pt states that these #'s aren't correct, that she had labs last month. Pt states she will call her PCP's office and have them send [place] the updated labs, as per pt, she was told that both of her Cholesterol #'s were down.  Provided pt with my fax # so she can have them faxed to my attention.

## 2021-01-06 NOTE — Telephone Encounter (Signed)
Received updated labs from pt's pcp:    Will have medical records scan to pt's chart and send to [facility], PA-C.

## 2021-01-06 NOTE — Telephone Encounter (Signed)
Call placed to pt, left a message for her to call back.

## 2021-01-06 NOTE — Progress Notes (Signed)
Pt has been made aware of normal result and verbalized understanding.  jw

## 2021-01-06 NOTE — Telephone Encounter (Signed)
Follow up:   Patient returning a call back.  

## 2021-01-07 NOTE — Telephone Encounter (Signed)
Thank you for this update! Repeat labs 10/2020 reviewed - Tchol 151, trig 81, HDL 48, LDL 87. These are much more acceptable. Hold off lipid clinic referral. Thank you. Sylvia Anthony

## 2021-01-07 NOTE — Telephone Encounter (Signed)
Called pt to let her know that we got her most recent lab work and her Cholesterol is much more acceptable.

## 2021-01-11 DIAGNOSIS — S335XXA Sprain of ligaments of lumbar spine, initial encounter: Secondary | ICD-10-CM | POA: Diagnosis not present

## 2021-01-11 DIAGNOSIS — M9903 Segmental and somatic dysfunction of lumbar region: Secondary | ICD-10-CM | POA: Diagnosis not present

## 2021-01-12 ENCOUNTER — Telehealth: Payer: Self-pay | Admitting: Interventional Cardiology

## 2021-01-12 NOTE — Telephone Encounter (Signed)
Will route to Dr. Katrinka Blazing to review as Ronie Spies, PA-C is out of office this week.  Pt recently seen Dayna on 8/9 and Metoprolol was stopped and Coreg 6.25mg  BID was started.

## 2021-01-12 NOTE — Telephone Encounter (Signed)
Pt is calling to give her BP readings to the nurse  124/78 127/81 120/78 133/87 141/90 135/85

## 2021-01-15 NOTE — Telephone Encounter (Signed)
BP is better and generally at goal. Continue same therapy.  ----- Message -----  From: Julio Sicks, RN  Sent: 01/12/2021  11:46 AM EDT  To: Lyn Records, MD, Julio Sicks, RN    Spoke with pt and made her aware of information from Dr. Katrinka Blazing.  Advised if she notices her BP remaining elevated for a few days in a row, please let us know.  Pt verbalized understanding and was appreciative for call.

## 2021-01-25 DIAGNOSIS — M9903 Segmental and somatic dysfunction of lumbar region: Secondary | ICD-10-CM | POA: Diagnosis not present

## 2021-01-25 DIAGNOSIS — Z1231 Encounter for screening mammogram for malignant neoplasm of breast: Secondary | ICD-10-CM | POA: Diagnosis not present

## 2021-01-25 DIAGNOSIS — S335XXA Sprain of ligaments of lumbar spine, initial encounter: Secondary | ICD-10-CM | POA: Diagnosis not present

## 2021-01-27 DIAGNOSIS — I10 Essential (primary) hypertension: Secondary | ICD-10-CM | POA: Diagnosis not present

## 2021-01-27 DIAGNOSIS — K219 Gastro-esophageal reflux disease without esophagitis: Secondary | ICD-10-CM | POA: Diagnosis not present

## 2021-01-27 DIAGNOSIS — E039 Hypothyroidism, unspecified: Secondary | ICD-10-CM | POA: Diagnosis not present

## 2021-01-28 DIAGNOSIS — S335XXA Sprain of ligaments of lumbar spine, initial encounter: Secondary | ICD-10-CM | POA: Diagnosis not present

## 2021-01-28 DIAGNOSIS — M9903 Segmental and somatic dysfunction of lumbar region: Secondary | ICD-10-CM | POA: Diagnosis not present

## 2021-02-02 DIAGNOSIS — M9903 Segmental and somatic dysfunction of lumbar region: Secondary | ICD-10-CM | POA: Diagnosis not present

## 2021-02-02 DIAGNOSIS — S335XXA Sprain of ligaments of lumbar spine, initial encounter: Secondary | ICD-10-CM | POA: Diagnosis not present

## 2021-02-04 DIAGNOSIS — S335XXA Sprain of ligaments of lumbar spine, initial encounter: Secondary | ICD-10-CM | POA: Diagnosis not present

## 2021-02-04 DIAGNOSIS — M9903 Segmental and somatic dysfunction of lumbar region: Secondary | ICD-10-CM | POA: Diagnosis not present

## 2021-02-05 DIAGNOSIS — S335XXA Sprain of ligaments of lumbar spine, initial encounter: Secondary | ICD-10-CM | POA: Diagnosis not present

## 2021-02-05 DIAGNOSIS — M9903 Segmental and somatic dysfunction of lumbar region: Secondary | ICD-10-CM | POA: Diagnosis not present

## 2021-02-08 ENCOUNTER — Ambulatory Visit (INDEPENDENT_AMBULATORY_CARE_PROVIDER_SITE_OTHER)
Admission: RE | Admit: 2021-02-08 | Discharge: 2021-02-08 | Disposition: A | Discharge status: Home / Self Care | Payer: Medicare Other | Source: Ambulatory Visit | Attending: Emergency Medicine | Admitting: Emergency Medicine

## 2021-02-08 ENCOUNTER — Other Ambulatory Visit: Payer: Self-pay

## 2021-02-08 DIAGNOSIS — R911 Solitary pulmonary nodule: Secondary | ICD-10-CM | POA: Diagnosis not present

## 2021-02-08 DIAGNOSIS — I700 Atherosclerosis of aorta: Secondary | ICD-10-CM | POA: Diagnosis not present

## 2021-02-09 DIAGNOSIS — M9903 Segmental and somatic dysfunction of lumbar region: Secondary | ICD-10-CM | POA: Diagnosis not present

## 2021-02-09 DIAGNOSIS — S335XXA Sprain of ligaments of lumbar spine, initial encounter: Secondary | ICD-10-CM | POA: Diagnosis not present

## 2021-02-10 DIAGNOSIS — M9903 Segmental and somatic dysfunction of lumbar region: Secondary | ICD-10-CM | POA: Diagnosis not present

## 2021-02-10 DIAGNOSIS — S335XXA Sprain of ligaments of lumbar spine, initial encounter: Secondary | ICD-10-CM | POA: Diagnosis not present

## 2021-02-10 DIAGNOSIS — R928 Other abnormal and inconclusive findings on diagnostic imaging of breast: Secondary | ICD-10-CM | POA: Diagnosis not present

## 2021-02-10 DIAGNOSIS — R922 Inconclusive mammogram: Secondary | ICD-10-CM | POA: Diagnosis not present

## 2021-02-12 DIAGNOSIS — M9903 Segmental and somatic dysfunction of lumbar region: Secondary | ICD-10-CM | POA: Diagnosis not present

## 2021-02-12 DIAGNOSIS — S335XXA Sprain of ligaments of lumbar spine, initial encounter: Secondary | ICD-10-CM | POA: Diagnosis not present

## 2021-02-15 DIAGNOSIS — I739 Peripheral vascular disease, unspecified: Secondary | ICD-10-CM | POA: Diagnosis not present

## 2021-02-15 DIAGNOSIS — M545 Low back pain, unspecified: Secondary | ICD-10-CM | POA: Diagnosis not present

## 2021-02-15 DIAGNOSIS — R350 Frequency of micturition: Secondary | ICD-10-CM | POA: Diagnosis not present

## 2021-02-15 DIAGNOSIS — Z299 Encounter for prophylactic measures, unspecified: Secondary | ICD-10-CM | POA: Diagnosis not present

## 2021-02-15 DIAGNOSIS — I10 Essential (primary) hypertension: Secondary | ICD-10-CM | POA: Diagnosis not present

## 2021-02-16 DIAGNOSIS — M9903 Segmental and somatic dysfunction of lumbar region: Secondary | ICD-10-CM | POA: Diagnosis not present

## 2021-02-16 DIAGNOSIS — S335XXA Sprain of ligaments of lumbar spine, initial encounter: Secondary | ICD-10-CM | POA: Diagnosis not present

## 2021-02-17 DIAGNOSIS — M9903 Segmental and somatic dysfunction of lumbar region: Secondary | ICD-10-CM | POA: Diagnosis not present

## 2021-02-17 DIAGNOSIS — S335XXA Sprain of ligaments of lumbar spine, initial encounter: Secondary | ICD-10-CM | POA: Diagnosis not present

## 2021-02-19 DIAGNOSIS — M9903 Segmental and somatic dysfunction of lumbar region: Secondary | ICD-10-CM | POA: Diagnosis not present

## 2021-02-19 DIAGNOSIS — S335XXA Sprain of ligaments of lumbar spine, initial encounter: Secondary | ICD-10-CM | POA: Diagnosis not present

## 2021-02-23 ENCOUNTER — Other Ambulatory Visit: Payer: Self-pay

## 2021-02-23 ENCOUNTER — Ambulatory Visit: Payer: Medicare Other | Admitting: Emergency Medicine

## 2021-02-23 ENCOUNTER — Encounter: Payer: Self-pay | Admitting: Emergency Medicine

## 2021-02-23 DIAGNOSIS — R911 Solitary pulmonary nodule: Secondary | ICD-10-CM | POA: Diagnosis not present

## 2021-02-23 NOTE — Assessment & Plan Note (Signed)
Nodular focus in the anterior right lower lobe continues to improve, smaller, now 11 x 14 mm.  Unknown etiology but inconsistent with malignancy.  I do not think she needs a dedicated repeat CT at this time.  We can consider doing so to evaluate for inflammatory cause for her nodular change if she develops new symptoms.  We will follow-up annually to ensure that she is clinically stable and no indication for repeat imaging.  We reviewed your CT scan of the chest today.  Your peripheral right lower lobe nodular area continues to get smaller, consistent with some postinflammatory scarring.  This is good news.  We will not order a repeat CT scan of the chest right now.  We could consider doing so if you develop any new symptoms. Follow Dr. Delton Coombes in 1 year or sooner if you have any problems.

## 2021-02-23 NOTE — Progress Notes (Signed)
Subjective:    Patient ID: Sylvia Anthony, female    DOB: 02/09/1956, 65 y.o.   MRN: [id number]  HPI 65 year old woman, former minimal smoker (1 pack year) with a history of arterial fibromuscular dysplasia, hypertension, hypothyroidism, mitral valve prolapse, sick sinus syndrome with a pacemaker in place.   ROV 03/16/20 --65 year old woman with arterial fibromuscular dysplasia, hypertension, MVP, sick sinus syndrome with pacer, panuveitis.  We have been following a right peripheral lower lobe multilobulated nodule that measures 2.0 x 1.4 x 0.9 cm it has been overall stable in size.  It was a bit smaller on a CT chest/14/21 and bronchoscopy was deferred based on that scan.  She now has had repeat scans done 12/16/2019 and 02/12/2020 that I have reviewed, showed that the nodular area persists but has not changed over time.   ROV 09/01/20 --this follow-up visit 65 year old woman with history of hypertension, mitral valve prolapse, sick sinus syndrome with a pacemaker.  Also panuveitis and an arterial fibromuscular dysplasia.  We have been following a multilobulated right lower lobe pulmonary nodule with serial CT scans of the chest.  She underwent a CT chest 08/11/2020 which I have reviewed, shows that the branching nodular opacity is now 1.8 x 1.3 cm, slightly smaller than her most recent scans, reassuring.  ROV 02/23/21 --pleasant 65 year old woman whom I have followed for an irregularly shaped peripheral lobulated right lower lobe pulmonary nodule.  Its been overall stable in size, slightly smaller in March 2022.  She is undergone follow-up scan as below. PMH: Arterial fibromuscular dysplasia, hypertension, hypothyroidism, mitral valve prolapse, sick sinus syndrome with a pacemaker. Today she reports  CT scan of the chest 02/08/2021 reviewed by me, shows that her clustered peribronchovascular nodularity in the anterior right lower lobe continues to get smaller, now 11 x 14 mm.  Other scattered  small pulmonary nodules are all stable in size, benign.  She has some calcified right hilar nodes, no other enlarged nodes.  Review of Systems As per HPI      Objective:   Physical Exam Vitals:   02/23/21 1113  BP: 128/80  Pulse: 79  Temp: 97.6 F (36.4 C)  TempSrc: Oral  SpO2: 98%  Weight: 194 lb (88 kg)  Height: 5\' 4"  (1.626 m)   Gen: Pleasant, well-nourished, in no distress,  normal affect  ENT: No lesions,  mouth clear,  oropharynx clear, no postnasal drip  Neck: No JVD, no stridor  Lungs: No use of accessory muscles, no crackles or wheezing on normal respiration, no wheeze on forced expiration  Cardiovascular: RRR, heart sounds normal, no murmur or gallops, no peripheral edema  Musculoskeletal: No deformities, no cyanosis or clubbing  Neuro: alert, awake, non focal  Skin: Warm, no lesions or ras     Assessment & Plan:  Pulmonary nodule 1 cm or greater in diameter Nodular focus in the anterior right lower lobe continues to improve, smaller, now 11 x 14 mm.  Unknown etiology but inconsistent with malignancy.  I do not think she needs a dedicated repeat CT at this time.  We can consider doing so to evaluate for inflammatory cause for her nodular change if she develops new symptoms.  We will follow-up annually to ensure that she is clinically stable and no indication for repeat imaging.  We reviewed your CT scan of the chest today.  Your peripheral right lower lobe nodular area continues to get smaller, consistent with some postinflammatory scarring.  This is good news.  We will not  order a repeat CT scan of the chest right now.  We could consider doing so if you develop any new symptoms. Follow Dr. Delton Coombes in 1 year or sooner if you have any problems.  Sylvia Pupa, MD, PhD 02/23/2021, 11:32 AM [facility] [phone] or if no answer [phone]

## 2021-02-23 NOTE — Patient Instructions (Signed)
We reviewed your CT scan of the chest today.  Your peripheral right lower lobe nodular area continues to get smaller, consistent with some postinflammatory scarring.  This is good news.  We will not order a repeat CT scan of the chest right now.  We could consider doing so if you develop any new symptoms. Follow Dr. Delton Coombes in 1 year or sooner if you have any problems.

## 2021-02-25 DIAGNOSIS — M9903 Segmental and somatic dysfunction of lumbar region: Secondary | ICD-10-CM | POA: Diagnosis not present

## 2021-02-25 DIAGNOSIS — S335XXA Sprain of ligaments of lumbar spine, initial encounter: Secondary | ICD-10-CM | POA: Diagnosis not present

## 2021-02-26 DIAGNOSIS — I10 Essential (primary) hypertension: Secondary | ICD-10-CM | POA: Diagnosis not present

## 2021-03-01 DIAGNOSIS — Z299 Encounter for prophylactic measures, unspecified: Secondary | ICD-10-CM | POA: Diagnosis not present

## 2021-03-01 DIAGNOSIS — R7989 Other specified abnormal findings of blood chemistry: Secondary | ICD-10-CM | POA: Diagnosis not present

## 2021-03-01 DIAGNOSIS — I10 Essential (primary) hypertension: Secondary | ICD-10-CM | POA: Diagnosis not present

## 2021-03-01 DIAGNOSIS — Z23 Encounter for immunization: Secondary | ICD-10-CM | POA: Diagnosis not present

## 2021-03-01 DIAGNOSIS — I700 Atherosclerosis of aorta: Secondary | ICD-10-CM | POA: Diagnosis not present

## 2021-03-01 DIAGNOSIS — E7800 Pure hypercholesterolemia, unspecified: Secondary | ICD-10-CM | POA: Diagnosis not present

## 2021-03-01 DIAGNOSIS — Z789 Other specified health status: Secondary | ICD-10-CM | POA: Diagnosis not present

## 2021-03-02 DIAGNOSIS — M9903 Segmental and somatic dysfunction of lumbar region: Secondary | ICD-10-CM | POA: Diagnosis not present

## 2021-03-02 DIAGNOSIS — S335XXA Sprain of ligaments of lumbar spine, initial encounter: Secondary | ICD-10-CM | POA: Diagnosis not present

## 2021-03-10 ENCOUNTER — Ambulatory Visit (INDEPENDENT_AMBULATORY_CARE_PROVIDER_SITE_OTHER): Payer: Medicare Other

## 2021-03-10 DIAGNOSIS — I495 Sick sinus syndrome: Secondary | ICD-10-CM | POA: Diagnosis not present

## 2021-03-11 LAB — CUP PACEART REMOTE DEVICE CHECK
Battery Remaining Longevity: 139 mo
Battery Voltage: 3.03 V
Brady Statistic AP VP Percent: 0.12 %
Brady Statistic AP VS Percent: 80.83 %
Brady Statistic AS VP Percent: 0.01 %
Brady Statistic AS VS Percent: 19.04 %
Brady Statistic RA Percent Paced: 81.26 %
Brady Statistic RV Percent Paced: 0.13 %
Date Time Interrogation Session: 20221011230302
Implantable Lead Implant Date: 19970416
Implantable Lead Implant Date: 19970416
Implantable Lead Location: 753859
Implantable Lead Location: 753860
Implantable Lead Model: 5034
Implantable Lead Model: 5534
Implantable Pulse Generator Implant Date: 20200113
Lead Channel Impedance Value: 627 Ohm
Lead Channel Impedance Value: 703 Ohm
Lead Channel Impedance Value: 722 Ohm
Lead Channel Impedance Value: 722 Ohm
Lead Channel Pacing Threshold Amplitude: 0.625 V
Lead Channel Pacing Threshold Amplitude: 0.875 V
Lead Channel Pacing Threshold Pulse Width: 0.4 ms
Lead Channel Pacing Threshold Pulse Width: 0.4 ms
Lead Channel Sensing Intrinsic Amplitude: 18 mV
Lead Channel Sensing Intrinsic Amplitude: 18 mV
Lead Channel Sensing Intrinsic Amplitude: 2.25 mV
Lead Channel Sensing Intrinsic Amplitude: 2.25 mV
Lead Channel Setting Pacing Amplitude: 1.5 V
Lead Channel Setting Pacing Amplitude: 2.5 V
Lead Channel Setting Pacing Pulse Width: 0.4 ms
Lead Channel Setting Sensing Sensitivity: 2 mV

## 2021-03-12 DIAGNOSIS — S335XXA Sprain of ligaments of lumbar spine, initial encounter: Secondary | ICD-10-CM | POA: Diagnosis not present

## 2021-03-12 DIAGNOSIS — M9903 Segmental and somatic dysfunction of lumbar region: Secondary | ICD-10-CM | POA: Diagnosis not present

## 2021-03-18 NOTE — Progress Notes (Signed)
Remote pacemaker transmission.   

## 2021-03-24 ENCOUNTER — Ambulatory Visit: Payer: Medicare Other | Admitting: Internal Medicine

## 2021-03-29 DIAGNOSIS — I10 Essential (primary) hypertension: Secondary | ICD-10-CM | POA: Diagnosis not present

## 2021-04-02 DIAGNOSIS — M9903 Segmental and somatic dysfunction of lumbar region: Secondary | ICD-10-CM | POA: Diagnosis not present

## 2021-04-02 DIAGNOSIS — S335XXA Sprain of ligaments of lumbar spine, initial encounter: Secondary | ICD-10-CM | POA: Diagnosis not present

## 2021-04-13 DIAGNOSIS — M9903 Segmental and somatic dysfunction of lumbar region: Secondary | ICD-10-CM | POA: Diagnosis not present

## 2021-04-13 DIAGNOSIS — S335XXA Sprain of ligaments of lumbar spine, initial encounter: Secondary | ICD-10-CM | POA: Diagnosis not present

## 2021-04-27 ENCOUNTER — Ambulatory Visit (INDEPENDENT_AMBULATORY_CARE_PROVIDER_SITE_OTHER): Payer: Medicare Other | Admitting: Ophthalmology

## 2021-04-27 ENCOUNTER — Other Ambulatory Visit: Payer: Self-pay

## 2021-04-27 ENCOUNTER — Encounter (INDEPENDENT_AMBULATORY_CARE_PROVIDER_SITE_OTHER): Payer: Self-pay | Admitting: Ophthalmology

## 2021-04-27 DIAGNOSIS — I10 Essential (primary) hypertension: Secondary | ICD-10-CM

## 2021-04-27 DIAGNOSIS — H44113 Panuveitis, bilateral: Secondary | ICD-10-CM | POA: Diagnosis not present

## 2021-04-27 DIAGNOSIS — H25813 Combined forms of age-related cataract, bilateral: Secondary | ICD-10-CM | POA: Diagnosis not present

## 2021-04-27 DIAGNOSIS — R911 Solitary pulmonary nodule: Secondary | ICD-10-CM | POA: Diagnosis not present

## 2021-04-27 DIAGNOSIS — H209 Unspecified iridocyclitis: Secondary | ICD-10-CM

## 2021-04-27 DIAGNOSIS — H35033 Hypertensive retinopathy, bilateral: Secondary | ICD-10-CM

## 2021-04-27 DIAGNOSIS — H40053 Ocular hypertension, bilateral: Secondary | ICD-10-CM

## 2021-04-27 DIAGNOSIS — H3581 Retinal edema: Secondary | ICD-10-CM

## 2021-04-27 DIAGNOSIS — H35353 Cystoid macular degeneration, bilateral: Secondary | ICD-10-CM

## 2021-04-27 DIAGNOSIS — H40003 Preglaucoma, unspecified, bilateral: Secondary | ICD-10-CM | POA: Diagnosis not present

## 2021-04-27 NOTE — Progress Notes (Deleted)
[facility] Note  04/27/2021     CHIEF COMPLAINT Patient presents for No chief complaint on file.   HISTORY OF PRESENT ILLNESS: Sylvia Anthony is a 65 y.o. female who presents to the clinic today for:    pt is using brimonidine and Cosopt TID OU, no new health concerns  Referring physician: Glenda Chroman, MD [place],  [place] [address]  HISTORICAL INFORMATION:   Selected notes from the MEDICAL RECORD NUMBER Referred by [name] for concern of bilateral vitreitis.   CURRENT MEDICATIONS: No current outpatient medications on file. (Ophthalmic Drugs)   No current facility-administered medications for this visit. (Ophthalmic Drugs)   Current Outpatient Medications (Other)  Medication Sig   acetaminophen (TYLENOL) 500 MG tablet Take 500 mg by mouth every 6 (six) hours as needed.   albuterol (PROVENTIL HFA;VENTOLIN HFA) 108 (90 BASE) MCG/ACT inhaler Inhale 2 puffs into the lungs every 6 (six) hours as needed for wheezing or shortness of breath.   amLODipine (NORVASC) 5 MG tablet Take 1 tablet (5 mg total) by mouth daily.   aspirin EC 81 MG tablet Take 1 tablet (81 mg total) by mouth daily.   carvedilol (COREG) 6.25 MG tablet Take 1 tablet (6.25 mg total) by mouth 2 (two) times daily.   cetirizine (ZYRTEC) 10 MG tablet Take 10 mg by mouth at bedtime as needed for allergies.    diclofenac (VOLTAREN) 75 MG EC tablet Take 75 mg by mouth daily as needed for mild pain.   levothyroxine (SYNTHROID, LEVOTHROID) 112 MCG tablet Take 112 mcg by mouth daily before breakfast.    meclizine (ANTIVERT) 25 MG tablet Take 1 tablet (25 mg total) by mouth 3 (three) times daily as needed for dizziness.   nitroGLYCERIN (NITROSTAT) 0.4 MG SL tablet Place 1 tablet (0.4 mg total) under the tongue every 5 (five) minutes as needed for chest pain.   pantoprazole (PROTONIX) 40 MG tablet Take 1 tablet (40 mg total) by mouth 2 (two) times daily before a meal.   potassium  chloride SA (K-DUR,KLOR-CON) 20 MEQ tablet Take 20 mEq by mouth daily.   rosuvastatin (CRESTOR) 20 MG tablet Take 20 mg by mouth daily.   valsartan-hydrochlorothiazide (DIOVAN-HCT) 320-25 MG per tablet Take 1 tablet by mouth daily.   No current facility-administered medications for this visit. (Other)      REVIEW OF SYSTEMS:     ALLERGIES Allergies  Allergen Reactions   Gabapentin     SWELLING "OUT OF IT" 12/23/16    PAST MEDICAL HISTORY Past Medical History:  Diagnosis Date   Anemia    Arm numbness    Cataract    Mixed form OU   Fibromyalgia    GERD (gastroesophageal reflux disease)    Glucose intolerance (impaired glucose tolerance)    H. pylori infection 01/2012   Amoxicillin, Biaxin   Hyperlipidemia    Hypertension    Hypertensive retinopathy    OU   Hypothyroidism    Mitral valve prolapse    not seen on recent echoes   Rheumatic fever    age 40, was at [facility] for [facility] weeks   SSS (sick sinus syndrome) ([place])    1997 ppm, gen change 2020 (MDT)   Past Surgical History:  Procedure Laterality Date   ABDOMINAL HYSTERECTOMY     partial   APPENDECTOMY     CARDIAC CATHETERIZATION     multiple   CHOLECYSTECTOMY     COLONOSCOPY  08/01/2006   3 mm descending colon polyp removed/8 mm sessile ascending colon polyp removed / 3-mm rectal  polyp removed /Rare sigmoid diverticulosis/ Moderate internal hemorrhoids.Advanced adenoma on colonoscopy in March 2008.  The polyp was anadenomatous polyp with a foci of high-grade dysplasia   COLONOSCOPY  09/08/2008   SIMPLE ADENOMA/HYPERPLASTIC POLY/Multiple colon polyps (ascending, sigmoid, rectal)  Mild sigmoid colon diverticulosis./ Small internal hemorrhoids   COLONOSCOPY N/A 12/11/2017   Dr. Oneida Alar: diverticulosis, hemorrhoids next surveillance tcs 5 years.    COLONOSCOPY WITH ESOPHAGOGASTRODUODENOSCOPY (EGD)  Sept. 30, 2013   JSH:FWYO diverticulosis was noted in the sigmoid colon/The colon was otherwise normal/Small  internal hemorrhoids/EGD:The mucosa of the esophagus appeared normal/Non-erosive gastritis (inflammation) was found; multiple bx/The duodenal mucosa showed no abnormalities. +H.pylori gastritis, treated with equivalent of prevpac. SAVARY DILATION   ESOPHAGEAL DILATION N/A 10/07/2014   Procedure: ESOPHAGEAL DILATION;  Surgeon: Danie Binder, MD;  Location: [facility];  Service: Endoscopy;  Laterality: N/A;   ESOPHAGOGASTRODUODENOSCOPY N/A 10/07/2014   Dr. Oneida Alar: moderate non-erosive gastritis, no definite stricture, empiric dilation . negative H.pylori    growth removed from intestine     [facility]-as teenager, done through colonoscopy   [place]  09/18/2006   generator change by Dr Leonia Reeves with a MDT [place]  09/13/1995   for sick sinus syndome and syncope at [place] N/A 06/11/2018    [employer]onic Azure XT DR MRI SureScan model [id number] (serial number [id number]) pacemaker by Dr Rayann Heman   right Achilles tendon     X 2   TONSILLECTOMY      FAMILY HISTORY Family History  Problem Relation Age of Onset   Hyperlipidemia Mother    Hypertension Mother    Heart disease Father    Hypertension Father    Hyperlipidemia Sister    Hypertension Sister    Colon polyps Sister    Hyperlipidemia Brother    Hypertension Brother    Colon polyps Brother    Colon cancer Neg Hx     SOCIAL HISTORY Social History   Tobacco Use   Smoking status: Former    Packs/day: 0.25    Years: 4.00    Pack years: 1.00    Types: Cigarettes    Quit date: 10/09/1985    Years since quitting: 35.5   Smokeless tobacco: Never   Tobacco comments:    just in teens  Vaping Use   Vaping Use: Never used  Substance Use Topics   Alcohol use: No    Alcohol/week: 0.0 standard drinks   Drug use: No         OPHTHALMIC EXAM:  Not recorded     IMAGING AND PROCEDURES  Imaging and Procedures for @TODAY @            ASSESSMENT/PLAN:     ICD-10-CM   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 Cystoid macular edema of both eyes  H35.353     3. Retinal edema  H35.81     4. Uveitis  H20.9     5. Essential hypertension  I10     6. Hypertensive retinopathy of both eyes  H35.033     7. Combined forms of age-related cataract of both eyes  H25.813     8. Nodule of right lung  R91.1     9. Bilateral ocular hypertension  H40.053       1-4. Mild Panuveitis w/ CME OU --  improved / stably resolved tapered off drops  - pt reports 1+ mo history of floaters and decreased vision OU; +photophobia  - saw [name], who noted [facility] cell and started PF q1h  - initial exam here showed +cell/pigment in [facility] and vitreous cavity; mild vitreous haze and +central CME OU  - FA (02.15.21) showed central petaloid hyperfluorescence and hyperfluorescence of the optic disc  - s/p STK OD (05.25.21)  - pt reports history of fibromyalgia, family history of lupus  - initiated uveitis lab work up -- all WNL except +toxo IgG   CBC, CMP   RPR, VDRL, FTA-Abs, MHA   HIV, Lyme, Quant-Gold   Toxoplasma titers   HLA Panel   ANA   ANCA   ACE, Lysozyme   RF   ESR, CRP   CXR  - chest x-ray without TB or pulmonary sarcoidosis, but did reveal a 1.6cm nodular mass in right lung base  - hx of Covid-19 infection ?involvement  - repeat FA (08.23.21) shows interval improvement in perifoveal petaloid leakage OU  - OCT shows OD: stable improvement of IRF/CME; OS: Stable resolution of CME OS  - discussed findings  - IOP improved today to 22 -- on Cosopt and Brim (likely steroid response)  - completed PF and Prolensa taper   - cont Cosopt BID OU  - brimonidine TID OU  - STK informed consent obtained, signed and scanned, 05.25.21  - see procedure note   - f/u 4-6 weeks -- DFE/OCT, IOP check  5,6. Hypertensive retinopathy OU  - discussed importance of tight BP control  - monitor   7. Mixed form age related cataract OU  - The symptoms of cataract, surgical  options, and treatments and risks were discussed with patient.  - discussed diagnosis and progression  - not yet visually significant  - monitor for now  8. 1.6 cm lung nodule of R lung base found on CXR  - discussed with Dr. Woody Seller as above  - PET imaging done on 3.11.21 -- identifying some metabolic lesions (see report below)  - CT chest performed 4.14.21 -- indicating mild dec in some dimensions (see report below)  - pt saw [name] who had originally scheduled a bronchoscopy, but was cancelled following the CT evidence of decreasing size on CT  9. Ocular Hypertension OU  - IOP improved today  - likely steroid response   - reviewed instructions: Cosopt BID OU and brimonidine TID OU   Ophthalmic Meds Ordered this visit:  No orders of the defined types were placed in this encounter.  This document serves as a record of services personally performed by Gardiner Sleeper, MD, PhD. It was created on their behalf by Leonie Douglas, an ophthalmic technician. The creation of this record is the provider's dictation and/or activities during the visit.    Electronically signed by: Leonie Douglas COA, 04/27/21  8:00 AM   Gardiner Sleeper, M.D., Ph.D. Diseases & Surgery of the Retina and [place] 04/27/2021  I have reviewed the above documentation for accuracy and completeness, and I agree with the above. Gardiner Sleeper, M.D., Ph.D. 03/09/20 8:00 AM   Abbreviations: M myopia (nearsighted); A astigmatism; H hyperopia (farsighted); P presbyopia; Mrx spectacle prescription;  CTL contact lenses; OD right eye; OS left eye; OU both eyes  XT exotropia; ET esotropia; PEK punctate epithelial keratitis; PEE punctate epithelial erosions; DES dry eye syndrome; MGD meibomian gland dysfunction; ATs artificial tears; PFAT's preservative free artificial tears;  [place] nuclear sclerotic cataract; PSC posterior subcapsular cataract; ERM epi-retinal membrane; PVD posterior vitreous  detachment; RD retinal detachment; DM diabetes mellitus; DR diabetic retinopathy; NPDR non-proliferative diabetic retinopathy; PDR proliferative diabetic retinopathy; CSME clinically significant macular edema; DME diabetic macular edema; dbh dot blot hemorrhages; CWS cotton wool spot; POAG primary open angle glaucoma; C/D cup-to-disc ratio; HVF humphrey visual field; GVF goldmann visual field; OCT optical coherence tomography; IOP intraocular pressure; BRVO Branch retinal vein occlusion; CRVO central retinal vein occlusion; CRAO central retinal artery occlusion; BRAO branch retinal artery occlusion; RT retinal tear; SB scleral buckle; PPV pars plana vitrectomy; VH Vitreous hemorrhage; PRP panretinal laser photocoagulation; IVK intravitreal kenalog; VMT vitreomacular traction; MH Macular hole;  NVD neovascularization of the disc; NVE neovascularization elsewhere; AREDS age related eye disease study; ARMD age related macular degeneration; POAG primary open angle glaucoma; EBMD epithelial/anterior basement membrane dystrophy; ACIOL anterior chamber intraocular lens; IOL intraocular lens; PCIOL posterior chamber intraocular lens; Phaco/IOL phacoemulsification with intraocular lens placement; PRK photorefractive keratectomy; LASIK laser assisted in situ keratomileusis; HTN hypertension; DM diabetes mellitus; COPD chronic obstructive pulmonary disease   IMAGING:  NM PET Image (3.11.21)  FINDINGS: Mediastinal blood pool activity: SUV max 2.5   Liver activity: SUV max 3.5   NECK: No hypermetabolic lymph nodes in the neck.   Incidental CT findings: none   CHEST: Within the RIGHT lower lobe, tight cluster of nodules measures 1.4 x 1.3 cm (image 77/4) has mild to moderate metabolic activity for size (SUV max equal 2.9).   There is hypermetabolic activity associated the subcarinal lymph node which is normal size at 9 mm but fairly intense metabolic activity SUV max equal 5.0. More mild activity associated  with a partially calcified small RIGHT hilar node with SUV max equal 3.8.   Incidental CT findings: none   ABDOMEN/PELVIS: No abnormal hypermetabolic activity within the liver, pancreas, adrenal glands, or spleen. No hypermetabolic lymph nodes in the abdomen or pelvis.   Incidental CT findings: none   SKELETON: No focal hypermetabolic activity to suggest skeletal metastasis.   Incidental CT findings: none   IMPRESSION: 1. Mild-to-moderate metabolic activity associated with irregular RIGHT lobe pulmonary nodule. Hypermetabolic subcarinal lymph node. Findings remain indeterminate for inflammatory nodule and lymphadenopathy versus malignancy. There is evidence of granulomatous disease with calcified RIGHT hilar lymph node. Recommend either short-term CT follow-up (3 months) or tissue sampling. 2. No distant metastatic disease.  CT Chest w/o contrast (4.13.21)  FINDINGS: Cardiovascular: Right-sided dual lead pacer device in place, power pack over the left chest. Leads appear in similar position to the prior exam. Heart size mildly enlarged. No pericardial effusion. The calcified atherosclerotic changes of the thoracic aorta without aneurysmal dilation. Main pulmonary artery is mildly dilated.   Mediastinum/Nodes: Stable mildly enlarged subcarinal and right paratracheal lymph nodes just at or less than a cm. No hilar adenopathy. No axillary adenopathy. Thoracic inlet structures are normal.   Lungs/Pleura: Irregular area in the right lower lobe anteriorly, in total measuring approximately 1.7 cm by 1.2 cm. Grouped nodules at the periphery. The dominant area measuring approximately 1.3 cm greatest dimension with extension to the pleural surface along the right lateral chest. The lesion abuts the major fissure. On the sagittal and coronal images the lesion appears to have decreased in size compared to the prior study, dominant area measuring 6-7 mm greatest thickness on  sagittal image 44 of series 6 as compared to 9-10 mm on the previous study, image 36 of series 7 on the 07/22/2018 exam.  Axial measurements are provided across the greatest extent of the lesion which appears to be less dense in the axial plane within the central portion of the measured area across grouped nodules at the periphery and the dominant nodule more centrally.   On coronal image 78 of series 5 the lesion measures 1.2 cm greatest dimension, previously approximately 1.2 cm with respect to the largest area but measured 0.9 cm greatest thickness.   Airways are patent. No consolidation or pleural effusion.   Upper Abdomen: Incidentally imaged upper abdominal contents with changes of cholecystectomy. Upper abdominal contents are otherwise unremarkable.   Musculoskeletal: No chest wall lesion. No acute bone process. No destructive bone finding.   IMPRESSION: 1. Irregular area in the right lower lobe with dominant nodule in small group peripheral nodules raises the question of infectious etiology given the slight interval decrease in some dimensions compared to the prior study potential short interval follow-up could be helpful to track for interval decrease. Neoplasm at this point not entirely excluded. 2. Stable mildly enlarged subcarinal and right paratracheal lymph nodes. Below threshold in terms of nodal enlargement for CT. 3. Aortic atherosclerosis.

## 2021-04-27 NOTE — Progress Notes (Signed)
[facility] Note  04/27/2021     CHIEF COMPLAINT Patient presents for Eye Problem   HISTORY OF PRESENT ILLNESS: Sylvia Anthony is a 65 y.o. female who presents to the clinic today for:   HPI   Problem- For about 2 weeks she has had fluctuating vision OS.  Once OS becomes blurry she will use some of her eye drops either Brimonidine or Cosopt which will clear it up.  She stopped using both drops daily after last appt. Denies light sensitivity.  Last edited by Leonie Douglas, [place] on 04/27/2021  8:03 AM.    Pt has been last to follow up since October 2021, pt comes back today for decreased VA OS, pt has not been using her pressure drops, she states last week she had 2 bumps appear around OS   Referring physician: Glenda Chroman, MD [place],   [id number]  HISTORICAL INFORMATION:   Selected notes from the [facility] Referred by [name] for concern of bilateral vitritis.   CURRENT MEDICATIONS: No current outpatient medications on file. (Ophthalmic Drugs)   No current facility-administered medications for this visit. (Ophthalmic Drugs)   Current Outpatient Medications (Other)  Medication Sig   acetaminophen (TYLENOL) 500 MG tablet Take 500 mg by mouth every 6 (six) hours as needed.   albuterol (PROVENTIL HFA;VENTOLIN HFA) 108 (90 BASE) MCG/ACT inhaler Inhale 2 puffs into the lungs every 6 (six) hours as needed for wheezing or shortness of breath.   amLODipine (NORVASC) 5 MG tablet Take 1 tablet (5 mg total) by mouth daily.   aspirin EC 81 MG tablet Take 1 tablet (81 mg total) by mouth daily.   carvedilol (COREG) 6.25 MG tablet Take 1 tablet (6.25 mg total) by mouth 2 (two) times daily.   cetirizine (ZYRTEC) 10 MG tablet Take 10 mg by mouth at bedtime as needed for allergies.    diclofenac (VOLTAREN) 75 MG EC tablet Take 75 mg by mouth daily as needed for mild pain.   levothyroxine (SYNTHROID, LEVOTHROID) 112 MCG tablet Take  112 mcg by mouth daily before breakfast.    meclizine (ANTIVERT) 25 MG tablet Take 1 tablet (25 mg total) by mouth 3 (three) times daily as needed for dizziness.   nitroGLYCERIN (NITROSTAT) 0.4 MG SL tablet Place 1 tablet (0.4 mg total) under the tongue every 5 (five) minutes as needed for chest pain.   pantoprazole (PROTONIX) 40 MG tablet Take 1 tablet (40 mg total) by mouth 2 (two) times daily before a meal.   potassium chloride SA (K-DUR,KLOR-CON) 20 MEQ tablet Take 20 mEq by mouth daily.   rosuvastatin (CRESTOR) 20 MG tablet Take 20 mg by mouth daily.   valsartan-hydrochlorothiazide (DIOVAN-HCT) 320-25 MG per tablet Take 1 tablet by mouth daily.   No current facility-administered medications for this visit. (Other)   REVIEW OF SYSTEMS: ROS   Positive for: Gastrointestinal, Cardiovascular, Eyes Negative for: Constitutional, Neurological, Skin, Genitourinary, Musculoskeletal, HENT, Endocrine, Respiratory, Psychiatric, Allergic/Imm, Heme/Lymph Last edited by Leonie Douglas, [place] on 04/27/2021  8:03 AM.     ALLERGIES Allergies  Allergen Reactions   Gabapentin     SWELLING "OUT OF IT" 12/23/16    PAST MEDICAL HISTORY Past Medical History:  Diagnosis Date   Anemia    Arm numbness    Cataract    Mixed form OU   Fibromyalgia    GERD (gastroesophageal reflux disease)    Glucose intolerance (impaired glucose tolerance)  H. pylori infection 01/2012   Amoxicillin, Biaxin   Hyperlipidemia    Hypertension    Hypertensive retinopathy    OU   Hypothyroidism    Mitral valve prolapse    not seen on recent echoes   Rheumatic fever    age 41, was at [facility] for 17 weeks   SSS (sick sinus syndrome) ([place])    1997 ppm, gen change 2020 (MDT)   Past Surgical History:  Procedure Laterality Date   ABDOMINAL HYSTERECTOMY     partial   APPENDECTOMY     CARDIAC CATHETERIZATION     multiple   CHOLECYSTECTOMY     COLONOSCOPY  08/01/2006   3 mm descending colon polyp removed/8 mm  sessile ascending colon polyp removed / 3-mm rectal  polyp removed /Rare sigmoid diverticulosis/ Moderate internal hemorrhoids.Advanced adenoma on colonoscopy in March 2008.  The polyp was anadenomatous polyp with a foci of high-grade dysplasia   COLONOSCOPY  09/08/2008   SIMPLE ADENOMA/HYPERPLASTIC POLY/Multiple colon polyps (ascending, sigmoid, rectal)  Mild sigmoid colon diverticulosis./ Small internal hemorrhoids   COLONOSCOPY N/A 12/11/2017   Dr. Oneida Alar: diverticulosis, hemorrhoids next surveillance tcs 5 years.    COLONOSCOPY WITH ESOPHAGOGASTRODUODENOSCOPY (EGD)  Sept. 30, 2013   CXK:GYJE diverticulosis was noted in the sigmoid colon/The colon was otherwise normal/Small internal hemorrhoids/EGD:The mucosa of the esophagus appeared normal/Non-erosive gastritis (inflammation) was found; multiple bx/The duodenal mucosa showed no abnormalities. +H.pylori gastritis, treated with equivalent of prevpac. SAVARY DILATION   ESOPHAGEAL DILATION N/A 10/07/2014   Procedure: ESOPHAGEAL DILATION;  Surgeon: Danie Binder, MD;  Location: [facility];  Service: Endoscopy;  Laterality: N/A;   ESOPHAGOGASTRODUODENOSCOPY N/A 10/07/2014   Dr. Oneida Alar: moderate non-erosive gastritis, no definite stricture, empiric dilation . negative H.pylori    growth removed from intestine     [facility]-as teenager, done through colonoscopy   [place]  09/18/2006   generator change by Dr Leonia Reeves with a MDT [place]  09/13/1995   for sick sinus syndome and syncope at [place] N/A 06/11/2018    Medtronic Azure XT DR MRI SureScan model [id number] (serial number [id number]) pacemaker by Dr Rayann Heman   right Achilles tendon     X 2   TONSILLECTOMY      FAMILY HISTORY Family History  Problem Relation Age of Onset   Hyperlipidemia Mother    Hypertension Mother    Heart disease Father    Hypertension Father    Hyperlipidemia Sister    Hypertension Sister     Colon polyps Sister    Hyperlipidemia Brother    Hypertension Brother    Colon polyps Brother    Colon cancer Neg Hx     SOCIAL HISTORY Social History   Tobacco Use   Smoking status: Former    Packs/day: 0.25    Years: 4.00    Pack years: 1.00    Types: Cigarettes    Quit date: 10/09/1985    Years since quitting: 35.5   Smokeless tobacco: Never   Tobacco comments:    just in teens  Vaping Use   Vaping Use: Never used  Substance Use Topics   Alcohol use: No    Alcohol/week: 0.0 standard drinks   Drug use: No         OPHTHALMIC EXAM:  Base Eye Exam     Visual Acuity (Snellen - Linear)       Right Left   Dist [place] 20/30  20/40   Dist ph [place] 20/25 -2 NI         Tonometry (Tonopen, 8:15 AM)       Right Left   Pressure 22 20         Pupils       Dark Light Shape React APD   Right 4 3 Round Slow None   Left 4 3 Round Slow None         Visual Fields (Counting fingers)       Left Right    Full Full         Extraocular Movement       Right Left    Full Full         Neuro/Psych     Oriented x3: Yes   Mood/Affect: Normal         Dilation     Both eyes: 1.0% Mydriacyl, 2.5% Phenylephrine @ 8:15 AM           Slit Lamp and Fundus Exam     Slit Lamp Exam       Right Left   Lids/Lashes Mild Dermatochalasis - upper lid Mild Dermatochalasis - upper lid   Conjunctiva/Sclera Nasal Pinguecula, mild Melanosis Nasal Pinguecula, mild Melanosis, white and quiet   Cornea Mild Arcus, 1+ inferior Punctate epithelial erosions Mild Arcus, trace Punctate epithelial erosions   Anterior Chamber deep, narrow temporal angle, no cell/flare deep, narrow temporal angle, No cell or flare   Iris Round and dilated Round and dilated   Lens 2-3+ Nuclear sclerosis, 2-3+ Cortical cataract 2-3+ Nuclear sclerosis, 2-3+ Cortical cataract   Anterior Vitreous Vitreous syneresis Vitreous syneresis, mild cell/pigment         Fundus Exam       Right Left   Disc  Pink and Sharp, +cupping, Thin superior rim Pink and Sharp   C/D Ratio 0.75 0.6   Macula Flat, good foveal reflex, mild RPE mottling and clumping, no heme or edema Flat, good foveal reflex, mild Retinal pigment epithelial mottling, no heme or edema   Vessels mild attenuation, mild tortuousity Vascular attenuation, mild Tortuousity, mild AV crossing changes   Periphery Attached, no heme Attached, no heme, no snowbanking, peripheral pigmented cystoid degeneration inferiory           Refraction     Manifest Refraction       Sphere Cylinder Axis Dist VA   Right -0.25 +0.50 045 20/25   Left -0.25 +0.50 165 20/25-1            IMAGING AND PROCEDURES  Imaging and Procedures for @TODAY @  OCT, Retina - OU - Both Eyes       Right Eye Quality was good. Central Foveal Thickness: 244. Progression has been stable. Findings include no SRF, no IRF, normal foveal contour (No IRF/CME, no vitreous opacities).   Left Eye Quality was good. Central Foveal Thickness: 247. Progression has been stable. Findings include normal foveal contour, no IRF, no SRF (No IRF/CME, no vitreous opacities).   Notes *Images captured and stored on drive  Diagnosis / Impression:  NFP, no IRF/SRF OS No IRF/CME, no vitreous opacities OU  Clinical management:  See below  Abbreviations: NFP - Normal foveal profile. CME - cystoid macular edema. PED - pigment epithelial detachment. IRF - intraretinal fluid. SRF - subretinal fluid. EZ - ellipsoid zone. ERM - epiretinal membrane. ORA - outer retinal atrophy. ORT - outer retinal tubulation. 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 Cystoid macular edema of both eyes  H35.353     3. Retinal edema  H35.81 OCT, Retina - OU - Both Eyes    4. Uveitis  H20.9     5. Essential hypertension  I10     6. Hypertensive retinopathy of both eyes  H35.033     7. Combined forms  of age-related cataract of both eyes  H25.813     8. Nodule of right lung  R91.1     9. Bilateral ocular hypertension  H40.053     10. Glaucoma suspect of both eyes  H40.003      **pt lost to f/u since 10.11.21 (13 mos) -- presents for decreased vision OS**  - no reactivation  - suspect cataract progression +/- development of glaucoma/progression  1-4. Mild Panuveitis w/ CME OU -- improved / stably resolved tapered off drops  - delayed to follow up from 10.11.21 to 11.29.22  - at initial presentation, pt reported 1+ mo history of floaters and decreased vision OU; +photophobia  - saw [name], who noted [facility] cell and started PF q1h  - initial exam here showed +cell/pigment in [facility] and vitreous cavity; mild vitreous haze and +central CME OU  - FA (02.15.21) showed central petaloid hyperfluorescence and hyperfluorescence of the optic disc  - s/p STK OD (05.25.21)  - pt reports history of fibromyalgia, family history of lupus  - initiated uveitis lab work up -- all WNL except +toxo IgG   CBC, CMP   RPR, VDRL, FTA-Abs, MHA   HIV, Lyme, Quant-Gold   Toxoplasma titers   HLA Panel   ANA   ANCA   ACE, Lysozyme   RF   ESR, CRP   CXR  - chest x-ray without TB or pulmonary sarcoidosis, but did reveal a 1.6cm nodular mass in right lung base  - hx of Covid-19 infection ?involvement  - repeat FA (08.23.21) showed interval improvement in perifoveal petaloid leakage OU  - OCT shows OD: stable improvement of IRF/CME; OS: Stable resolution of CME OS  - discussed findings  - completed PF and Prolensa taper   - was supposed to be using Cosopt BID OU, brimonidine TID OU -- pt only using intermittently  - STK informed consent obtained, signed and scanned, 05.25.21  - f/u 6 months -- DFE/OCT  5,6. Hypertensive retinopathy OU  - discussed importance of tight BP control  - monitor  7. Mixed form age related cataract OU  - The symptoms of cataract, surgical options, and treatments and risks were  discussed with patient.  - discussed diagnosis and progression  - will refer to Dr. Lucianne Lei for cataract / glaucoma eval  8. 1.6 cm lung nodule of R lung base found on CXR  - following with Pulmonology -- lesion shrinking and no interventions have been recommended  - PET imaging done on 3.11.21 -- identifying some metabolic lesions (see report below)  - CT chest performed 4.14.21 -- indicating mild dec in some dimensions (see report below)  - pt saw [name] who had originally scheduled a bronchoscopy, but was cancelled following the CT evidence of decreasing size on CT  9,10. Ocular Hypertension / Glaucoma suspect OU  - IOP 22,20  - +family history  - C/D increased OU from 2021 exams -- OD 0.75; OS 0.6  - reviewed instructions: Cosopt BID OU and brimonidine TID OU -- pt has only  been using drops a couple times a week  - will refer to Dr. Lucianne Lei for glaucoma / cataract eval  Ophthalmic Meds Ordered this visit:  No orders of the defined types were placed in this encounter.  This document serves as a record of services personally performed by Gardiner Sleeper, MD, PhD. It was created on their behalf by San Jetty. Owens Shark, OA an ophthalmic technician. The creation of this record is the provider's dictation and/or activities during the visit.    Electronically signed by: San Jetty. Owens Shark, [place] 11.29.2022 12:16 PM  Gardiner Sleeper, M.D., Ph.D. Diseases & Surgery of the Retina and Vitreous [facility]  I have reviewed the above documentation for accuracy and completeness, and I agree with the above. Gardiner Sleeper, M.D., Ph.D. 04/27/21 12:16 PM    Abbreviations: M myopia (nearsighted); A astigmatism; H hyperopia (farsighted); P presbyopia; Mrx spectacle prescription;  CTL contact lenses; OD right eye; OS left eye; OU both eyes  XT exotropia; ET esotropia; PEK punctate epithelial keratitis; PEE punctate epithelial erosions; DES dry eye syndrome; MGD meibomian gland dysfunction;  ATs artificial tears; PFAT's preservative free artificial tears; Armstrong nuclear sclerotic cataract; PSC posterior subcapsular cataract; ERM epi-retinal membrane; PVD posterior vitreous detachment; RD retinal detachment; DM diabetes mellitus; DR diabetic retinopathy; NPDR non-proliferative diabetic retinopathy; PDR proliferative diabetic retinopathy; CSME clinically significant macular edema; DME diabetic macular edema; dbh dot blot hemorrhages; CWS cotton wool spot; POAG primary open angle glaucoma; C/D cup-to-disc ratio; HVF humphrey visual field; GVF goldmann visual field; OCT optical coherence tomography; IOP intraocular pressure; BRVO Branch retinal vein occlusion; CRVO central retinal vein occlusion; CRAO central retinal artery occlusion; BRAO branch retinal artery occlusion; RT retinal tear; SB scleral buckle; PPV pars plana vitrectomy; VH Vitreous hemorrhage; PRP panretinal laser photocoagulation; IVK intravitreal kenalog; VMT vitreomacular traction; MH Macular hole;  NVD neovascularization of the disc; NVE neovascularization elsewhere; AREDS age related eye disease study; ARMD age related macular degeneration; POAG primary open angle glaucoma; EBMD epithelial/anterior basement membrane dystrophy; ACIOL anterior chamber intraocular lens; IOL intraocular lens; PCIOL posterior chamber intraocular lens; Phaco/IOL phacoemulsification with intraocular lens placement; PRK photorefractive keratectomy; LASIK laser assisted in situ keratomileusis; HTN hypertension; DM diabetes mellitus; COPD chronic obstructive pulmonary disease   IMAGING:  NM PET Image (3.11.21)  FINDINGS: Mediastinal blood pool activity: SUV max 2.5   Liver activity: SUV max 3.5   NECK: No hypermetabolic lymph nodes in the neck.   Incidental CT findings: none   CHEST: Within the RIGHT lower lobe, tight cluster of nodules measures 1.4 x 1.3 cm (image 77/4) has mild to moderate metabolic activity for size (SUV max equal 2.9).   There  is hypermetabolic activity associated the subcarinal lymph node which is normal size at 9 mm but fairly intense metabolic activity SUV max equal 5.0. More mild activity associated with a partially calcified small RIGHT hilar node with SUV max equal 3.8.   Incidental CT findings: none   ABDOMEN/PELVIS: No abnormal hypermetabolic activity within the liver, pancreas, adrenal glands, or spleen. No hypermetabolic lymph nodes in the abdomen or pelvis.   Incidental CT findings: none   SKELETON: No focal hypermetabolic activity to suggest skeletal metastasis.   Incidental CT findings: none   IMPRESSION: 1. Mild-to-moderate metabolic activity associated with irregular RIGHT lobe pulmonary nodule. Hypermetabolic subcarinal lymph node. Findings remain indeterminate for inflammatory nodule and lymphadenopathy versus malignancy. There is evidence of granulomatous disease with calcified RIGHT hilar lymph node. Recommend either short-term CT  follow-up (3 months) or tissue sampling. 2. No distant metastatic disease.  CT Chest w/o contrast (4.13.21)  FINDINGS: Cardiovascular: Right-sided dual lead pacer device in place, power pack over the left chest. Leads appear in similar position to the prior exam. Heart size mildly enlarged. No pericardial effusion. The calcified atherosclerotic changes of the thoracic aorta without aneurysmal dilation. Main pulmonary artery is mildly dilated.   Mediastinum/Nodes: Stable mildly enlarged subcarinal and right paratracheal lymph nodes just at or less than a cm. No hilar adenopathy. No axillary adenopathy. Thoracic inlet structures are normal.   Lungs/Pleura: Irregular area in the right lower lobe anteriorly, in total measuring approximately 1.7 cm by 1.2 cm. Grouped nodules at the periphery. The dominant area measuring approximately 1.3 cm greatest dimension with extension to the pleural surface along the right lateral chest. The lesion abuts the major  fissure. On the sagittal and coronal images the lesion appears to have decreased in size compared to the prior study, dominant area measuring 6-7 mm greatest thickness on sagittal image 44 of series 6 as compared to 9-10 mm on the previous study, image 36 of series 7 on the 07/22/2018 exam.   Axial measurements are provided across the greatest extent of the lesion which appears to be less dense in the axial plane within the central portion of the measured area across grouped nodules at the periphery and the dominant nodule more centrally.   On coronal image 78 of series 5 the lesion measures 1.2 cm greatest dimension, previously approximately 1.2 cm with respect to the largest area but measured 0.9 cm greatest thickness.   Airways are patent. No consolidation or pleural effusion.   Upper Abdomen: Incidentally imaged upper abdominal contents with changes of cholecystectomy. Upper abdominal contents are otherwise unremarkable.   Musculoskeletal: No chest wall lesion. No acute bone process. No destructive bone finding.   IMPRESSION: 1. Irregular area in the right lower lobe with dominant nodule in small group peripheral nodules raises the question of infectious etiology given the slight interval decrease in some dimensions compared to the prior study potential short interval follow-up could be helpful to track for interval decrease. Neoplasm at this point not entirely excluded. 2. Stable mildly enlarged subcarinal and right paratracheal lymph nodes. Below threshold in terms of nodal enlargement for CT. 3. Aortic atherosclerosis.

## 2021-04-28 DIAGNOSIS — I10 Essential (primary) hypertension: Secondary | ICD-10-CM | POA: Diagnosis not present

## 2021-04-29 DIAGNOSIS — S335XXA Sprain of ligaments of lumbar spine, initial encounter: Secondary | ICD-10-CM | POA: Diagnosis not present

## 2021-04-29 DIAGNOSIS — M9903 Segmental and somatic dysfunction of lumbar region: Secondary | ICD-10-CM | POA: Diagnosis not present

## 2021-05-25 DIAGNOSIS — S335XXA Sprain of ligaments of lumbar spine, initial encounter: Secondary | ICD-10-CM | POA: Diagnosis not present

## 2021-05-25 DIAGNOSIS — M9903 Segmental and somatic dysfunction of lumbar region: Secondary | ICD-10-CM | POA: Diagnosis not present

## 2021-05-26 DIAGNOSIS — S335XXA Sprain of ligaments of lumbar spine, initial encounter: Secondary | ICD-10-CM | POA: Diagnosis not present

## 2021-05-26 DIAGNOSIS — M9903 Segmental and somatic dysfunction of lumbar region: Secondary | ICD-10-CM | POA: Diagnosis not present

## 2021-05-28 DIAGNOSIS — Z299 Encounter for prophylactic measures, unspecified: Secondary | ICD-10-CM | POA: Diagnosis not present

## 2021-05-28 DIAGNOSIS — E039 Hypothyroidism, unspecified: Secondary | ICD-10-CM | POA: Diagnosis not present

## 2021-05-28 DIAGNOSIS — E7800 Pure hypercholesterolemia, unspecified: Secondary | ICD-10-CM | POA: Diagnosis not present

## 2021-05-28 DIAGNOSIS — I10 Essential (primary) hypertension: Secondary | ICD-10-CM | POA: Diagnosis not present

## 2021-05-28 DIAGNOSIS — K219 Gastro-esophageal reflux disease without esophagitis: Secondary | ICD-10-CM | POA: Diagnosis not present

## 2021-06-01 DIAGNOSIS — M9903 Segmental and somatic dysfunction of lumbar region: Secondary | ICD-10-CM | POA: Diagnosis not present

## 2021-06-01 DIAGNOSIS — S335XXA Sprain of ligaments of lumbar spine, initial encounter: Secondary | ICD-10-CM | POA: Diagnosis not present

## 2021-06-01 DIAGNOSIS — M9901 Segmental and somatic dysfunction of cervical region: Secondary | ICD-10-CM | POA: Diagnosis not present

## 2021-06-01 DIAGNOSIS — M546 Pain in thoracic spine: Secondary | ICD-10-CM | POA: Diagnosis not present

## 2021-06-01 DIAGNOSIS — H401134 Primary open-angle glaucoma, bilateral, indeterminate stage: Secondary | ICD-10-CM | POA: Diagnosis not present

## 2021-06-01 DIAGNOSIS — H25813 Combined forms of age-related cataract, bilateral: Secondary | ICD-10-CM | POA: Diagnosis not present

## 2021-06-01 DIAGNOSIS — M9902 Segmental and somatic dysfunction of thoracic region: Secondary | ICD-10-CM | POA: Diagnosis not present

## 2021-06-01 DIAGNOSIS — H35033 Hypertensive retinopathy, bilateral: Secondary | ICD-10-CM | POA: Diagnosis not present

## 2021-06-01 DIAGNOSIS — M7502 Adhesive capsulitis of left shoulder: Secondary | ICD-10-CM | POA: Diagnosis not present

## 2021-06-01 DIAGNOSIS — S134XXA Sprain of ligaments of cervical spine, initial encounter: Secondary | ICD-10-CM | POA: Diagnosis not present

## 2021-06-03 DIAGNOSIS — M7502 Adhesive capsulitis of left shoulder: Secondary | ICD-10-CM | POA: Diagnosis not present

## 2021-06-03 DIAGNOSIS — S134XXA Sprain of ligaments of cervical spine, initial encounter: Secondary | ICD-10-CM | POA: Diagnosis not present

## 2021-06-03 DIAGNOSIS — M9903 Segmental and somatic dysfunction of lumbar region: Secondary | ICD-10-CM | POA: Diagnosis not present

## 2021-06-03 DIAGNOSIS — M9902 Segmental and somatic dysfunction of thoracic region: Secondary | ICD-10-CM | POA: Diagnosis not present

## 2021-06-03 DIAGNOSIS — M546 Pain in thoracic spine: Secondary | ICD-10-CM | POA: Diagnosis not present

## 2021-06-03 DIAGNOSIS — S335XXA Sprain of ligaments of lumbar spine, initial encounter: Secondary | ICD-10-CM | POA: Diagnosis not present

## 2021-06-03 DIAGNOSIS — M9901 Segmental and somatic dysfunction of cervical region: Secondary | ICD-10-CM | POA: Diagnosis not present

## 2021-06-07 DIAGNOSIS — S335XXA Sprain of ligaments of lumbar spine, initial encounter: Secondary | ICD-10-CM | POA: Diagnosis not present

## 2021-06-07 DIAGNOSIS — M9903 Segmental and somatic dysfunction of lumbar region: Secondary | ICD-10-CM | POA: Diagnosis not present

## 2021-06-09 ENCOUNTER — Ambulatory Visit (INDEPENDENT_AMBULATORY_CARE_PROVIDER_SITE_OTHER): Payer: Medicare Other

## 2021-06-09 DIAGNOSIS — I495 Sick sinus syndrome: Secondary | ICD-10-CM | POA: Diagnosis not present

## 2021-06-09 LAB — CUP PACEART REMOTE DEVICE CHECK
Battery Remaining Longevity: 136 mo
Battery Voltage: 3.02 V
Brady Statistic AP VP Percent: 0.04 %
Brady Statistic AP VS Percent: 90.28 %
Brady Statistic AS VP Percent: 0 %
Brady Statistic AS VS Percent: 9.68 %
Brady Statistic RA Percent Paced: 90.53 %
Brady Statistic RV Percent Paced: 0.04 %
Date Time Interrogation Session: 20230110222634
Implantable Lead Implant Date: 19970416
Implantable Lead Implant Date: 19970416
Implantable Lead Location: 753859
Implantable Lead Location: 753860
Implantable Lead Model: 5034
Implantable Lead Model: 5534
Implantable Pulse Generator Implant Date: 20200113
Lead Channel Impedance Value: 646 Ohm
Lead Channel Impedance Value: 684 Ohm
Lead Channel Impedance Value: 722 Ohm
Lead Channel Impedance Value: 741 Ohm
Lead Channel Pacing Threshold Amplitude: 0.75 V
Lead Channel Pacing Threshold Amplitude: 0.875 V
Lead Channel Pacing Threshold Pulse Width: 0.4 ms
Lead Channel Pacing Threshold Pulse Width: 0.4 ms
Lead Channel Sensing Intrinsic Amplitude: 1.875 mV
Lead Channel Sensing Intrinsic Amplitude: 1.875 mV
Lead Channel Sensing Intrinsic Amplitude: 19.125 mV
Lead Channel Sensing Intrinsic Amplitude: 19.125 mV
Lead Channel Setting Pacing Amplitude: 1.5 V
Lead Channel Setting Pacing Amplitude: 2.5 V
Lead Channel Setting Pacing Pulse Width: 0.4 ms
Lead Channel Setting Sensing Sensitivity: 2 mV

## 2021-06-15 DIAGNOSIS — S335XXA Sprain of ligaments of lumbar spine, initial encounter: Secondary | ICD-10-CM | POA: Diagnosis not present

## 2021-06-15 DIAGNOSIS — M9903 Segmental and somatic dysfunction of lumbar region: Secondary | ICD-10-CM | POA: Diagnosis not present

## 2021-06-18 NOTE — Progress Notes (Signed)
Remote pacemaker transmission.   

## 2021-06-21 DIAGNOSIS — I739 Peripheral vascular disease, unspecified: Secondary | ICD-10-CM | POA: Diagnosis not present

## 2021-06-21 DIAGNOSIS — E039 Hypothyroidism, unspecified: Secondary | ICD-10-CM | POA: Diagnosis not present

## 2021-06-21 DIAGNOSIS — I700 Atherosclerosis of aorta: Secondary | ICD-10-CM | POA: Diagnosis not present

## 2021-06-21 DIAGNOSIS — I10 Essential (primary) hypertension: Secondary | ICD-10-CM | POA: Diagnosis not present

## 2021-06-21 DIAGNOSIS — Z299 Encounter for prophylactic measures, unspecified: Secondary | ICD-10-CM | POA: Diagnosis not present

## 2021-06-27 DIAGNOSIS — I10 Essential (primary) hypertension: Secondary | ICD-10-CM | POA: Diagnosis not present

## 2021-06-28 DIAGNOSIS — H35353 Cystoid macular degeneration, bilateral: Secondary | ICD-10-CM | POA: Diagnosis not present

## 2021-06-28 DIAGNOSIS — H2512 Age-related nuclear cataract, left eye: Secondary | ICD-10-CM | POA: Diagnosis not present

## 2021-06-28 DIAGNOSIS — H25813 Combined forms of age-related cataract, bilateral: Secondary | ICD-10-CM | POA: Diagnosis not present

## 2021-06-28 DIAGNOSIS — H401134 Primary open-angle glaucoma, bilateral, indeterminate stage: Secondary | ICD-10-CM | POA: Diagnosis not present

## 2021-06-30 ENCOUNTER — Ambulatory Visit: Payer: Medicare Other | Admitting: Interventional Cardiology

## 2021-07-10 NOTE — Progress Notes (Signed)
Cardiology Office Note:    Date:  07/12/2021   ID:  Sylvia GaribaldiLiaretta B Rivenbark, DOB Nov 20, 1955, MRN [id number]  PCP:  Ignatius SpeckingVyas, Dhruv B, MD  Cardiologist:  Lesleigh NoeHenry W Yeraldine Forney III, MD   Referring MD: Ignatius SpeckingVyas, Dhruv B, MD   Chief Complaint  Patient presents with   Hypertension   Hyperlipidemia    History of Present Illness:    Sylvia Anthony is a 66 y.o. female with a hx of a hx of SSS, PPM, hypertension, CVA, and chronic dyspnea.  No complaints.  Has prior history of CVA.  Laboratory reported on the [facility] documents an October 2022 LDL of greater than 170.  Her LDL prior to that was 86 in June.  Not sure how to evaluate this data.  She is on rosuvastatin 20 mg/day.  Perhaps this laboratory data is in our.  She does not feel that she was off the medication at the time that the October blood work was done by Dr. Sherril CroonVyas.  Denies angina, palpitations, neurological symptoms, and shortness of breath.  Past Medical History:  Diagnosis Date   Anemia    Arm numbness    Cataract    Mixed form OU   Fibromyalgia    GERD (gastroesophageal reflux disease)    Glucose intolerance (impaired glucose tolerance)    H. pylori infection 01/2012   Amoxicillin, Biaxin   Hyperlipidemia    Hypertension    Hypertensive retinopathy    OU   Hypothyroidism    Mitral valve prolapse    not seen on recent echoes   Rheumatic fever    age 89, [facility]   SSS (sick sinus syndrome) (HCC)    1997 ppm, gen change 2020 (MDT)    Past Surgical History:  Procedure Laterality Date   ABDOMINAL HYSTERECTOMY     partial   APPENDECTOMY     CARDIAC CATHETERIZATION     multiple   CHOLECYSTECTOMY     COLONOSCOPY  08/01/2006   3 mm descending colon polyp removed/8 mm sessile ascending colon polyp removed / 3-mm rectal  polyp removed /Rare sigmoid diverticulosis/ Moderate internal hemorrhoids.Advanced adenoma on colonoscopy in March 2008.  The polyp was anadenomatous polyp with a foci of high-grade  dysplasia   COLONOSCOPY  09/08/2008   SIMPLE ADENOMA/HYPERPLASTIC POLY/Multiple colon polyps (ascending, sigmoid, rectal)  Mild sigmoid colon diverticulosis./ Small internal hemorrhoids   COLONOSCOPY N/A 12/11/2017   Dr. Darrick PennaFields: diverticulosis, hemorrhoids next surveillance tcs 5 years.    COLONOSCOPY WITH ESOPHAGOGASTRODUODENOSCOPY (EGD)  Sept. 30, 2013   YQM:VHQISLF:Mild diverticulosis was noted in the sigmoid colon/The colon was otherwise normal/Small internal hemorrhoids/EGD:The mucosa of the esophagus appeared normal/Non-erosive gastritis (inflammation) was found; multiple bx/The duodenal mucosa showed no abnormalities. +H.pylori gastritis, treated with equivalent of prevpac. SAVARY DILATION   ESOPHAGEAL DILATION N/A 10/07/2014   Procedure: ESOPHAGEAL DILATION;  Surgeon: West BaliSandi L Fields, MD;  [facility];  Service: Endoscopy;  Laterality: N/A;   ESOPHAGOGASTRODUODENOSCOPY N/A 10/07/2014   Dr. Darrick PennaFields: moderate non-erosive gastritis, no definite stricture, empiric dilation . negative H.pylori    growth removed from intestine     [facility]-as teenager, done through colonoscopy   PACEMAKER GENERATOR CHANGE  09/18/2006   generator change by Dr Amil AmenEdmunds with a MDT Adapta L   PACEMAKER INSERTION  09/13/1995   for sick sinus syndome and [facility]   PPM GENERATOR CHANGEOUT N/A 06/11/2018    Medtronic Azure XT DR MRI SureScan model [id number] (serial  number [id number]) pacemaker by Dr Johney Frame   right Achilles tendon     X 2   TONSILLECTOMY      Current Medications: Current Meds  Medication Sig   albuterol (PROVENTIL HFA;VENTOLIN HFA) 108 (90 BASE) MCG/ACT inhaler Inhale 2 puffs into the lungs every 6 (six) hours as needed for wheezing or shortness of breath.   aspirin EC 81 MG tablet Take 1 tablet (81 mg total) by mouth daily.   brimonidine (ALPHAGAN) 0.2 % ophthalmic solution 1 drop 3 (three) times daily.   carvedilol (COREG) 6.25 MG tablet Take 1 tablet (6.25 mg total) by mouth 2  (two) times daily.   cetirizine (ZYRTEC) 10 MG tablet Take 10 mg by mouth at bedtime as needed for allergies.    COSOPT 22.3-6.8 MG/ML ophthalmic solution Place 1 drop into both eyes 2 (two) times daily.   diclofenac (VOLTAREN) 75 MG EC tablet Take 75 mg by mouth daily as needed for mild pain.   ketorolac (ACULAR) 0.5 % ophthalmic solution Place 1 drop into the left eye 3 (three) times daily.   levothyroxine (SYNTHROID, LEVOTHROID) 112 MCG tablet Take 112 mcg by mouth daily before breakfast.    meclizine (ANTIVERT) 25 MG tablet Take 1 tablet (25 mg total) by mouth 3 (three) times daily as needed for dizziness.   nitroGLYCERIN (NITROSTAT) 0.4 MG SL tablet Place 1 tablet (0.4 mg total) under the tongue every 5 (five) minutes as needed for chest pain.   pantoprazole (PROTONIX) 40 MG tablet Take 1 tablet (40 mg total) by mouth 2 (two) times daily before a meal.   potassium chloride SA (K-DUR,KLOR-CON) 20 MEQ tablet Take 20 mEq by mouth daily.   rosuvastatin (CRESTOR) 20 MG tablet Take 20 mg by mouth daily.   valsartan-hydrochlorothiazide (DIOVAN-HCT) 320-25 MG per tablet Take 1 tablet by mouth daily.     Allergies:   Gabapentin   Social History   Socioeconomic History   Marital status: Married    Spouse name: Not on file   Number of children: 1   Years of education: Not on file   Highest education level: Not on file  Occupational History   Occupation: part-time hairdresser  Tobacco Use   Smoking status: Former    Packs/day: 0.25    Years: 4.00    Pack years: 1.00    Types: Cigarettes    Quit date: 10/09/1985    Years since quitting: 35.7   Smokeless tobacco: Never   Tobacco comments:    just in teens  Vaping Use   Vaping Use: Never used  Substance and Sexual Activity   Alcohol use: No    Alcohol/week: 0.0 standard drinks   Drug use: No   Sexual activity: Not on file  Other Topics Concern   Not on file  Social History Narrative   Lives in [place] with spouse.  Son is healthy at  age 3.   Disabled         Social Determinants of Health   Financial Resource Strain: Not on file  Food Insecurity: Not on file  Transportation Needs: Not on file  Physical Activity: Not on file  Stress: Not on file  Social Connections: Not on file     Family History: The patient's family history includes Colon polyps in her brother and sister; Heart disease in her father; Hyperlipidemia in her brother, mother, and sister; Hypertension in her brother, father, mother, and sister. There is no history of Colon cancer.  ROS:   Please see  the history of present illness.    Surprised that the lipid panel was not in good shape.  Otherwise no issues.  All other systems reviewed and are negative.  EKGs/Labs/Other Studies Reviewed:    The following studies were reviewed today:  2D Doppler Echocardiogram 12/14/2020: IMPRESSIONS   1. Left ventricular ejection fraction, by estimation, is 65 to 70%. Left  ventricular ejection fraction by 3D volume is 70 %. The left ventricle has  normal function. The left ventricle has no regional wall motion  abnormalities. There is mild concentric  left ventricular hypertrophy. Left ventricular diastolic parameters are  consistent with Grade I diastolic dysfunction (impaired relaxation). The  average left ventricular global longitudinal strain is -23.7 %. The global  longitudinal strain is normal.   2. Right ventricular systolic function is normal. The right ventricular  size is normal.   3. The mitral valve is abnormal. Trivial mitral valve regurgitation. The  mean mitral valve gradient is 2.0 mmHg with average heart rate of 63 bpm.  Moderate mitral annular calcification.   4. The aortic valve is tricuspid. There is mild calcification of the  aortic valve. There is mild thickening of the aortic valve. Aortic valve  regurgitation is not visualized. No aortic stenosis is present.   5. The inferior vena cava is normal in size with greater than 50%   respiratory variability, suggesting right atrial pressure of 3 mmHg.   Comparison(s): A prior study was performed on 09/22/2014. Prior images  unable to be directly viewed, comparison made by report only. Similar  function form prior.   EKG:  EKG not performed  Recent Labs: 01/05/2021: BUN 5; Creatinine, Ser 0.83; Potassium 3.9; Sodium 143; TSH 1.070  Recent Lipid Panel No results found for: CHOL, TRIG, HDL, CHOLHDL, VLDL, LDLCALC, LDLDIRECT  Physical Exam:    VS:  BP 112/68    Pulse 61    Ht 5\' 4"  (1.626 m)    Wt 198 lb 9.6 oz (90.1 kg)    SpO2 97%    BMI 34.09 kg/m     Wt Readings from Last 3 Encounters:  07/12/21 198 lb 9.6 oz (90.1 kg)  02/23/21 194 lb (88 kg)  01/05/21 196 lb 9.6 oz (89.2 kg)     GEN: Healthy appearing. No acute distress HEENT: Normal NECK: No JVD. LYMPHATICS: No lymphadenopathy CARDIAC: No murmur. RRR no gallop, or edema. VASCULAR:  Normal Pulses. No bruits. RESPIRATORY:  Clear to auscultation without rales, wheezing or rhonchi  ABDOMEN: Soft, non-tender, non-distended, No pulsatile mass, MUSCULOSKELETAL: No deformity  SKIN: Warm and dry NEUROLOGIC:  Alert and oriented x 3 PSYCHIATRIC:  Normal affect   ASSESSMENT:    1. Essential hypertension   2. Cerebrovascular accident (CVA) due to thrombosis of precerebral artery (HCC)   3. Sick sinus syndrome (HCC)   4. Dyspnea on exertion   5. Hyperlipidemia with target LDL less than 70    PLAN:    In order of problems listed above:  Excellent blood pressure control.  Continue low-salt diet.  Continue carvedilol 6.25 mg twice daily, amlodipine 5 mg/day, Diovan HCT 320/25 mg/day. No neurological symptoms.  If cholesterol is really as high as noted, will need to have further management perhaps by increasing rosuvastatin to 40 mg/day and may be even considering PCSK9.  We will verify the most recent blood work. She will be assigned a new electrophysiologist for pacemaker follow-up in the absence of Dr.  Johney Frame from our practice. Possibly diastolic dysfunction. Consider SGLT-2.  She  has not complained of dyspnea. Continue rosuvastatin 20 mg/day.  Repeat the lipid panel and if LDL is still significantly elevated above 70, additional management will be necessary and may benefit from PCSK9.   Medication Adjustments/Labs and Tests Ordered: Current medicines are reviewed at length with the patient today.  Concerns regarding medicines are outlined above.  No orders of the defined types were placed in this encounter.  No orders of the defined types were placed in this encounter.   Patient Instructions  Medication Instructions:  Your physician recommends that you continue on your current medications as directed. Please refer to the Current Medication list given to you today.  *If you need a refill on your cardiac medications before your next appointment, please call your pharmacy*   Lab Work: None If you have labs (blood work) drawn today and your tests are completely normal, you will receive your results only by: MyChart Message (if you have MyChart) OR A paper copy in the mail If you have any lab test that is abnormal or we need to change your treatment, we will call you to review the results.   Testing/Procedures: None   Follow-Up: At [facility], you and your health needs are our priority.  As part of our continuing mission to provide you with exceptional heart care, we have created designated Provider Care Teams.  These Care Teams include your primary Cardiologist (physician) and Advanced Practice Providers (APPs -  Physician Assistants and Nurse Practitioners) who all work together to provide you with the care you need, when you need it.  We recommend signing up for the patient portal called "MyChart".  Sign up information is provided on this After Visit Summary.  MyChart is used to connect with patients for Virtual Visits (Telemedicine).  Patients are able to view lab/test  results, encounter notes, upcoming appointments, etc.  Non-urgent messages can be sent to your provider as well.   To learn more about what you can do with MyChart, go to [email or web address].    Your next appointment:   1 year(s)  The format for your next appointment:   In Person  Provider:   Lesleigh Noe, MD     Other Instructions     Signed, Lesleigh Noe, MD  07/12/2021 8:51 AM    [facility]

## 2021-07-12 ENCOUNTER — Encounter: Payer: Self-pay | Admitting: Interventional Cardiology

## 2021-07-12 ENCOUNTER — Ambulatory Visit: Payer: Medicare Other | Admitting: Interventional Cardiology

## 2021-07-12 ENCOUNTER — Other Ambulatory Visit: Payer: Self-pay

## 2021-07-12 VITALS — BP 112/68 | HR 61 | Ht 64.0 in | Wt 198.6 lb

## 2021-07-12 DIAGNOSIS — I10 Essential (primary) hypertension: Secondary | ICD-10-CM

## 2021-07-12 DIAGNOSIS — E785 Hyperlipidemia, unspecified: Secondary | ICD-10-CM

## 2021-07-12 DIAGNOSIS — Z299 Encounter for prophylactic measures, unspecified: Secondary | ICD-10-CM | POA: Diagnosis not present

## 2021-07-12 DIAGNOSIS — I495 Sick sinus syndrome: Secondary | ICD-10-CM

## 2021-07-12 DIAGNOSIS — E7800 Pure hypercholesterolemia, unspecified: Secondary | ICD-10-CM | POA: Diagnosis not present

## 2021-07-12 DIAGNOSIS — I6300 Cerebral infarction due to thrombosis of unspecified precerebral artery: Secondary | ICD-10-CM | POA: Diagnosis not present

## 2021-07-12 DIAGNOSIS — R0609 Other forms of dyspnea: Secondary | ICD-10-CM | POA: Diagnosis not present

## 2021-07-12 DIAGNOSIS — R911 Solitary pulmonary nodule: Secondary | ICD-10-CM | POA: Diagnosis not present

## 2021-07-12 NOTE — Patient Instructions (Signed)

## 2021-07-13 ENCOUNTER — Telehealth: Payer: Self-pay | Admitting: Interventional Cardiology

## 2021-07-13 NOTE — Telephone Encounter (Signed)
Pt wanted to let Dr Tamala Julian know that cholesterol med has been increased  and will recheck cholesterol in April ./cy

## 2021-07-13 NOTE — Telephone Encounter (Signed)
Patient called to give information over to Dr. Katrinka Blazing and nurse. Please call back

## 2021-07-26 DIAGNOSIS — Z7189 Other specified counseling: Secondary | ICD-10-CM | POA: Diagnosis not present

## 2021-07-26 DIAGNOSIS — E039 Hypothyroidism, unspecified: Secondary | ICD-10-CM | POA: Diagnosis not present

## 2021-07-26 DIAGNOSIS — Z299 Encounter for prophylactic measures, unspecified: Secondary | ICD-10-CM | POA: Diagnosis not present

## 2021-07-26 DIAGNOSIS — Z0000 Encounter for general adult medical examination without abnormal findings: Secondary | ICD-10-CM | POA: Diagnosis not present

## 2021-07-26 DIAGNOSIS — I10 Essential (primary) hypertension: Secondary | ICD-10-CM | POA: Diagnosis not present

## 2021-07-27 DIAGNOSIS — I10 Essential (primary) hypertension: Secondary | ICD-10-CM | POA: Diagnosis not present

## 2021-08-11 DIAGNOSIS — H268 Other specified cataract: Secondary | ICD-10-CM | POA: Diagnosis not present

## 2021-08-11 DIAGNOSIS — H25812 Combined forms of age-related cataract, left eye: Secondary | ICD-10-CM | POA: Diagnosis not present

## 2021-08-25 DIAGNOSIS — R921 Mammographic calcification found on diagnostic imaging of breast: Secondary | ICD-10-CM | POA: Diagnosis not present

## 2021-08-25 DIAGNOSIS — R922 Inconclusive mammogram: Secondary | ICD-10-CM | POA: Diagnosis not present

## 2021-08-26 DIAGNOSIS — I10 Essential (primary) hypertension: Secondary | ICD-10-CM | POA: Diagnosis not present

## 2021-08-30 DIAGNOSIS — E039 Hypothyroidism, unspecified: Secondary | ICD-10-CM | POA: Diagnosis not present

## 2021-08-30 DIAGNOSIS — I10 Essential (primary) hypertension: Secondary | ICD-10-CM | POA: Diagnosis not present

## 2021-08-30 DIAGNOSIS — R5383 Other fatigue: Secondary | ICD-10-CM | POA: Diagnosis not present

## 2021-08-30 DIAGNOSIS — E7800 Pure hypercholesterolemia, unspecified: Secondary | ICD-10-CM | POA: Diagnosis not present

## 2021-08-30 DIAGNOSIS — Z79899 Other long term (current) drug therapy: Secondary | ICD-10-CM | POA: Diagnosis not present

## 2021-08-30 DIAGNOSIS — Z299 Encounter for prophylactic measures, unspecified: Secondary | ICD-10-CM | POA: Diagnosis not present

## 2021-08-30 DIAGNOSIS — Z0000 Encounter for general adult medical examination without abnormal findings: Secondary | ICD-10-CM | POA: Diagnosis not present

## 2021-09-08 ENCOUNTER — Ambulatory Visit (INDEPENDENT_AMBULATORY_CARE_PROVIDER_SITE_OTHER): Payer: Medicare Other

## 2021-09-08 DIAGNOSIS — I495 Sick sinus syndrome: Secondary | ICD-10-CM | POA: Diagnosis not present

## 2021-09-08 LAB — CUP PACEART REMOTE DEVICE CHECK
Battery Remaining Longevity: 134 mo
Battery Voltage: 3.02 V
Brady Statistic AP VP Percent: 0.05 %
Brady Statistic AP VS Percent: 91.14 %
Brady Statistic AS VP Percent: 0 %
Brady Statistic AS VS Percent: 8.81 %
Brady Statistic RA Percent Paced: 91.47 %
Brady Statistic RV Percent Paced: 0.05 %
Date Time Interrogation Session: 20230411223026
Implantable Lead Implant Date: 19970416
Implantable Lead Implant Date: 19970416
Implantable Lead Location: 753859
Implantable Lead Location: 753860
Implantable Lead Model: 5034
Implantable Lead Model: 5534
Implantable Pulse Generator Implant Date: 20200113
Lead Channel Impedance Value: 646 Ohm
Lead Channel Impedance Value: 722 Ohm
Lead Channel Impedance Value: 760 Ohm
Lead Channel Impedance Value: 798 Ohm
Lead Channel Pacing Threshold Amplitude: 0.75 V
Lead Channel Pacing Threshold Amplitude: 0.75 V
Lead Channel Pacing Threshold Pulse Width: 0.4 ms
Lead Channel Pacing Threshold Pulse Width: 0.4 ms
Lead Channel Sensing Intrinsic Amplitude: 1.875 mV
Lead Channel Sensing Intrinsic Amplitude: 1.875 mV
Lead Channel Sensing Intrinsic Amplitude: 17 mV
Lead Channel Sensing Intrinsic Amplitude: 17 mV
Lead Channel Setting Pacing Amplitude: 1.5 V
Lead Channel Setting Pacing Amplitude: 2.5 V
Lead Channel Setting Pacing Pulse Width: 0.4 ms
Lead Channel Setting Sensing Sensitivity: 2 mV

## 2021-09-21 DIAGNOSIS — H25811 Combined forms of age-related cataract, right eye: Secondary | ICD-10-CM | POA: Diagnosis not present

## 2021-09-22 ENCOUNTER — Encounter: Payer: Self-pay | Admitting: *Deleted

## 2021-09-22 ENCOUNTER — Encounter: Payer: Self-pay | Admitting: Internal Medicine

## 2021-09-22 ENCOUNTER — Telehealth: Payer: Self-pay | Admitting: *Deleted

## 2021-09-22 ENCOUNTER — Ambulatory Visit: Payer: Medicare Other | Admitting: Internal Medicine

## 2021-09-22 VITALS — BP 118/60 | HR 84 | Temp 97.3°F | Ht 64.0 in | Wt 201.2 lb

## 2021-09-22 DIAGNOSIS — K219 Gastro-esophageal reflux disease without esophagitis: Secondary | ICD-10-CM | POA: Diagnosis not present

## 2021-09-22 DIAGNOSIS — Z860101 Personal history of adenomatous and serrated colon polyps: Secondary | ICD-10-CM

## 2021-09-22 DIAGNOSIS — K5900 Constipation, unspecified: Secondary | ICD-10-CM

## 2021-09-22 DIAGNOSIS — R1319 Other dysphagia: Secondary | ICD-10-CM

## 2021-09-22 DIAGNOSIS — Z86010 Personal history of colonic polyps: Secondary | ICD-10-CM

## 2021-09-22 NOTE — Telephone Encounter (Signed)
PA approved via [facility]. Auth# [id number], DOS: Oct 18, 2021 - Jan 16, 2022 ?

## 2021-09-22 NOTE — Patient Instructions (Signed)
We will schedule you for upper endoscopy to further evaluate your difficulty swallowing and chronic reflux.  I will likely stretch your esophagus depending on findings. ? ?Continue on pantoprazole twice daily.  We may make changes to this depending on endoscopic findings. ? ?You will be due for colonoscopy July 2024. ? ?It was very nice meeting you today. ? ?Dr. Marletta Lor ? ?At [facility] we value your feedback. You may receive a survey about your visit today. Please share your experience as we strive to create trusting relationships with our patients to provide genuine, compassionate, quality care. ? ?We appreciate your understanding and patience as we review any laboratory studies, imaging, and other diagnostic tests that are ordered as we care for you. Our office policy is 5 business days for review of these results, and any emergent or urgent results are addressed in a timely manner for your best interest. If you do not hear from our office in 1 week, please contact us.  ? ?We also encourage the use of MyChart, which contains your medical information for your review as well. If you are not enrolled in this feature, an access code is on this after visit summary for your convenience. Thank you for allowing [place] to be involved in your care. ? ?It was great to see you today!  I hope you have a great rest of your Spring! ? ? ? ?Hennie Duos. Marletta Lor, D.O. ?Gastroenterology and Hepatology ?[facility] ? ?

## 2021-09-22 NOTE — Progress Notes (Signed)
? ? ?Referring Provider: Glenda Chroman, MD ?Primary Care Physician:  Glenda Chroman, MD ?Primary GI:  Dr. Abbey Chatters ? ?Chief Complaint  ?Patient presents with  ? Follow-up  ?  Pt needing to know regarding last colonoscopy. ?Pt having trouble swallowing  ? ? ?HPI:   ?Sylvia Anthony is a 66 y.o. female who presents to clinic today for follow-up visit.  History of chronic GERD relatively well controlled on pantoprazole twice daily.  History of esophageal dysphagia in the past.  Underwent EGD with dilation in 2016.  States this helped her symptoms at the time.  Now having resurgence of esophageal dysphagia.  Feels as though food gets stuck in her substernal region.  Esophageal biopsies at that time showed reflux changes.  Gastric biopsy showed gastric intestinal metaplasia.   ? ?Patient denies any melena hematochezia.  Does chronically take diclofenac as well as ibuprofen as needed for joint pains. ? ?History of adenomatous colon polyps.  Due for colonoscopy recall 2024. ? ?Past Medical History:  ?Diagnosis Date  ? Anemia   ? Arm numbness   ? Cataract   ? Mixed form OU  ? Fibromyalgia   ? GERD (gastroesophageal reflux disease)   ? Glucose intolerance (impaired glucose tolerance)   ? H. pylori infection 01/2012  ? Amoxicillin, Biaxin  ? Hyperlipidemia   ? Hypertension   ? Hypertensive retinopathy   ? OU  ? Hypothyroidism   ? Mitral valve prolapse   ? not seen on recent echoes  ? Rheumatic fever   ? age 50, was at [facility] for 17 weeks  ? SSS (sick sinus syndrome) ([place])   ? 1997 ppm, gen change 2020 (MDT)  ? ? ?Past Surgical History:  ?Procedure Laterality Date  ? ABDOMINAL HYSTERECTOMY    ? partial  ? APPENDECTOMY    ? CARDIAC CATHETERIZATION    ? multiple  ? CHOLECYSTECTOMY    ? COLONOSCOPY  08/01/2006  ? 3 mm descending colon polyp removed/8 mm sessile ascending colon polyp removed / 3-mm rectal  polyp removed /Rare sigmoid diverticulosis/ Moderate internal hemorrhoids.Advanced adenoma on colonoscopy in March  2008.  The polyp was anadenomatous polyp with a foci of high-grade dysplasia  ? COLONOSCOPY  09/08/2008  ? SIMPLE ADENOMA/HYPERPLASTIC POLY/Multiple colon polyps (ascending, sigmoid, rectal)  Mild sigmoid colon diverticulosis./ Small internal hemorrhoids  ? COLONOSCOPY N/A 12/11/2017  ? Dr. Oneida Alar: diverticulosis, hemorrhoids next surveillance tcs 5 years.   ? COLONOSCOPY WITH ESOPHAGOGASTRODUODENOSCOPY (EGD)  Sept. 30, 2013  ? [id number] diverticulosis was noted in the sigmoid colon/The colon was otherwise normal/Small internal hemorrhoids/EGD:The mucosa of the esophagus appeared normal/Non-erosive gastritis (inflammation) was found; multiple bx/The duodenal mucosa showed no abnormalities. +H.pylori gastritis, treated with equivalent of prevpac. SAVARY DILATION  ? ESOPHAGEAL DILATION N/A 10/07/2014  ? Procedure: ESOPHAGEAL DILATION;  Surgeon: Danie Binder, MD;  [facility];  Service: Endoscopy;  Laterality: N/A;  ? ESOPHAGOGASTRODUODENOSCOPY N/A 10/07/2014  ? Dr. Oneida Alar: moderate non-erosive gastritis, no definite stricture, empiric dilation . negative H.pylori   ? growth removed from intestine    ? [facility]-as teenager, done through colonoscopy  ? PACEMAKER GENERATOR CHANGE  09/18/2006  ? generator change by Dr Leonia Reeves with a MDT Adapta L  ? PACEMAKER INSERTION  09/13/1995  ? for sick sinus syndome and syncope at [facility]  ? PPM GENERATOR CHANGEOUT N/A 06/11/2018  ?  Medtronic Azure XT DR MRI SureScan model [id number] (serial number [id number]) pacemaker by Dr Rayann Heman  ? right  Achilles tendon    ? X 2  ? TONSILLECTOMY    ? ? ?Current Outpatient Medications  ?Medication Sig Dispense Refill  ? albuterol (PROVENTIL HFA;VENTOLIN HFA) 108 (90 BASE) MCG/ACT inhaler Inhale 2 puffs into the lungs every 6 (six) hours as needed for wheezing or shortness of breath.    ? aspirin EC 81 MG tablet Take 1 tablet (81 mg total) by mouth daily. 90 tablet 3  ? brimonidine (ALPHAGAN) 0.2 % ophthalmic solution 1 drop 3  (three) times daily.    ? carvedilol (COREG) 6.25 MG tablet Take 1 tablet (6.25 mg total) by mouth 2 (two) times daily. 180 tablet 3  ? cetirizine (ZYRTEC) 10 MG tablet Take 10 mg by mouth at bedtime as needed for allergies.     ? COSOPT 22.3-6.8 MG/ML ophthalmic solution Place 1 drop into both eyes 2 (two) times daily.    ? diclofenac (VOLTAREN) 75 MG EC tablet Take 75 mg by mouth daily as needed for mild pain.    ? ketorolac (ACULAR) 0.5 % ophthalmic solution Place 1 drop into the left eye 3 (three) times daily.    ? levothyroxine (SYNTHROID, LEVOTHROID) 112 MCG tablet Take 112 mcg by mouth daily before breakfast.     ? pantoprazole (PROTONIX) 40 MG tablet Take 1 tablet (40 mg total) by mouth 2 (two) times daily before a meal. 60 tablet 5  ? potassium chloride SA (K-DUR,KLOR-CON) 20 MEQ tablet Take 20 mEq by mouth daily.    ? rosuvastatin (CRESTOR) 20 MG tablet Take 5 mg by mouth once a week.  6  ? valsartan-hydrochlorothiazide (DIOVAN-HCT) 320-25 MG per tablet Take 1 tablet by mouth daily.    ? acetaminophen (TYLENOL) 500 MG tablet Take 500 mg by mouth every 6 (six) hours as needed. (Patient not taking: Reported on 07/12/2021)    ? amLODipine (NORVASC) 5 MG tablet Take 1 tablet (5 mg total) by mouth daily. 90 tablet 1  ? meclizine (ANTIVERT) 25 MG tablet Take 1 tablet (25 mg total) by mouth 3 (three) times daily as needed for dizziness. (Patient not taking: Reported on 09/22/2021) 21 tablet 0  ? nitroGLYCERIN (NITROSTAT) 0.4 MG SL tablet Place 1 tablet (0.4 mg total) under the tongue every 5 (five) minutes as needed for chest pain. (Patient not taking: Reported on 09/22/2021) 25 tablet 3  ? ?No current facility-administered medications for this visit.  ? ? ?Allergies as of 09/22/2021 - Review Complete 09/22/2021  ?Allergen Reaction Noted  ? Gabapentin  12/23/2016  ? ? ?Family History  ?Problem Relation Age of Onset  ? Hyperlipidemia Mother   ? Hypertension Mother   ? Heart disease Father   ? Hypertension Father   ?  Hyperlipidemia Sister   ? Hypertension Sister   ? Colon polyps Sister   ? Hyperlipidemia Brother   ? Hypertension Brother   ? Colon polyps Brother   ? Colon cancer Neg Hx   ? ? ?Social History  ? ?Socioeconomic History  ? Marital status: Married  ?  Spouse name: Not on file  ? Number of children: 1  ? Years of education: Not on file  ? Highest education level: Not on file  ?Occupational History  ? Occupation: part-time hairdresser  ?Tobacco Use  ? Smoking status: Former  ?  Packs/day: 0.25  ?  Years: 4.00  ?  Pack years: 1.00  ?  Types: Cigarettes  ?  Quit date: 10/09/1985  ?  Years since quitting: 35.9  ? Smokeless tobacco:  Never  ? Tobacco comments:  ?  just in teens  ?Vaping Use  ? Vaping Use: Never used  ?Substance and Sexual Activity  ? Alcohol use: No  ?  Alcohol/week: 0.0 standard drinks  ? Drug use: No  ? Sexual activity: Not on file  ?Other Topics Concern  ? Not on file  ?Social History Narrative  ? Lives in [place] with spouse.  Son is healthy at age 46.  ? Disabled  ?   ?   ? ?Social Determinants of Health  ? ?Financial Resource Strain: Not on file  ?Food Insecurity: Not on file  ?Transportation Needs: Not on file  ?Physical Activity: Not on file  ?Stress: Not on file  ?Social Connections: Not on file  ? ? ?Subjective: ?Review of Systems  ?Constitutional:  Negative for chills and fever.  ?HENT:  Negative for congestion and hearing loss.   ?Eyes:  Negative for blurred vision and double vision.  ?Respiratory:  Negative for cough and shortness of breath.   ?Cardiovascular:  Negative for chest pain and palpitations.  ?Gastrointestinal:  Positive for heartburn. Negative for abdominal pain, blood in stool, constipation, diarrhea, melena and vomiting.  ?     Dysphagia  ?Genitourinary:  Negative for dysuria and urgency.  ?Musculoskeletal:  Negative for joint pain and myalgias.  ?Skin:  Negative for itching and rash.  ?Neurological:  Negative for dizziness and headaches.  ?Psychiatric/Behavioral:  Negative for  depression. The patient is not nervous/anxious.   ? ? ?Objective: ?BP 118/60   Pulse 84   Temp (!) 97.3 ?F (36.3 ?C)   Ht 5\' 4"  (1.626 m)   Wt 201 lb 3.2 oz (91.3 kg)   BMI 34.54 kg/m?  ?Physical Exam ?Constitution

## 2021-09-22 NOTE — H&P (View-Only) (Signed)
Referring Provider: Ignatius Specking, MD Primary Care Physician:  Ignatius Specking, MD Primary GI:  Dr. Marletta Lor  Chief Complaint  Patient presents with   Follow-up    Pt needing to know regarding last colonoscopy. Pt having trouble swallowing    HPI:   Sylvia Anthony is a 66 y.o. female who presents to clinic today for follow-up visit.  History of chronic GERD relatively well controlled on pantoprazole twice daily.  History of esophageal dysphagia in the past.  Underwent EGD with dilation in 2016.  States this helped her symptoms at the time.  Now having resurgence of esophageal dysphagia.  Feels as though food gets stuck in her substernal region.  Esophageal biopsies at that time showed reflux changes.  Gastric biopsy showed gastric intestinal metaplasia.    Patient denies any melena hematochezia.  Does chronically take diclofenac as well as ibuprofen as needed for joint pains.  History of adenomatous colon polyps.  Due for colonoscopy recall 2024.  Past Medical History:  Diagnosis Date   Anemia    Arm numbness    Cataract    Mixed form OU   Fibromyalgia    GERD (gastroesophageal reflux disease)    Glucose intolerance (impaired glucose tolerance)    H. pylori infection 01/2012   Amoxicillin, Biaxin   Hyperlipidemia    Hypertension    Hypertensive retinopathy    OU   Hypothyroidism    Mitral valve prolapse    not seen on recent echoes   Rheumatic fever    age 48, was at [facility] for [facility] weeks   SSS (sick sinus syndrome) (HCC)    1997 ppm, gen change 2020 (MDT)    Past Surgical History:  Procedure Laterality Date   ABDOMINAL HYSTERECTOMY     partial   APPENDECTOMY     CARDIAC CATHETERIZATION     multiple   CHOLECYSTECTOMY     COLONOSCOPY  08/01/2006   3 mm descending colon polyp removed/8 mm sessile ascending colon polyp removed / 3-mm rectal  polyp removed /Rare sigmoid diverticulosis/ Moderate internal hemorrhoids.Advanced adenoma on colonoscopy in March  2008.  The polyp was anadenomatous polyp with a foci of high-grade dysplasia   COLONOSCOPY  09/08/2008   SIMPLE ADENOMA/HYPERPLASTIC POLY/Multiple colon polyps (ascending, sigmoid, rectal)  Mild sigmoid colon diverticulosis./ Small internal hemorrhoids   COLONOSCOPY N/A 12/11/2017   Dr. [name]: diverticulosis, hemorrhoids next surveillance tcs 5 years.    COLONOSCOPY WITH ESOPHAGOGASTRODUODENOSCOPY (EGD)  Sept. 30, 2013   ZMO:[id number] diverticulosis was noted in the sigmoid colon/The colon was otherwise normal/Small internal hemorrhoids/EGD:The mucosa of the esophagus appeared normal/Non-erosive gastritis (inflammation) was found; multiple bx/The duodenal mucosa showed no abnormalities. +H.pylori gastritis, treated with equivalent of prevpac. SAVARY DILATION   ESOPHAGEAL DILATION N/A 10/07/2014   Procedure: ESOPHAGEAL DILATION;  Surgeon: West Bali, MD;  [facility]: [facility];  Service: Endoscopy;  Laterality: N/A;   ESOPHAGOGASTRODUODENOSCOPY N/A 10/07/2014   Dr. [name]: moderate non-erosive gastritis, no definite stricture, empiric dilation . negative H.pylori    growth removed from intestine     [facility]-as teenager, done through colonoscopy   PACEMAKER GENERATOR CHANGE  09/18/2006   generator change by Dr Amil Amen with a MDT Adapta L   PACEMAKER INSERTION  09/13/1995   for sick sinus syndome and syncope at [facility] GENERATOR CHANGEOUT N/A 06/11/2018    Medtronic Azure XT DR MRI SureScan model [id number] (serial number [id number]) pacemaker by Dr Johney Frame   right  Achilles tendon     X 2   TONSILLECTOMY      Current Outpatient Medications  Medication Sig Dispense Refill   albuterol (PROVENTIL HFA;VENTOLIN HFA) 108 (90 BASE) MCG/ACT inhaler Inhale 2 puffs into the lungs every 6 (six) hours as needed for wheezing or shortness of breath.     aspirin EC 81 MG tablet Take 1 tablet (81 mg total) by mouth daily. 90 tablet 3   brimonidine (ALPHAGAN) 0.2 % ophthalmic solution 1 drop 3  (three) times daily.     carvedilol (COREG) 6.25 MG tablet Take 1 tablet (6.25 mg total) by mouth 2 (two) times daily. 180 tablet 3   cetirizine (ZYRTEC) 10 MG tablet Take 10 mg by mouth at bedtime as needed for allergies.      COSOPT 22.3-6.8 MG/ML ophthalmic solution Place 1 drop into both eyes 2 (two) times daily.     diclofenac (VOLTAREN) 75 MG EC tablet Take 75 mg by mouth daily as needed for mild pain.     ketorolac (ACULAR) 0.5 % ophthalmic solution Place 1 drop into the left eye 3 (three) times daily.     levothyroxine (SYNTHROID, LEVOTHROID) 112 MCG tablet Take 112 mcg by mouth daily before breakfast.      pantoprazole (PROTONIX) 40 MG tablet Take 1 tablet (40 mg total) by mouth 2 (two) times daily before a meal. 60 tablet 5   potassium chloride SA (K-DUR,KLOR-CON) 20 MEQ tablet Take 20 mEq by mouth daily.     rosuvastatin (CRESTOR) 20 MG tablet Take 5 mg by mouth once a week.  6   valsartan-hydrochlorothiazide (DIOVAN-HCT) 320-25 MG per tablet Take 1 tablet by mouth daily.     acetaminophen (TYLENOL) 500 MG tablet Take 500 mg by mouth every 6 (six) hours as needed. (Patient not taking: Reported on 07/12/2021)     amLODipine (NORVASC) 5 MG tablet Take 1 tablet (5 mg total) by mouth daily. 90 tablet 1   meclizine (ANTIVERT) 25 MG tablet Take 1 tablet (25 mg total) by mouth 3 (three) times daily as needed for dizziness. (Patient not taking: Reported on 09/22/2021) 21 tablet 0   nitroGLYCERIN (NITROSTAT) 0.4 MG SL tablet Place 1 tablet (0.4 mg total) under the tongue every 5 (five) minutes as needed for chest pain. (Patient not taking: Reported on 09/22/2021) 25 tablet 3   No current facility-administered medications for this visit.    Allergies as of 09/22/2021 - Review Complete 09/22/2021  Allergen Reaction Noted   Gabapentin  12/23/2016    Family History  Problem Relation Age of Onset   Hyperlipidemia Mother    Hypertension Mother    Heart disease Father    Hypertension Father     Hyperlipidemia Sister    Hypertension Sister    Colon polyps Sister    Hyperlipidemia Brother    Hypertension Brother    Colon polyps Brother    Colon cancer Neg Hx     Social History   Socioeconomic History   Marital status: Married    Spouse name: Not on file   Number of children: 1   Years of education: Not on file   Highest education level: Not on file  Occupational History   Occupation: part-time hairdresser  Tobacco Use   Smoking status: Former    Packs/day: 0.25    Years: 4.00    Pack years: 1.00    Types: Cigarettes    Quit date: 10/09/1985    Years since quitting: 35.9   Smokeless tobacco:  Never   Tobacco comments:    just in teens  Vaping Use   Vaping Use: Never used  Substance and Sexual Activity   Alcohol use: No    Alcohol/week: 0.0 standard drinks   Drug use: No   Sexual activity: Not on file  Other Topics Concern   Not on file  Social History Narrative   [place].  Son is healthy at age 68.   Disabled         Social Determinants of Health   Financial Resource Strain: Not on file  Food Insecurity: Not on file  Transportation Needs: Not on file  Physical Activity: Not on file  Stress: Not on file  Social Connections: Not on file    Subjective: Review of Systems  Constitutional:  Negative for chills and fever.  HENT:  Negative for congestion and hearing loss.   Eyes:  Negative for blurred vision and double vision.  Respiratory:  Negative for cough and shortness of breath.   Cardiovascular:  Negative for chest pain and palpitations.  Gastrointestinal:  Positive for heartburn. Negative for abdominal pain, blood in stool, constipation, diarrhea, melena and vomiting.       Dysphagia  Genitourinary:  Negative for dysuria and urgency.  Musculoskeletal:  Negative for joint pain and myalgias.  Skin:  Negative for itching and rash.  Neurological:  Negative for dizziness and headaches.  Psychiatric/Behavioral:  Negative for  depression. The patient is not nervous/anxious.     Objective: BP 118/60   Pulse 84   Temp (!) 97.3 F (36.3 C)   Ht 5\' 4"  (1.626 m)   Wt 201 lb 3.2 oz (91.3 kg)   BMI 34.54 kg/m  Physical Exam Constitutional:      Appearance: Normal appearance.  HENT:     Head: Normocephalic and atraumatic.  Eyes:     Extraocular Movements: Extraocular movements intact.     Conjunctiva/sclera: Conjunctivae normal.  Cardiovascular:     Rate and Rhythm: Normal rate and regular rhythm.  Pulmonary:     Effort: Pulmonary effort is normal.     Breath sounds: Normal breath sounds.  Abdominal:     General: Bowel sounds are normal.     Palpations: Abdomen is soft.  Musculoskeletal:        General: No swelling. Normal range of motion.     Cervical back: Normal range of motion and neck supple.  Skin:    General: Skin is warm and dry.     Coloration: Skin is not jaundiced.  Neurological:     General: No focal deficit present.     Mental Status: She is alert and oriented to person, place, and time.  Psychiatric:        Mood and Affect: Mood normal.        Behavior: Behavior normal.     Assessment: *GERD-relative well controlled on pantoprazole BID *Esophageal dysphagia-worsening *Adenomatous colon polyps *Chronic NSAID use  Plan: Will schedule for EGD with possible dilation to evaluate for peptic ulcer disease, esophagitis, gastritis, H. Pylori, duodenitis, or other. Will also evaluate for esophageal stricture, Schatzki's ring, esophageal web or other.   The risks including infection, bleed, or perforation as well as benefits, limitations, alternatives and imponderables have been reviewed with the patient. Potential for esophageal dilation, biopsy, etc. have also been reviewed.  Questions have been answered. All parties agreeable.  Continue on pantoprazole twice daily, may need to make adjustments pending endoscopic evaluation.  Colonoscopy recall 2024  09/22/2021  2:08 PM   Disclaimer:  This note was dictated with voice recognition software. Similar sounding words can inadvertently be transcribed and may not be corrected upon review.

## 2021-09-24 NOTE — Progress Notes (Signed)
Remote pacemaker transmission.   

## 2021-09-26 DIAGNOSIS — I10 Essential (primary) hypertension: Secondary | ICD-10-CM | POA: Diagnosis not present

## 2021-09-29 DIAGNOSIS — H268 Other specified cataract: Secondary | ICD-10-CM | POA: Diagnosis not present

## 2021-09-29 DIAGNOSIS — H25811 Combined forms of age-related cataract, right eye: Secondary | ICD-10-CM | POA: Diagnosis not present

## 2021-10-08 ENCOUNTER — Ambulatory Visit (HOSPITAL_COMMUNITY)
Admission: RE | Admit: 2021-10-08 | Discharge: 2021-10-08 | Disposition: A | Discharge status: Home / Self Care | Payer: Medicare Other | Source: Ambulatory Visit | Attending: Internal Medicine | Admitting: Internal Medicine

## 2021-10-08 ENCOUNTER — Encounter: Payer: Self-pay | Admitting: Internal Medicine

## 2021-10-08 ENCOUNTER — Ambulatory Visit (INDEPENDENT_AMBULATORY_CARE_PROVIDER_SITE_OTHER): Payer: Medicare Other | Admitting: Internal Medicine

## 2021-10-08 VITALS — BP 128/84 | HR 91 | Ht 64.0 in | Wt 196.2 lb

## 2021-10-08 DIAGNOSIS — M79605 Pain in left leg: Secondary | ICD-10-CM

## 2021-10-08 DIAGNOSIS — I10 Essential (primary) hypertension: Secondary | ICD-10-CM

## 2021-10-08 DIAGNOSIS — I495 Sick sinus syndrome: Secondary | ICD-10-CM | POA: Diagnosis not present

## 2021-10-08 NOTE — Patient Instructions (Addendum)
Medication Instructions:  ?Your physician recommends that you continue on your current medications as directed. Please refer to the Current Medication list given to you today. ? ?Labwork: ?none ? ?Testing/Procedures: ?Your physician has requested that you have a lower or upper extremity venous duplex. This test is an ultrasound of the veins in the legs or arms. It looks at venous blood flow that carries blood from the heart to the legs or arms. Allow one hour for a Lower Venous exam. Allow thirty minutes for an Upper Venous exam. There are no restrictions or special instructions. ? ?Follow-Up: ?Your physician recommends that you schedule a follow-up appointment in: 1 year. You will receive a reminder call in the mail in about 10 months reminding you to call and schedule your appointment. If you don't receive this call, please contact our office. ? ?Any Other Special Instructions Will Be Listed Below (If Applicable). ? ?If you need a refill on your cardiac medications before your next appointment, please call your pharmacy. ?

## 2021-10-08 NOTE — Progress Notes (Signed)
? ? ?PCP: Ignatius Specking, MD ?Primary Cardiologist: Dr Katrinka Blazing ?Primary EP:  Dr Johney Frame ? ?Sylvia Anthony is a 66 y.o. female who presents today for routine electrophysiology followup.  Since last being seen in our clinic, the patient reports doing reasonably well.  Her primary complaint is with L leg swelling and calf pain.  This has occurred progressively over the past 2 weeks.  She denies h/o travel, prior blood clots, recent surgery, personal history of thrombosis, or FH.  No other DVT risk factors identified.  Today, she denies symptoms of palpitations, chest pain, shortness of breath,  dizziness, presyncope, or syncope.  The patient is otherwise without complaint today.  ? ?Past Medical History:  ?Diagnosis Date  ? Anemia   ? Arm numbness   ? Cataract   ? Mixed form OU  ? Fibromyalgia   ? GERD (gastroesophageal reflux disease)   ? Glucose intolerance (impaired glucose tolerance)   ? H. pylori infection 01/2012  ? Amoxicillin, Biaxin  ? Hyperlipidemia   ? Hypertension   ? Hypertensive retinopathy   ? OU  ? Hypothyroidism   ? Mitral valve prolapse   ? not seen on recent echoes  ? Rheumatic fever   ? age 24, was at [facility] for 17 weeks  ? SSS (sick sinus syndrome) (HCC)   ? 1997 ppm, gen change 2020 (MDT)  ? ?Past Surgical History:  ?Procedure Laterality Date  ? ABDOMINAL HYSTERECTOMY    ? partial  ? APPENDECTOMY    ? CARDIAC CATHETERIZATION    ? multiple  ? CHOLECYSTECTOMY    ? COLONOSCOPY  08/01/2006  ? 3 mm descending colon polyp removed/8 mm sessile ascending colon polyp removed / 3-mm rectal  polyp removed /Rare sigmoid diverticulosis/ Moderate internal hemorrhoids.Advanced adenoma on colonoscopy in March 2008.  The polyp was anadenomatous polyp with a foci of high-grade dysplasia  ? COLONOSCOPY  09/08/2008  ? SIMPLE ADENOMA/HYPERPLASTIC POLY/Multiple colon polyps (ascending, sigmoid, rectal)  Mild sigmoid colon diverticulosis./ Small internal hemorrhoids  ? COLONOSCOPY N/A 12/11/2017  ? Dr. [name]:  diverticulosis, hemorrhoids next surveillance tcs 5 years.   ? COLONOSCOPY WITH ESOPHAGOGASTRODUODENOSCOPY (EGD)  Sept. 30, 2013  ? YOV:ZCHY diverticulosis was noted in the sigmoid colon/The colon was otherwise normal/Small internal hemorrhoids/EGD:The mucosa of the esophagus appeared normal/Non-erosive gastritis (inflammation) was found; multiple bx/The duodenal mucosa showed no abnormalities. +H.pylori gastritis, treated with equivalent of prevpac. SAVARY DILATION  ? ESOPHAGEAL DILATION N/A 10/07/2014  ? Procedure: ESOPHAGEAL DILATION;  Surgeon: West Bali, MD;  Location: [facility];  Service: Endoscopy;  Laterality: N/A;  ? ESOPHAGOGASTRODUODENOSCOPY N/A 10/07/2014  ? Dr. [name]: moderate non-erosive gastritis, no definite stricture, empiric dilation . negative H.pylori   ? growth removed from intestine    ? [facility]-as teenager, done through colonoscopy  ? PACEMAKER GENERATOR CHANGE  09/18/2006  ? generator change by Dr Amil Amen with a MDT Adapta L  ? PACEMAKER INSERTION  09/13/1995  ? for sick sinus syndome and syncope at [facility]  ? PPM GENERATOR CHANGEOUT N/A 06/11/2018  ?  Medtronic Azure XT DR MRI SureScan model [id number] (serial number [id number]) pacemaker by Dr Johney Frame  ? right Achilles tendon    ? X 2  ? TONSILLECTOMY    ? ? ?ROS- all systems are reviewed and negative except as per HPI above ? ?Current Outpatient Medications  ?Medication Sig Dispense Refill  ? acetaminophen (TYLENOL) 500 MG tablet Take 500 mg by mouth every 6 (six) hours as needed.    ?  albuterol (PROVENTIL HFA;VENTOLIN HFA) 108 (90 BASE) MCG/ACT inhaler Inhale 2 puffs into the lungs every 6 (six) hours as needed for wheezing or shortness of breath.    ? amLODipine (NORVASC) 10 MG tablet Take 10 mg by mouth daily.    ? aspirin EC 81 MG tablet Take 1 tablet (81 mg total) by mouth daily. 90 tablet 3  ? brimonidine (ALPHAGAN) 0.2 % ophthalmic solution 1 drop 3 (three) times daily.    ? carvedilol (COREG) 6.25 MG tablet Take 1 tablet  (6.25 mg total) by mouth 2 (two) times daily. 180 tablet 3  ? cetirizine (ZYRTEC) 10 MG tablet Take 10 mg by mouth at bedtime as needed for allergies.     ? COSOPT 22.3-6.8 MG/ML ophthalmic solution Place 1 drop into both eyes 2 (two) times daily.    ? diclofenac (VOLTAREN) 75 MG EC tablet Take 75 mg by mouth daily as needed for mild pain.    ? ketorolac (ACULAR) 0.5 % ophthalmic solution Place 1 drop into the left eye 3 (three) times daily.    ? levothyroxine (SYNTHROID, LEVOTHROID) 112 MCG tablet Take 112 mcg by mouth daily before breakfast.     ? meclizine (ANTIVERT) 25 MG tablet Take 1 tablet (25 mg total) by mouth 3 (three) times daily as needed for dizziness. 21 tablet 0  ? nitroGLYCERIN (NITROSTAT) 0.4 MG SL tablet Place 1 tablet (0.4 mg total) under the tongue every 5 (five) minutes as needed for chest pain. 25 tablet 3  ? pantoprazole (PROTONIX) 40 MG tablet Take 1 tablet (40 mg total) by mouth 2 (two) times daily before a meal. 60 tablet 5  ? potassium chloride SA (K-DUR,KLOR-CON) 20 MEQ tablet Take 20 mEq by mouth daily.    ? rosuvastatin (CRESTOR) 5 MG tablet Take 5 mg by mouth once a week.    ? valsartan-hydrochlorothiazide (DIOVAN-HCT) 320-25 MG per tablet Take 1 tablet by mouth daily.    ? ?No current facility-administered medications for this visit.  ? ? ?Physical Exam: ?Vitals:  ? 10/08/21 1005  ?BP: 128/84  ?Pulse: 91  ?SpO2: 99%  ?Weight: 196 lb 3.2 oz (89 kg)  ?Height: 5\' 4"  (1.626 m)  ? ? ?GEN- The patient is well appearing, alert and oriented x 3 today.   ?Head- normocephalic, atraumatic ?Eyes-  Sclera clear, conjunctiva pink ?Ears- hearing intact ?Oropharynx- clear ?Lungs- Clear to ausculation bilaterally, normal work of breathing ?Chest- pacemaker pocket is well healed ?Heart- Regular rate and rhythm, no murmurs, rubs or gallops, PMI not laterally displaced ?GI- soft, NT, ND, + BS ?Extremities- no clubbing, cyanosis, or edema ? ?Pacemaker interrogation- reviewed in detail today,  See PACEART  report ? ?Assessment and Plan: ? ?1. Symptomatic sinus bradycardia  ?Normal pacemaker function ?See Art report ?No changes today ?she is not device dependant today ? ?2. HTN ?Stable ?No change required today ? ?3. Overweight ?Body mass index is 33.68 kg/m?. ?Lifestyle modification advised ? ?4. L leg swelling/ calf pain ?Order L leg venous doppler today to exclude DVT ?She already has follow-up with Dr Arita Miss on Monday ?Sodium restriction is advised in addition ?Further workup by Dr Friday pending results of doppler today. ? ?Return in a year to see me ?Follow-up with Dr Sherril Croon on Monday ?Follow-up with Dr Tuesday as scheduled ? ?Katrinka Blazing MD, FACC ?10/08/2021 ?10:25 AM ? ? ?

## 2021-10-12 ENCOUNTER — Encounter (HOSPITAL_COMMUNITY): Payer: Self-pay

## 2021-10-12 ENCOUNTER — Encounter (HOSPITAL_COMMUNITY)
Admission: RE | Admit: 2021-10-12 | Discharge: 2021-10-12 | Disposition: A | Discharge status: Home / Self Care | Payer: Medicare Other | Source: Ambulatory Visit | Attending: Internal Medicine | Admitting: Internal Medicine

## 2021-10-12 VITALS — BP 135/82 | HR 88 | Temp 97.3°F | Resp 18 | Ht 64.0 in | Wt 196.2 lb

## 2021-10-12 DIAGNOSIS — G720 Drug-induced myopathy: Secondary | ICD-10-CM | POA: Diagnosis not present

## 2021-10-12 DIAGNOSIS — D649 Anemia, unspecified: Secondary | ICD-10-CM | POA: Insufficient documentation

## 2021-10-12 DIAGNOSIS — Z01812 Encounter for preprocedural laboratory examination: Secondary | ICD-10-CM | POA: Insufficient documentation

## 2021-10-12 DIAGNOSIS — Z299 Encounter for prophylactic measures, unspecified: Secondary | ICD-10-CM | POA: Diagnosis not present

## 2021-10-12 DIAGNOSIS — T466X5A Adverse effect of antihyperlipidemic and antiarteriosclerotic drugs, initial encounter: Secondary | ICD-10-CM | POA: Diagnosis not present

## 2021-10-12 DIAGNOSIS — Z79899 Other long term (current) drug therapy: Secondary | ICD-10-CM | POA: Diagnosis not present

## 2021-10-12 DIAGNOSIS — M545 Low back pain, unspecified: Secondary | ICD-10-CM | POA: Diagnosis not present

## 2021-10-12 DIAGNOSIS — I10 Essential (primary) hypertension: Secondary | ICD-10-CM | POA: Diagnosis not present

## 2021-10-12 LAB — CBC WITH DIFFERENTIAL/PLATELET
Abs Immature Granulocytes: 0.03 10*3/uL (ref 0.00–0.07)
Basophils Absolute: 0 10*3/uL (ref 0.0–0.1)
Basophils Relative: 1 %
Eosinophils Absolute: 0.1 10*3/uL (ref 0.0–0.5)
Eosinophils Relative: 2 %
HCT: 44.7 % (ref 36.0–46.0)
Hemoglobin: 14.3 g/dL (ref 12.0–15.0)
Immature Granulocytes: 1 %
Lymphocytes Relative: 29 %
Lymphs Abs: 1.9 10*3/uL (ref 0.7–4.0)
MCH: 27.5 pg (ref 26.0–34.0)
MCHC: 32 g/dL (ref 30.0–36.0)
MCV: 86 fL (ref 80.0–100.0)
Monocytes Absolute: 0.6 10*3/uL (ref 0.1–1.0)
Monocytes Relative: 9 %
Neutro Abs: 3.9 10*3/uL (ref 1.7–7.7)
Neutrophils Relative %: 58 %
Platelets: 265 10*3/uL (ref 150–400)
RBC: 5.2 MIL/uL — ABNORMAL HIGH (ref 3.87–5.11)
RDW: 13 % (ref 11.5–15.5)
WBC: 6.6 10*3/uL (ref 4.0–10.5)
nRBC: 0 % (ref 0.0–0.2)

## 2021-10-12 LAB — BASIC METABOLIC PANEL
Anion gap: 6 (ref 5–15)
BUN: 10 mg/dL (ref 8–23)
CO2: 28 mmol/L (ref 22–32)
Calcium: 9.3 mg/dL (ref 8.9–10.3)
Chloride: 106 mmol/L (ref 98–111)
Creatinine, Ser: 0.88 mg/dL (ref 0.44–1.00)
GFR, Estimated: >60 mL/min (ref >60)
Glucose, Bld: 113 mg/dL — ABNORMAL HIGH (ref 70–99)
Potassium: 2.9 mmol/L — ABNORMAL LOW (ref 3.5–5.1)
Sodium: 140 mmol/L (ref 135–145)

## 2021-10-12 NOTE — Patient Instructions (Signed)
? ? ? ? ? ? ? ? Sylvia Anthony ? 10/12/2021  ?  ? @PREFPERIOPPHARMACY @ ? ? Your procedure is scheduled on 10/18/2021. ? ? ? Report to 10/20/2021 at  1230  P.M. ? ? Call this number if you have problems the morning of surgery: ? [phone] ? ? Remember: ? Follow the diet and prep instructions given to you by the office. ? ?  Use your inhaler before you come and bring your rescue inhaler with you. ?  ? Take these medicines the morning of surgery with A SIP OF WATER  ? ?      amlodipine, carvedilol, voltaren(if needed), levothyroxine, antivert(if needed), protonix. ?  ? ? Do not wear jewelry, make-up or nail polish. ? Do not wear lotions, powders, or perfumes, or deodorant. ? Do not shave 48 hours prior to surgery.  Men may shave face and neck. ? Do not bring valuables to the hospital. ? [name] is not responsible for any belongings or valuables. ? ?Contacts, dentures or bridgework may not be worn into surgery.  Leave your suitcase in the car.  After surgery it may be brought to your room. ? ?For patients admitted to the hospital, discharge time will be determined by your treatment team. ? ?Patients discharged the day of surgery will not be allowed to drive home and must have someone with them for 24 hours.  ? ? ?Special instructions:   DO NOT smoke tobacco or vape for 24 hours before your procedure. ? ?Please read over the following fact sheets that you were given. ?Anesthesia Post-op Instructions and Care and Recovery After Surgery ?  ? ? ? Upper Endoscopy, Adult, Care After ?This sheet gives you information about how to care for yourself after your procedure. Your health care provider may also give you more specific instructions. If you have problems or questions, contact your health care provider. ?What can I expect after the procedure? ?After the procedure, it is common to have: ?A sore throat. ?Mild stomach pain or discomfort. ?Bloating. ?Nausea. ?Follow these instructions at home: ? ?Follow  instructions from your health care provider about what to eat or drink after your procedure. ?Return to your normal activities as told by your health care provider. Ask your health care provider what activities are safe for you. ?Take over-the-counter and prescription medicines only as told by your health care provider. ?If you were given a sedative during the procedure, it can affect you for several hours. Do not drive or operate machinery until your health care provider says that it is safe. ?Keep all follow-up visits as told by your health care provider. This is important. ?Contact a health care provider if you have: ?A sore throat that lasts longer than one day. ?Trouble swallowing. ?Get help right away if: ?You vomit blood or your vomit looks like coffee grounds. ?You have: ?A fever. ?Bloody, black, or tarry stools. ?A severe sore throat or you cannot swallow. ?Difficulty breathing. ?Severe pain in your chest or abdomen. ?Summary ?After the procedure, it is common to have a sore throat, mild stomach discomfort, bloating, and nausea. ?If you were given a sedative during the procedure, it can affect you for several hours. Do not drive or operate machinery until your health care provider says that it is safe. ?Follow instructions from your health care provider about what to eat or drink after your procedure. ?Return to your normal activities as told by your health care provider. ?This information is not intended to  replace advice given to you by your health care provider. Make sure you discuss any questions you have with your health care provider. ?Document Revised: 03/22/2019 Document Reviewed: 10/16/2017 ?Elsevier Patient Education ? 2023 [employer] ?Esophageal Dilatation ?Esophageal dilatation, also called esophageal dilation, is a procedure to widen or open a blocked or narrowed part of the esophagus. The esophagus is the part of the body that moves food and liquid from the mouth to the stomach. You may  need this procedure if: ?You have a buildup of scar tissue in your esophagus that makes it difficult, painful, or impossible to swallow. This can be caused by gastroesophageal reflux disease (GERD). ?You have cancer of the esophagus. ?There is a problem with how food moves through your esophagus. ?In some cases, you may need this procedure repeated at a later time to dilate the esophagus gradually. ?Tell a health care provider about: ?Any allergies you have. ?All medicines you are taking, including vitamins, herbs, eye drops, creams, and over-the-counter medicines. ?Any problems you or family members have had with anesthetic medicines. ?Any blood disorders you have. ?Any surgeries you have had. ?Any medical conditions you have. ?Any antibiotic medicines you are required to take before dental procedures. ?Whether you are pregnant or may be pregnant. ?What are the risks? ?Generally, this is a safe procedure. However, problems may occur, including: ?Bleeding due to a tear in the lining of the esophagus. ?A hole, or perforation, in the esophagus. ?What happens before the procedure? ?Ask your health care provider about: ?Changing or stopping your regular medicines. This is especially important if you are taking diabetes medicines or blood thinners. ?Taking medicines such as aspirin and ibuprofen. These medicines can thin your blood. Do not take these medicines unless your health care provider tells you to take them. ?Taking over-the-counter medicines, vitamins, herbs, and supplements. ?Follow instructions from your health care provider about eating or drinking restrictions. ?Plan to have a responsible adult take you home from the hospital or clinic. ?Plan to have a responsible adult care for you for the time you are told after you leave the hospital or clinic. This is important. ?What happens during the procedure? ?You may be given a medicine to help you relax (sedative). ?A numbing medicine may be sprayed into the back  of your throat, or you may gargle the medicine. ?Your health care provider may perform the dilatation using various surgical instruments, such as: ?Simple dilators. This instrument is carefully placed in the esophagus to stretch it. ?Guided wire bougies. This involves using an endoscope to insert a wire into the esophagus. A dilator is passed over this wire to enlarge the esophagus. Then the wire is removed. ?Balloon dilators. An endoscope with a small balloon is inserted into the esophagus. The balloon is inflated to stretch the esophagus and open it up. ?The procedure may vary among health care providers and hospitals. ?What can I expect after the procedure? ?Your blood pressure, heart rate, breathing rate, and blood oxygen level will be monitored until you leave the hospital or clinic. ?Your throat may feel slightly sore and numb. This will get better over time. ?You will not be allowed to eat or drink until your throat is no longer numb. ?When you are able to drink, urinate, and sit on the edge of the bed without nausea or dizziness, you may be able to return home. ?Follow these instructions at home: ?Take over-the-counter and prescription medicines only as told by your health care provider. ?If you were given  a sedative during the procedure, it can affect you for several hours. Do not drive or operate machinery until your health care provider says that it is safe. ?Plan to have a responsible adult care for you for the time you are told. This is important. ?Follow instructions from your health care provider about any eating or drinking restrictions. ?Do not use any products that contain nicotine or tobacco, such as cigarettes, e-cigarettes, and chewing tobacco. If you need help quitting, ask your health care provider. ?Keep all follow-up visits. This is important. ?Contact a health care provider if: ?You have a fever. ?You have pain that is not relieved by medicine. ?Get help right away if: ?You have chest  pain. ?You have trouble breathing. ?You have trouble swallowing. ?You vomit blood. ?You have black, tarry, or bloody stools. ?These symptoms may represent a serious problem that is an emergency. Do not wait to see if th

## 2021-10-13 LAB — CUP PACEART INCLINIC DEVICE CHECK
Date Time Interrogation Session: 20230512102419
Implantable Lead Implant Date: 19970416
Implantable Lead Implant Date: 19970416
Implantable Lead Location: 753859
Implantable Lead Location: 753860
Implantable Lead Model: 5034
Implantable Lead Model: 5534
Implantable Pulse Generator Implant Date: 20200113

## 2021-10-14 ENCOUNTER — Encounter (HOSPITAL_COMMUNITY): Payer: Medicare Other

## 2021-10-18 ENCOUNTER — Ambulatory Visit (HOSPITAL_COMMUNITY): Payer: Medicare Other | Admitting: Anesthesiology

## 2021-10-18 ENCOUNTER — Encounter (HOSPITAL_COMMUNITY): Payer: Self-pay

## 2021-10-18 ENCOUNTER — Ambulatory Visit (HOSPITAL_BASED_OUTPATIENT_CLINIC_OR_DEPARTMENT_OTHER): Payer: Medicare Other | Admitting: Anesthesiology

## 2021-10-18 ENCOUNTER — Encounter (HOSPITAL_COMMUNITY)
Admission: RE | Disposition: A | Discharge status: Home / Self Care | Payer: Self-pay | Source: Home / Self Care | Attending: Internal Medicine

## 2021-10-18 ENCOUNTER — Ambulatory Visit (HOSPITAL_COMMUNITY)
Admission: RE | Admit: 2021-10-18 | Discharge: 2021-10-18 | Disposition: A | Discharge status: Home / Self Care | Payer: Medicare Other | Attending: Internal Medicine | Admitting: Internal Medicine

## 2021-10-18 DIAGNOSIS — B9681 Helicobacter pylori [H. pylori] as the cause of diseases classified elsewhere: Secondary | ICD-10-CM

## 2021-10-18 DIAGNOSIS — K2950 Unspecified chronic gastritis without bleeding: Secondary | ICD-10-CM | POA: Insufficient documentation

## 2021-10-18 DIAGNOSIS — K2970 Gastritis, unspecified, without bleeding: Secondary | ICD-10-CM

## 2021-10-18 DIAGNOSIS — Z860101 Personal history of adenomatous and serrated colon polyps: Secondary | ICD-10-CM

## 2021-10-18 DIAGNOSIS — I119 Hypertensive heart disease without heart failure: Secondary | ICD-10-CM

## 2021-10-18 DIAGNOSIS — I10 Essential (primary) hypertension: Secondary | ICD-10-CM

## 2021-10-18 DIAGNOSIS — M797 Fibromyalgia: Secondary | ICD-10-CM | POA: Diagnosis not present

## 2021-10-18 DIAGNOSIS — E039 Hypothyroidism, unspecified: Secondary | ICD-10-CM | POA: Insufficient documentation

## 2021-10-18 DIAGNOSIS — K5900 Constipation, unspecified: Secondary | ICD-10-CM

## 2021-10-18 DIAGNOSIS — Z86010 Personal history of colonic polyps: Secondary | ICD-10-CM | POA: Diagnosis not present

## 2021-10-18 DIAGNOSIS — Z87891 Personal history of nicotine dependence: Secondary | ICD-10-CM | POA: Insufficient documentation

## 2021-10-18 DIAGNOSIS — R1310 Dysphagia, unspecified: Secondary | ICD-10-CM

## 2021-10-18 DIAGNOSIS — R1319 Other dysphagia: Secondary | ICD-10-CM

## 2021-10-18 DIAGNOSIS — K219 Gastro-esophageal reflux disease without esophagitis: Secondary | ICD-10-CM | POA: Diagnosis not present

## 2021-10-18 DIAGNOSIS — M542 Cervicalgia: Secondary | ICD-10-CM

## 2021-10-18 DIAGNOSIS — D649 Anemia, unspecified: Secondary | ICD-10-CM

## 2021-10-18 DIAGNOSIS — K449 Diaphragmatic hernia without obstruction or gangrene: Secondary | ICD-10-CM | POA: Diagnosis not present

## 2021-10-18 DIAGNOSIS — K31A0 Gastric intestinal metaplasia, unspecified: Secondary | ICD-10-CM | POA: Diagnosis not present

## 2021-10-18 DIAGNOSIS — R1314 Dysphagia, pharyngoesophageal phase: Secondary | ICD-10-CM | POA: Diagnosis not present

## 2021-10-18 DIAGNOSIS — Z950 Presence of cardiac pacemaker: Secondary | ICD-10-CM

## 2021-10-18 DIAGNOSIS — R12 Heartburn: Secondary | ICD-10-CM | POA: Diagnosis not present

## 2021-10-18 DIAGNOSIS — R911 Solitary pulmonary nodule: Secondary | ICD-10-CM

## 2021-10-18 DIAGNOSIS — Z79899 Other long term (current) drug therapy: Secondary | ICD-10-CM

## 2021-10-18 DIAGNOSIS — I495 Sick sinus syndrome: Secondary | ICD-10-CM

## 2021-10-18 DIAGNOSIS — K31A19 Gastric intestinal metaplasia without dysplasia, unspecified site: Secondary | ICD-10-CM | POA: Diagnosis not present

## 2021-10-18 HISTORY — PX: BALLOON DILATION: SHX5330

## 2021-10-18 HISTORY — PX: ESOPHAGOGASTRODUODENOSCOPY (EGD) WITH PROPOFOL: SHX5813

## 2021-10-18 HISTORY — PX: BIOPSY: SHX5522

## 2021-10-18 SURGERY — ESOPHAGOGASTRODUODENOSCOPY (EGD) WITH PROPOFOL
Anesthesia: General

## 2021-10-18 MED ORDER — PROPOFOL 10 MG/ML IV BOLUS
INTRAVENOUS | Status: DC | PRN
Start: 2021-10-18 — End: 2021-10-18
  Administered 2021-10-18: 40 mg via INTRAVENOUS
  Administered 2021-10-18: 100 mg via INTRAVENOUS

## 2021-10-18 MED ORDER — LIDOCAINE HCL (CARDIAC) PF 100 MG/5ML IV SOSY
PREFILLED_SYRINGE | INTRAVENOUS | Status: DC | PRN
  Administered 2021-10-18: 50 mg via INTRAVENOUS

## 2021-10-18 MED ORDER — LACTATED RINGERS IV SOLN: INTRAVENOUS | Status: DC

## 2021-10-18 NOTE — Anesthesia Preprocedure Evaluation (Addendum)
Anesthesia Evaluation  Patient identified by MRN, date of birth, ID band Patient awake    Reviewed: Allergy & Precautions, NPO status , Patient's Chart, lab work & pertinent test results, reviewed documented beta blocker date and time   History of Anesthesia Complications Negative for: history of anesthetic complications  Airway Mallampati: II  TM Distance: >3 FB Neck ROM: Full    Dental  (+) Dental Advisory Given, Missing   Pulmonary neg pulmonary ROS, former smoker,    Pulmonary exam normal breath sounds clear to auscultation       Cardiovascular Exercise Tolerance: Good hypertension, Pt. on medications and Pt. 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, reviewed Echo, no significant findings  Reproductive/Obstetrics negative OB ROS                           Anesthesia Physical Anesthesia Plan  ASA: 3  Anesthesia Plan: General   Post-op Pain Management: Minimal or no pain anticipated   Induction: Intravenous  PONV Risk Score and Plan: Propofol infusion  Airway Management Planned: Nasal Cannula and Natural Airway  Additional Equipment:   Intra-op Plan:   Post-operative Plan:   Informed Consent: I have reviewed the patients History and Physical, chart, labs and discussed the procedure including the risks, benefits and alternatives for the proposed anesthesia with the patient or authorized representative who has indicated his/her understanding and  acceptance.     Dental advisory given  Plan Discussed with: CRNA and Surgeon  Anesthesia Plan Comments:        Anesthesia Quick Evaluation

## 2021-10-18 NOTE — Op Note (Signed)
[facility] Patient Name: Sylvia Anthony Procedure Date: 10/18/2021 10:04 AM MRN: [id number] Date of Birth: 01-02-1956 Attending MD: Elon Alas. Abbey Chatters DO CSN: [id number] Age: 66 Admit Type: Outpatient Procedure:                Upper GI endoscopy Indications:              Dysphagia, Heartburn Providers:                Elon Alas. Abbey Chatters, DO, Janeece Riggers, RN, [place]                            Page, Caprice Kluver, Thomas Hoff., Technician Referring MD:              Medicines:                See the Anesthesia note for documentation of the                            administered medications Complications:            No immediate complications. Estimated Blood Loss:     Estimated blood loss was minimal. Procedure:                Pre-Anesthesia Assessment:                           - The anesthesia plan was to use monitored                            anesthesia care (MAC).                           After obtaining informed consent, the endoscope was                            passed under direct vision. Throughout the                            procedure, the patient's blood pressure, pulse, and                            oxygen saturations were monitored continuously. The                            GIF-H190 ([id number]) scope was introduced through the                            mouth, and advanced to the second part of duodenum.                            The upper GI endoscopy was accomplished without                            difficulty. The patient tolerated the procedure                            well.  Scope In: 10:35:20 AM Scope Out: 10:40:04 AM Total Procedure Duration: 0 hours 4 minutes 44 seconds  Findings:      A small hiatal hernia was present.      No endoscopic abnormality was evident in the esophagus to explain the       patient's complaint of dysphagia. Preparations were made for empiric       dilation. A TTS dilator was passed through the scope. Dilation  with an       18-19-20 mm balloon dilator was performed to 20 mm. Dilation was       performed with a mild resistance at 20 mm. Estimated blood loss was none.      Patchy mild inflammation characterized by erythema was found in the       gastric body. Biopsies were taken with a cold forceps for Helicobacter       pylori testing.      The duodenal bulb, first portion of the duodenum and second portion of       the duodenum were normal. Impression:               - Small hiatal hernia.                           - Gastritis. Biopsied.                           - Normal duodenal bulb, first portion of the                            duodenum and second portion of the duodenum. Moderate Sedation:      Per Anesthesia Care Recommendation:           - Patient has a contact number available for                            emergencies. The signs and symptoms of potential                            delayed complications were discussed with the                            patient. Return to normal activities tomorrow.                            Written discharge instructions were provided to the                            patient.                           - Resume previous diet.                           - Continue present medications.                           - Await pathology results.                           -  Repeat upper endoscopy PRN for retreatment.                           - Return to GI clinic in 6 months.                           - Use a proton pump inhibitor PO BID. Procedure Code(s):        --- Professional ---                           [phone], Esophagogastroduodenoscopy, flexible,                            transoral; with biopsy, single or multiple Diagnosis Code(s):        --- Professional ---                           K44.9, Diaphragmatic hernia without obstruction or                            gangrene                           K29.70, Gastritis, unspecified, without bleeding                            R13.10, Dysphagia, unspecified                           R12, Heartburn CPT copyright 2019 American Medical Association. All rights reserved. The codes documented in this report are preliminary and upon coder review may  be revised to meet current compliance requirements. Elon Alas. Abbey Chatters, DO [place] Abbey Chatters, DO 10/18/2021 10:49:10 AM This report has been signed electronically. Number of Addenda: 0

## 2021-10-18 NOTE — Discharge Instructions (Addendum)
EGD Discharge instructions Please read the instructions outlined below and refer to this sheet in the next few weeks. These discharge instructions provide you with general information on caring for yourself after you leave the hospital. Your doctor may also give you specific instructions. While your treatment has been planned according to the most current medical practices available, unavoidable complications occasionally occur. If you have any problems or questions after discharge, please call your doctor. ACTIVITY You may resume your regular activity but move at a slower pace for the next 24 hours.  Take frequent rest periods for the next 24 hours.  Walking will help expel (get rid of) the air and reduce the bloated feeling in your abdomen.  No driving for 24 hours (because of the anesthesia (medicine) used during the test).  You may shower.  Do not sign any important legal documents or operate any machinery for 24 hours (because of the anesthesia used during the test).  NUTRITION Drink plenty of fluids.  You may resume your normal diet.  Begin with a light meal and progress to your normal diet.  Avoid alcoholic beverages for 24 hours or as instructed by your caregiver.  MEDICATIONS You may resume your normal medications unless your caregiver tells you otherwise.  WHAT YOU CAN EXPECT TODAY You may experience abdominal discomfort such as a feeling of fullness or "gas" pains.  FOLLOW-UP Your doctor will discuss the results of your test with you.  SEEK IMMEDIATE MEDICAL ATTENTION IF ANY OF THE FOLLOWING OCCUR: Excessive nausea (feeling sick to your stomach) and/or vomiting.  Severe abdominal pain and distention (swelling).  Trouble swallowing.  Temperature over 101 F (37.8 C).  Rectal bleeding or vomiting of blood.   Your EGD revealed mild amount inflammation in your stomach.  I took biopsies of this to rule out infection with a bacteria called H. pylori.  Await pathology results, my  office will contact you.  I did stretch her esophagus today.  Hopefully this helps with your swallowing.  Continue on pantoprazole twice daily.  Follow-up with Verlon Au in 4 to 6 months.    I hope you have a great rest of your week!  Hennie Duos. Marletta Lor, D.O. Gastroenterology and Hepatology [facility]

## 2021-10-18 NOTE — Transfer of Care (Signed)
Immediate Anesthesia Transfer of Care Note  Patient: Sylvia Anthony  Procedure(s) Performed: ESOPHAGOGASTRODUODENOSCOPY (EGD) WITH PROPOFOL BALLOON DILATION BIOPSY  Patient Location: Short Stay  Anesthesia Type:General  Level of Consciousness: drowsy  Airway & Oxygen Therapy: Patient Spontanous Breathing  Post-op Assessment: Report given to RN and Post -op Vital signs reviewed and stable  Post vital signs: Reviewed and stable  Last Vitals:  Vitals Value Taken Time  BP    Temp    Pulse    Resp    SpO2      Last Pain:  Vitals:   10/18/21 1032  TempSrc:   PainSc: 0-No pain      Patients Stated Pain Goal: 5 (10/18/21 1010)  Complications: No notable events documented.

## 2021-10-18 NOTE — Anesthesia Postprocedure Evaluation (Signed)
Anesthesia Post Note  Patient: Sylvia Anthony  Procedure(s) Performed: ESOPHAGOGASTRODUODENOSCOPY (EGD) WITH PROPOFOL BALLOON DILATION BIOPSY  Patient location during evaluation: Phase II Anesthesia Type: General Level of consciousness: awake and alert and oriented Pain management: pain level controlled Vital Signs Assessment: post-procedure vital signs reviewed and stable Respiratory status: spontaneous breathing, nonlabored ventilation and respiratory function stable Cardiovascular status: blood pressure returned to baseline and stable Postop Assessment: no apparent nausea or vomiting Anesthetic complications: no   No notable events documented.   Last Vitals:  Vitals:   10/18/21 1010 10/18/21 1045  BP: 139/84 (!) 95/44  Pulse: 82 64  Resp: 18 (!) 22  Temp: 36.6 C 36.5 C  SpO2: 95% 95%    Last Pain:  Vitals:   10/18/21 1045  TempSrc: Oral  PainSc: 0-No pain                 Raad Clayson C Timothee Gali

## 2021-10-18 NOTE — Interval H&P Note (Signed)
History and Physical Interval Note:  10/18/2021 10:10 AM  Sylvia Anthony  has presented today for surgery, with the diagnosis of dysphagia, gerd.  The various methods of treatment have been discussed with the patient and family. After consideration of risks, benefits and other options for treatment, the patient has consented to  Procedure(s) with comments: ESOPHAGOGASTRODUODENOSCOPY (EGD) WITH PROPOFOL (N/A) - 2:30pm BALLOON DILATION (N/A) as a surgical intervention.  The patient's history has been reviewed, patient examined, no change in status, stable for surgery.  I have reviewed the patient's chart and labs.  Questions were answered to the patient's satisfaction.     Eloise Harman

## 2021-10-20 LAB — SURGICAL PATHOLOGY

## 2021-10-26 ENCOUNTER — Encounter (INDEPENDENT_AMBULATORY_CARE_PROVIDER_SITE_OTHER): Payer: Medicare Other | Admitting: Ophthalmology

## 2021-10-26 ENCOUNTER — Encounter (HOSPITAL_COMMUNITY): Payer: Self-pay | Admitting: Internal Medicine

## 2021-10-26 DIAGNOSIS — H40053 Ocular hypertension, bilateral: Secondary | ICD-10-CM

## 2021-10-26 DIAGNOSIS — I10 Essential (primary) hypertension: Secondary | ICD-10-CM | POA: Diagnosis not present

## 2021-10-26 DIAGNOSIS — H44113 Panuveitis, bilateral: Secondary | ICD-10-CM

## 2021-10-26 DIAGNOSIS — R911 Solitary pulmonary nodule: Secondary | ICD-10-CM

## 2021-10-26 DIAGNOSIS — H35353 Cystoid macular degeneration, bilateral: Secondary | ICD-10-CM

## 2021-10-26 DIAGNOSIS — H40003 Preglaucoma, unspecified, bilateral: Secondary | ICD-10-CM

## 2021-10-26 DIAGNOSIS — H35033 Hypertensive retinopathy, bilateral: Secondary | ICD-10-CM

## 2021-10-26 DIAGNOSIS — H25813 Combined forms of age-related cataract, bilateral: Secondary | ICD-10-CM

## 2021-10-26 DIAGNOSIS — H209 Unspecified iridocyclitis: Secondary | ICD-10-CM

## 2021-10-27 NOTE — Progress Notes (Signed)
[facility] Note  11/01/2021    CHIEF COMPLAINT Patient presents for Retina Follow Up    HISTORY OF PRESENT ILLNESS: Sylvia Anthony is a 66 y.o. female who presents to the clinic today for:   HPI     Retina Follow Up   Patient presents with  Other.  In both eyes.  This started 6 months ago.  I, the attending physician,  performed the HPI with the patient and updated documentation appropriately.        Comments   Patient here for 6 months retina follow up for CME OU. Patient states vision doing a lot better. No eye pain had cataract surgery OD April and OS May. Using blue top drop TID OU and Purple top drop BID OU.       Last edited by [name], MD on 11/01/2021  8:52 AM.    Pt states vision is doing well, the nodule on her lung has shrunk down to pea size, she states she had to have her esophagus stretched bc she was unable to swallow her medication, pt had cataract sx OU with Dr. Lucianne Lei, she is still using a purple top drop BID and blue top TID OU  Referring physician: Glenda Chroman, MD [place],  [place] [address]  HISTORICAL INFORMATION:   Selected notes from the MEDICAL RECORD NUMBER Referred by [name] for concern of bilateral vitritis.   CURRENT MEDICATIONS: Current Outpatient Medications (Ophthalmic Drugs)  Medication Sig   brimonidine (ALPHAGAN) 0.2 % ophthalmic solution Place 1 drop into both eyes 3 (three) times daily.   COSOPT 22.3-6.8 MG/ML ophthalmic solution Place 1 drop into both eyes 2 (two) times daily.   No current facility-administered medications for this visit. (Ophthalmic Drugs)   Current Outpatient Medications (Other)  Medication Sig   acetaminophen (TYLENOL) 500 MG tablet Take 500 mg by mouth every 6 (six) hours as needed for headache.   albuterol (PROVENTIL HFA;VENTOLIN HFA) 108 (90 BASE) MCG/ACT inhaler Inhale 2 puffs into the lungs every 6 (six) hours as needed for wheezing or shortness of breath.    amLODipine (NORVASC) 10 MG tablet Take 10 mg by mouth daily.   aspirin EC 81 MG tablet Take 1 tablet (81 mg total) by mouth daily.   carvedilol (COREG) 6.25 MG tablet Take 1 tablet (6.25 mg total) by mouth 2 (two) times daily.   cetirizine (ZYRTEC) 10 MG tablet Take 10 mg by mouth at bedtime as needed for allergies.    diclofenac (VOLTAREN) 75 MG EC tablet Take 75 mg by mouth daily as needed for mild pain.   ibuprofen (ADVIL) 200 MG tablet Take 400 mg by mouth every 8 (eight) hours as needed (pain.).   levothyroxine (SYNTHROID, LEVOTHROID) 112 MCG tablet Take 112 mcg by mouth daily before breakfast.    Niacin (VITAMIN B-3 PO) Take 1 tablet by mouth in the morning.   nitroGLYCERIN (NITROSTAT) 0.4 MG SL tablet Place 1 tablet (0.4 mg total) under the tongue every 5 (five) minutes as needed for chest pain.   pantoprazole (PROTONIX) 40 MG tablet Take 1 tablet (40 mg total) by mouth 2 (two) times daily before a meal.   potassium chloride SA (K-DUR,KLOR-CON) 20 MEQ tablet Take 20 mEq by mouth in the morning.   predniSONE (DELTASONE) 10 MG tablet Take 10 mg by mouth 2 (two) times daily.   valsartan-hydrochlorothiazide (DIOVAN-HCT) 320-25 MG per tablet Take 1 tablet by mouth in the morning.  No current facility-administered medications for this visit. (Other)   REVIEW OF SYSTEMS: ROS   Positive for: Gastrointestinal, Cardiovascular, Eyes Negative for: Constitutional, Neurological, Skin, Genitourinary, Musculoskeletal, HENT, Endocrine, Respiratory, Psychiatric, Allergic/Imm, Heme/Lymph Last edited by Theodore Demark, COA on 11/01/2021  8:21 AM.     ALLERGIES Allergies  Allergen Reactions   Crestor [Rosuvastatin] Swelling and Other (See Comments)    Muscle aches/inflammation   Neurontin [Gabapentin] Swelling    Disoriented/"out of it"   PAST MEDICAL HISTORY Past Medical History:  Diagnosis Date   Anemia    Arm numbness    Cataract    Mixed form OU   Fibromyalgia    GERD (gastroesophageal  reflux disease)    Glucose intolerance (impaired glucose tolerance)    H. pylori infection 01/2012   Amoxicillin, Biaxin   Hyperlipidemia    Hypertension    Hypertensive retinopathy    OU   Hypothyroidism    Mitral valve prolapse    not seen on recent echoes   Rheumatic fever    age 58, was at [facility] for 17 weeks   SSS (sick sinus syndrome) ([place])    1997 ppm, gen change 2020 (MDT)   Past Surgical History:  Procedure Laterality Date   ABDOMINAL HYSTERECTOMY     partial   APPENDECTOMY     BALLOON DILATION N/A 10/18/2021   Procedure: BALLOON DILATION;  Surgeon: Eloise Harman, DO;  [facility];  Service: Endoscopy;  Laterality: N/A;   BIOPSY  10/18/2021   Procedure: BIOPSY;  Surgeon: Eloise Harman, DO;  [facility];  Service: Endoscopy;;   CARDIAC CATHETERIZATION     multiple   CHOLECYSTECTOMY     COLONOSCOPY  08/01/2006   3 mm descending colon polyp removed/8 mm sessile ascending colon polyp removed / 3-mm rectal  polyp removed /Rare sigmoid diverticulosis/ Moderate internal hemorrhoids.Advanced adenoma on colonoscopy in March 2008.  The polyp was anadenomatous polyp with a foci of high-grade dysplasia   COLONOSCOPY  09/08/2008   SIMPLE ADENOMA/HYPERPLASTIC POLY/Multiple colon polyps (ascending, sigmoid, rectal)  Mild sigmoid colon diverticulosis./ Small internal hemorrhoids   COLONOSCOPY N/A 12/11/2017   Dr. Oneida Alar: diverticulosis, hemorrhoids next surveillance tcs 5 years.    COLONOSCOPY WITH ESOPHAGOGASTRODUODENOSCOPY (EGD)  Sept. 30, 2013   YJE:HUDJ diverticulosis was noted in the sigmoid colon/The colon was otherwise normal/Small internal hemorrhoids/EGD:The mucosa of the esophagus appeared normal/Non-erosive gastritis (inflammation) was found; multiple bx/The duodenal mucosa showed no abnormalities. +H.pylori gastritis, treated with equivalent of prevpac. SAVARY DILATION   ESOPHAGEAL DILATION N/A 10/07/2014   Procedure: ESOPHAGEAL DILATION;   Surgeon: Danie Binder, MD;  [facility];  Service: Endoscopy;  Laterality: N/A;   ESOPHAGOGASTRODUODENOSCOPY N/A 10/07/2014   Dr. Oneida Alar: moderate non-erosive gastritis, no definite stricture, empiric dilation . negative H.pylori    ESOPHAGOGASTRODUODENOSCOPY (EGD) WITH PROPOFOL N/A 10/18/2021   Procedure: ESOPHAGOGASTRODUODENOSCOPY (EGD) WITH PROPOFOL;  Surgeon: Eloise Harman, DO;  [facility];  Service: Endoscopy;  Laterality: N/A;  2:30pm   growth removed from intestine     [facility]-as teenager, done through colonoscopy   PACEMAKER GENERATOR CHANGE  09/18/2006   generator change by Dr Leonia Reeves with a MDT [place]  09/13/1995   for sick sinus syndome and syncope at [place] N/A 06/11/2018    Medtronic Azure XT DR MRI SureScan model [id number] (serial number [id number]) pacemaker by Dr Rayann Heman   right Achilles tendon  X 2   TONSILLECTOMY     FAMILY HISTORY Family History  Problem Relation Age of Onset   Hyperlipidemia Mother    Hypertension Mother    Heart disease Father    Hypertension Father    Hyperlipidemia Sister    Hypertension Sister    Colon polyps Sister    Hyperlipidemia Brother    Hypertension Brother    Colon polyps Brother    Colon cancer Neg Hx    SOCIAL HISTORY Social History   Tobacco Use   Smoking status: Former    Packs/day: 0.25    Years: 4.00    Pack years: 1.00    Types: Cigarettes    Quit date: 10/09/1985    Years since quitting: 36.0   Smokeless tobacco: Never   Tobacco comments:    just in teens  Vaping Use   Vaping Use: Never used  Substance Use Topics   Alcohol use: No    Alcohol/week: 0.0 standard drinks   Drug use: No       OPHTHALMIC EXAM:  Base Eye Exam     Visual Acuity (Snellen - Linear)       Right Left   Dist [place] 20/25 -2 20/20 -1   Dist ph [place] 20/20 -1          Tonometry (Tonopen, 8:18 AM)       Right Left   Pressure 12 11          Pupils       Dark Light Shape React APD   Right 4 3 Round Slow None   Left 4 3 Round Slow None         Visual Fields (Counting fingers)       Left Right    Full Full         Extraocular Movement       Right Left    Full, Ortho Full, Ortho         Neuro/Psych     Oriented x3: Yes   Mood/Affect: Normal         Dilation     Both eyes: 1.0% Mydriacyl, 2.5% Phenylephrine @ 8:18 AM           Slit Lamp and Fundus Exam     Slit Lamp Exam       Right Left   Lids/Lashes Mild Dermatochalasis - upper lid Mild Dermatochalasis - upper lid   Conjunctiva/Sclera Nasal Pinguecula, mild Melanosis Nasal Pinguecula, mild Melanosis, white and quiet   Cornea Mild Arcus, well healed cataract wound Mild Arcus, well healed cataract wound   Anterior Chamber deep, narrow temporal angle, no cell/flare deep, narrow temporal angle, No cell or flare   Iris Round and dilated Round and dilated   Lens PC IOL in good position PC IOL in good position   Anterior Vitreous Vitreous syneresis Vitreous syneresis, mild cell/pigment         Fundus Exam       Right Left   Disc Pink and Sharp, central cupping and pallor Pink and Sharp   C/D Ratio 0.75 0.6   Macula Flat, good foveal reflex, mild RPE mottling, no heme or edema Flat, good foveal reflex, mild Retinal pigment epithelial mottling, no heme or edema   Vessels mild attenuation, mild tortuosity mild attenuation, mild tortuosity   Periphery Attached, no heme Attached, no heme, no snowbanking, peripheral pigmented cystoid degeneration inferiory           IMAGING AND PROCEDURES  Imaging  and Procedures for @TODAY @  OCT, Retina - OU - Both Eyes       Right Eye Quality was good. Central Foveal Thickness: 260. Progression has been stable. Findings include no SRF, no IRF, normal foveal contour (No IRF/CME, no vitreous opacities).   Left Eye Quality was good. Central Foveal Thickness: 260. Progression has been stable. Findings  include normal foveal contour, no IRF, no SRF (No IRF/CME, no vitreous opacities).   Notes *Images captured and stored on drive  Diagnosis / Impression:  NFP, no IRF/SRF OS No IRF/CME, no vitreous opacities OU  Clinical management:  See below  Abbreviations: NFP - Normal foveal profile. CME - cystoid macular edema. PED - pigment epithelial detachment. IRF - intraretinal fluid. SRF - subretinal fluid. EZ - ellipsoid zone. ERM - epiretinal membrane. ORA - outer retinal atrophy. ORT - outer retinal tubulation. SRHM - subretinal hyper-reflective material            ASSESSMENT/PLAN:    ICD-10-CM   1. 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 Cystoid macular edema of both eyes  H35.353 OCT, Retina - OU - Both Eyes    3. Uveitis  H20.9     4. Essential hypertension  I10     5. Hypertensive retinopathy of both eyes  H35.033     6. Pseudophakia, both eyes  Z96.1     7. 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 Glaucoma suspect of both eyes  H40.003      1-3. Mild Panuveitis w/ CME OU -- improved / stably resolved tapered off drops  - delayed to follow up from 10.11.21 to 11.29.22  - at initial presentation, pt reported 1+ mo history of floaters and decreased vision OU; +photophobia  - saw [name], who noted [facility] cell and started PF q1h  - initial exam here showed +cell/pigment in [facility] and vitreous cavity; mild vitreous haze and +central CME OU  - FA (02.15.21) showed central petaloid hyperfluorescence and hyperfluorescence of the optic disc  - s/p STK OD (05.25.21)  - pt reports history of fibromyalgia, family history of lupus  - initiated uveitis lab work up -- all WNL except +toxo IgG   CBC, CMP   RPR, VDRL, FTA-Abs, MHA   HIV, Lyme, Quant-Gold   Toxoplasma titers   HLA Panel   ANA   ANCA   ACE, Lysozyme   RF   ESR, CRP   CXR  - chest x-ray without TB or pulmonary sarcoidosis, but did reveal a 1.6cm  nodular mass in right lung base  - hx of Covid-19 infection ?involvement  - repeat FA (08.23.21) showed interval improvement in perifoveal petaloid leakage OU  - OCT shows OD: stable improvement of IRF/CME; OS: Stable resolution of CME OS  - discussed findings  - completed PF and Prolensa taper  - STK informed consent obtained, signed and scanned, 05.25.21  - f/u 9-12 months -- DFE/OCT  4,5. Hypertensive retinopathy OU  - discussed importance of tight BP control  - monitor  6. Pseudophakia OU  - s/p CE/IOL OU (Dr. Lucianne Lei, April and May 2023)  - IOLs in good position, doing well  - monitor  7. 1.6 cm lung nodule of R lung base found on CXR  - following with Pulmonology -- lesion shrinking and no interventions have been recommended  - PET imaging done on 3.11.21 --  identifying some metabolic lesions (see report below)  - CT chest performed 4.14.21 -- indicating mild dec in some dimensions (see report below)  - pt saw [name] who had originally scheduled a bronchoscopy, but was cancelled following the CT evidence of decreasing size on CT  - nodule continues to decrease in size on imaging  8,9. Ocular Hypertension / Glaucoma suspect OU  - IOP 12,11 -- improved  - +family history  - C/D increased OU -- OD 0.75; OS 0.6  - reviewed instructions: Cosopt BID OU and brimonidine TID OU  - under the expert management of Dr. Lucianne Lei  Ophthalmic Meds Ordered this visit:  No orders of the defined types were placed in this encounter.  This document serves as a record of services personally performed by Gardiner Sleeper, MD, PhD. It was created on their behalf by San Jetty. Owens Shark, OA an ophthalmic technician. The creation of this record is the provider's dictation and/or activities during the visit.    Electronically signed by: San Jetty. Owens Shark, [place] 06.05.2023 9:17 AM  Gardiner Sleeper, M.D., Ph.D. Diseases & Surgery of the Retina and Vitreous [facility]  I have reviewed the  above documentation for accuracy and completeness, and I agree with the above. Gardiner Sleeper, M.D., Ph.D. 11/01/21 9:17 AM  Abbreviations: M myopia (nearsighted); A astigmatism; H hyperopia (farsighted); P presbyopia; Mrx spectacle prescription;  CTL contact lenses; OD right eye; OS left eye; OU both eyes  XT exotropia; ET esotropia; PEK punctate epithelial keratitis; PEE punctate epithelial erosions; DES dry eye syndrome; MGD meibomian gland dysfunction; ATs artificial tears; PFAT's preservative free artificial tears; [place] nuclear sclerotic cataract; PSC posterior subcapsular cataract; ERM epi-retinal membrane; PVD posterior vitreous detachment; RD retinal detachment; DM diabetes mellitus; DR diabetic retinopathy; NPDR non-proliferative diabetic retinopathy; PDR proliferative diabetic retinopathy; CSME clinically significant macular edema; DME diabetic macular edema; dbh dot blot hemorrhages; CWS cotton wool spot; POAG primary open angle glaucoma; C/D cup-to-disc ratio; HVF humphrey visual field; GVF goldmann visual field; OCT optical coherence tomography; IOP intraocular pressure; BRVO Branch retinal vein occlusion; CRVO central retinal vein occlusion; CRAO central retinal artery occlusion; BRAO branch retinal artery occlusion; RT retinal tear; SB scleral buckle; PPV pars plana vitrectomy; VH Vitreous hemorrhage; PRP panretinal laser photocoagulation; IVK intravitreal kenalog; VMT vitreomacular traction; MH Macular hole;  NVD neovascularization of the disc; NVE neovascularization elsewhere; AREDS age related eye disease study; ARMD age related macular degeneration; POAG primary open angle glaucoma; EBMD epithelial/anterior basement membrane dystrophy; ACIOL anterior chamber intraocular lens; IOL intraocular lens; PCIOL posterior chamber intraocular lens; Phaco/IOL phacoemulsification with intraocular lens placement; PRK photorefractive keratectomy; LASIK laser assisted in situ keratomileusis; HTN hypertension;  DM diabetes mellitus; COPD chronic obstructive pulmonary disease   IMAGING:  NM PET Image (3.11.21)  FINDINGS: Mediastinal blood pool activity: SUV max 2.5   Liver activity: SUV max 3.5   NECK: No hypermetabolic lymph nodes in the neck.   Incidental CT findings: none   CHEST: Within the RIGHT lower lobe, tight cluster of nodules measures 1.4 x 1.3 cm (image 77/4) has mild to moderate metabolic activity for size (SUV max equal 2.9).   There is hypermetabolic activity associated the subcarinal lymph node which is normal size at 9 mm but fairly intense metabolic activity SUV max equal 5.0. More mild activity associated with a partially calcified small RIGHT hilar node with SUV max equal 3.8.   Incidental CT findings: none   ABDOMEN/PELVIS: No abnormal hypermetabolic activity within  the liver, pancreas, adrenal glands, or spleen. No hypermetabolic lymph nodes in the abdomen or pelvis.   Incidental CT findings: none   SKELETON: No focal hypermetabolic activity to suggest skeletal metastasis.   Incidental CT findings: none   IMPRESSION: 1. Mild-to-moderate metabolic activity associated with irregular RIGHT lobe pulmonary nodule. Hypermetabolic subcarinal lymph node. Findings remain indeterminate for inflammatory nodule and lymphadenopathy versus malignancy. There is evidence of granulomatous disease with calcified RIGHT hilar lymph node. Recommend either short-term CT follow-up (3 months) or tissue sampling. 2. No distant metastatic disease.  CT Chest w/o contrast (4.13.21)  FINDINGS: Cardiovascular: Right-sided dual lead pacer device in place, power pack over the left chest. Leads appear in similar position to the prior exam. Heart size mildly enlarged. No pericardial effusion. The calcified atherosclerotic changes of the thoracic aorta without aneurysmal dilation. Main pulmonary artery is mildly dilated.   Mediastinum/Nodes: Stable mildly enlarged subcarinal and  right paratracheal lymph nodes just at or less than a cm. No hilar adenopathy. No axillary adenopathy. Thoracic inlet structures are normal.   Lungs/Pleura: Irregular area in the right lower lobe anteriorly, in total measuring approximately 1.7 cm by 1.2 cm. Grouped nodules at the periphery. The dominant area measuring approximately 1.3 cm greatest dimension with extension to the pleural surface along the right lateral chest. The lesion abuts the major fissure. On the sagittal and coronal images the lesion appears to have decreased in size compared to the prior study, dominant area measuring 6-7 mm greatest thickness on sagittal image 44 of series 6 as compared to 9-10 mm on the previous study, image 36 of series 7 on the 07/22/2018 exam.   Axial measurements are provided across the greatest extent of the lesion which appears to be less dense in the axial plane within the central portion of the measured area across grouped nodules at the periphery and the dominant nodule more centrally.   On coronal image 78 of series 5 the lesion measures 1.2 cm greatest dimension, previously approximately 1.2 cm with respect to the largest area but measured 0.9 cm greatest thickness.   Airways are patent. No consolidation or pleural effusion.   Upper Abdomen: Incidentally imaged upper abdominal contents with changes of cholecystectomy. Upper abdominal contents are otherwise unremarkable.   Musculoskeletal: No chest wall lesion. No acute bone process. No destructive bone finding.   IMPRESSION: 1. Irregular area in the right lower lobe with dominant nodule in small group peripheral nodules raises the question of infectious etiology given the slight interval decrease in some dimensions compared to the prior study potential short interval follow-up could be helpful to track for interval decrease. Neoplasm at this point not entirely excluded. 2. Stable mildly enlarged subcarinal and right  paratracheal lymph nodes. Below threshold in terms of nodal enlargement for CT. 3. Aortic atherosclerosis.

## 2021-10-29 ENCOUNTER — Encounter: Payer: Medicare Other | Admitting: Internal Medicine

## 2021-11-01 ENCOUNTER — Ambulatory Visit (INDEPENDENT_AMBULATORY_CARE_PROVIDER_SITE_OTHER): Payer: Medicare Other | Admitting: Ophthalmology

## 2021-11-01 ENCOUNTER — Encounter (INDEPENDENT_AMBULATORY_CARE_PROVIDER_SITE_OTHER): Payer: Self-pay | Admitting: Ophthalmology

## 2021-11-01 DIAGNOSIS — H35353 Cystoid macular degeneration, bilateral: Secondary | ICD-10-CM

## 2021-11-01 DIAGNOSIS — H44113 Panuveitis, bilateral: Secondary | ICD-10-CM | POA: Diagnosis not present

## 2021-11-01 DIAGNOSIS — H209 Unspecified iridocyclitis: Secondary | ICD-10-CM | POA: Diagnosis not present

## 2021-11-01 DIAGNOSIS — H25813 Combined forms of age-related cataract, bilateral: Secondary | ICD-10-CM

## 2021-11-01 DIAGNOSIS — H40053 Ocular hypertension, bilateral: Secondary | ICD-10-CM

## 2021-11-01 DIAGNOSIS — Z961 Presence of intraocular lens: Secondary | ICD-10-CM

## 2021-11-01 DIAGNOSIS — I10 Essential (primary) hypertension: Secondary | ICD-10-CM

## 2021-11-01 DIAGNOSIS — H35033 Hypertensive retinopathy, bilateral: Secondary | ICD-10-CM

## 2021-11-01 DIAGNOSIS — H40003 Preglaucoma, unspecified, bilateral: Secondary | ICD-10-CM

## 2021-11-01 DIAGNOSIS — R911 Solitary pulmonary nodule: Secondary | ICD-10-CM

## 2021-11-15 ENCOUNTER — Other Ambulatory Visit: Payer: Self-pay | Admitting: Physician Assistant

## 2021-11-25 DIAGNOSIS — I10 Essential (primary) hypertension: Secondary | ICD-10-CM | POA: Diagnosis not present

## 2021-12-08 ENCOUNTER — Encounter: Payer: Self-pay | Admitting: Gastroenterology

## 2021-12-08 ENCOUNTER — Telehealth: Payer: Self-pay | Admitting: Gastroenterology

## 2021-12-08 ENCOUNTER — Ambulatory Visit: Payer: Medicare Other | Admitting: Gastroenterology

## 2021-12-08 ENCOUNTER — Ambulatory Visit (INDEPENDENT_AMBULATORY_CARE_PROVIDER_SITE_OTHER): Payer: Medicare Other

## 2021-12-08 VITALS — BP 110/72 | HR 82 | Temp 97.5°F | Ht 64.0 in | Wt 200.6 lb

## 2021-12-08 DIAGNOSIS — I6300 Cerebral infarction due to thrombosis of unspecified precerebral artery: Secondary | ICD-10-CM | POA: Diagnosis not present

## 2021-12-08 DIAGNOSIS — K219 Gastro-esophageal reflux disease without esophagitis: Secondary | ICD-10-CM | POA: Diagnosis not present

## 2021-12-08 DIAGNOSIS — R1011 Right upper quadrant pain: Secondary | ICD-10-CM

## 2021-12-08 MED ORDER — SUCRALFATE 1 GM/10ML PO SUSP: 1.0000 g | Freq: Three times a day (TID) | ORAL | 0 refills | Status: DC

## 2021-12-08 NOTE — Patient Instructions (Signed)
Continue pantoprazole 40 mg twice daily before meals. For short-term, we will add Carafate before meals and at bedtime to see if this helps with your abdominal discomfort.  Prescription sent to your pharmacy.  After 1 month, I would use 4 times daily as needed only. Add probiotic, Restora samples provided today, take 1 daily for 2 weeks. Call in a couple of weeks and let me know if you are not feeling better.  We will make a follow-up appointment for 2 months but if you are feeling well at that time you can call and cancel. Colonoscopy in 2024. We will obtain copy of recent labs for review.

## 2021-12-08 NOTE — Progress Notes (Signed)
Labs reviewed from PCP but these were dated in April.  Patient states she had some last week.  We will request again.  Labs from August 30, 2021: White blood cell count 5500, hemoglobin 14.1, platelets 261,000, BUN 12, creatinine 0.9, total bilirubin 0.4, alkaline phosphatase 89, AST 20, ALT 19, TSH 0.91.

## 2021-12-08 NOTE — Progress Notes (Signed)
GI Office Note    Referring Provider: Ignatius Specking, MD Primary Care Physician:  Ignatius Specking, MD  Primary Gastroenterologist:  Chief Complaint   Chief Complaint  Patient presents with   Abdominal Pain    Feels discomfort he right side. Periodic issues with vomiting and the need to have a bm after meals. Started after having biopsy.     History of Present Illness   Sylvia Anthony is a 66 y.o. female presenting today for follow-up.  Last seen in April 2023.  She has a history of chronic GERD, esophageal dysphagia.  History of adenomatous colon polyps due for surveillance exam next year.  EGD completed in 09/2021 due to history of dysphagia. She was found to have gastritis, small hiatal hernia, normal esophagus.  Pathology showed inflammation but no H. pylori.  She has a history of H. pylori in 2013 completed treatment.  EGD in 2016 and 2023 with negative biopsies.  Patient states her swallowing is much better since having her esophagus stretched.  Continues to have occasional heartburn related to trigger foods.  Continues to have bowel movement most days, some postprandial stools.  Complains of new symptoms since her endoscopy.  She has an empty feeling/discomfort in the right upper quadrant/epigastric region.  Feels like hunger pain.  She has had some postprandial vomiting especially at night if she eats after 7 PM.  She typically waits 3 to 4 hours before laying down but certain foods she still can have vomiting and she feels this is reflux related.  She denies any melena or rectal bleeding.  She is tried antacids for these symptoms but no improvement.  She takes ibuprofen every night for fibromyalgia but rarely takes Voltaren.      EGD May 2023: -normal esophagus s/p dilation for h/o dysphagia  -Small hiatal hernia. - Gastritis. Biopsied. - Normal duodenal bulb, first portion of the duodenum and second portion of the duodenum  FINAL MICROSCOPIC DIAGNOSIS:   A. STOMACH,  BIOPSY:  -  2 fragments of antralized oxyntic mucosa with intestinal metaplasia  suggestive of atrophy.  -  Separate fragments of oxyntic mucosa with mild chronic inactive  gastritis.  -  An immunohistochemical stain for Helicobacter pylori organisms is  pending and will be reported in an addendum  -H. pylori immunohistochemistry is NEGATIVE for microorganisms.    Medications   Current Outpatient Medications  Medication Sig Dispense Refill   albuterol (PROVENTIL HFA;VENTOLIN HFA) 108 (90 BASE) MCG/ACT inhaler Inhale 2 puffs into the lungs every 6 (six) hours as needed for wheezing or shortness of breath.     amLODipine (NORVASC) 10 MG tablet Take 10 mg by mouth daily.     aspirin EC 81 MG tablet Take 1 tablet (81 mg total) by mouth daily. 90 tablet 3   brimonidine (ALPHAGAN) 0.2 % ophthalmic solution Place 1 drop into both eyes 3 (three) times daily.     carvedilol (COREG) 6.25 MG tablet TAKE ONE TABLET BY MOUTH TWICE DAILY 180 tablet 2   cetirizine (ZYRTEC) 10 MG tablet Take 10 mg by mouth at bedtime as needed for allergies.      COSOPT 22.3-6.8 MG/ML ophthalmic solution Place 1 drop into both eyes 2 (two) times daily.     diclofenac (VOLTAREN) 75 MG EC tablet Take 75 mg by mouth daily as needed for mild pain.     ibuprofen (ADVIL) 200 MG tablet Take 400 mg by mouth every 8 (eight) hours as needed (pain.).  levothyroxine (SYNTHROID, LEVOTHROID) 112 MCG tablet Take 112 mcg by mouth daily before breakfast.      Niacin (VITAMIN B-3 PO) Take 1 tablet by mouth in the morning.     nitroGLYCERIN (NITROSTAT) 0.4 MG SL tablet Place 1 tablet (0.4 mg total) under the tongue every 5 (five) minutes as needed for chest pain. 25 tablet 3   pantoprazole (PROTONIX) 40 MG tablet Take 1 tablet (40 mg total) by mouth 2 (two) times daily before a meal. 60 tablet 5   potassium chloride SA (K-DUR,KLOR-CON) 20 MEQ tablet Take 20 mEq by mouth in the morning.     valsartan-hydrochlorothiazide (DIOVAN-HCT)  320-25 MG per tablet Take 1 tablet by mouth in the morning.     No current facility-administered medications for this visit.    Allergies   Allergies as of 12/08/2021 - Review Complete 12/08/2021  Allergen Reaction Noted   Crestor [rosuvastatin] Swelling and Other (See Comments) 10/15/2021   Neurontin [gabapentin] Swelling 12/23/2016       Review of Systems   General: Negative for anorexia, weight loss, fever, chills, fatigue, weakness. ENT: Negative for hoarseness, difficulty swallowing , nasal congestion. CV: Negative for chest pain, angina, palpitations, dyspnea on exertion, peripheral edema.  Respiratory: Negative for dyspnea at rest, dyspnea on exertion, cough, sputum, wheezing.  GI: See history of present illness. GU:  Negative for dysuria, hematuria, urinary incontinence, urinary frequency, nocturnal urination.  Endo: Negative for unusual weight change.     Physical Exam   BP 110/72 (BP Location: Left Arm, Patient Position: Sitting, Cuff Size: Large)   Pulse 82   Temp (!) 97.5 F (36.4 C) (Temporal)   Ht 5\' 4"  (1.626 m)   Wt 200 lb 9.6 oz (91 kg)   SpO2 95%   BMI 34.43 kg/m    General: Well-nourished, well-developed in no acute distress.  Eyes: No icterus. Mouth: Oropharyngeal mucosa moist and pink , no lesions erythema or exudate. Abdomen: Bowel sounds are normal, nontender, nondistended, no hepatosplenomegaly or masses,  no abdominal bruits or hernia , no rebound or guarding.  Rectal: Not performed Extremities: No lower extremity edema. No clubbing or deformities. Neuro: Alert and oriented x 4   Skin: Warm and dry, no jaundice.   Psych: Alert and cooperative, normal mood and affect.  Labs   Lab Results  Component Value Date   CREATININE 0.88 10/12/2021   BUN 10 10/12/2021   NA 140 10/12/2021   K 2.9 (L) 10/12/2021   CL 106 10/12/2021   CO2 28 10/12/2021   Lab Results  Component Value Date   WBC 6.6 10/12/2021   HGB 14.3 10/12/2021   HCT 44.7  10/12/2021   MCV 86.0 10/12/2021   PLT 265 10/12/2021    Imaging Studies   No results found.  Assessment   Dysphagia: Much improved status post esophageal dilation.  GERD: Likely cause of nighttime vomiting after she lays down.  She is allowing 3 to 4 hours before laying down after meals but certain foods tend to bother her.  Overall no typical heartburn in the setting of pantoprazole twice daily.  Complains of new symptoms post EGD.  Complains of pain in the upper abdomen like hunger pains.  Has had some vomiting after meals during the day.  PCP obtained recent labs.  We have requested.  Etiology of new symptoms unclear.  She does not have a gallbladder.  We will add Carafate short-term for history of gastritis and probiotic to see if this helps any of  these vague symptoms.   PLAN   Continue pantoprazole 40mg  BID before meals. Add carafate 1 gram before meals and at bedtime for next few weeks with goal of stopping at that time and use only as needed. Add probiotic, samples of restora provided today. Call with a progress report in the next couple of weeks if she is not feeling better.  We will make a follow-up appointment for 2 months in case needed, if she is doing well she can call and cancel. Obtain copy of recent labs by PCP for review. Plan for colonoscopy next year.    . Leanna Battles, MHS, PA-C [facility]

## 2021-12-08 NOTE — Telephone Encounter (Signed)
Sylvia Anthony, patient states she had labs last week by Dr. Sherril Croon, they may not be ready to send out to [place] yet but can we request copy of labs next week

## 2021-12-09 LAB — CUP PACEART REMOTE DEVICE CHECK
Battery Remaining Longevity: 129 mo
Battery Voltage: 3.02 V
Brady Statistic AP VP Percent: 0.05 %
Brady Statistic AP VS Percent: 87.24 %
Brady Statistic AS VP Percent: 0.01 %
Brady Statistic AS VS Percent: 12.71 %
Brady Statistic RA Percent Paced: 87.69 %
Brady Statistic RV Percent Paced: 0.05 %
Date Time Interrogation Session: 20230712032553
Implantable Lead Implant Date: 19970416
Implantable Lead Implant Date: 19970416
Implantable Lead Location: 753859
Implantable Lead Location: 753860
Implantable Lead Model: 5034
Implantable Lead Model: 5534
Implantable Pulse Generator Implant Date: 20200113
Lead Channel Impedance Value: 646 Ohm
Lead Channel Impedance Value: 646 Ohm
Lead Channel Impedance Value: 684 Ohm
Lead Channel Impedance Value: 722 Ohm
Lead Channel Pacing Threshold Amplitude: 0.5 V
Lead Channel Pacing Threshold Amplitude: 1 V
Lead Channel Pacing Threshold Pulse Width: 0.4 ms
Lead Channel Pacing Threshold Pulse Width: 0.4 ms
Lead Channel Sensing Intrinsic Amplitude: 1.875 mV
Lead Channel Sensing Intrinsic Amplitude: 1.875 mV
Lead Channel Sensing Intrinsic Amplitude: 18.875 mV
Lead Channel Sensing Intrinsic Amplitude: 18.875 mV
Lead Channel Setting Pacing Amplitude: 1.5 V
Lead Channel Setting Pacing Amplitude: 2.5 V
Lead Channel Setting Pacing Pulse Width: 0.4 ms
Lead Channel Setting Sensing Sensitivity: 2 mV

## 2021-12-20 ENCOUNTER — Telehealth: Payer: Self-pay | Admitting: Gastroenterology

## 2021-12-20 NOTE — Telephone Encounter (Signed)
PLEASE CALL PATIENT ABOUT HER PRESCRIPTION THAT WAS SUPPOSED TO BE CALLED IN

## 2021-12-20 NOTE — Telephone Encounter (Signed)
Informed pt that medication was [facility] on 12/08/21 and for her to contact them regarding status. Pt verbalized understanding.

## 2021-12-27 DIAGNOSIS — I10 Essential (primary) hypertension: Secondary | ICD-10-CM | POA: Diagnosis not present

## 2021-12-28 NOTE — Progress Notes (Signed)
Remote pacemaker transmission.   

## 2022-01-03 DIAGNOSIS — I10 Essential (primary) hypertension: Secondary | ICD-10-CM | POA: Diagnosis not present

## 2022-01-03 DIAGNOSIS — R52 Pain, unspecified: Secondary | ICD-10-CM | POA: Diagnosis not present

## 2022-01-03 DIAGNOSIS — I495 Sick sinus syndrome: Secondary | ICD-10-CM | POA: Diagnosis not present

## 2022-01-03 DIAGNOSIS — M25562 Pain in left knee: Secondary | ICD-10-CM | POA: Diagnosis not present

## 2022-01-03 DIAGNOSIS — M1712 Unilateral primary osteoarthritis, left knee: Secondary | ICD-10-CM | POA: Diagnosis not present

## 2022-01-03 DIAGNOSIS — I739 Peripheral vascular disease, unspecified: Secondary | ICD-10-CM | POA: Diagnosis not present

## 2022-01-03 DIAGNOSIS — Z299 Encounter for prophylactic measures, unspecified: Secondary | ICD-10-CM | POA: Diagnosis not present

## 2022-01-18 NOTE — Progress Notes (Signed)
Remote reviewed. Battery status noted.  Leads function stable

## 2022-01-26 DIAGNOSIS — I10 Essential (primary) hypertension: Secondary | ICD-10-CM | POA: Diagnosis not present

## 2022-02-01 ENCOUNTER — Other Ambulatory Visit: Payer: Self-pay | Admitting: Gastroenterology

## 2022-02-02 NOTE — Telephone Encounter (Signed)
Received labs from 08/2021, nothing more recent. Patient with upcoming ov.

## 2022-02-08 ENCOUNTER — Ambulatory Visit: Payer: Medicare Other | Admitting: Gastroenterology

## 2022-02-14 NOTE — Progress Notes (Signed)
[facility] Note  02/15/2022    CHIEF COMPLAINT Patient presents for Retina Evaluation  HISTORY OF PRESENT ILLNESS: Sylvia Anthony is a 66 y.o. female who presents to the clinic today for:   HPI     Retina Evaluation   In left eye.  This started 2 weeks ago.  Duration of 2 weeks.  I, the attending physician,  performed the HPI with the patient and updated documentation appropriately.        Comments   Patient here for Retina Evaluation. Patient states vision OS is blurry. Had cataract surgery OU in May. Started 2 weeks ago having blurred vision. No eye pain. Has to squint.      Last edited by [name], MD on 02/15/2022 10:52 PM.     Pt states about 2 weeks ago, her left eye vision started to decrease, she states it is worse this week than it was then, pt states she is on brimonidine and Cosopt  Referring physician: Glenda Chroman, MD [place],  [place] [address]  HISTORICAL INFORMATION:   Selected notes from the MEDICAL RECORD NUMBER Referred by [name] for concern of bilateral vitritis.   CURRENT MEDICATIONS: Current Outpatient Medications (Ophthalmic Drugs)  Medication Sig   brimonidine (ALPHAGAN) 0.2 % ophthalmic solution Place 1 drop into both eyes 3 (three) times daily.   Bromfenac Sodium (PROLENSA) 0.07 % SOLN Place 1 drop into the left eye in the morning, at noon, in the evening, and at bedtime.   COSOPT 22.3-6.8 MG/ML ophthalmic solution Place 1 drop into both eyes 2 (two) times daily.   prednisoLONE acetate (PRED FORTE) 1 % ophthalmic suspension Place 1 drop into the left eye 4 (four) times daily.   No current facility-administered medications for this visit. (Ophthalmic Drugs)   Current Outpatient Medications (Other)  Medication Sig   albuterol (PROVENTIL HFA;VENTOLIN HFA) 108 (90 BASE) MCG/ACT inhaler Inhale 2 puffs into the lungs every 6 (six) hours as needed for wheezing or shortness of breath.   amLODipine  (NORVASC) 10 MG tablet Take 10 mg by mouth daily.   aspirin EC 81 MG tablet Take 1 tablet (81 mg total) by mouth daily.   carvedilol (COREG) 6.25 MG tablet TAKE ONE TABLET BY MOUTH TWICE DAILY   cetirizine (ZYRTEC) 10 MG tablet Take 10 mg by mouth at bedtime as needed for allergies.    diclofenac (VOLTAREN) 75 MG EC tablet Take 75 mg by mouth daily as needed for mild pain.   ibuprofen (ADVIL) 200 MG tablet Take 400 mg by mouth every 8 (eight) hours as needed (pain.).   levothyroxine (SYNTHROID, LEVOTHROID) 112 MCG tablet Take 112 mcg by mouth daily before breakfast.    Niacin (VITAMIN B-3 PO) Take 1 tablet by mouth in the morning.   nitroGLYCERIN (NITROSTAT) 0.4 MG SL tablet Place 1 tablet (0.4 mg total) under the tongue every 5 (five) minutes as needed for chest pain.   pantoprazole (PROTONIX) 40 MG tablet Take 1 tablet (40 mg total) by mouth 2 (two) times daily before a meal.   potassium chloride SA (K-DUR,KLOR-CON) 20 MEQ tablet Take 20 mEq by mouth in the morning.   sucralfate (CARAFATE) 1 GM/10ML suspension Take 10 mLs (1 g total) by mouth 4 (four) times daily -  with meals and at bedtime.   valsartan-hydrochlorothiazide (DIOVAN-HCT) 320-25 MG per tablet Take 1 tablet by mouth in the morning.   No current facility-administered medications for this visit. (Other)  REVIEW OF SYSTEMS: ROS   Positive for: Gastrointestinal, Cardiovascular, Eyes Negative for: Constitutional, Neurological, Skin, Genitourinary, Musculoskeletal, HENT, Endocrine, Respiratory, Psychiatric, Allergic/Imm, Heme/Lymph Last edited by Theodore Demark, COA on 02/15/2022  1:18 PM.     ALLERGIES Allergies  Allergen Reactions   Crestor [Rosuvastatin] Swelling and Other (See Comments)    Muscle aches/inflammation   Neurontin [Gabapentin] Swelling    Disoriented/"out of it"   PAST MEDICAL HISTORY Past Medical History:  Diagnosis Date   Anemia    Arm numbness    Cataract    Mixed form OU   Fibromyalgia    GERD  (gastroesophageal reflux disease)    Glucose intolerance (impaired glucose tolerance)    H. pylori infection 01/2012   Amoxicillin, Biaxin   Hyperlipidemia    Hypertension    Hypertensive retinopathy    OU   Hypothyroidism    Mitral valve prolapse    not seen on recent echoes   Rheumatic fever    age 42, was at [facility] for 17 weeks   SSS (sick sinus syndrome) ([place])    1997 ppm, gen change 2020 (MDT)   Past Surgical History:  Procedure Laterality Date   ABDOMINAL HYSTERECTOMY     partial   APPENDECTOMY     BALLOON DILATION N/A 10/18/2021   Procedure: BALLOON DILATION;  Surgeon: Eloise Harman, DO;  [facility];  Service: Endoscopy;  Laterality: N/A;   BIOPSY  10/18/2021   Procedure: BIOPSY;  Surgeon: Eloise Harman, DO;  [facility];  Service: Endoscopy;;   CARDIAC CATHETERIZATION     multiple   CHOLECYSTECTOMY     COLONOSCOPY  08/01/2006   3 mm descending colon polyp removed/8 mm sessile ascending colon polyp removed / 3-mm rectal  polyp removed /Rare sigmoid diverticulosis/ Moderate internal hemorrhoids.Advanced adenoma on colonoscopy in March 2008.  The polyp was anadenomatous polyp with a foci of high-grade dysplasia   COLONOSCOPY  09/08/2008   SIMPLE ADENOMA/HYPERPLASTIC POLY/Multiple colon polyps (ascending, sigmoid, rectal)  Mild sigmoid colon diverticulosis./ Small internal hemorrhoids   COLONOSCOPY N/A 12/11/2017   Dr. Oneida Alar: diverticulosis, hemorrhoids next surveillance tcs 5 years.    COLONOSCOPY WITH ESOPHAGOGASTRODUODENOSCOPY (EGD)  Sept. 30, 2013   CZY:SAYT diverticulosis was noted in the sigmoid colon/The colon was otherwise normal/Small internal hemorrhoids/EGD:The mucosa of the esophagus appeared normal/Non-erosive gastritis (inflammation) was found; multiple bx/The duodenal mucosa showed no abnormalities. +H.pylori gastritis, treated with equivalent of prevpac. SAVARY DILATION   ESOPHAGEAL DILATION N/A 10/07/2014   Procedure:  ESOPHAGEAL DILATION;  Surgeon: Danie Binder, MD;  [facility];  Service: Endoscopy;  Laterality: N/A;   ESOPHAGOGASTRODUODENOSCOPY N/A 10/07/2014   Dr. Oneida Alar: moderate non-erosive gastritis, no definite stricture, empiric dilation . negative H.pylori    ESOPHAGOGASTRODUODENOSCOPY (EGD) WITH PROPOFOL N/A 10/18/2021   Procedure: ESOPHAGOGASTRODUODENOSCOPY (EGD) WITH PROPOFOL;  Surgeon: Eloise Harman, DO;  [facility];  Service: Endoscopy;  Laterality: N/A;  2:30pm   growth removed from intestine     [facility]-as teenager, done through colonoscopy   PACEMAKER GENERATOR CHANGE  09/18/2006   generator change by Dr Leonia Reeves with a MDT [place]  09/13/1995   for sick sinus syndome and syncope at [place] N/A 06/11/2018    Medtronic Azure XT DR MRI SureScan model [id number] (serial number [id number]) pacemaker by Dr Rayann Heman   right Achilles tendon     X 2   TONSILLECTOMY  FAMILY HISTORY Family History  Problem Relation Age of Onset   Hyperlipidemia Mother    Hypertension Mother    Heart disease Father    Hypertension Father    Hyperlipidemia Sister    Hypertension Sister    Colon polyps Sister    Hyperlipidemia Brother    Hypertension Brother    Colon polyps Brother    Colon cancer Neg Hx    SOCIAL HISTORY Social History   Tobacco Use   Smoking status: Former    Packs/day: 0.25    Years: 4.00    Total pack years: 1.00    Types: Cigarettes    Quit date: 10/09/1985    Years since quitting: 36.3   Smokeless tobacco: Never   Tobacco comments:    just in teens  Vaping Use   Vaping Use: Never used  Substance Use Topics   Alcohol use: No    Alcohol/week: 0.0 standard drinks of alcohol   Drug use: No       OPHTHALMIC EXAM:  Base Eye Exam     Visual Acuity (Snellen - Linear)       Right Left   Dist [place] 20/25 -2 20/40 -2   Dist ph [place] 20/20 -2 NI         Tonometry (Tonopen, 1:15 PM)        Right Left   Pressure 17 15         Pupils       Dark Light Shape React APD   Right 3 2 Round Brisk None   Left 3 2 Round Brisk None         Visual Fields (Counting fingers)       Left Right    Full Full         Extraocular Movement       Right Left    Full, Ortho Full, Ortho         Neuro/Psych     Oriented x3: Yes   Mood/Affect: Normal         Dilation     Both eyes: 1.0% Mydriacyl, 2.5% Phenylephrine @ 1:14 PM           Slit Lamp and Fundus Exam     Slit Lamp Exam       Right Left   Lids/Lashes Mild Dermatochalasis - upper lid Mild Dermatochalasis - upper lid   Conjunctiva/Sclera Nasal Pinguecula, mild Melanosis Nasal Pinguecula, mild Melanosis   Cornea Mild Arcus, well healed cataract wound, trace PEE Mild Arcus, well healed cataract wound, 1-2+ Punctate epithelial erosions, tear film debris   Anterior Chamber deep, clear, narrow temporal angle, no cell/flare deep, narrow temporal angle, 2+fine cell/pigment   Iris Round and dilated Round and dilated   Lens PC IOL in good position PC IOL in good position   Anterior Vitreous Vitreous syneresis, +cell/pigment Vitreous syneresis, +cell/pigment         Fundus Exam       Right Left   Disc Pink and Sharp, central cupping and pallor Pink and Sharp   C/D Ratio 0.75 0.6   Macula Flat, good foveal reflex, mild RPE mottling, no heme or edema Flat, blunted foveal reflex, central CME, mild Retinal pigment epithelial mottling, no heme   Vessels attenuated, Tortuous attenuated, Tortuous   Periphery Attached, no heme Attached, no heme, no snowbanking, peripheral pigmented cystoid degeneration inferiory           Refraction     Manifest Refraction  Sphere Cylinder Axis Dist VA   Right       Left [place] +0.50 165 20/40-1           IMAGING AND PROCEDURES  Imaging and Procedures for @TODAY @  OCT, Retina - OU - Both Eyes       Right Eye Quality was good. Central Foveal Thickness:  263. Progression has been stable. Findings include normal foveal contour, no IRF, no SRF (No IRF/CME).   Left Eye Quality was good. Central Foveal Thickness: 665. Progression has worsened. Findings include abnormal foveal contour, intraretinal fluid, subretinal fluid (Interval development of central edema with +IRF/SRF).   Notes *Images captured and stored on drive  Diagnosis / Impression:  OD: No IRF/CME OS: Interval development of central edema with +IRF/SRF  Clinical management:  See below  Abbreviations: NFP - Normal foveal profile. CME - cystoid macular edema. PED - pigment epithelial detachment. IRF - intraretinal fluid. SRF - subretinal fluid. EZ - ellipsoid zone. ERM - epiretinal membrane. ORA - outer retinal atrophy. ORT - outer retinal tubulation. 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 Cystoid macular edema of both eyes  H35.353 OCT, Retina - OU - Both Eyes    3. Uveitis  H20.9     4. Essential hypertension  I10     5. Hypertensive retinopathy of both eyes  H35.033     6. Pseudophakia, both eyes  Z96.1     7. 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 Glaucoma suspect of both eyes  H40.003      1-3. Mild Panuveitis w/ CME OU  - recurrent CME OS likely related to semi-recent cataract sx (April/May 2023); OD without CME  - delayed to follow up from 10.11.21 to 11.29.22  - at initial presentation, pt reported 1+ mo history of floaters and decreased vision OU; +photophobia  - saw [name], who noted [facility] cell and started PF q1h  - initial exam here showed +cell/pigment in [facility] and vitreous cavity; mild vitreous haze and +central CME OU  - FA (02.15.21) showed central petaloid hyperfluorescence and hyperfluorescence of the optic disc  - s/p STK OD (05.25.21)  - pt reports history of fibromyalgia, family history of lupus  - initiated  uveitis lab work up -- all WNL except +toxo IgG   CBC, CMP   RPR, VDRL, FTA-Abs, MHA   HIV, Lyme, Quant-Gold   Toxoplasma titers   HLA Panel   ANA   ANCA   ACE, Lysozyme   RF   ESR, CRP   CXR  - chest x-ray without TB or pulmonary sarcoidosis, but did reveal a 1.6cm nodular mass in right lung base  - hx of Covid-19 infection ?involvement  - repeat FA (08.23.21) showed interval improvement in perifoveal petaloid leakage OU  - OCT shows Interval development of central edema with +IRF/SRF OS  - discussed findings  - re-start PF and Prolensa QID OS  - f/u 3-4 weeks -- DFE/OCT  4,5. Hypertensive retinopathy OU  - discussed importance of tight BP control  - monitor  6. Pseudophakia OU  - s/p CE/IOL OU (Dr. Lucianne Lei, April and May 2023)  - IOLs in good position, doing well  - monitor  7. 1.6 cm lung nodule of R lung base found  on CXR  - following with Pulmonology -- lesion shrinking and no interventions have been recommended  - PET imaging done on 3.11.21 -- identifying some metabolic lesions (see report below)  - CT chest performed 4.14.21 -- indicating mild dec in some dimensions (see report below)  - pt saw [name] who had originally scheduled a bronchoscopy, but was cancelled following the CT evidence of decreasing size on CT  - nodule continues to decrease in size on imaging  8,9. Ocular Hypertension / Glaucoma suspect OU  - IOP 17,15  - +family history  - C/D increased OU -- OD 0.75; OS 0.6  - reviewed instructions: Cosopt BID OU and brimonidine TID OU  - under the expert management of Dr. Lucianne Lei  Ophthalmic Meds Ordered this visit:  Meds ordered this encounter  Medications   prednisoLONE acetate (PRED FORTE) 1 % ophthalmic suspension    Sig: Place 1 drop into the left eye 4 (four) times daily.    Dispense:  15 mL    Refill:  0   Bromfenac Sodium (PROLENSA) 0.07 % SOLN    Sig: Place 1 drop into the left eye in the morning, at noon, in the evening, and at bedtime.     Dispense:  6 mL    Refill:  6   This document serves as a record of services personally performed by Gardiner Sleeper, MD, PhD. It was created on their behalf by San Jetty. Owens Shark, OA an ophthalmic technician. The creation of this record is the provider's dictation and/or activities during the visit.    Electronically signed by: San Jetty. Marguerita Merles 09.18.2023 10:54 PM  Gardiner Sleeper, M.D., Ph.D. Diseases & Surgery of the Retina and Vitreous [facility]  I have reviewed the above documentation for accuracy and completeness, and I agree with the above. Gardiner Sleeper, M.D., Ph.D. 02/15/22 10:56 PM   Abbreviations: M myopia (nearsighted); A astigmatism; H hyperopia (farsighted); P presbyopia; Mrx spectacle prescription;  CTL contact lenses; OD right eye; OS left eye; OU both eyes  XT exotropia; ET esotropia; PEK punctate epithelial keratitis; PEE punctate epithelial erosions; DES dry eye syndrome; MGD meibomian gland dysfunction; ATs artificial tears; PFAT's preservative free artificial tears; [place] nuclear sclerotic cataract; PSC posterior subcapsular cataract; ERM epi-retinal membrane; PVD posterior vitreous detachment; RD retinal detachment; DM diabetes mellitus; DR diabetic retinopathy; NPDR non-proliferative diabetic retinopathy; PDR proliferative diabetic retinopathy; CSME clinically significant macular edema; DME diabetic macular edema; dbh dot blot hemorrhages; CWS cotton wool spot; POAG primary open angle glaucoma; C/D cup-to-disc ratio; HVF humphrey visual field; GVF goldmann visual field; OCT optical coherence tomography; IOP intraocular pressure; BRVO Branch retinal vein occlusion; CRVO central retinal vein occlusion; CRAO central retinal artery occlusion; BRAO branch retinal artery occlusion; RT retinal tear; SB scleral buckle; PPV pars plana vitrectomy; VH Vitreous hemorrhage; PRP panretinal laser photocoagulation; IVK intravitreal kenalog; VMT vitreomacular traction;  MH Macular hole;  NVD neovascularization of the disc; NVE neovascularization elsewhere; AREDS age related eye disease study; ARMD age related macular degeneration; POAG primary open angle glaucoma; EBMD epithelial/anterior basement membrane dystrophy; ACIOL anterior chamber intraocular lens; IOL intraocular lens; PCIOL posterior chamber intraocular lens; Phaco/IOL phacoemulsification with intraocular lens placement; [place] photorefractive keratectomy; LASIK laser assisted in situ keratomileusis; HTN hypertension; DM diabetes mellitus; COPD chronic obstructive pulmonary disease

## 2022-02-15 ENCOUNTER — Encounter (INDEPENDENT_AMBULATORY_CARE_PROVIDER_SITE_OTHER): Payer: Self-pay | Admitting: Ophthalmology

## 2022-02-15 ENCOUNTER — Ambulatory Visit (INDEPENDENT_AMBULATORY_CARE_PROVIDER_SITE_OTHER): Payer: Medicare Other | Admitting: Ophthalmology

## 2022-02-15 DIAGNOSIS — H35353 Cystoid macular degeneration, bilateral: Secondary | ICD-10-CM

## 2022-02-15 DIAGNOSIS — I10 Essential (primary) hypertension: Secondary | ICD-10-CM | POA: Diagnosis not present

## 2022-02-15 DIAGNOSIS — Z961 Presence of intraocular lens: Secondary | ICD-10-CM | POA: Diagnosis not present

## 2022-02-15 DIAGNOSIS — H44113 Panuveitis, bilateral: Secondary | ICD-10-CM

## 2022-02-15 DIAGNOSIS — H40003 Preglaucoma, unspecified, bilateral: Secondary | ICD-10-CM | POA: Diagnosis not present

## 2022-02-15 DIAGNOSIS — R911 Solitary pulmonary nodule: Secondary | ICD-10-CM

## 2022-02-15 DIAGNOSIS — H40053 Ocular hypertension, bilateral: Secondary | ICD-10-CM | POA: Diagnosis not present

## 2022-02-15 DIAGNOSIS — H35033 Hypertensive retinopathy, bilateral: Secondary | ICD-10-CM | POA: Diagnosis not present

## 2022-02-15 DIAGNOSIS — H209 Unspecified iridocyclitis: Secondary | ICD-10-CM

## 2022-02-15 MED ORDER — PROLENSA 0.07 % OP SOLN: 1.0000 [drp] | Freq: Four times a day (QID) | OPHTHALMIC | 6 refills | Status: DC

## 2022-02-15 MED ORDER — PREDNISOLONE ACETATE 1 % OP SUSP: 1.0000 [drp] | Freq: Four times a day (QID) | OPHTHALMIC | 0 refills | Status: DC

## 2022-02-23 DIAGNOSIS — R921 Mammographic calcification found on diagnostic imaging of breast: Secondary | ICD-10-CM | POA: Diagnosis not present

## 2022-02-25 DIAGNOSIS — I10 Essential (primary) hypertension: Secondary | ICD-10-CM | POA: Diagnosis not present

## 2022-03-07 ENCOUNTER — Encounter (INDEPENDENT_AMBULATORY_CARE_PROVIDER_SITE_OTHER): Payer: Medicare Other | Admitting: Ophthalmology

## 2022-03-07 DIAGNOSIS — H44113 Panuveitis, bilateral: Secondary | ICD-10-CM

## 2022-03-07 DIAGNOSIS — Z961 Presence of intraocular lens: Secondary | ICD-10-CM

## 2022-03-07 DIAGNOSIS — H40053 Ocular hypertension, bilateral: Secondary | ICD-10-CM

## 2022-03-07 DIAGNOSIS — I10 Essential (primary) hypertension: Secondary | ICD-10-CM

## 2022-03-07 DIAGNOSIS — H209 Unspecified iridocyclitis: Secondary | ICD-10-CM

## 2022-03-07 DIAGNOSIS — H35033 Hypertensive retinopathy, bilateral: Secondary | ICD-10-CM

## 2022-03-07 DIAGNOSIS — H40003 Preglaucoma, unspecified, bilateral: Secondary | ICD-10-CM

## 2022-03-07 DIAGNOSIS — H35353 Cystoid macular degeneration, bilateral: Secondary | ICD-10-CM

## 2022-03-07 DIAGNOSIS — R911 Solitary pulmonary nodule: Secondary | ICD-10-CM

## 2022-03-08 ENCOUNTER — Encounter (INDEPENDENT_AMBULATORY_CARE_PROVIDER_SITE_OTHER): Payer: Self-pay | Admitting: Ophthalmology

## 2022-03-08 ENCOUNTER — Ambulatory Visit (INDEPENDENT_AMBULATORY_CARE_PROVIDER_SITE_OTHER): Payer: Medicare Other | Admitting: Ophthalmology

## 2022-03-08 DIAGNOSIS — H44113 Panuveitis, bilateral: Secondary | ICD-10-CM

## 2022-03-08 DIAGNOSIS — H35353 Cystoid macular degeneration, bilateral: Secondary | ICD-10-CM

## 2022-03-08 DIAGNOSIS — I10 Essential (primary) hypertension: Secondary | ICD-10-CM

## 2022-03-08 DIAGNOSIS — H40003 Preglaucoma, unspecified, bilateral: Secondary | ICD-10-CM

## 2022-03-08 DIAGNOSIS — R911 Solitary pulmonary nodule: Secondary | ICD-10-CM

## 2022-03-08 DIAGNOSIS — H35033 Hypertensive retinopathy, bilateral: Secondary | ICD-10-CM

## 2022-03-08 DIAGNOSIS — H40053 Ocular hypertension, bilateral: Secondary | ICD-10-CM

## 2022-03-08 DIAGNOSIS — H209 Unspecified iridocyclitis: Secondary | ICD-10-CM

## 2022-03-08 DIAGNOSIS — Z961 Presence of intraocular lens: Secondary | ICD-10-CM

## 2022-03-08 MED ORDER — TRIAMCINOLONE ACETONIDE 40 MG/ML IJ SUSP FOR KALEIDOSCOPE
40.0000 mg | INTRAMUSCULAR | Status: AC | PRN
  Administered 2022-03-08: 40 mg

## 2022-03-08 NOTE — Progress Notes (Signed)
[facility] Note  03/08/2022    CHIEF COMPLAINT Patient presents for Retina Follow Up  HISTORY OF PRESENT ILLNESS: Sylvia Anthony is a 66 y.o. female who presents to the clinic today for:   HPI     Retina Follow Up   Patient presents with  Other.  In both eyes.  Severity is moderate.  Duration of 3 weeks.  Since onset it is gradually improving.  I, the attending physician,  performed the HPI with the patient and updated documentation appropriately.        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 Pt states VA is a bit better, no issues. Confirms taking PF and prolensa QID OS.       Last edited by [name], MD on 03/08/2022 12:33 PM.     Referring physician: Glenda Chroman, MD [place],  [place] [address]  HISTORICAL INFORMATION:   Selected notes from the MEDICAL RECORD NUMBER Referred by [name] for concern of bilateral vitritis.   CURRENT MEDICATIONS: Current Outpatient Medications (Ophthalmic Drugs)  Medication Sig   brimonidine (ALPHAGAN) 0.2 % ophthalmic solution Place 1 drop into both eyes 3 (three) times daily.   Bromfenac Sodium (PROLENSA) 0.07 % SOLN Place 1 drop into the left eye in the morning, at noon, in the evening, and at bedtime.   COSOPT 22.3-6.8 MG/ML ophthalmic solution Place 1 drop into both eyes 2 (two) times daily.   prednisoLONE acetate (PRED FORTE) 1 % ophthalmic suspension Place 1 drop into the left eye 4 (four) times daily.   No current facility-administered medications for this visit. (Ophthalmic Drugs)   Current Outpatient Medications (Other)  Medication Sig   albuterol (PROVENTIL HFA;VENTOLIN HFA) 108 (90 BASE) MCG/ACT inhaler Inhale 2 puffs into the lungs every 6 (six) hours as needed for wheezing or shortness of breath.   amLODipine (NORVASC) 10 MG tablet Take 10 mg by mouth daily.   aspirin EC 81 MG tablet Take 1 tablet (81 mg total) by mouth daily.   carvedilol (COREG) 6.25 MG  tablet TAKE ONE TABLET BY MOUTH TWICE DAILY   cetirizine (ZYRTEC) 10 MG tablet Take 10 mg by mouth at bedtime as needed for allergies.    diclofenac (VOLTAREN) 75 MG EC tablet Take 75 mg by mouth daily as needed for mild pain.   ibuprofen (ADVIL) 200 MG tablet Take 400 mg by mouth every 8 (eight) hours as needed (pain.).   levothyroxine (SYNTHROID, LEVOTHROID) 112 MCG tablet Take 112 mcg by mouth daily before breakfast.    Niacin (VITAMIN B-3 PO) Take 1 tablet by mouth in the morning.   nitroGLYCERIN (NITROSTAT) 0.4 MG SL tablet Place 1 tablet (0.4 mg total) under the tongue every 5 (five) minutes as needed for chest pain.   pantoprazole (PROTONIX) 40 MG tablet Take 1 tablet (40 mg total) by mouth 2 (two) times daily before a meal.   potassium chloride SA (K-DUR,KLOR-CON) 20 MEQ tablet Take 20 mEq by mouth in the morning.   sucralfate (CARAFATE) 1 GM/10ML suspension Take 10 mLs (1 g total) by mouth 4 (four) times daily -  with meals and at bedtime.   valsartan-hydrochlorothiazide (DIOVAN-HCT) 320-25 MG per tablet Take 1 tablet by mouth in the morning.   No current facility-administered medications for this visit. (Other)   REVIEW OF SYSTEMS: ROS   Positive for: Gastrointestinal, Cardiovascular, Eyes Negative for: Constitutional, Neurological, Skin, Genitourinary, Musculoskeletal, HENT, Endocrine, Respiratory, Psychiatric,  Allergic/Imm, Heme/Lymph Last edited by Kingsley Spittle, COT on 03/08/2022  9:12 AM.     ALLERGIES Allergies  Allergen Reactions   Crestor [Rosuvastatin] Swelling and Other (See Comments)    Muscle aches/inflammation   Neurontin [Gabapentin] Swelling    Disoriented/"out of it"   PAST MEDICAL HISTORY Past Medical History:  Diagnosis Date   Anemia    Arm numbness    Cataract    Mixed form OU   Fibromyalgia    GERD (gastroesophageal reflux disease)    Glucose intolerance (impaired glucose tolerance)    H. pylori infection 01/2012   Amoxicillin, Biaxin    Hyperlipidemia    Hypertension    Hypertensive retinopathy    OU   Hypothyroidism    Mitral valve prolapse    not seen on recent echoes   Rheumatic fever    age 68, was at [facility] for 17 weeks   SSS (sick sinus syndrome) ([place])    1997 ppm, gen change 2020 (MDT)   Past Surgical History:  Procedure Laterality Date   ABDOMINAL HYSTERECTOMY     partial   APPENDECTOMY     BALLOON DILATION N/A 10/18/2021   Procedure: BALLOON DILATION;  Surgeon: Eloise Harman, DO;  [facility];  Service: Endoscopy;  Laterality: N/A;   BIOPSY  10/18/2021   Procedure: BIOPSY;  Surgeon: Eloise Harman, DO;  [facility];  Service: Endoscopy;;   CARDIAC CATHETERIZATION     multiple   CHOLECYSTECTOMY     COLONOSCOPY  08/01/2006   3 mm descending colon polyp removed/8 mm sessile ascending colon polyp removed / 3-mm rectal  polyp removed /Rare sigmoid diverticulosis/ Moderate internal hemorrhoids.Advanced adenoma on colonoscopy in March 2008.  The polyp was anadenomatous polyp with a foci of high-grade dysplasia   COLONOSCOPY  09/08/2008   SIMPLE ADENOMA/HYPERPLASTIC POLY/Multiple colon polyps (ascending, sigmoid, rectal)  Mild sigmoid colon diverticulosis./ Small internal hemorrhoids   COLONOSCOPY N/A 12/11/2017   Dr. Oneida Alar: diverticulosis, hemorrhoids next surveillance tcs 5 years.    COLONOSCOPY WITH ESOPHAGOGASTRODUODENOSCOPY (EGD)  Sept. 30, 2013   WLS:LHTD diverticulosis was noted in the sigmoid colon/The colon was otherwise normal/Small internal hemorrhoids/EGD:The mucosa of the esophagus appeared normal/Non-erosive gastritis (inflammation) was found; multiple bx/The duodenal mucosa showed no abnormalities. +H.pylori gastritis, treated with equivalent of prevpac. SAVARY DILATION   ESOPHAGEAL DILATION N/A 10/07/2014   Procedure: ESOPHAGEAL DILATION;  Surgeon: Danie Binder, MD;  [facility];  Service: Endoscopy;  Laterality: N/A;   ESOPHAGOGASTRODUODENOSCOPY  N/A 10/07/2014   Dr. Oneida Alar: moderate non-erosive gastritis, no definite stricture, empiric dilation . negative H.pylori    ESOPHAGOGASTRODUODENOSCOPY (EGD) WITH PROPOFOL N/A 10/18/2021   Procedure: ESOPHAGOGASTRODUODENOSCOPY (EGD) WITH PROPOFOL;  Surgeon: Eloise Harman, DO;  [facility];  Service: Endoscopy;  Laterality: N/A;  2:30pm   growth removed from intestine     [facility]-as teenager, done through colonoscopy   PACEMAKER GENERATOR CHANGE  09/18/2006   generator change by Dr Leonia Reeves with a MDT [place]  09/13/1995   for sick sinus syndome and syncope at [place] N/A 06/11/2018    Medtronic Azure XT DR MRI SureScan model [id number] (serial number [id number]) pacemaker by Dr Rayann Heman   right Achilles tendon     X 2   TONSILLECTOMY     FAMILY HISTORY Family History  Problem Relation Age of Onset   Hyperlipidemia Mother    Hypertension Mother  Heart disease Father    Hypertension Father    Hyperlipidemia Sister    Hypertension Sister    Colon polyps Sister    Hyperlipidemia Brother    Hypertension Brother    Colon polyps Brother    Colon cancer Neg Hx    SOCIAL HISTORY Social History   Tobacco Use   Smoking status: Former    Packs/day: 0.25    Years: 4.00    Total pack years: 1.00    Types: Cigarettes    Quit date: 10/09/1985    Years since quitting: 36.4   Smokeless tobacco: Never   Tobacco comments:    just in teens  Vaping Use   Vaping Use: Never used  Substance Use Topics   Alcohol use: No    Alcohol/week: 0.0 standard drinks of alcohol   Drug use: No       OPHTHALMIC EXAM:  Base Eye Exam     Visual Acuity (Snellen - Linear)       Right Left   Dist [place] 20/20 -2 20/40 +1   Dist ph [place]  NI         Tonometry (Tonopen, 9:16 AM)       Right Left   Pressure 10 11         Pupils       Dark Light Shape React APD   Right 3 2 Round Brisk None   Left 3 2 Round Brisk None          Visual Fields       Left Right    Full Full         Extraocular Movement       Right Left    Full, Ortho Full, Ortho         Neuro/Psych     Oriented x3: Yes   Mood/Affect: Normal         Dilation     Both eyes: 1.0% Mydriacyl, 2.5% Phenylephrine @ 9:17 AM           Slit Lamp and Fundus Exam     Slit Lamp Exam       Right Left   Lids/Lashes Mild Dermatochalasis - upper lid Mild Dermatochalasis - upper lid   Conjunctiva/Sclera Nasal Pinguecula, mild Melanosis Nasal Pinguecula, mild Melanosis   Cornea Mild Arcus, well healed cataract wound, trace PEE Mild Arcus, well healed cataract wound, 1-2+ Punctate epithelial erosions, tear film debris   Anterior Chamber deep, clear, narrow temporal angle, no cell/flare deep, narrow temporal angle, 0.5+fine cell/pigment   Iris Round and dilated Round and dilated   Lens PC IOL in good position PC IOL in good position   Anterior Vitreous Vitreous syneresis, +cell/pigment Vitreous syneresis, +cell/pigment         Fundus Exam       Right Left   Disc Pink and Sharp, central cupping and pallor Pink and Sharp   C/D Ratio 0.75 0.6   Macula Flat, good foveal reflex, mild RPE mottling, no heme or edema Flat, blunted foveal reflex, central CME -- improving, mild Retinal pigment epithelial mottling, no heme   Vessels attenuated, Tortuous mild attenuation, mild tortuosity, mild copper wiring, mild AV crossing changes   Periphery Attached, no heme Attached, no heme, no snowbanking, peripheral pigmented cystoid degeneration inferiory           IMAGING AND PROCEDURES  Imaging and Procedures for $RemoveBefore'@TODAY'[id number]$ @  OCT, Retina - OU - Both Eyes       Right  Eye Quality was good. Central Foveal Thickness: 258. Progression has been stable. Findings include normal foveal contour, no IRF, no SRF (No IRF/CME).   Left Eye Quality was good. Central Foveal Thickness: 389. Progression has improved. Findings include abnormal foveal contour,  intraretinal fluid, subretinal fluid (Interval improvement in IRF, persistent SRF).   Notes *Images captured and stored on drive  Diagnosis / Impression:  OD: No IRF/CME OS: Interval improvement in IRF, persistent SRF  Clinical management:  See below  Abbreviations: NFP - Normal foveal profile. CME - cystoid macular edema. PED - pigment epithelial detachment. IRF - intraretinal fluid. SRF - subretinal fluid. EZ - ellipsoid zone. ERM - epiretinal membrane. ORA - outer retinal atrophy. ORT - outer retinal tubulation. SRHM - subretinal hyper-reflective material      Injection into Tenon's Capsule - OS - Left Eye       Time Out 03/08/2022. 9:54 AM. Confirmed correct patient, procedure, site, and patient consented.   Anesthesia Topical anesthesia was used. Anesthetic medications included Lidocaine 2%, Proparacaine 0.5%.   Procedure Preparation included 5% betadine to ocular surface, eyelid speculum. A (25g) needle was used.   Injection: 40 mg triamcinolone acetonide 40 MG/ML   Route: Other, Site: Left Eye   NDC: [id number], Lot: [id number], Expiration date: 04/29/2023   Post-op Post injection exam found visual acuity of at least counting fingers. The patient tolerated the procedure well. There were no complications. The patient received written and verbal post procedure care education. Post injection medications included ocuflox.   Notes 1 cc of Kenalog-40 (40 mg) injected into subtenon's capsule in the superotemporal quadrant. Betadine was applied to injection area pre and post-injection then rinsed with sterile BSS. 1 drop of polymixin was instilled into the eye. There were no complications. Pt tolerated procedure well.            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 Cystoid macular edema of both eyes  H35.353 OCT, Retina - OU - Both Eyes    Injection into Tenon's Capsule - OS - Left Eye    triamcinolone acetonide (KENALOG-40)  injection 40 mg    3. Uveitis  H20.9     4. Essential hypertension  I10     5. Hypertensive retinopathy of both eyes  H35.033     6. Pseudophakia, both eyes  Z96.1     7. 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 Glaucoma suspect of both eyes  H40.003       1-3. Mild Panuveitis w/ CME OU  - recurrent CME OS likely related to semi-recent cataract sx (April/May 2023); OD without CME  - delayed to follow up from 10.11.21 to 11.29.22  - at initial presentation, pt reported 1+ mo history of floaters and decreased vision OU; +photophobia  - saw Dr. Earlene Plater, who noted [facility] cell and started PF q1h  - initial exam here showed +cell/pigment in [facility] and vitreous cavity; mild vitreous haze and +central CME OU  - FA (02.15.21) showed central petaloid hyperfluorescence and hyperfluorescence of the optic disc  - s/p STK OD (05.25.21)  - pt reports history of fibromyalgia, family history of lupus  - initiated uveitis lab work up -- all WNL except +toxo IgG   CBC, CMP   RPR, VDRL, FTA-Abs, MHA   HIV, Lyme, Quant-Gold   Toxoplasma titers   HLA Panel  ANA   ANCA   ACE, Lysozyme   RF   ESR, CRP   CXR  - chest x-ray without TB or pulmonary sarcoidosis, but did reveal a 1.6cm nodular mass in right lung base  - hx of Covid-19 infection ?involvement  - repeat FA (08.23.21) showed interval improvement in perifoveal petaloid leakage OU  - OCT shows interval improvement in IRF, persistent SRF OS on PF and Prolensa QID OS  - discussed findings  - recommend STK OS #1 today, 10.10.23  - pt wishes to proceed with injection  - RBA of procedure discussed, questions answered - informed consent obtained and signed - see procedure note  - continue PF and Prolensa QID OS  - f/u 3-4 weeks -- DFE/OCT  4,5. Hypertensive retinopathy OU  - discussed importance of tight BP control  - monitor  6. Pseudophakia OU  - s/p CE/IOL OU (Dr. Lucianne Lei, April and May 2023)  - IOLs in  good position, doing well  - monitor  7. 1.6 cm lung nodule of R lung base found on CXR  - following with Pulmonology -- lesion shrinking and no interventions have been recommended  - PET imaging done on 3.11.21 -- identifying some metabolic lesions (see report below)  - CT chest performed 4.14.21 -- indicating mild dec in some dimensions (see report below)  - pt saw [name] who had originally scheduled a bronchoscopy, but was cancelled following the CT evidence of decreasing size on CT  - nodule continues to decrease in size on imaging  8,9. 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 Lucianne Lei  Ophthalmic Meds Ordered this visit:  Meds ordered this encounter  Medications   triamcinolone acetonide (KENALOG-40) injection 40 mg   This document serves as a record of services personally performed by Gardiner Sleeper, MD, PhD. It was created on their behalf by Orvan Falconer, an ophthalmic technician. The creation of this record is the provider's dictation and/or activities during the visit.    Electronically signed by: Orvan Falconer, OA, 03/08/22  12:35 PM  This document serves as a record of services personally performed by Gardiner Sleeper, MD, PhD. It was created on their behalf by San Jetty. Owens Shark, OA an ophthalmic technician. The creation of this record is the provider's dictation and/or activities during the visit.    Electronically signed by: San Jetty. Owens Shark, [place] 10.10.2023 12:35 PM  Gardiner Sleeper, M.D., Ph.D. Diseases & Surgery of the Retina and Vitreous [facility]  I have reviewed the above documentation for accuracy and completeness, and I agree with the above. Gardiner Sleeper, M.D., Ph.D. 03/08/22 12:35 PM   Abbreviations: M myopia (nearsighted); A astigmatism; H hyperopia  (farsighted); P presbyopia; Mrx spectacle prescription;  CTL contact lenses; OD right eye; OS left eye; OU both eyes  XT exotropia; ET esotropia; PEK punctate epithelial keratitis; PEE punctate epithelial erosions; DES dry eye syndrome; MGD meibomian gland dysfunction; ATs artificial tears; PFAT's preservative free artificial tears; [place] nuclear sclerotic cataract; PSC posterior subcapsular cataract; ERM epi-retinal membrane; PVD posterior vitreous detachment; RD retinal detachment; DM diabetes mellitus; DR diabetic retinopathy; NPDR non-proliferative diabetic retinopathy; PDR proliferative diabetic retinopathy; CSME clinically significant macular edema; DME diabetic macular edema; dbh dot blot hemorrhages; CWS cotton wool spot; POAG primary  open angle glaucoma; C/D cup-to-disc ratio; HVF humphrey visual field; GVF goldmann visual field; OCT optical coherence tomography; IOP intraocular pressure; BRVO Branch retinal vein occlusion; CRVO central retinal vein occlusion; CRAO central retinal artery occlusion; BRAO branch retinal artery occlusion; RT retinal tear; SB scleral buckle; PPV pars plana vitrectomy; VH Vitreous hemorrhage; PRP panretinal laser photocoagulation; IVK intravitreal kenalog; VMT vitreomacular traction; MH Macular hole;  NVD neovascularization of the disc; NVE neovascularization elsewhere; AREDS age related eye disease study; ARMD age related macular degeneration; POAG primary open angle glaucoma; EBMD epithelial/anterior basement membrane dystrophy; ACIOL anterior chamber intraocular lens; IOL intraocular lens; PCIOL posterior chamber intraocular lens; Phaco/IOL phacoemulsification with intraocular lens placement; [place] photorefractive keratectomy; LASIK laser assisted in situ keratomileusis; HTN hypertension; DM diabetes mellitus; COPD chronic obstructive pulmonary disease

## 2022-03-09 ENCOUNTER — Ambulatory Visit (INDEPENDENT_AMBULATORY_CARE_PROVIDER_SITE_OTHER): Payer: Medicare Other

## 2022-03-09 DIAGNOSIS — I495 Sick sinus syndrome: Secondary | ICD-10-CM

## 2022-03-09 LAB — CUP PACEART REMOTE DEVICE CHECK
Battery Remaining Longevity: 127 mo
Battery Voltage: 3.02 V
Brady Statistic AP VP Percent: 0.04 %
Brady Statistic AP VS Percent: 83.33 %
Brady Statistic AS VP Percent: 0.01 %
Brady Statistic AS VS Percent: 16.62 %
Brady Statistic RA Percent Paced: 83.84 %
Brady Statistic RV Percent Paced: 0.05 %
Date Time Interrogation Session: 20231011044821
Implantable Lead Implant Date: 19970416
Implantable Lead Implant Date: 19970416
Implantable Lead Location: 753859
Implantable Lead Location: 753860
Implantable Lead Model: 5034
Implantable Lead Model: 5534
Implantable Pulse Generator Implant Date: 20200113
Lead Channel Impedance Value: 665 Ohm
Lead Channel Impedance Value: 684 Ohm
Lead Channel Impedance Value: 722 Ohm
Lead Channel Impedance Value: 741 Ohm
Lead Channel Pacing Threshold Amplitude: 0.5 V
Lead Channel Pacing Threshold Amplitude: 0.625 V
Lead Channel Pacing Threshold Pulse Width: 0.4 ms
Lead Channel Pacing Threshold Pulse Width: 0.4 ms
Lead Channel Sensing Intrinsic Amplitude: 18.875 mV
Lead Channel Sensing Intrinsic Amplitude: 18.875 mV
Lead Channel Sensing Intrinsic Amplitude: 2 mV
Lead Channel Sensing Intrinsic Amplitude: 2 mV
Lead Channel Setting Pacing Amplitude: 1.5 V
Lead Channel Setting Pacing Amplitude: 2.5 V
Lead Channel Setting Pacing Pulse Width: 0.4 ms
Lead Channel Setting Sensing Sensitivity: 2 mV

## 2022-03-15 NOTE — Progress Notes (Signed)
[facility] Note  03/29/2022    CHIEF COMPLAINT Patient presents for Retina Follow Up  HISTORY OF PRESENT ILLNESS: Sylvia Anthony is a 66 y.o. female who presents to the clinic today for:   HPI     Retina Follow Up   Patient presents with  Other.  In both eyes.  This started 3 weeks ago.  I, the attending physician,  performed the HPI with the patient and updated documentation appropriately.        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 Patient states vision doing better, a lot better. No eye pain.       Last edited by [name], MD on 03/29/2022 11:32 PM.    Pt states vision has improved since receiving STK at last visit  Referring physician: Glenda Chroman, MD [place],  [place] [address]  HISTORICAL INFORMATION:   Selected notes from the MEDICAL RECORD NUMBER Referred by [name] for concern of bilateral vitritis.   CURRENT MEDICATIONS: Current Outpatient Medications (Ophthalmic Drugs)  Medication Sig   brimonidine (ALPHAGAN) 0.2 % ophthalmic solution Place 1 drop into both eyes 3 (three) times daily.   Bromfenac Sodium (PROLENSA) 0.07 % SOLN Place 1 drop into the left eye in the morning, at noon, in the evening, and at bedtime.   COSOPT 22.3-6.8 MG/ML ophthalmic solution Place 1 drop into both eyes 2 (two) times daily.   prednisoLONE acetate (PRED FORTE) 1 % ophthalmic suspension Place 1 drop into the left eye 4 (four) times daily.   No current facility-administered medications for this visit. (Ophthalmic Drugs)   Current Outpatient Medications (Other)  Medication Sig   albuterol (PROVENTIL HFA;VENTOLIN HFA) 108 (90 BASE) MCG/ACT inhaler Inhale 2 puffs into the lungs every 6 (six) hours as needed for wheezing or shortness of breath.   amLODipine (NORVASC) 10 MG tablet Take 10 mg by mouth daily.   aspirin EC 81 MG tablet Take 1 tablet (81 mg total) by mouth daily.   carvedilol (COREG)  6.25 MG tablet TAKE ONE TABLET BY MOUTH TWICE DAILY   cetirizine (ZYRTEC) 10 MG tablet Take 10 mg by mouth at bedtime as needed for allergies.    diclofenac (VOLTAREN) 75 MG EC tablet Take 75 mg by mouth daily as needed for mild pain.   ibuprofen (ADVIL) 200 MG tablet Take 400 mg by mouth every 8 (eight) hours as needed (pain.).   levothyroxine (SYNTHROID, LEVOTHROID) 112 MCG tablet Take 112 mcg by mouth daily before breakfast.    Niacin (VITAMIN B-3 PO) Take 1 tablet by mouth in the morning.   nitroGLYCERIN (NITROSTAT) 0.4 MG SL tablet Place 1 tablet (0.4 mg total) under the tongue every 5 (five) minutes as needed for chest pain.   pantoprazole (PROTONIX) 40 MG tablet Take 1 tablet (40 mg total) by mouth 2 (two) times daily before a meal.   potassium chloride SA (K-DUR,KLOR-CON) 20 MEQ tablet Take 20 mEq by mouth in the morning.   sucralfate (CARAFATE) 1 GM/10ML suspension Take 10 mLs (1 g total) by mouth 4 (four) times daily -  with meals and at bedtime.   valsartan-hydrochlorothiazide (DIOVAN-HCT) 320-25 MG per tablet Take 1 tablet by mouth in the morning.   No current facility-administered medications for this visit. (Other)   REVIEW OF SYSTEMS: ROS   Positive for: Gastrointestinal, Cardiovascular, Eyes Negative for: Constitutional, Neurological, Skin, Genitourinary, Musculoskeletal, HENT, Endocrine, Respiratory, Psychiatric, Allergic/Imm, Heme/Lymph  Last edited by Theodore Demark, COA on 03/29/2022  8:45 AM.     ALLERGIES Allergies  Allergen Reactions   Crestor [Rosuvastatin] Swelling and Other (See Comments)    Muscle aches/inflammation   Neurontin [Gabapentin] Swelling    Disoriented/"out of it"   PAST MEDICAL HISTORY Past Medical History:  Diagnosis Date   Anemia    Arm numbness    Cataract    Mixed form OU   Fibromyalgia    GERD (gastroesophageal reflux disease)    Glucose intolerance (impaired glucose tolerance)    H. pylori infection 01/2012   Amoxicillin, Biaxin    Hyperlipidemia    Hypertension    Hypertensive retinopathy    OU   Hypothyroidism    Mitral valve prolapse    not seen on recent echoes   Rheumatic fever    age 46, was at [facility] for 17 weeks   SSS (sick sinus syndrome) ([place])    1997 ppm, gen change 2020 (MDT)   Past Surgical History:  Procedure Laterality Date   ABDOMINAL HYSTERECTOMY     partial   APPENDECTOMY     BALLOON DILATION N/A 10/18/2021   Procedure: BALLOON DILATION;  Surgeon: Eloise Harman, DO;  [facility];  Service: Endoscopy;  Laterality: N/A;   BIOPSY  10/18/2021   Procedure: BIOPSY;  Surgeon: Eloise Harman, DO;  [facility];  Service: Endoscopy;;   CARDIAC CATHETERIZATION     multiple   CHOLECYSTECTOMY     COLONOSCOPY  08/01/2006   3 mm descending colon polyp removed/8 mm sessile ascending colon polyp removed / 3-mm rectal  polyp removed /Rare sigmoid diverticulosis/ Moderate internal hemorrhoids.Advanced adenoma on colonoscopy in March 2008.  The polyp was anadenomatous polyp with a foci of high-grade dysplasia   COLONOSCOPY  09/08/2008   SIMPLE ADENOMA/HYPERPLASTIC POLY/Multiple colon polyps (ascending, sigmoid, rectal)  Mild sigmoid colon diverticulosis./ Small internal hemorrhoids   COLONOSCOPY N/A 12/11/2017   Dr. Oneida Alar: diverticulosis, hemorrhoids next surveillance tcs 5 years.    COLONOSCOPY WITH ESOPHAGOGASTRODUODENOSCOPY (EGD)  Sept. 30, 2013   QQI:WLNL diverticulosis was noted in the sigmoid colon/The colon was otherwise normal/Small internal hemorrhoids/EGD:The mucosa of the esophagus appeared normal/Non-erosive gastritis (inflammation) was found; multiple bx/The duodenal mucosa showed no abnormalities. +H.pylori gastritis, treated with equivalent of prevpac. SAVARY DILATION   ESOPHAGEAL DILATION N/A 10/07/2014   Procedure: ESOPHAGEAL DILATION;  Surgeon: Danie Binder, MD;  [facility];  Service: Endoscopy;  Laterality: N/A;   ESOPHAGOGASTRODUODENOSCOPY  N/A 10/07/2014   Dr. Oneida Alar: moderate non-erosive gastritis, no definite stricture, empiric dilation . negative H.pylori    ESOPHAGOGASTRODUODENOSCOPY (EGD) WITH PROPOFOL N/A 10/18/2021   Procedure: ESOPHAGOGASTRODUODENOSCOPY (EGD) WITH PROPOFOL;  Surgeon: Eloise Harman, DO;  [facility];  Service: Endoscopy;  Laterality: N/A;  2:30pm   growth removed from intestine     [facility]-as teenager, done through colonoscopy   PACEMAKER GENERATOR CHANGE  09/18/2006   generator change by Dr Leonia Reeves with a MDT [place]  09/13/1995   for sick sinus syndome and syncope at [place] N/A 06/11/2018    [employer]onic Azure XT DR MRI SureScan model [id number] (serial number [id number]) pacemaker by Dr Rayann Heman   right Achilles tendon     X 2   TONSILLECTOMY     FAMILY HISTORY Family History  Problem Relation Age of Onset   Hyperlipidemia Mother    Hypertension Mother  Heart disease Father    Hypertension Father    Hyperlipidemia Sister    Hypertension Sister    Colon polyps Sister    Hyperlipidemia Brother    Hypertension Brother    Colon polyps Brother    Colon cancer Neg Hx    SOCIAL HISTORY Social History   Tobacco Use   Smoking status: Former    Packs/day: 0.25    Years: 4.00    Total pack years: 1.00    Types: Cigarettes    Quit date: 10/09/1985    Years since quitting: 36.4   Smokeless tobacco: Never   Tobacco comments:    just in teens  Vaping Use   Vaping Use: Never used  Substance Use Topics   Alcohol use: No    Alcohol/week: 0.0 standard drinks of alcohol   Drug use: No       OPHTHALMIC EXAM:  Base Eye Exam     Visual Acuity (Snellen - Linear)       Right Left   Dist [place] 20/25 -2 20/25 -2   Dist ph [place] 20/20 -1 NI         Tonometry (Tonopen, 8:43 AM)       Right Left   Pressure 12 19         Pupils       Dark Light Shape React APD   Right 3 2 Round Brisk None   Left 3 2 Round Brisk None          Visual Fields (Counting fingers)       Left Right    Full Full         Extraocular Movement       Right Left    Full, Ortho Full, Ortho         Neuro/Psych     Oriented x3: Yes   Mood/Affect: Normal         Dilation     Both eyes: 1.0% Mydriacyl, 2.5% Phenylephrine @ 8:43 AM           Slit Lamp and Fundus Exam     Slit Lamp Exam       Right Left   Lids/Lashes Mild Dermatochalasis - upper lid Mild Dermatochalasis - upper lid   Conjunctiva/Sclera Nasal Pinguecula, mild Melanosis Nasal Pinguecula, mild Melanosis   Cornea Mild Arcus, well healed cataract wound, trace PEE Mild Arcus, well healed cataract wound, trace Punctate epithelial erosions, tear film debris   Anterior Chamber deep, clear, narrow temporal angle, no cell/flare deep, narrow temporal angle, 0.5+fine cell/pigment   Iris Round and dilated Round and dilated   Lens PC IOL in good position PC IOL in good position   Anterior Vitreous Vitreous syneresis, trace cell/pigment Vitreous syneresis, trace cell/pigment         Fundus Exam       Right Left   Disc Pink and Sharp, central cupping and pallor, mild PPP Pink and Sharp   C/D Ratio 0.75 0.6   Macula Flat, good foveal reflex, mild RPE mottling, no heme or edema Flat, blunted foveal reflex, central CME -- resolved, mild Retinal pigment epithelial mottling, no heme   Vessels attenuated, Tortuous mild attenuation, mild tortuosity, mild copper wiring, mild AV crossing changes   Periphery Attached, no heme Attached, no heme, no snowbanking, peripheral pigmented cystoid degeneration inferiory           IMAGING AND PROCEDURES  Imaging and Procedures for @TODAY @  OCT, Retina - OU - Both Eyes  Right Eye Quality was good. Central Foveal Thickness: 252. Progression has been stable. Findings include normal foveal contour, no IRF, no SRF (No IRF/CME).   Left Eye Quality was good. Central Foveal Thickness: 258. Progression has improved.  Findings include normal foveal contour, no IRF, no SRF (Interval resolution of IRF/CME and SRF).   Notes *Images captured and stored on drive  Diagnosis / Impression:  OD: No IRF/CME OS: Interval resolution of IRF/CME and SRF  Clinical management:  See below  Abbreviations: NFP - Normal foveal profile. CME - cystoid macular edema. PED - pigment epithelial detachment. IRF - intraretinal fluid. SRF - subretinal fluid. EZ - ellipsoid zone. ERM - epiretinal membrane. ORA - outer retinal atrophy. ORT - outer retinal tubulation. 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 Cystoid macular edema of both eyes  H35.353 OCT, Retina - OU - Both Eyes    3. Uveitis  H20.9     4. Essential hypertension  I10     5. Hypertensive retinopathy of both eyes  H35.033     6. Pseudophakia, both eyes  Z96.1     7. 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 Glaucoma suspect of both eyes  H40.003      1-3. Mild Panuveitis w/ CME OU  - s/p STK OS #1 (10.10.23)  - recurrent CME OS likely related to semi-recent cataract sx (April/May 2023); OD without CME  - delayed to follow up from 10.11.21 to 11.29.22  - at initial presentation, pt reported 1+ mo history of floaters and decreased vision OU; +photophobia  - saw [name], who noted [facility] cell and started PF q1h  - initial exam here showed +cell/pigment in [facility] and vitreous cavity; mild vitreous haze and +central CME OU  - FA (02.15.21) showed central petaloid hyperfluorescence and hyperfluorescence of the optic disc  - s/p STK OD (05.25.21)  - pt reports history of fibromyalgia, family history of lupus  - initiated uveitis lab work up -- all WNL except +toxo IgG   CBC, CMP   RPR, VDRL, FTA-Abs, MHA   HIV, Lyme, Quant-Gold   Toxoplasma titers   HLA Panel   ANA   ANCA   ACE, Lysozyme   RF   ESR, CRP   CXR  - chest  x-ray without TB or pulmonary sarcoidosis, but did reveal a 1.6cm nodular mass in right lung base  - hx of Covid-19 infection ?involvement  - repeat FA (08.23.21) showed interval improvement in perifoveal petaloid leakage OU  - OCT shows interval resolution of IRF/CME and SRF  - discussed findings  - dec PF and Prolensa to BID OS   - f/u 6-8 weeks -- DFE/OCT  4,5. Hypertensive retinopathy OU  - discussed importance of tight BP control  - monitor  6. Pseudophakia OU  - s/p CE/IOL OU (Dr. Lucianne Lei, April and May 2023)  - IOLs in good position, doing well  - monitor  7. 1.6 cm lung nodule of R lung base found on CXR  - following with Pulmonology -- lesion shrinking and no interventions have been recommended  - PET imaging done on 3.11.21 -- identifying some metabolic lesions (see report below)  - CT chest performed 4.14.21 -- indicating mild dec in some dimensions (see report below)  -  pt saw [name] who had originally scheduled a bronchoscopy, but was cancelled following the CT evidence of decreasing size on CT  - nodule continues to decrease in size on imaging  8,9. 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 Lucianne Lei  Ophthalmic Meds Ordered this visit:  No orders of the defined types were placed in this encounter.  This document serves as a record of services personally performed by Gardiner Sleeper, MD, PhD. It was created on their behalf by Renaldo Reel, [place] an ophthalmic technician. The creation of this record is the provider's dictation and/or activities during the visit.    Electronically signed by:  Renaldo Reel, COT  10.17.23 11:34 PM  This document serves as a record of services personally performed by Gardiner Sleeper, MD, PhD. It was created on their behalf by San Jetty. Owens Shark, OA an ophthalmic  technician. The creation of this record is the provider's dictation and/or activities during the visit.    Electronically signed by: San Jetty. Owens Shark, [place] 10.31.2023 11:34 PM  Gardiner Sleeper, M.D., Ph.D. Diseases & Surgery of the Retina and Vitreous [facility]  I have reviewed the above documentation for accuracy and completeness, and I agree with the above. Gardiner Sleeper, M.D., Ph.D. 03/29/22 11:37 PM  Abbreviations: M myopia (nearsighted); A astigmatism; H hyperopia (farsighted); P presbyopia; Mrx spectacle prescription;  CTL contact lenses; OD right eye; OS left eye; OU both eyes  XT exotropia; ET esotropia; PEK punctate epithelial keratitis; PEE punctate epithelial erosions; DES dry eye syndrome; MGD meibomian gland dysfunction; ATs artificial tears; PFAT's preservative free artificial tears; [place] nuclear sclerotic cataract; PSC posterior subcapsular cataract; ERM epi-retinal membrane; PVD posterior vitreous detachment; RD retinal detachment; DM diabetes mellitus; DR diabetic retinopathy; NPDR non-proliferative diabetic retinopathy; PDR proliferative diabetic retinopathy; CSME clinically significant macular edema; DME diabetic macular edema; dbh dot blot hemorrhages; CWS cotton wool spot; POAG primary open angle glaucoma; C/D cup-to-disc ratio; HVF humphrey visual field; GVF goldmann visual field; OCT optical coherence tomography; IOP intraocular pressure; BRVO Branch retinal vein occlusion; CRVO central retinal vein occlusion; CRAO central retinal artery occlusion; BRAO branch retinal artery occlusion; RT retinal tear; SB scleral buckle; PPV pars plana vitrectomy; VH Vitreous hemorrhage; PRP panretinal laser photocoagulation; IVK intravitreal kenalog; VMT vitreomacular traction; MH Macular hole;  NVD neovascularization of the disc; NVE neovascularization elsewhere; AREDS age related eye disease study; ARMD age related macular degeneration; POAG primary open angle glaucoma;  EBMD epithelial/anterior basement membrane dystrophy; ACIOL anterior chamber intraocular lens; IOL intraocular lens; PCIOL posterior chamber intraocular lens; Phaco/IOL phacoemulsification with intraocular lens placement; [place] photorefractive keratectomy; LASIK laser assisted in situ keratomileusis; HTN hypertension; DM diabetes mellitus; COPD chronic obstructive pulmonary disease

## 2022-03-22 NOTE — Progress Notes (Signed)
Remote pacemaker transmission.   

## 2022-03-28 DIAGNOSIS — I10 Essential (primary) hypertension: Secondary | ICD-10-CM | POA: Diagnosis not present

## 2022-03-29 ENCOUNTER — Ambulatory Visit (INDEPENDENT_AMBULATORY_CARE_PROVIDER_SITE_OTHER): Payer: Medicare Other | Admitting: Ophthalmology

## 2022-03-29 ENCOUNTER — Encounter (INDEPENDENT_AMBULATORY_CARE_PROVIDER_SITE_OTHER): Payer: Self-pay | Admitting: Ophthalmology

## 2022-03-29 DIAGNOSIS — H44113 Panuveitis, bilateral: Secondary | ICD-10-CM | POA: Diagnosis not present

## 2022-03-29 DIAGNOSIS — H35033 Hypertensive retinopathy, bilateral: Secondary | ICD-10-CM | POA: Diagnosis not present

## 2022-03-29 DIAGNOSIS — R911 Solitary pulmonary nodule: Secondary | ICD-10-CM

## 2022-03-29 DIAGNOSIS — H40003 Preglaucoma, unspecified, bilateral: Secondary | ICD-10-CM | POA: Diagnosis not present

## 2022-03-29 DIAGNOSIS — Z961 Presence of intraocular lens: Secondary | ICD-10-CM | POA: Diagnosis not present

## 2022-03-29 DIAGNOSIS — H209 Unspecified iridocyclitis: Secondary | ICD-10-CM

## 2022-03-29 DIAGNOSIS — I10 Essential (primary) hypertension: Secondary | ICD-10-CM | POA: Diagnosis not present

## 2022-03-29 DIAGNOSIS — H40053 Ocular hypertension, bilateral: Secondary | ICD-10-CM | POA: Diagnosis not present

## 2022-03-29 DIAGNOSIS — H35353 Cystoid macular degeneration, bilateral: Secondary | ICD-10-CM

## 2022-04-11 DIAGNOSIS — Z299 Encounter for prophylactic measures, unspecified: Secondary | ICD-10-CM | POA: Diagnosis not present

## 2022-04-11 DIAGNOSIS — R5383 Other fatigue: Secondary | ICD-10-CM | POA: Diagnosis not present

## 2022-04-11 DIAGNOSIS — I10 Essential (primary) hypertension: Secondary | ICD-10-CM | POA: Diagnosis not present

## 2022-04-25 DIAGNOSIS — M542 Cervicalgia: Secondary | ICD-10-CM | POA: Diagnosis not present

## 2022-04-25 DIAGNOSIS — Z299 Encounter for prophylactic measures, unspecified: Secondary | ICD-10-CM | POA: Diagnosis not present

## 2022-04-25 DIAGNOSIS — R053 Chronic cough: Secondary | ICD-10-CM | POA: Diagnosis not present

## 2022-04-27 DIAGNOSIS — I10 Essential (primary) hypertension: Secondary | ICD-10-CM | POA: Diagnosis not present

## 2022-05-03 NOTE — Progress Notes (Shared)
[facility] Note  05/17/2022    CHIEF COMPLAINT Patient presents for No chief complaint on file.  HISTORY OF PRESENT ILLNESS: Sylvia Anthony is a 66 y.o. female who presents to the clinic today for:    Pt states vision has improved since receiving STK at last visit  Referring physician: Glenda Chroman, MD [place],  [place] [address]  HISTORICAL INFORMATION:   Selected notes from the MEDICAL RECORD NUMBER Referred by [name] for concern of bilateral vitritis.   CURRENT MEDICATIONS: Current Outpatient Medications (Ophthalmic Drugs)  Medication Sig   brimonidine (ALPHAGAN) 0.2 % ophthalmic solution Place 1 drop into both eyes 3 (three) times daily.   Bromfenac Sodium (PROLENSA) 0.07 % SOLN Place 1 drop into the left eye in the morning, at noon, in the evening, and at bedtime.   COSOPT 22.3-6.8 MG/ML ophthalmic solution Place 1 drop into both eyes 2 (two) times daily.   prednisoLONE acetate (PRED FORTE) 1 % ophthalmic suspension Place 1 drop into the left eye 4 (four) times daily.   No current facility-administered medications for this visit. (Ophthalmic Drugs)   Current Outpatient Medications (Other)  Medication Sig   albuterol (PROVENTIL HFA;VENTOLIN HFA) 108 (90 BASE) MCG/ACT inhaler Inhale 2 puffs into the lungs every 6 (six) hours as needed for wheezing or shortness of breath.   amLODipine (NORVASC) 10 MG tablet Take 10 mg by mouth daily.   aspirin EC 81 MG tablet Take 1 tablet (81 mg total) by mouth daily.   carvedilol (COREG) 6.25 MG tablet TAKE ONE TABLET BY MOUTH TWICE DAILY   cetirizine (ZYRTEC) 10 MG tablet Take 10 mg by mouth at bedtime as needed for allergies.    diclofenac (VOLTAREN) 75 MG EC tablet Take 75 mg by mouth daily as needed for mild pain.   ibuprofen (ADVIL) 200 MG tablet Take 400 mg by mouth every 8 (eight) hours as needed (pain.).   levothyroxine (SYNTHROID, LEVOTHROID) 112 MCG tablet Take 112 mcg by mouth daily  before breakfast.    Niacin (VITAMIN B-3 PO) Take 1 tablet by mouth in the morning.   nitroGLYCERIN (NITROSTAT) 0.4 MG SL tablet Place 1 tablet (0.4 mg total) under the tongue every 5 (five) minutes as needed for chest pain.   pantoprazole (PROTONIX) 40 MG tablet Take 1 tablet (40 mg total) by mouth 2 (two) times daily before a meal.   potassium chloride SA (K-DUR,KLOR-CON) 20 MEQ tablet Take 20 mEq by mouth in the morning.   sucralfate (CARAFATE) 1 GM/10ML suspension Take 10 mLs (1 g total) by mouth 4 (four) times daily -  with meals and at bedtime.   valsartan-hydrochlorothiazide (DIOVAN-HCT) 320-25 MG per tablet Take 1 tablet by mouth in the morning.   No current facility-administered medications for this visit. (Other)   REVIEW OF SYSTEMS:   ALLERGIES Allergies  Allergen Reactions   Crestor [Rosuvastatin] Swelling and Other (See Comments)    Muscle aches/inflammation   Neurontin [Gabapentin] Swelling    Disoriented/"out of it"   PAST MEDICAL HISTORY Past Medical History:  Diagnosis Date   Anemia    Arm numbness    Cataract    Mixed form OU   Fibromyalgia    GERD (gastroesophageal reflux disease)    Glucose intolerance (impaired glucose tolerance)    H. pylori infection 01/2012   Amoxicillin, Biaxin   Hyperlipidemia    Hypertension    Hypertensive retinopathy    OU   Hypothyroidism  Mitral valve prolapse    not seen on recent echoes   Rheumatic fever    age 33, was at [facility] for 17 weeks   SSS (sick sinus syndrome) ([place])    1997 ppm, gen change 2020 (MDT)   Past Surgical History:  Procedure Laterality Date   ABDOMINAL HYSTERECTOMY     partial   APPENDECTOMY     BALLOON DILATION N/A 10/18/2021   Procedure: BALLOON DILATION;  Surgeon: Eloise Harman, DO;  [facility];  Service: Endoscopy;  Laterality: N/A;   BIOPSY  10/18/2021   Procedure: BIOPSY;  Surgeon: Eloise Harman, DO;  [facility];  Service: Endoscopy;;   CARDIAC  CATHETERIZATION     multiple   CHOLECYSTECTOMY     COLONOSCOPY  08/01/2006   3 mm descending colon polyp removed/8 mm sessile ascending colon polyp removed / 3-mm rectal  polyp removed /Rare sigmoid diverticulosis/ Moderate internal hemorrhoids.Advanced adenoma on colonoscopy in March 2008.  The polyp was anadenomatous polyp with a foci of high-grade dysplasia   COLONOSCOPY  09/08/2008   SIMPLE ADENOMA/HYPERPLASTIC POLY/Multiple colon polyps (ascending, sigmoid, rectal)  Mild sigmoid colon diverticulosis./ Small internal hemorrhoids   COLONOSCOPY N/A 12/11/2017   Dr. Oneida Alar: diverticulosis, hemorrhoids next surveillance tcs 5 years.    COLONOSCOPY WITH ESOPHAGOGASTRODUODENOSCOPY (EGD)  Sept. 30, 2013   HUT:MLYY diverticulosis was noted in the sigmoid colon/The colon was otherwise normal/Small internal hemorrhoids/EGD:The mucosa of the esophagus appeared normal/Non-erosive gastritis (inflammation) was found; multiple bx/The duodenal mucosa showed no abnormalities. +H.pylori gastritis, treated with equivalent of prevpac. SAVARY DILATION   ESOPHAGEAL DILATION N/A 10/07/2014   Procedure: ESOPHAGEAL DILATION;  Surgeon: Danie Binder, MD;  [facility];  Service: Endoscopy;  Laterality: N/A;   ESOPHAGOGASTRODUODENOSCOPY N/A 10/07/2014   Dr. Oneida Alar: moderate non-erosive gastritis, no definite stricture, empiric dilation . negative H.pylori    ESOPHAGOGASTRODUODENOSCOPY (EGD) WITH PROPOFOL N/A 10/18/2021   Procedure: ESOPHAGOGASTRODUODENOSCOPY (EGD) WITH PROPOFOL;  Surgeon: Eloise Harman, DO;  [facility];  Service: Endoscopy;  Laterality: N/A;  2:30pm   growth removed from intestine     [facility]-as teenager, done through colonoscopy   [place]  09/18/2006   generator change by Dr Leonia Reeves with a MDT [place]  09/13/1995   for sick sinus syndome and syncope at [place] N/A 06/11/2018    Medtronic Azure XT  DR MRI SureScan model [id number] (serial number [id number]) pacemaker by Dr Rayann Heman   right Achilles tendon     X 2   TONSILLECTOMY     FAMILY HISTORY Family History  Problem Relation Age of Onset   Hyperlipidemia Mother    Hypertension Mother    Heart disease Father    Hypertension Father    Hyperlipidemia Sister    Hypertension Sister    Colon polyps Sister    Hyperlipidemia Brother    Hypertension Brother    Colon polyps Brother    Colon cancer Neg Hx    SOCIAL HISTORY Social History   Tobacco Use   Smoking status: Former    Packs/day: 0.25    Years: 4.00    Total pack years: 1.00    Types: Cigarettes    Quit date: 10/09/1985    Years since quitting: 36.5   Smokeless tobacco: Never   Tobacco comments:    just in teens  Vaping Use   Vaping Use: Never used  Substance Use  Topics   Alcohol use: No    Alcohol/week: 0.0 standard drinks of alcohol   Drug use: No       OPHTHALMIC EXAM:  Not recorded    IMAGING AND PROCEDURES  Imaging and Procedures for _0 @          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 Cystoid macular edema of both eyes  H35.353     3. Uveitis  H20.9     4. Essential hypertension  I10     5. Hypertensive retinopathy of both eyes  H35.033     6. Pseudophakia, both eyes  Z96.1     7. 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 Glaucoma suspect of both eyes  H40.003       1-3. Mild Panuveitis w/ CME OU  - s/p STK OS #1 (10.10.23)  - recurrent CME OS likely related to semi-recent cataract sx (April/May 2023); OD without CME  - delayed to follow up from 10.11.21 to 11.29.22  - at initial presentation, pt reported 1+ mo history of floaters and decreased vision OU; +photophobia  - saw [name], who noted [facility] cell and started PF q1h  - initial exam here showed +cell/pigment in [facility] and vitreous cavity; mild vitreous haze and +central CME OU  - FA (02.15.21)  showed central petaloid hyperfluorescence and hyperfluorescence of the optic disc  - s/p STK OD (05.25.21)  - pt reports history of fibromyalgia, family history of lupus  - initiated uveitis lab work up -- all WNL except +toxo IgG   CBC, CMP   RPR, VDRL, FTA-Abs, MHA   HIV, Lyme, Quant-Gold   Toxoplasma titers   HLA Panel   ANA   ANCA   ACE, Lysozyme   RF   ESR, CRP   CXR  - chest x-ray without TB or pulmonary sarcoidosis, but did reveal a 1.6cm nodular mass in right lung base  - hx of Covid-19 infection ?involvement  - repeat FA (08.23.21) showed interval improvement in perifoveal petaloid leakage OU  - OCT shows interval resolution of IRF/CME and SRF  - discussed findings  - dec PF and Prolensa to BID OS   - f/u 6-8 weeks -- DFE/OCT  4,5. Hypertensive retinopathy OU  - discussed importance of tight BP control  - monitor  6. Pseudophakia OU  - s/p CE/IOL OU (Dr. Lucianne Lei, April and May 2023)  - IOLs in good position, doing well  - monitor  7. 1.6 cm lung nodule of R lung base found on CXR  - following with Pulmonology -- lesion shrinking and no interventions have been recommended  - PET imaging done on 3.11.21 -- identifying some metabolic lesions (see report below)  - CT chest performed 4.14.21 -- indicating mild dec in some dimensions (see report below)  - pt saw [name] who had originally scheduled a bronchoscopy, but was cancelled following the CT evidence of decreasing size on CT  - nodule continues to decrease in size on imaging  8,9. 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 Lucianne Lei  Ophthalmic Meds Ordered this visit:  No orders of the defined types were placed in  this encounter.  This document serves as a record of services personally performed by Gardiner Sleeper, MD, PhD. It was created on their  behalf by Renaldo Reel, [place] an ophthalmic technician. The creation of this record is the provider's dictation and/or activities during the visit.    Electronically signed by:  Renaldo Reel, COT  12.05.23 8:17 AM   Gardiner Sleeper, M.D., Ph.D. Diseases & Surgery of the Retina and Vitreous [facility] [place]: M myopia (nearsighted); A astigmatism; H hyperopia (farsighted); P presbyopia; Mrx spectacle prescription;  CTL contact lenses; OD right eye; OS left eye; OU both eyes  XT exotropia; ET esotropia; PEK punctate epithelial keratitis; PEE punctate epithelial erosions; DES dry eye syndrome; MGD meibomian gland dysfunction; ATs artificial tears; PFAT's preservative free artificial tears; [place] nuclear sclerotic cataract; PSC posterior subcapsular cataract; ERM epi-retinal membrane; PVD posterior vitreous detachment; RD retinal detachment; DM diabetes mellitus; DR diabetic retinopathy; NPDR non-proliferative diabetic retinopathy; PDR proliferative diabetic retinopathy; CSME clinically significant macular edema; DME diabetic macular edema; dbh dot blot hemorrhages; CWS cotton wool spot; POAG primary open angle glaucoma; C/D cup-to-disc ratio; HVF humphrey visual field; GVF goldmann visual field; OCT optical coherence tomography; IOP intraocular pressure; BRVO Branch retinal vein occlusion; CRVO central retinal vein occlusion; CRAO central retinal artery occlusion; BRAO branch retinal artery occlusion; RT retinal tear; SB scleral buckle; PPV pars plana vitrectomy; VH Vitreous hemorrhage; PRP panretinal laser photocoagulation; IVK intravitreal kenalog; VMT vitreomacular traction; MH Macular hole;  NVD neovascularization of the disc; NVE neovascularization elsewhere; AREDS age related eye disease study; ARMD age related macular degeneration; POAG primary open angle glaucoma; EBMD epithelial/anterior basement membrane dystrophy; ACIOL anterior chamber intraocular lens;  IOL intraocular lens; PCIOL posterior chamber intraocular lens; Phaco/IOL phacoemulsification with intraocular lens placement; [place] photorefractive keratectomy; LASIK laser assisted in situ keratomileusis; HTN hypertension; DM diabetes mellitus; COPD chronic obstructive pulmonary disease

## 2022-05-17 ENCOUNTER — Encounter (INDEPENDENT_AMBULATORY_CARE_PROVIDER_SITE_OTHER): Payer: Medicare Other | Admitting: Ophthalmology

## 2022-05-17 DIAGNOSIS — H35033 Hypertensive retinopathy, bilateral: Secondary | ICD-10-CM

## 2022-05-17 DIAGNOSIS — I10 Essential (primary) hypertension: Secondary | ICD-10-CM

## 2022-05-17 DIAGNOSIS — H44113 Panuveitis, bilateral: Secondary | ICD-10-CM

## 2022-05-17 DIAGNOSIS — H35353 Cystoid macular degeneration, bilateral: Secondary | ICD-10-CM

## 2022-05-17 DIAGNOSIS — H40003 Preglaucoma, unspecified, bilateral: Secondary | ICD-10-CM

## 2022-05-17 DIAGNOSIS — R911 Solitary pulmonary nodule: Secondary | ICD-10-CM

## 2022-05-17 DIAGNOSIS — Z961 Presence of intraocular lens: Secondary | ICD-10-CM

## 2022-05-17 DIAGNOSIS — H40053 Ocular hypertension, bilateral: Secondary | ICD-10-CM

## 2022-05-17 DIAGNOSIS — H209 Unspecified iridocyclitis: Secondary | ICD-10-CM

## 2022-05-18 NOTE — Progress Notes (Signed)
[facility] Note  05/31/2022    CHIEF COMPLAINT Patient presents for Retina Follow Up  HISTORY OF PRESENT ILLNESS: Sylvia Anthony is a 66 y.o. female who presents to the clinic today for:   HPI     Retina Follow Up   Patient presents with  Other.  This started 6 weeks ago.  Duration of 6 weeks.  I, the attending physician,  performed the HPI with the patient and updated documentation appropriately.        Comments   6 week retina follow up panuveitis os pt is reporting no vision changes noticed she feels lilke her left eye is doing better she denies any flashes or floaters       Last edited by [name], MD on 06/04/2022 12:43 AM.     Patient states that the vision is better.  Referring physician: Glenda Chroman, MD [place],  [place] [address]  HISTORICAL INFORMATION:   Selected notes from the MEDICAL RECORD NUMBER Referred by [name] for concern of bilateral vitritis.   CURRENT MEDICATIONS: Current Outpatient Medications (Ophthalmic Drugs)  Medication Sig   brimonidine (ALPHAGAN) 0.2 % ophthalmic solution Place 1 drop into both eyes 3 (three) times daily.   Bromfenac Sodium (PROLENSA) 0.07 % SOLN Place 1 drop into the left eye in the morning, at noon, in the evening, and at bedtime.   COSOPT 22.3-6.8 MG/ML ophthalmic solution Place 1 drop into both eyes 2 (two) times daily.   prednisoLONE acetate (PRED FORTE) 1 % ophthalmic suspension Place 1 drop into the left eye 4 (four) times daily.   No current facility-administered medications for this visit. (Ophthalmic Drugs)   Current Outpatient Medications (Other)  Medication Sig   albuterol (PROVENTIL HFA;VENTOLIN HFA) 108 (90 BASE) MCG/ACT inhaler Inhale 2 puffs into the lungs every 6 (six) hours as needed for wheezing or shortness of breath.   amLODipine (NORVASC) 10 MG tablet Take 10 mg by mouth daily.   aspirin EC 81 MG tablet Take 1 tablet (81 mg total) by mouth daily.    carvedilol (COREG) 6.25 MG tablet TAKE ONE TABLET BY MOUTH TWICE DAILY   cetirizine (ZYRTEC) 10 MG tablet Take 10 mg by mouth at bedtime as needed for allergies.    diclofenac (VOLTAREN) 75 MG EC tablet Take 75 mg by mouth daily as needed for mild pain.   ibuprofen (ADVIL) 200 MG tablet Take 400 mg by mouth every 8 (eight) hours as needed (pain.).   levothyroxine (SYNTHROID, LEVOTHROID) 112 MCG tablet Take 112 mcg by mouth daily before breakfast.    Niacin (VITAMIN B-3 PO) Take 1 tablet by mouth in the morning.   nitroGLYCERIN (NITROSTAT) 0.4 MG SL tablet Place 1 tablet (0.4 mg total) under the tongue every 5 (five) minutes as needed for chest pain.   pantoprazole (PROTONIX) 40 MG tablet Take 1 tablet (40 mg total) by mouth 2 (two) times daily before a meal.   potassium chloride SA (K-DUR,KLOR-CON) 20 MEQ tablet Take 20 mEq by mouth in the morning.   sucralfate (CARAFATE) 1 GM/10ML suspension Take 10 mLs (1 g total) by mouth 4 (four) times daily -  with meals and at bedtime.   valsartan-hydrochlorothiazide (DIOVAN-HCT) 320-25 MG per tablet Take 1 tablet by mouth in the morning.   No current facility-administered medications for this visit. (Other)   REVIEW OF SYSTEMS: ROS   Positive for: Eyes Last edited by [name], MD on 06/04/2022 12:43 AM.  ALLERGIES Allergies  Allergen Reactions   Crestor [Rosuvastatin] Swelling and Other (See Comments)    Muscle aches/inflammation   Neurontin [Gabapentin] Swelling    Disoriented/"out of it"   PAST MEDICAL HISTORY Past Medical History:  Diagnosis Date   Anemia    Arm numbness    Cataract    Mixed form OU   Fibromyalgia    GERD (gastroesophageal reflux disease)    Glucose intolerance (impaired glucose tolerance)    H. pylori infection 01/2012   Amoxicillin, Biaxin   Hyperlipidemia    Hypertension    Hypertensive retinopathy    OU   Hypothyroidism    Mitral valve prolapse    not seen on recent echoes   Rheumatic fever    age  55, was at [facility] for 17 weeks   SSS (sick sinus syndrome) ([place])    1997 ppm, gen change 2020 (MDT)   Past Surgical History:  Procedure Laterality Date   ABDOMINAL HYSTERECTOMY     partial   APPENDECTOMY     BALLOON DILATION N/A 10/18/2021   Procedure: BALLOON DILATION;  Surgeon: Eloise Harman, DO;  [facility];  Service: Endoscopy;  Laterality: N/A;   BIOPSY  10/18/2021   Procedure: BIOPSY;  Surgeon: Eloise Harman, DO;  [facility];  Service: Endoscopy;;   CARDIAC CATHETERIZATION     multiple   CHOLECYSTECTOMY     COLONOSCOPY  08/01/2006   3 mm descending colon polyp removed/8 mm sessile ascending colon polyp removed / 3-mm rectal  polyp removed /Rare sigmoid diverticulosis/ Moderate internal hemorrhoids.Advanced adenoma on colonoscopy in March 2008.  The polyp was anadenomatous polyp with a foci of high-grade dysplasia   COLONOSCOPY  09/08/2008   SIMPLE ADENOMA/HYPERPLASTIC POLY/Multiple colon polyps (ascending, sigmoid, rectal)  Mild sigmoid colon diverticulosis./ Small internal hemorrhoids   COLONOSCOPY N/A 12/11/2017   Dr. Oneida Alar: diverticulosis, hemorrhoids next surveillance tcs 5 years.    COLONOSCOPY WITH ESOPHAGOGASTRODUODENOSCOPY (EGD)  Sept. 30, 2013   UTM:LYYT diverticulosis was noted in the sigmoid colon/The colon was otherwise normal/Small internal hemorrhoids/EGD:The mucosa of the esophagus appeared normal/Non-erosive gastritis (inflammation) was found; multiple bx/The duodenal mucosa showed no abnormalities. +H.pylori gastritis, treated with equivalent of prevpac. SAVARY DILATION   ESOPHAGEAL DILATION N/A 10/07/2014   Procedure: ESOPHAGEAL DILATION;  Surgeon: Danie Binder, MD;  [facility];  Service: Endoscopy;  Laterality: N/A;   ESOPHAGOGASTRODUODENOSCOPY N/A 10/07/2014   Dr. Oneida Alar: moderate non-erosive gastritis, no definite stricture, empiric dilation . negative H.pylori    ESOPHAGOGASTRODUODENOSCOPY (EGD) WITH PROPOFOL  N/A 10/18/2021   Procedure: ESOPHAGOGASTRODUODENOSCOPY (EGD) WITH PROPOFOL;  Surgeon: Eloise Harman, DO;  [facility];  Service: Endoscopy;  Laterality: N/A;  2:30pm   growth removed from intestine     [facility]-as teenager, done through colonoscopy   PACEMAKER GENERATOR CHANGE  09/18/2006   generator change by Dr Leonia Reeves with a MDT [place]  09/13/1995   for sick sinus syndome and syncope at [place] N/A 06/11/2018    Medtronic Azure XT DR MRI SureScan model [id number] (serial number [id number]) pacemaker by Dr Rayann Heman   right Achilles tendon     X 2   TONSILLECTOMY     FAMILY HISTORY Family History  Problem Relation Age of Onset   Hyperlipidemia Mother    Hypertension Mother    Heart disease Father    Hypertension Father    Hyperlipidemia Sister  Hypertension Sister    Colon polyps Sister    Hyperlipidemia Brother    Hypertension Brother    Colon polyps Brother    Colon cancer Neg Hx    SOCIAL HISTORY Social History   Tobacco Use   Smoking status: Former    Packs/day: 0.25    Years: 4.00    Total pack years: 1.00    Types: Cigarettes    Quit date: 10/09/1985    Years since quitting: 36.6   Smokeless tobacco: Never   Tobacco comments:    just in teens  Vaping Use   Vaping Use: Never used  Substance Use Topics   Alcohol use: No    Alcohol/week: 0.0 standard drinks of alcohol   Drug use: No       OPHTHALMIC EXAM:  Base Eye Exam     Visual Acuity (Snellen - Linear)       Right Left   Dist [place] 20/25 20/30 -1         Tonometry (Tonopen, 9:12 AM)       Right Left   Pressure 22 27         Pupils       Pupils Dark Light Shape React APD   Right PERRL 3 2 Round Brisk None   Left PERRL 3 2 Round Brisk None         Visual Fields       Left Right    Full Full         Extraocular Movement       Right Left    Full, Ortho Full, Ortho         Neuro/Psych     Oriented x3:  Yes   Mood/Affect: Normal         Dilation     Both eyes: 2.5% Phenylephrine @ 9:12 AM           Slit Lamp and Fundus Exam     Slit Lamp Exam       Right Left   Lids/Lashes Mild Dermatochalasis - upper lid Mild Dermatochalasis - upper lid   Conjunctiva/Sclera Nasal Pinguecula, mild Melanosis, [place] nansally Nasal Pinguecula, mild Melanosis; STK ST quad   Cornea Mild Arcus, well healed cataract wound, trace PEE Mild Arcus, well healed cataract wound, trace Punctate epithelial erosions, tear film debris   Anterior Chamber deep, clear, narrow temporal angle, no cell/flare deep, narrow temporal angle, 0.5+fine cell/pigment   Iris Round and dilated Round and dilated   Lens PC IOL in good position PC IOL in good position   Anterior Vitreous Vitreous syneresis, trace cell/pigment Vitreous syneresis, trace cell/pigment         Fundus Exam       Right Left   Disc Pink and Sharp, central cupping and pallor, mild PPP Pink and Sharp   C/D Ratio 0.75 0.6   Macula Flat, good foveal reflex, mild RPE mottling, no heme or edema Flat, good foveal reflex, central CME -- stably resolved, mild Retinal pigment epithelial mottling, no heme   Vessels attenuated, Tortuous mild attenuation, mild tortuosity, mild copper wiring, mild AV crossing changes   Periphery Attached, no heme Attached, no heme, no snowbanking, peripheral pigmented cystoid degeneration inferiory           IMAGING AND PROCEDURES  Imaging and Procedures for _0 @  OCT, Retina - OU - Both Eyes       Right Eye Quality was good. Central Foveal Thickness: 252. Progression has been stable. Findings  include normal foveal contour, no IRF, no SRF (No IRF/CME).   Left Eye Quality was good. Central Foveal Thickness: 249. Progression has improved. Findings include normal foveal contour, no IRF, no SRF (Stable resolution of IRF/CME and SRF).   Notes *Images captured and stored on drive  Diagnosis / Impression:  OD: No  IRF/CME OS: Stable resolution of IRF/CME and SRF  Clinical management:  See below  Abbreviations: NFP - Normal foveal profile. CME - cystoid macular edema. PED - pigment epithelial detachment. IRF - intraretinal fluid. SRF - subretinal fluid. EZ - ellipsoid zone. ERM - epiretinal membrane. ORA - outer retinal atrophy. ORT - outer retinal tubulation. SRHM - subretinal hyper-reflective material            ASSESSMENT/PLAN:    ICD-10-CM   1. 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 Cystoid macular edema of both eyes  H35.353 OCT, Retina - OU - Both Eyes    3. Uveitis  H20.9     4. Essential hypertension  I10     5. Hypertensive retinopathy of both eyes  H35.033     6. Pseudophakia, both eyes  Z96.1     7. 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 Glaucoma suspect of both eyes  H40.003      1-3. Mild Panuveitis w/ CME OU  - s/p STK OS #1 (10.10.23) - recurrent CME OS likely related to semi-recent cataract sx (April/May 2023); OD without CME  - delayed to follow up from 10.11.21 to 11.29.22 - at initial presentation, pt reported 1+ mo history of floaters and decreased vision OU; +photophobia  - saw [name], who noted [facility] cell and started PF q1h - initial exam here showed +cell/pigment in [facility] and vitreous cavity; mild vitreous haze and +central CME OU - FA (02.15.21) showed central petaloid hyperfluorescence and hyperfluorescence of the optic disc  - s/p STK OD (05.25.21)  - pt reports history of fibromyalgia, family history of lupus  - initiated uveitis lab work up -- all WNL except +toxo IgG   CBC, CMP   RPR, VDRL, FTA-Abs, MHA   HIV, Lyme, Quant-Gold   Toxoplasma titers   HLA Panel   ANA   ANCA   ACE, Lysozyme   RF   ESR, CRP   CXR - chest x-ray without TB or pulmonary sarcoidosis, but did reveal a 1.6cm nodular mass in right lung base  - hx of Covid-19 infection ?involvement - repeat FA  (08.23.21) showed interval improvement in perifoveal petaloid leakage OU  - OCT shows interval resolution of IRF/CME and SRF  - discussed findings  - dec PF and Prolensa to QD OS   - f/u 6 weeks -- DFE/OCT  4,5. Hypertensive retinopathy OU  - discussed importance of tight BP control  - continue to monitor  6. Pseudophakia OU  - s/p CE/IOL OU (Dr. Lucianne Lei, April and May 2023)  - IOLs in good position, doing well  - continue to monitor  7. 1.6 cm lung nodule of R lung base found on CXR - following with Pulmonology -- lesion shrinking and no interventions have been recommended - PET imaging done on 3.11.21 -- identifying some metabolic lesions (see report below) - CT chest performed 4.14.21 -- indicating mild dec in some dimensions (see report below) - pt saw [name] who had originally scheduled a bronchoscopy, but was  cancelled following the CT evidence of decreasing size on CT  - nodule continues to decrease in size on imaging  8,9. 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 Lucianne Lei  Ophthalmic Meds Ordered this visit:  No orders of the defined types were placed in this encounter.  This document serves as a record of services personally performed by Gardiner Sleeper, MD, PhD. It was created on their behalf by Renaldo Reel, [place] an ophthalmic technician. The creation of this record is the provider's dictation and/or activities during the visit.    Electronically signed by:  Renaldo Reel, COT  1.02.24 12:44 AM  Gardiner Sleeper, M.D., Ph.D. Diseases & Surgery of the Retina and Vitreous [facility]  I have reviewed the above documentation for accuracy and completeness, and I agree with the above. Gardiner Sleeper, M.D., Ph.D. 06/04/22 12:45 AM  Abbreviations: M myopia (nearsighted); A  astigmatism; H hyperopia (farsighted); P presbyopia; Mrx spectacle prescription;  CTL contact lenses; OD right eye; OS left eye; OU both eyes  XT exotropia; ET esotropia; PEK punctate epithelial keratitis; PEE punctate epithelial erosions; DES dry eye syndrome; MGD meibomian gland dysfunction; ATs artificial tears; PFAT's preservative free artificial tears; [place] nuclear sclerotic cataract; PSC posterior subcapsular cataract; ERM epi-retinal membrane; PVD posterior vitreous detachment; RD retinal detachment; DM diabetes mellitus; DR diabetic retinopathy; NPDR non-proliferative diabetic retinopathy; PDR proliferative diabetic retinopathy; CSME clinically significant macular edema; DME diabetic macular edema; dbh dot blot hemorrhages; CWS cotton wool spot; POAG primary open angle glaucoma; C/D cup-to-disc ratio; HVF humphrey visual field; GVF goldmann visual field; OCT optical coherence tomography; IOP intraocular pressure; BRVO Branch retinal vein occlusion; CRVO central retinal vein occlusion; CRAO central retinal artery occlusion; BRAO branch retinal artery occlusion; RT retinal tear; SB scleral buckle; PPV pars plana vitrectomy; VH Vitreous hemorrhage; PRP panretinal laser photocoagulation; IVK intravitreal kenalog; VMT vitreomacular traction; MH Macular hole;  NVD neovascularization of the disc; NVE neovascularization elsewhere; AREDS age related eye disease study; ARMD age related macular degeneration; POAG primary open angle glaucoma; EBMD epithelial/anterior basement membrane dystrophy; ACIOL anterior chamber intraocular lens; IOL intraocular lens; PCIOL posterior chamber intraocular lens; Phaco/IOL phacoemulsification with intraocular lens placement; [place] photorefractive keratectomy; LASIK laser assisted in situ keratomileusis; HTN hypertension; DM diabetes mellitus; COPD chronic obstructive pulmonary disease

## 2022-05-27 DIAGNOSIS — I10 Essential (primary) hypertension: Secondary | ICD-10-CM | POA: Diagnosis not present

## 2022-05-31 ENCOUNTER — Ambulatory Visit (INDEPENDENT_AMBULATORY_CARE_PROVIDER_SITE_OTHER): Payer: Medicare Other | Admitting: Ophthalmology

## 2022-05-31 DIAGNOSIS — Z961 Presence of intraocular lens: Secondary | ICD-10-CM | POA: Diagnosis not present

## 2022-05-31 DIAGNOSIS — H209 Unspecified iridocyclitis: Secondary | ICD-10-CM | POA: Diagnosis not present

## 2022-05-31 DIAGNOSIS — H35353 Cystoid macular degeneration, bilateral: Secondary | ICD-10-CM | POA: Diagnosis not present

## 2022-05-31 DIAGNOSIS — H40053 Ocular hypertension, bilateral: Secondary | ICD-10-CM

## 2022-05-31 DIAGNOSIS — H44113 Panuveitis, bilateral: Secondary | ICD-10-CM

## 2022-05-31 DIAGNOSIS — H40003 Preglaucoma, unspecified, bilateral: Secondary | ICD-10-CM | POA: Diagnosis not present

## 2022-05-31 DIAGNOSIS — I10 Essential (primary) hypertension: Secondary | ICD-10-CM | POA: Diagnosis not present

## 2022-05-31 DIAGNOSIS — R911 Solitary pulmonary nodule: Secondary | ICD-10-CM | POA: Diagnosis not present

## 2022-05-31 DIAGNOSIS — H35033 Hypertensive retinopathy, bilateral: Secondary | ICD-10-CM

## 2022-06-04 ENCOUNTER — Encounter (INDEPENDENT_AMBULATORY_CARE_PROVIDER_SITE_OTHER): Payer: Self-pay | Admitting: Ophthalmology

## 2022-06-08 ENCOUNTER — Ambulatory Visit (INDEPENDENT_AMBULATORY_CARE_PROVIDER_SITE_OTHER): Payer: Medicare Other

## 2022-06-08 DIAGNOSIS — I495 Sick sinus syndrome: Secondary | ICD-10-CM | POA: Diagnosis not present

## 2022-06-08 LAB — CUP PACEART REMOTE DEVICE CHECK
Battery Remaining Longevity: 125 mo
Battery Voltage: 3.02 V
Brady Statistic AP VP Percent: 0.04 %
Brady Statistic AP VS Percent: 81.6 %
Brady Statistic AS VP Percent: 0.01 %
Brady Statistic AS VS Percent: 18.35 %
Brady Statistic RA Percent Paced: 82.32 %
Brady Statistic RV Percent Paced: 0.05 %
Date Time Interrogation Session: 20240109215830
Implantable Lead Connection Status: 753985
Implantable Lead Connection Status: 753985
Implantable Lead Implant Date: 19970416
Implantable Lead Implant Date: 19970416
Implantable Lead Location: 753859
Implantable Lead Location: 753860
Implantable Lead Model: 5034
Implantable Lead Model: 5534
Implantable Pulse Generator Implant Date: 20200113
Lead Channel Impedance Value: 665 Ohm
Lead Channel Impedance Value: 722 Ohm
Lead Channel Impedance Value: 760 Ohm
Lead Channel Impedance Value: 779 Ohm
Lead Channel Pacing Threshold Amplitude: 0.625 V
Lead Channel Pacing Threshold Amplitude: 0.75 V
Lead Channel Pacing Threshold Pulse Width: 0.4 ms
Lead Channel Pacing Threshold Pulse Width: 0.4 ms
Lead Channel Sensing Intrinsic Amplitude: 18.625 mV
Lead Channel Sensing Intrinsic Amplitude: 18.625 mV
Lead Channel Sensing Intrinsic Amplitude: 2.5 mV
Lead Channel Sensing Intrinsic Amplitude: 2.5 mV
Lead Channel Setting Pacing Amplitude: 1.5 V
Lead Channel Setting Pacing Amplitude: 2.5 V
Lead Channel Setting Pacing Pulse Width: 0.4 ms
Lead Channel Setting Sensing Sensitivity: 2 mV
Zone Setting Status: 755011
Zone Setting Status: 755011

## 2022-06-27 DIAGNOSIS — I10 Essential (primary) hypertension: Secondary | ICD-10-CM | POA: Diagnosis not present

## 2022-06-27 IMAGING — CT CT CHEST SUPER D W/O CM
2 of 5 series · 15 of 36 positions shown, 18 images · non-contrast
Comparison: 02/11/2020

CLINICAL DATA: Right lower lobe lung nodule

EXAM:
CT CHEST WITHOUT CONTRAST
TECHNIQUE: Multidetector CT imaging of the chest was performed using thin slice
collimation for electromagnetic bronchoscopy planning purposes,
without intravenous contrast.

[Series 5: thins · axial · 0.69mm/px · z∈[-285,-28]mm · 12 of 372 slices shown, 15 images]
[im 25/372  mediastinal]
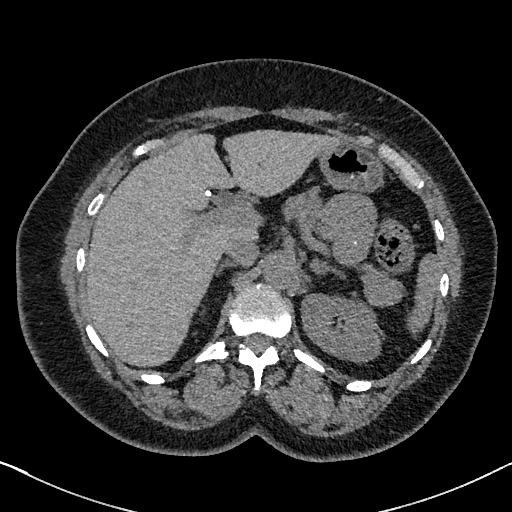
[im 25/372  lung]
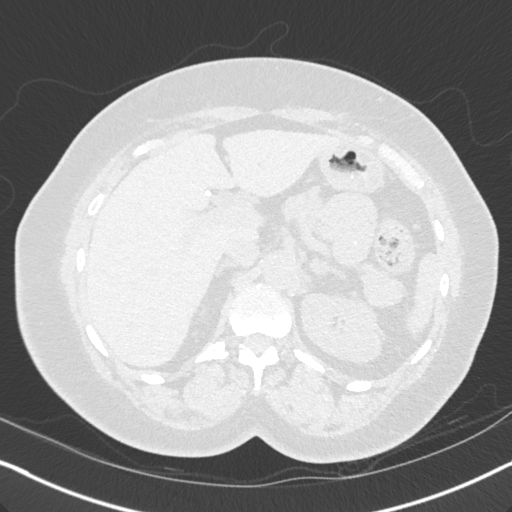
[im 50/372  lung]
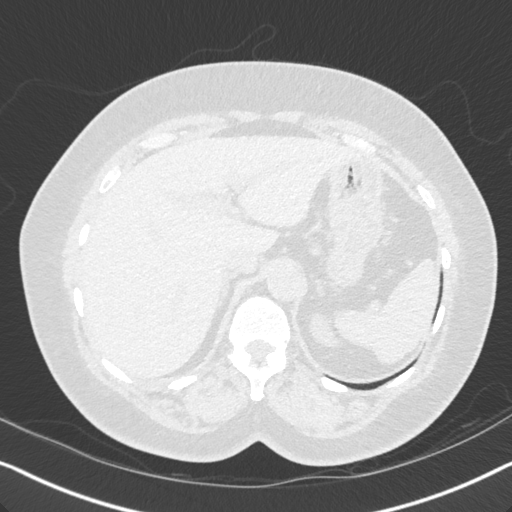
[im 75/372  lung]
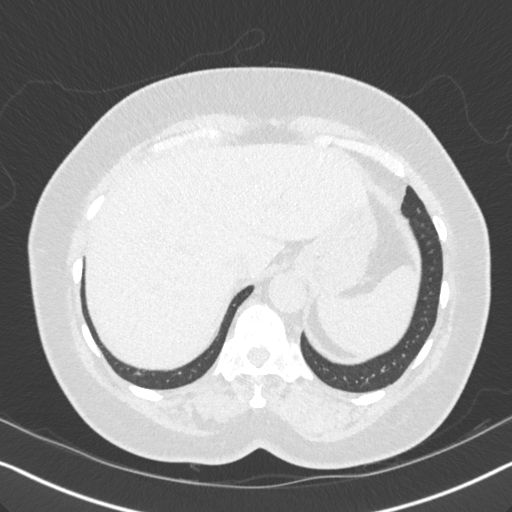
[im 124/372  lung]
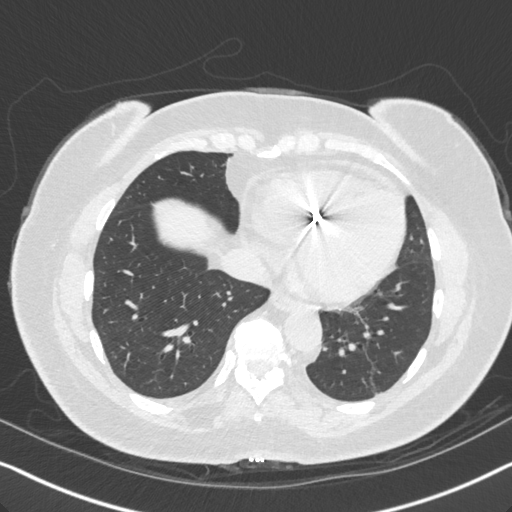
[im 149/372  mediastinal]
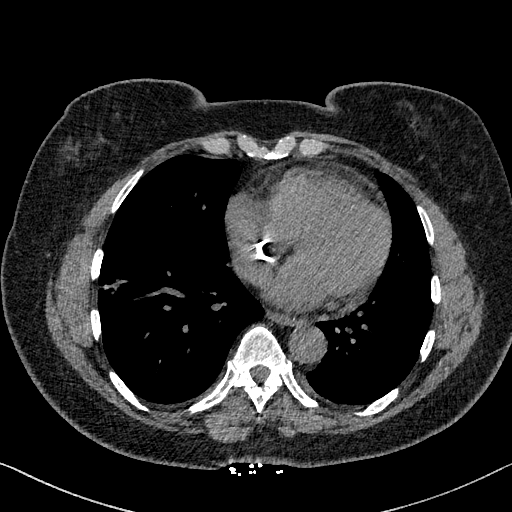
[im 149/372  lung]
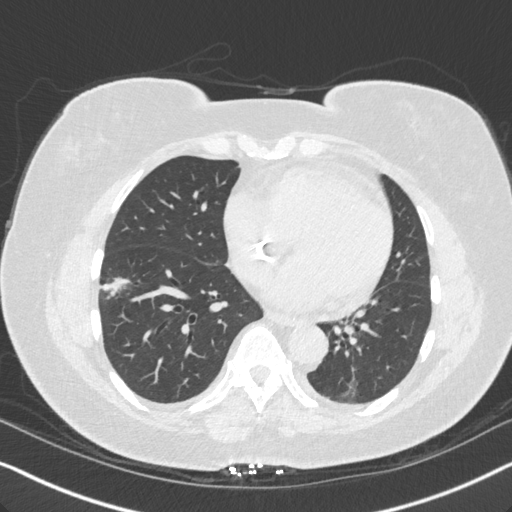
[im 174/372  lung]
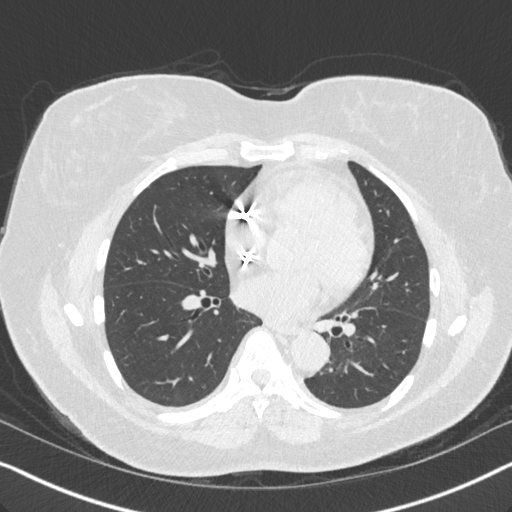
[im 198/372  lung]
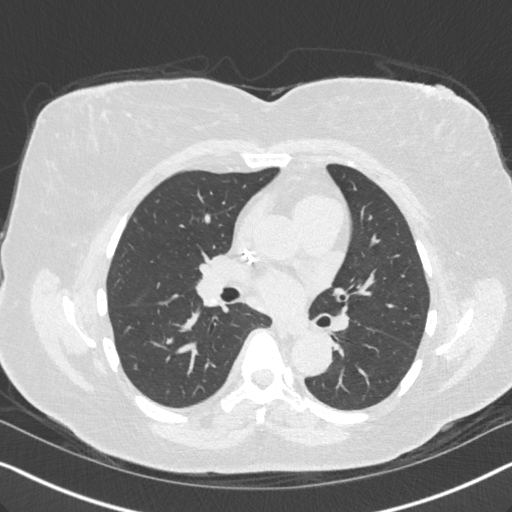
[im 223/372  lung]
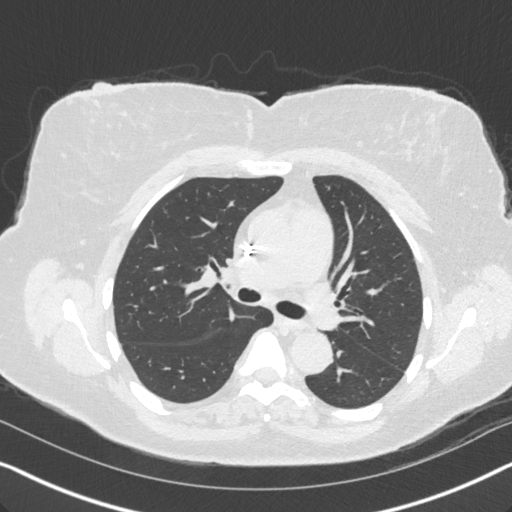
[im 248/372  mediastinal]
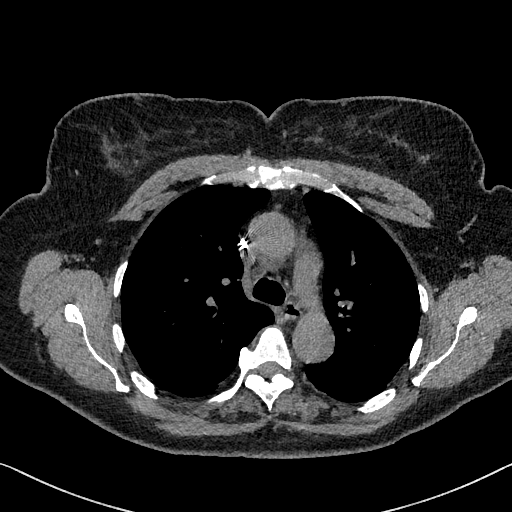
[im 248/372  lung]
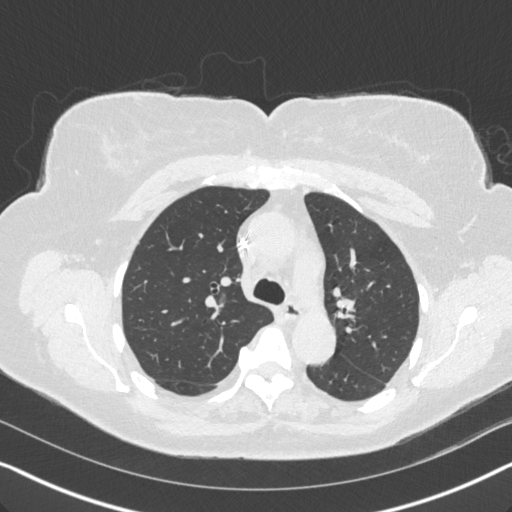
[im 297/372  lung]
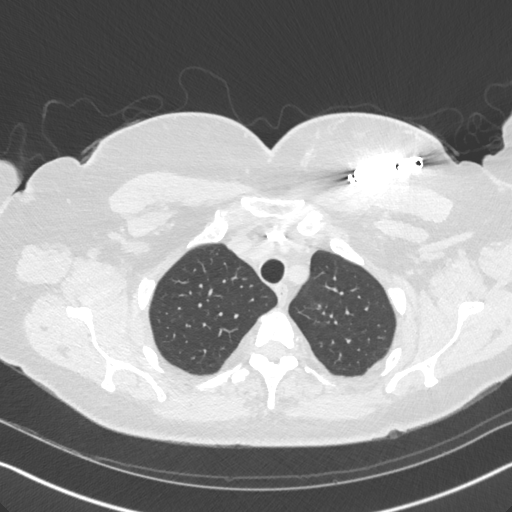
[im 322/372  lung]
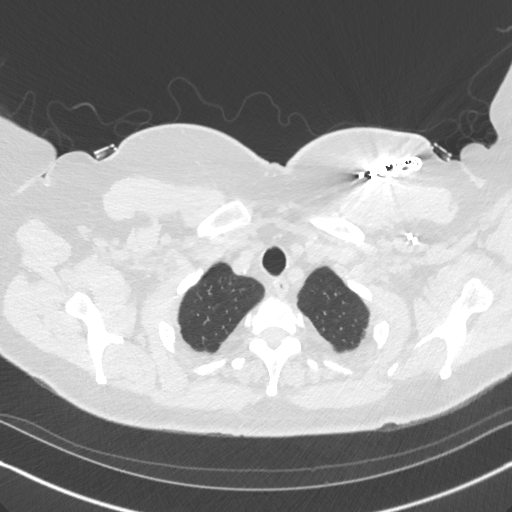
[im 347/372  lung]
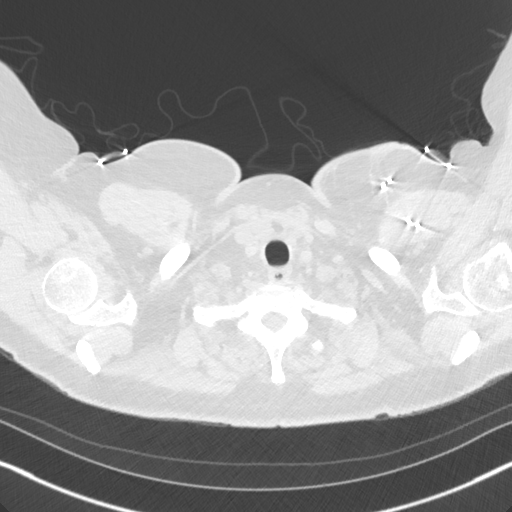

[Series 6: coronal · coronal · 0.56mm/px · 3 of 80 slices shown]
[im 16/80  lung]
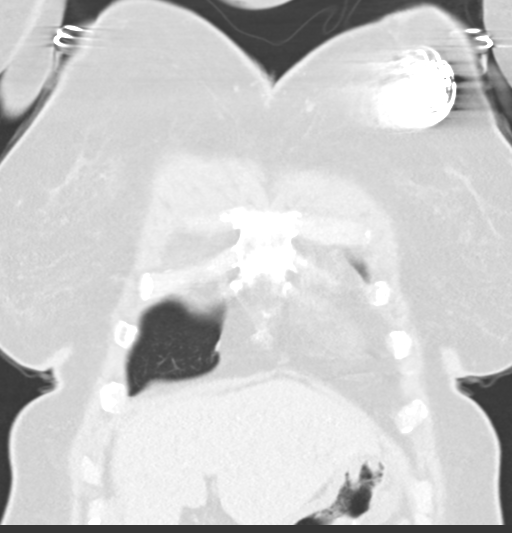
[im 32/80  lung]
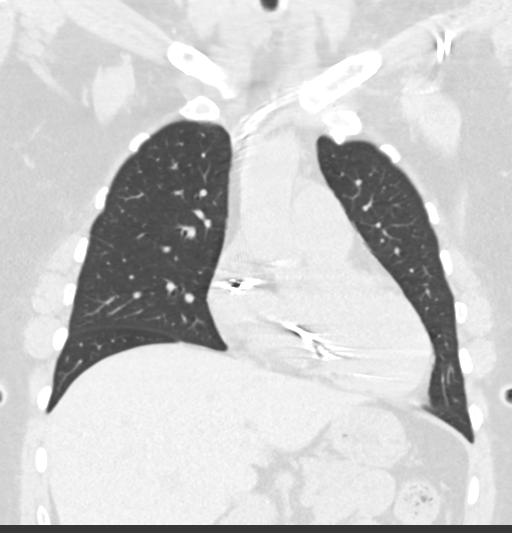
[im 48/80  lung]
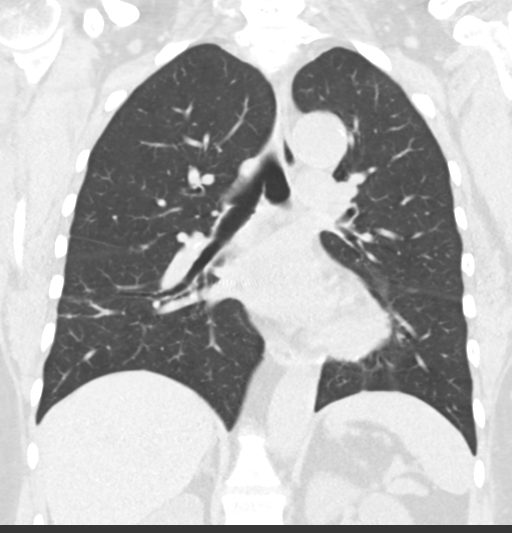

[15 of 36 positions shown; findings below may reference images not displayed]

FINDINGS: Cardiovascular: Heart is normal in size. Trace inferior pericardial
fluid. Left chest pacemaker.

No evidence of thoracic aortic aneurysm. Atherosclerotic
calcifications of the aortic arch.

Mild prominence of the main pulmonary artery, raising the
possibility of pulmonary arterial hypertension.

Mediastinum/Nodes: Small right low paratracheal nodes measuring up
to 7 mm short axis (series 3/image 85), with faint stippled
calcifications, suggesting sequela of prior granulomatous disease. 8
mm short axis subcarinal node (series 3/image 65), unchanged.

Visualized thyroid is unremarkable.

Lungs/Pleura: Very mild biapical pleural-parenchymal scarring.

4 mm subpleural nodule in the posterior right upper lobe along the
major fissure (series 4/image 36), unchanged, likely benign.

13 x 18 mm branching nodular opacity in the anterior right lower
lobe (series 4/images 89-90), with adjacent
peribronchovascular/tree-in-bud nodularity (series 4/image 91),
favored to be mildly improved from the prior. As such, post
infectious/inflammatory nodular scarring is favored.

No focal consolidation.

No pleural effusion or pneumothorax.

Upper Abdomen: Visualized upper abdomen is notable for prior
cholecystectomy and mild vascular calcifications.

Musculoskeletal: Visualized osseous structures are within normal
limits.
IMPRESSION: 13 x 18 mm branching nodular opacity in the anterior right lower
lobe, possibly mildly improved from the prior, with adjacent
peribronchovascular nodularity. While this remains indeterminate,
the overall appearance favors post infectious/inflammatory scarring.

Aortic Atherosclerosis ([id number]).

## 2022-06-28 NOTE — Progress Notes (Shared)
[facility] Note  07/12/2022    CHIEF COMPLAINT Patient presents for No chief complaint on file.  HISTORY OF PRESENT ILLNESS: Sylvia Anthony is a 67 y.o. female who presents to the clinic today for:     Patient states that the vision is better.  Referring physician: Glenda Chroman, MD [place],  [place] [address]  HISTORICAL INFORMATION:   Selected notes from the MEDICAL RECORD NUMBER Referred by [name] for concern of bilateral vitritis.   CURRENT MEDICATIONS: Current Outpatient Medications (Ophthalmic Drugs)  Medication Sig   brimonidine (ALPHAGAN) 0.2 % ophthalmic solution Place 1 drop into both eyes 3 (three) times daily.   Bromfenac Sodium (PROLENSA) 0.07 % SOLN Place 1 drop into the left eye in the morning, at noon, in the evening, and at bedtime.   COSOPT 22.3-6.8 MG/ML ophthalmic solution Place 1 drop into both eyes 2 (two) times daily.   prednisoLONE acetate (PRED FORTE) 1 % ophthalmic suspension Place 1 drop into the left eye 4 (four) times daily.   No current facility-administered medications for this visit. (Ophthalmic Drugs)   Current Outpatient Medications (Other)  Medication Sig   albuterol (PROVENTIL HFA;VENTOLIN HFA) 108 (90 BASE) MCG/ACT inhaler Inhale 2 puffs into the lungs every 6 (six) hours as needed for wheezing or shortness of breath.   amLODipine (NORVASC) 10 MG tablet Take 10 mg by mouth daily.   aspirin EC 81 MG tablet Take 1 tablet (81 mg total) by mouth daily.   carvedilol (COREG) 6.25 MG tablet TAKE ONE TABLET BY MOUTH TWICE DAILY   cetirizine (ZYRTEC) 10 MG tablet Take 10 mg by mouth at bedtime as needed for allergies.    diclofenac (VOLTAREN) 75 MG EC tablet Take 75 mg by mouth daily as needed for mild pain.   ibuprofen (ADVIL) 200 MG tablet Take 400 mg by mouth every 8 (eight) hours as needed (pain.).   levothyroxine (SYNTHROID, LEVOTHROID) 112 MCG tablet Take 112 mcg by mouth daily before breakfast.     Niacin (VITAMIN B-3 PO) Take 1 tablet by mouth in the morning.   nitroGLYCERIN (NITROSTAT) 0.4 MG SL tablet Place 1 tablet (0.4 mg total) under the tongue every 5 (five) minutes as needed for chest pain.   pantoprazole (PROTONIX) 40 MG tablet Take 1 tablet (40 mg total) by mouth 2 (two) times daily before a meal.   potassium chloride SA (K-DUR,KLOR-CON) 20 MEQ tablet Take 20 mEq by mouth in the morning.   sucralfate (CARAFATE) 1 GM/10ML suspension Take 10 mLs (1 g total) by mouth 4 (four) times daily -  with meals and at bedtime.   valsartan-hydrochlorothiazide (DIOVAN-HCT) 320-25 MG per tablet Take 1 tablet by mouth in the morning.   No current facility-administered medications for this visit. (Other)   REVIEW OF SYSTEMS:   ALLERGIES Allergies  Allergen Reactions   Crestor [Rosuvastatin] Swelling and Other (See Comments)    Muscle aches/inflammation   Neurontin [Gabapentin] Swelling    Disoriented/"out of it"   PAST MEDICAL HISTORY Past Medical History:  Diagnosis Date   Anemia    Arm numbness    Cataract    Mixed form OU   Fibromyalgia    GERD (gastroesophageal reflux disease)    Glucose intolerance (impaired glucose tolerance)    H. pylori infection 01/2012   Amoxicillin, Biaxin   Hyperlipidemia    Hypertension    Hypertensive retinopathy    OU   Hypothyroidism    Mitral  valve prolapse    not seen on recent echoes   Rheumatic fever    age 31, was [facility]   SSS (sick sinus syndrome) (HCC)    1997 ppm, gen change 2020 (MDT)   Past Surgical History:  Procedure Laterality Date   ABDOMINAL HYSTERECTOMY     partial   APPENDECTOMY     BALLOON DILATION N/A 10/18/2021   Procedure: BALLOON DILATION;  Surgeon: Lanelle Bal, DO;  [facility];  Service: Endoscopy;  Laterality: N/A;   BIOPSY  10/18/2021   Procedure: BIOPSY;  Surgeon: Lanelle Bal, DO;  [facility];  Service: Endoscopy;;   CARDIAC CATHETERIZATION      multiple   CHOLECYSTECTOMY     COLONOSCOPY  08/01/2006   3 mm descending colon polyp removed/8 mm sessile ascending colon polyp removed / 3-mm rectal  polyp removed /Rare sigmoid diverticulosis/ Moderate internal hemorrhoids.Advanced adenoma on colonoscopy in March 2008.  The polyp was anadenomatous polyp with a foci of high-grade dysplasia   COLONOSCOPY  09/08/2008   SIMPLE ADENOMA/HYPERPLASTIC POLY/Multiple colon polyps (ascending, sigmoid, rectal)  Mild sigmoid colon diverticulosis./ Small internal hemorrhoids   COLONOSCOPY N/A 12/11/2017   Dr. [name]: diverticulosis, hemorrhoids next surveillance tcs 5 years.    COLONOSCOPY WITH ESOPHAGOGASTRODUODENOSCOPY (EGD)  Sept. 30, 2013   QIH:KVQQ diverticulosis was noted in the sigmoid colon/The colon was otherwise normal/Small internal hemorrhoids/EGD:The mucosa of the esophagus appeared normal/Non-erosive gastritis (inflammation) was found; multiple bx/The duodenal mucosa showed no abnormalities. +H.pylori gastritis, treated with equivalent of prevpac. SAVARY DILATION   ESOPHAGEAL DILATION N/A 10/07/2014   Procedure: ESOPHAGEAL DILATION;  Surgeon: West Bali, MD;  [facility]: [facility];  Service: Endoscopy;  Laterality: N/A;   ESOPHAGOGASTRODUODENOSCOPY N/A 10/07/2014   Dr. [name]: moderate non-erosive gastritis, no definite stricture, empiric dilation . negative H.pylori    ESOPHAGOGASTRODUODENOSCOPY (EGD) WITH PROPOFOL N/A 10/18/2021   Procedure: ESOPHAGOGASTRODUODENOSCOPY (EGD) WITH PROPOFOL;  Surgeon: Lanelle Bal, DO;  [facility];  Service: Endoscopy;  Laterality: N/A;  2:30pm   growth removed from intestine     [facility]-as teenager, done through colonoscopy   PACEMAKER GENERATOR CHANGE  09/18/2006   generator change by Dr Amil Amen with a MDT Adapta L   PACEMAKER INSERTION  09/13/1995   for sick sinus syndome and syncope at [facility] GENERATOR CHANGEOUT N/A 06/11/2018    [employer]onic Azure XT DR MRI SureScan model  [id number] (serial number [id number]) pacemaker by Dr Johney Frame   right Achilles tendon     X 2   TONSILLECTOMY     FAMILY HISTORY Family History  Problem Relation Age of Onset   Hyperlipidemia Mother    Hypertension Mother    Heart disease Father    Hypertension Father    Hyperlipidemia Sister    Hypertension Sister    Colon polyps Sister    Hyperlipidemia Brother    Hypertension Brother    Colon polyps Brother    Colon cancer Neg Hx    SOCIAL HISTORY Social History   Tobacco Use   Smoking status: Former    Packs/day: 0.25    Years: 4.00    Total pack years: 1.00    Types: Cigarettes    Quit date: 10/09/1985    Years since quitting: 36.7   Smokeless tobacco: Never   Tobacco comments:    just in teens  Vaping Use   Vaping Use: Never used  Substance Use Topics  Alcohol use: No    Alcohol/week: 0.0 standard drinks of alcohol   Drug use: No       OPHTHALMIC EXAM:  Not recorded    IMAGING AND PROCEDURES  Imaging and Procedures for @TODAY @          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 Cystoid macular edema of both eyes  H35.353     3. Uveitis  H20.9     4. Essential hypertension  I10     5. Hypertensive retinopathy of both eyes  H35.033     6. Pseudophakia, both eyes  Z96.1     7. 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 Glaucoma suspect of both eyes  H40.003       1-3. Mild Panuveitis w/ CME OU  - s/p STK OS #1 (10.10.23) - recurrent CME OS likely related to semi-recent cataract sx (April/May 2023); OD without CME  - delayed to follow up from 10.11.21 to 11.29.22 - at initial presentation, pt reported 1+ mo history of floaters and decreased vision OU; +photophobia  - saw [name], who noted [facility] cell and started PF q1h - initial exam here showed +cell/pigment in [facility] and vitreous cavity; mild vitreous haze and +central CME OU - FA (02.15.21) showed central petaloid  hyperfluorescence and hyperfluorescence of the optic disc  - s/p STK OD (05.25.21)  - pt reports history of fibromyalgia, family history of lupus  - initiated uveitis lab work up -- all WNL except +toxo IgG   CBC, CMP   RPR, VDRL, FTA-Abs, MHA   HIV, Lyme, Quant-Gold   Toxoplasma titers   HLA Panel   ANA   ANCA   ACE, Lysozyme   RF   ESR, CRP   CXR - chest x-ray without TB or pulmonary sarcoidosis, but did reveal a 1.6cm nodular mass in right lung base  - hx of Covid-19 infection ?involvement - repeat FA (08.23.21) showed interval improvement in perifoveal petaloid leakage OU  - OCT shows interval resolution of IRF/CME and SRF  - discussed findings  - dec PF and Prolensa to QD OS   - f/u 6 weeks -- DFE/OCT  4,5. Hypertensive retinopathy OU  - discussed importance of tight BP control  - continue to monitor  6. Pseudophakia OU  - s/p CE/IOL OU (Dr. Lucianne Lei, April and May 2023)  - IOLs in good position, doing well  - continue to monitor  7. 1.6 cm lung nodule of R lung base found on CXR - following with Pulmonology -- lesion shrinking and no interventions have been recommended - PET imaging done on 3.11.21 -- identifying some metabolic lesions (see report below) - CT chest performed 4.14.21 -- indicating mild dec in some dimensions (see report below) - pt saw [name] who had originally scheduled a bronchoscopy, but was cancelled following the CT evidence of decreasing size on CT  - nodule continues to decrease in size on imaging  8,9. 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 Lucianne Lei  Ophthalmic Meds Ordered this visit:  No orders of the defined types were placed in this encounter.  This document serves as a record  of services personally performed by Gardiner Sleeper, MD, PhD. It was created on their behalf  by Renaldo Reel, [place] an ophthalmic technician. The creation of this record is the provider's dictation and/or activities during the visit.    Electronically signed by:  Renaldo Reel, COT  1.30.24 7:52 AM  Gardiner Sleeper, M.D., Ph.D. Diseases & Surgery of the Retina and Vitreous [facility]: M myopia (nearsighted); A astigmatism; H hyperopia (farsighted); P presbyopia; Mrx spectacle prescription;  CTL contact lenses; OD right eye; OS left eye; OU both eyes  XT exotropia; ET esotropia; PEK punctate epithelial keratitis; PEE punctate epithelial erosions; DES dry eye syndrome; MGD meibomian gland dysfunction; ATs artificial tears; PFAT's preservative free artificial tears; [place] nuclear sclerotic cataract; PSC posterior subcapsular cataract; ERM epi-retinal membrane; PVD posterior vitreous detachment; RD retinal detachment; DM diabetes mellitus; DR diabetic retinopathy; NPDR non-proliferative diabetic retinopathy; PDR proliferative diabetic retinopathy; CSME clinically significant macular edema; DME diabetic macular edema; dbh dot blot hemorrhages; CWS cotton wool spot; POAG primary open angle glaucoma; C/D cup-to-disc ratio; HVF humphrey visual field; GVF goldmann visual field; OCT optical coherence tomography; IOP intraocular pressure; BRVO Branch retinal vein occlusion; CRVO central retinal vein occlusion; CRAO central retinal artery occlusion; BRAO branch retinal artery occlusion; RT retinal tear; SB scleral buckle; PPV pars plana vitrectomy; VH Vitreous hemorrhage; PRP panretinal laser photocoagulation; IVK intravitreal kenalog; VMT vitreomacular traction; MH Macular hole;  NVD neovascularization of the disc; NVE neovascularization elsewhere; AREDS age related eye disease study; ARMD age related macular degeneration; POAG primary open angle glaucoma; EBMD epithelial/anterior basement membrane dystrophy; ACIOL anterior chamber intraocular lens; IOL  intraocular lens; PCIOL posterior chamber intraocular lens; Phaco/IOL phacoemulsification with intraocular lens placement; [place] photorefractive keratectomy; LASIK laser assisted in situ keratomileusis; HTN hypertension; DM diabetes mellitus; COPD chronic obstructive pulmonary disease

## 2022-06-30 NOTE — Progress Notes (Signed)
Remote pacemaker transmission.   

## 2022-07-07 NOTE — Progress Notes (Signed)
[facility] Note  07/11/2022    CHIEF COMPLAINT Patient presents for Retina Follow Up  HISTORY OF PRESENT ILLNESS: Sylvia Anthony is a 67 y.o. female who presents to the clinic today for:   HPI     Retina Follow Up   Patient presents with  Other.  In both eyes.  This started 6 weeks ago.  I, the attending physician,  performed the HPI with the patient and updated documentation appropriately.        Comments   Patient here for 6 weeks retina follow up for Panuveitis and CME OU. Patient states vision doing good. No eye pain.      Last edited by [name], MD on 07/12/2022  1:53 AM.       Referring physician: Glenda Chroman, MD [place],  [place] [address]  HISTORICAL INFORMATION:   Selected notes from the MEDICAL RECORD NUMBER Referred by [name] for concern of bilateral vitritis.   CURRENT MEDICATIONS: Current Outpatient Medications (Ophthalmic Drugs)  Medication Sig   brimonidine (ALPHAGAN) 0.2 % ophthalmic solution Place 1 drop into both eyes 3 (three) times daily.   Bromfenac Sodium 0.07 % SOLN Place 1 drop into the left eye daily.   COSOPT 22.3-6.8 MG/ML ophthalmic solution Place 1 drop into both eyes 2 (two) times daily.   prednisoLONE acetate (PRED FORTE) 1 % ophthalmic suspension Place 1 drop into the left eye 4 (four) times daily.   Bromfenac Sodium (PROLENSA) 0.07 % SOLN Place 1 drop into the left eye in the morning, at noon, in the evening, and at bedtime.   No current facility-administered medications for this visit. (Ophthalmic Drugs)   Current Outpatient Medications (Other)  Medication Sig   albuterol (PROVENTIL HFA;VENTOLIN HFA) 108 (90 BASE) MCG/ACT inhaler Inhale 2 puffs into the lungs every 6 (six) hours as needed for wheezing or shortness of breath.   amLODipine (NORVASC) 10 MG tablet Take 10 mg by mouth daily.   aspirin EC 81 MG tablet Take 1 tablet (81 mg total) by mouth daily.   carvedilol (COREG) 6.25  MG tablet TAKE ONE TABLET BY MOUTH TWICE DAILY   cetirizine (ZYRTEC) 10 MG tablet Take 10 mg by mouth at bedtime as needed for allergies.    diclofenac (VOLTAREN) 75 MG EC tablet Take 75 mg by mouth daily as needed for mild pain.   ibuprofen (ADVIL) 200 MG tablet Take 400 mg by mouth every 8 (eight) hours as needed (pain.).   levothyroxine (SYNTHROID, LEVOTHROID) 112 MCG tablet Take 112 mcg by mouth daily before breakfast.    Niacin (VITAMIN B-3 PO) Take 1 tablet by mouth in the morning.   nitroGLYCERIN (NITROSTAT) 0.4 MG SL tablet Place 1 tablet (0.4 mg total) under the tongue every 5 (five) minutes as needed for chest pain.   pantoprazole (PROTONIX) 40 MG tablet Take 1 tablet (40 mg total) by mouth 2 (two) times daily before a meal.   potassium chloride SA (K-DUR,KLOR-CON) 20 MEQ tablet Take 20 mEq by mouth in the morning.   sucralfate (CARAFATE) 1 GM/10ML suspension Take 10 mLs (1 g total) by mouth 4 (four) times daily -  with meals and at bedtime.   valsartan-hydrochlorothiazide (DIOVAN-HCT) 320-25 MG per tablet Take 1 tablet by mouth in the morning.   No current facility-administered medications for this visit. (Other)   REVIEW OF SYSTEMS: ROS   Positive for: Gastrointestinal, Cardiovascular, Eyes Negative for: Constitutional, Neurological, Skin, Genitourinary, Musculoskeletal, HENT,  Endocrine, Respiratory, Psychiatric, Allergic/Imm, Heme/Lymph Last edited by Theodore Demark, COA on 07/11/2022  1:46 PM.      ALLERGIES Allergies  Allergen Reactions   Crestor [Rosuvastatin] Swelling and Other (See Comments)    Muscle aches/inflammation   Neurontin [Gabapentin] Swelling    Disoriented/"out of it"   PAST MEDICAL HISTORY Past Medical History:  Diagnosis Date   Anemia    Arm numbness    Cataract    Mixed form OU   Fibromyalgia    GERD (gastroesophageal reflux disease)    Glucose intolerance (impaired glucose tolerance)    H. pylori infection 01/2012   Amoxicillin, Biaxin    Hyperlipidemia    Hypertension    Hypertensive retinopathy    OU   Hypothyroidism    Mitral valve prolapse    not seen on recent echoes   Rheumatic fever    age 65, was at [facility] for 17 weeks   SSS (sick sinus syndrome) ([place])    1997 ppm, gen change 2020 (MDT)   Past Surgical History:  Procedure Laterality Date   ABDOMINAL HYSTERECTOMY     partial   APPENDECTOMY     BALLOON DILATION N/A 10/18/2021   Procedure: BALLOON DILATION;  Surgeon: Eloise Harman, DO;  [facility];  Service: Endoscopy;  Laterality: N/A;   BIOPSY  10/18/2021   Procedure: BIOPSY;  Surgeon: Eloise Harman, DO;  [facility];  Service: Endoscopy;;   CARDIAC CATHETERIZATION     multiple   CHOLECYSTECTOMY     COLONOSCOPY  08/01/2006   3 mm descending colon polyp removed/8 mm sessile ascending colon polyp removed / 3-mm rectal  polyp removed /Rare sigmoid diverticulosis/ Moderate internal hemorrhoids.Advanced adenoma on colonoscopy in March 2008.  The polyp was anadenomatous polyp with a foci of high-grade dysplasia   COLONOSCOPY  09/08/2008   SIMPLE ADENOMA/HYPERPLASTIC POLY/Multiple colon polyps (ascending, sigmoid, rectal)  Mild sigmoid colon diverticulosis./ Small internal hemorrhoids   COLONOSCOPY N/A 12/11/2017   Dr. Oneida Alar: diverticulosis, hemorrhoids next surveillance tcs 5 years.    COLONOSCOPY WITH ESOPHAGOGASTRODUODENOSCOPY (EGD)  Sept. 30, 2013   [id number] diverticulosis was noted in the sigmoid colon/The colon was otherwise normal/Small internal hemorrhoids/EGD:The mucosa of the esophagus appeared normal/Non-erosive gastritis (inflammation) was found; multiple bx/The duodenal mucosa showed no abnormalities. +H.pylori gastritis, treated with equivalent of prevpac. SAVARY DILATION   ESOPHAGEAL DILATION N/A 10/07/2014   Procedure: ESOPHAGEAL DILATION;  Surgeon: Danie Binder, MD;  [facility];  Service: Endoscopy;  Laterality: N/A;   ESOPHAGOGASTRODUODENOSCOPY  N/A 10/07/2014   Dr. Oneida Alar: moderate non-erosive gastritis, no definite stricture, empiric dilation . negative H.pylori    ESOPHAGOGASTRODUODENOSCOPY (EGD) WITH PROPOFOL N/A 10/18/2021   Procedure: ESOPHAGOGASTRODUODENOSCOPY (EGD) WITH PROPOFOL;  Surgeon: Eloise Harman, DO;  [facility];  Service: Endoscopy;  Laterality: N/A;  2:30pm   growth removed from intestine     [facility]-as teenager, done through colonoscopy   PACEMAKER GENERATOR CHANGE  09/18/2006   generator change by Dr Leonia Reeves with a MDT [place]  09/13/1995   for sick sinus syndome and syncope at [facility] N/A 06/11/2018    Medtronic Azure XT DR MRI SureScan model [id number] (serial number [id number]) pacemaker by Dr Rayann Heman   right Achilles tendon     X 2   TONSILLECTOMY     FAMILY HISTORY Family History  Problem Relation Age of Onset   Hyperlipidemia Mother  Hypertension Mother    Heart disease Father    Hypertension Father    Hyperlipidemia Sister    Hypertension Sister    Colon polyps Sister    Hyperlipidemia Brother    Hypertension Brother    Colon polyps Brother    Colon cancer Neg Hx    SOCIAL HISTORY Social History   Tobacco Use   Smoking status: Former    Packs/day: 0.25    Years: 4.00    Total pack years: 1.00    Types: Cigarettes    Quit date: 10/09/1985    Years since quitting: 36.7   Smokeless tobacco: Never   Tobacco comments:    just in teens  Vaping Use   Vaping Use: Never used  Substance Use Topics   Alcohol use: No    Alcohol/week: 0.0 standard drinks of alcohol   Drug use: No       OPHTHALMIC EXAM:  Base Eye Exam     Visual Acuity (Snellen - Linear)       Right Left   Dist [place] 20/25 -2 20/25 -1         Tonometry (Tonopen, 1:43 PM)       Right Left   Pressure 11 23,23         Pupils       Dark Light Shape React APD   Right 3 2 Round Brisk None   Left 3 2 Round Brisk None         Visual Fields  (Counting fingers)       Left Right    Full Full         Extraocular Movement       Right Left    Full, Ortho Full, Ortho         Neuro/Psych     Oriented x3: Yes   Mood/Affect: Normal         Dilation     Both eyes: 1.0% Mydriacyl, 2.5% Phenylephrine @ 1:43 PM           Slit Lamp and Fundus Exam     Slit Lamp Exam       Right Left   Lids/Lashes Mild Dermatochalasis - upper lid Mild Dermatochalasis - upper lid   Conjunctiva/Sclera Nasal Pinguecula, mild Melanosis, [facility] nansally Nasal Pinguecula, mild Melanosis; STK ST quad   Cornea Mild Arcus, well healed cataract wound, trace PEE Mild Arcus, well healed cataract wound, 1+Punctate epithelial erosions, tear film debris   Anterior Chamber deep, clear, narrow temporal angle, no cell/flare deep, narrow temporal angle, 0.5+fine cell/pigment   Iris Round and dilated Round and dilated   Lens PC IOL in good position PC IOL in good position   Anterior Vitreous Vitreous syneresis, trace cell/pigment Vitreous syneresis, trace cell/pigment         Fundus Exam       Right Left   Disc Pink and Sharp, central cupping and pallor, mild PPP Pink and Sharp   C/D Ratio 0.75 0.65   Macula Flat, good foveal reflex, mild RPE mottling, no heme or edema Flat, good foveal reflex, central CME -- stably resolved, mild Retinal pigment epithelial mottling, no heme   Vessels mild attenuation, mild tortuosity mild attenuation, mild tortuosity, mild copper wiring, mild AV crossing changes   Periphery Attached, no heme, no snowbanking, peripheral pigmented cystoid degeneration inferiory Attached, no heme, no snowbanking, peripheral pigmented cystoid degeneration inferiory           IMAGING AND PROCEDURES  Imaging and  Procedures for @TODAY$ @  OCT, Retina - OU - Both Eyes       Right Eye Quality was good. Central Foveal Thickness: 254. Progression has been stable. Findings include normal foveal contour, no IRF, no SRF (No IRF/CME).    Left Eye Quality was good. Central Foveal Thickness: 249. Progression has improved. Findings include normal foveal contour, no IRF, no SRF (Stable resolution of IRF/CME and SRF).   Notes *Images captured and stored on drive  Diagnosis / Impression:  OD: No IRF/CME OS: Stable resolution of IRF/CME and SRF  Clinical management:  See below  Abbreviations: NFP - Normal foveal profile. CME - cystoid macular edema. PED - pigment epithelial detachment. IRF - intraretinal fluid. SRF - subretinal fluid. EZ - ellipsoid zone. ERM - epiretinal membrane. ORA - outer retinal atrophy. ORT - outer retinal tubulation. SRHM - subretinal hyper-reflective material            ASSESSMENT/PLAN:    ICD-10-CM   1. 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 Cystoid macular edema of both eyes  H35.353     3. Uveitis  H20.9     4. Essential hypertension  I10     5. Hypertensive retinopathy of both eyes  H35.033     6. Pseudophakia, both eyes  Z96.1     7. 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 Glaucoma suspect of both eyes  H40.003      1-3. Mild Panuveitis w/ CME OU  - s/p STK OS #1 (10.10.23) - recurrent CME OS likely related to semi-recent cataract sx (April/May 2023); OD without CME  - delayed to follow up from 10.11.21 to 11.29.22 - at initial presentation, pt reported 1+ mo history of floaters and decreased vision OU; +photophobia  - saw [name], who noted [facility] cell and started PF q1h - initial exam here showed +cell/pigment in [facility] and vitreous cavity; mild vitreous haze and +central CME OU - FA (02.15.21) showed central petaloid hyperfluorescence and hyperfluorescence of the optic disc  - s/p SKT OD 05.25.21  - pt reports history of fibromyalgia, family history of lupus  - initiated uveitis lab work up -- all WNL except +toxo IgG   CBC, CMP   RPR, VDRL, FTA-Abs, MHA   HIV, Lyme, Quant-Gold   Toxoplasma  titers   HLA Panel   ANA   ANCA   ACE, Lysozyme   RF   ESR, CRP   CXR - chest x-ray without TB or pulmonary sarcoidosis, but did reveal a 1.6cm nodular mass in right lung base  - hx of Covid-19 infection ?involvement - repeat FA (08.23.21) showed interval improvement in perifoveal petaloid leakage OU  - OCT shows stable resolution of IRF/CME and SRF  - discussed findings  - stop PredForte -- reduce steroid response  - cont Prolensa qdaily OS  - f/u 6 weeks -- DFE/OCT  4,5. Hypertensive retinopathy OU  - discussed importance of tight BP control  - continue to monitor  6. Pseudophakia OU  - s/p CE/IOL OU (Dr. Lucianne Lei, April and May 2023)  - IOLs in good position, doing well  - continue to monitor  7. 1.6 cm lung nodule of R lung base found on CXR - following with Pulmonology -- lesion shrinking and no interventions have been recommended - PET imaging done on 3.11.21 -- identifying some metabolic lesions (see report  below) - CT chest performed 4.14.21 -- indicating mild dec in some dimensions (see report below) - pt saw [name] who had originally scheduled a bronchoscopy, but was cancelled following the CT evidence of decreasing size on CT  - nodule continues to decrease in size on imaging  8,9. Ocular Hypertension / Glaucoma suspect OU  - IOP 11,23 -- suspect steroid response -- discontinuing PF as above  - +family history  - C/D increased OU -- OD 0.75; OS 0.65  - reviewed instructions: Cosopt BID OU and Brimonidine TID OU  - under the expert management of Dr. Lucianne Lei  Ophthalmic Meds Ordered this visit:  Meds ordered this encounter  Medications   Bromfenac Sodium (PROLENSA) 0.07 % SOLN    Sig: Place 1 drop into the left eye in the morning, at noon, in the evening, and at bedtime.    Dispense:  6 mL    Refill:  6   Bromfenac Sodium 0.07 % SOLN    Sig: Place 1 drop into the left eye daily.    Dispense:  3 mL    Refill:  6   This document serves as a record of services  personally performed by Gardiner Sleeper, MD, PhD. It was created on their behalf by Orvan Falconer, an ophthalmic technician. The creation of this record is the provider's dictation and/or activities during the visit.    Electronically signed by: Orvan Falconer, OA, 07/12/22  1:54 AM  This document serves as a record of services personally performed by Gardiner Sleeper, MD, PhD. It was created on their behalf by San Jetty. Owens Shark, OA an ophthalmic technician. The creation of this record is the provider's dictation and/or activities during the visit.    Electronically signed by: San Jetty. Owens Shark, [place] 02.12.2024 1:54 AM  Gardiner Sleeper, M.D., Ph.D. Diseases & Surgery of the Retina and Vitreous [facility]  I have reviewed the above documentation for accuracy and completeness, and I agree with the above. Gardiner Sleeper, M.D., Ph.D. 07/12/22 1:56 AM   Abbreviations: M myopia (nearsighted); A astigmatism; H hyperopia (farsighted); P presbyopia; Mrx spectacle prescription;  CTL contact lenses; OD right eye; OS left eye; OU both eyes  XT exotropia; ET esotropia; PEK punctate epithelial keratitis; PEE punctate epithelial erosions; DES dry eye syndrome; MGD meibomian gland dysfunction; ATs artificial tears; PFAT's preservative free artificial tears; [place] nuclear sclerotic cataract; PSC posterior subcapsular cataract; ERM epi-retinal membrane; PVD posterior vitreous detachment; RD retinal detachment; DM diabetes mellitus; DR diabetic retinopathy; NPDR non-proliferative diabetic retinopathy; PDR proliferative diabetic retinopathy; CSME clinically significant macular edema; DME diabetic macular edema; dbh dot blot hemorrhages; CWS cotton wool spot; POAG primary open angle glaucoma; C/D cup-to-disc ratio; HVF humphrey visual field; GVF goldmann visual field; OCT optical coherence tomography; IOP intraocular pressure; BRVO Branch retinal vein occlusion; CRVO central retinal vein occlusion;  CRAO central retinal artery occlusion; BRAO branch retinal artery occlusion; RT retinal tear; SB scleral buckle; PPV pars plana vitrectomy; VH Vitreous hemorrhage; PRP panretinal laser photocoagulation; IVK intravitreal kenalog; VMT vitreomacular traction; MH Macular hole;  NVD neovascularization of the disc; NVE neovascularization elsewhere; AREDS age related eye disease study; ARMD age related macular degeneration; POAG primary open angle glaucoma; EBMD epithelial/anterior basement membrane dystrophy; ACIOL anterior chamber intraocular lens; IOL intraocular lens; PCIOL posterior chamber intraocular lens; Phaco/IOL phacoemulsification with intraocular lens placement; [place] photorefractive keratectomy; LASIK laser assisted in situ keratomileusis; HTN hypertension; DM diabetes mellitus; COPD chronic obstructive pulmonary disease

## 2022-07-11 ENCOUNTER — Ambulatory Visit (INDEPENDENT_AMBULATORY_CARE_PROVIDER_SITE_OTHER): Payer: Medicare Other | Admitting: Ophthalmology

## 2022-07-11 ENCOUNTER — Encounter: Payer: Self-pay | Admitting: Cardiology

## 2022-07-11 ENCOUNTER — Ambulatory Visit: Payer: Medicare Other | Attending: Cardiology | Admitting: Cardiology

## 2022-07-11 ENCOUNTER — Encounter (INDEPENDENT_AMBULATORY_CARE_PROVIDER_SITE_OTHER): Payer: Self-pay | Admitting: Ophthalmology

## 2022-07-11 VITALS — BP 118/70 | HR 63 | Ht 64.0 in | Wt 197.8 lb

## 2022-07-11 DIAGNOSIS — I495 Sick sinus syndrome: Secondary | ICD-10-CM | POA: Diagnosis not present

## 2022-07-11 DIAGNOSIS — I10 Essential (primary) hypertension: Secondary | ICD-10-CM | POA: Diagnosis not present

## 2022-07-11 DIAGNOSIS — H35353 Cystoid macular degeneration, bilateral: Secondary | ICD-10-CM

## 2022-07-11 DIAGNOSIS — H40053 Ocular hypertension, bilateral: Secondary | ICD-10-CM

## 2022-07-11 DIAGNOSIS — Z961 Presence of intraocular lens: Secondary | ICD-10-CM

## 2022-07-11 DIAGNOSIS — H209 Unspecified iridocyclitis: Secondary | ICD-10-CM

## 2022-07-11 DIAGNOSIS — R911 Solitary pulmonary nodule: Secondary | ICD-10-CM

## 2022-07-11 DIAGNOSIS — H35033 Hypertensive retinopathy, bilateral: Secondary | ICD-10-CM | POA: Diagnosis not present

## 2022-07-11 DIAGNOSIS — H44113 Panuveitis, bilateral: Secondary | ICD-10-CM

## 2022-07-11 DIAGNOSIS — Z950 Presence of cardiac pacemaker: Secondary | ICD-10-CM

## 2022-07-11 DIAGNOSIS — H40003 Preglaucoma, unspecified, bilateral: Secondary | ICD-10-CM

## 2022-07-11 MED ORDER — PROLENSA 0.07 % OP SOLN: 1.0000 [drp] | Freq: Four times a day (QID) | OPHTHALMIC | 6 refills | Status: DC

## 2022-07-11 MED ORDER — BROMFENAC SODIUM 0.07 % OP SOLN: 1.0000 [drp] | Freq: Every day | OPHTHALMIC | 6 refills | Status: DC

## 2022-07-11 NOTE — Patient Instructions (Signed)
Medication Instructions:  The current medical regimen is effective;  continue present plan and medications.  *If you need a refill on your cardiac medications before your next appointment, please call your pharmacy*  Follow-Up: At [facility], you and your health needs are our priority.  As part of our continuing mission to provide you with exceptional heart care, we have created designated Provider Care Teams.  These Care Teams include your primary Cardiologist (physician) and Advanced Practice Providers (APPs -  Physician Assistants and Nurse Practitioners) who all work together to provide you with the care you need, when you need it.  We recommend signing up for the patient portal called "MyChart".  Sign up information is provided on this After Visit Summary.  MyChart is used to connect with patients for Virtual Visits (Telemedicine).  Patients are able to view lab/test results, encounter notes, upcoming appointments, etc.  Non-urgent messages can be sent to your provider as well.   To learn more about what you can do with MyChart, go to [email or web address].    Your next appointment:   1 year(s)  Provider:   Candee Furbish, MD

## 2022-07-11 NOTE — Progress Notes (Signed)
Cardiology Office Note:    Date:  07/11/2022   ID:  Sylvia Anthony, DOB 1956/02/03, MRN [id number]  PCP:  Glenda Chroman, MD   [facility] Cardiologist:  Candee Furbish, MD Electrophysiologist:  Thompson Grayer, MD (Inactive)     Referring MD: Glenda Chroman, MD    History of Present Illness:    Sylvia Anthony is a 67 y.o. female former patient of Dr. Mallie Mussel Smith's here for follow-up sick sinus syndrome permanent pacemaker hypertension with chronic dyspnea.  Back in October 2022, LDL was greater than 170.  LDL prior to that was 86 in June.  She was on rosuvastatin 20 mg a day but this was stopped because of some muscle aches.  Niacin and B3 now. Off of Crestor. LDL 132  Back at age 34 she had rheumatic fever and was at [facility] for 17 weeks.  In 1997 a pacemaker was placed for sick sinus syndrome she had a generator change out [employer]tr[employer] in 2020.  She was previously diagnosed with mitral valve prolapse however she does not any evidence of this on most recent echocardiograms.  Overall she feels well.  She lives in [place].  Denies any fevers chills nausea vomiting syncope bleeding.  Past Medical History:  Diagnosis Date   Anemia    Arm numbness    Cataract    Mixed form OU   Fibromyalgia    GERD (gastroesophageal reflux disease)    Glucose intolerance (impaired glucose tolerance)    H. pylori infection 01/2012   Amoxicillin, Biaxin   Hyperlipidemia    Hypertension    Hypertensive retinopathy    OU   Hypothyroidism    Mitral valve prolapse    not seen on recent echoes   Rheumatic fever    age 47, was at [facility] for 17 weeks   SSS (sick sinus syndrome) ([place])    1997 ppm, gen change 2020 (MDT)    Past Surgical History:  Procedure Laterality Date   ABDOMINAL HYSTERECTOMY     partial   APPENDECTOMY     BALLOON DILATION N/A 10/18/2021   Procedure: BALLOON DILATION;  Surgeon: Eloise Harman, DO;  [facility];  Service: Endoscopy;   Laterality: N/A;   BIOPSY  10/18/2021   Procedure: BIOPSY;  Surgeon: Eloise Harman, DO;  [facility];  Service: Endoscopy;;   CARDIAC CATHETERIZATION     multiple   CHOLECYSTECTOMY     COLONOSCOPY  08/01/2006   3 mm descending colon polyp removed/8 mm sessile ascending colon polyp removed / 3-mm rectal  polyp removed /Rare sigmoid diverticulosis/ Moderate internal hemorrhoids.Advanced adenoma on colonoscopy in March 2008.  The polyp was anadenomatous polyp with a foci of high-grade dysplasia   COLONOSCOPY  09/08/2008   SIMPLE ADENOMA/HYPERPLASTIC POLY/Multiple colon polyps (ascending, sigmoid, rectal)  Mild sigmoid colon diverticulosis./ Small internal hemorrhoids   COLONOSCOPY N/A 12/11/2017   Dr. Oneida Alar: diverticulosis, hemorrhoids next surveillance tcs 5 years.    COLONOSCOPY WITH ESOPHAGOGASTRODUODENOSCOPY (EGD)  Sept. 30, 2013   [id number] diverticulosis was noted in the sigmoid colon/The colon was otherwise normal/Small internal hemorrhoids/EGD:The mucosa of the esophagus appeared normal/Non-erosive gastritis (inflammation) was found; multiple bx/The duodenal mucosa showed no abnormalities. +H.pylori gastritis, treated with equivalent of prevpac. SAVARY DILATION   ESOPHAGEAL DILATION N/A 10/07/2014   Procedure: ESOPHAGEAL DILATION;  Surgeon: Danie Binder, MD;  [facility];  Service: Endoscopy;  Laterality: N/A;   ESOPHAGOGASTRODUODENOSCOPY N/A 10/07/2014   Dr.  Fields: moderate non-erosive gastritis, no definite stricture, empiric dilation . negative H.pylori    ESOPHAGOGASTRODUODENOSCOPY (EGD) WITH PROPOFOL N/A 10/18/2021   Procedure: ESOPHAGOGASTRODUODENOSCOPY (EGD) WITH PROPOFOL;  Surgeon: Eloise Harman, DO;  [facility];  Service: Endoscopy;  Laterality: N/A;  2:30pm   growth removed from intestine     [facility]-as teenager, done through colonoscopy   PACEMAKER GENERATOR CHANGE  09/18/2006   generator change by Dr Leonia Reeves with a MDT [place]  09/13/1995   for sick sinus syndome and syncope at [place] N/A 06/11/2018    Medtronic Azure XT DR MRI SureScan model [id number] (serial number [id number]) pacemaker by Dr Rayann Heman   right Achilles tendon     X 2   TONSILLECTOMY      Current Medications: Current Meds  Medication Sig   albuterol (PROVENTIL HFA;VENTOLIN HFA) 108 (90 BASE) MCG/ACT inhaler Inhale 2 puffs into the lungs every 6 (six) hours as needed for wheezing or shortness of breath.   amLODipine (NORVASC) 10 MG tablet Take 10 mg by mouth daily.   aspirin EC 81 MG tablet Take 1 tablet (81 mg total) by mouth daily.   brimonidine (ALPHAGAN) 0.2 % ophthalmic solution Place 1 drop into both eyes 3 (three) times daily.   Bromfenac Sodium (PROLENSA) 0.07 % SOLN Place 1 drop into the left eye in the morning, at noon, in the evening, and at bedtime.   carvedilol (COREG) 6.25 MG tablet TAKE ONE TABLET BY MOUTH TWICE DAILY   cetirizine (ZYRTEC) 10 MG tablet Take 10 mg by mouth at bedtime as needed for allergies.    COSOPT 22.3-6.8 MG/ML ophthalmic solution Place 1 drop into both eyes 2 (two) times daily.   diclofenac (VOLTAREN) 75 MG EC tablet Take 75 mg by mouth daily as needed for mild pain.   ibuprofen (ADVIL) 200 MG tablet Take 400 mg by mouth every 8 (eight) hours as needed (pain.).   levothyroxine (SYNTHROID, LEVOTHROID) 112 MCG tablet Take 112 mcg by mouth daily before breakfast.    Niacin (VITAMIN B-3 PO) Take 1 tablet by mouth in the morning.   nitroGLYCERIN (NITROSTAT) 0.4 MG SL tablet Place 1 tablet (0.4 mg total) under the tongue every 5 (five) minutes as needed for chest pain.   pantoprazole (PROTONIX) 40 MG tablet Take 1 tablet (40 mg total) by mouth 2 (two) times daily before a meal.   potassium chloride SA (K-DUR,KLOR-CON) 20 MEQ tablet Take 20 mEq by mouth in the morning.   prednisoLONE acetate (PRED FORTE) 1 % ophthalmic suspension Place 1 drop into the left eye 4  (four) times daily.   sucralfate (CARAFATE) 1 GM/10ML suspension Take 10 mLs (1 g total) by mouth 4 (four) times daily -  with meals and at bedtime.   valsartan-hydrochlorothiazide (DIOVAN-HCT) 320-25 MG per tablet Take 1 tablet by mouth in the morning.     Allergies:   Crestor [rosuvastatin] and Neurontin [gabapentin]   Social History   Socioeconomic History   Marital status: Married    Spouse name: Not on file   Number of children: 1   Years of education: Not on file   Highest education level: Not on file  Occupational History   Occupation: part-time hairdresser  Tobacco Use   Smoking status: Former    Packs/day: 0.25    Years: 4.00    Total pack years: 1.00    Types: Cigarettes    Quit date: 10/09/1985  Years since quitting: 36.7   Smokeless tobacco: Never   Tobacco comments:    just in teens  Vaping Use   Vaping Use: Never used  Substance and Sexual Activity   Alcohol use: No    Alcohol/week: 0.0 standard drinks of alcohol   Drug use: No   Sexual activity: Not Currently  Other Topics Concern   Not on file  Social History Narrative   Lives in [place] with spouse.  Son is healthy at age 69.   Disabled         Social Determinants of Health   Financial Resource Strain: Not on file  Food Insecurity: Not on file  Transportation Needs: Not on file  Physical Activity: Not on file  Stress: Not on file  Social Connections: Not on file     Family History: The patient's family history includes Colon polyps in her brother and sister; Heart disease in her father; Hyperlipidemia in her brother, mother, and sister; Hypertension in her brother, father, mother, and sister. There is no history of Colon cancer.  ROS:   Please see the history of present illness.     All other systems reviewed and are negative.  EKGs/Labs/Other Studies Reviewed:    The following studies were reviewed today:  2D Doppler Echocardiogram 12/14/2020: IMPRESSIONS   1. Left ventricular ejection  fraction, by estimation, is 65 to 70%. Left  ventricular ejection fraction by 3D volume is 70 %. The left ventricle has  normal function. The left ventricle has no regional wall motion  abnormalities. There is mild concentric  left ventricular hypertrophy. Left ventricular diastolic parameters are  consistent with Grade I diastolic dysfunction (impaired relaxation). The  average left ventricular global longitudinal strain is -23.7 %. The global  longitudinal strain is normal.   2. Right ventricular systolic function is normal. The right ventricular  size is normal.   3. The mitral valve is abnormal. Trivial mitral valve regurgitation. The  mean mitral valve gradient is 2.0 mmHg with average heart rate of 63 bpm.  Moderate mitral annular calcification.   4. The aortic valve is tricuspid. There is mild calcification of the  aortic valve. There is mild thickening of the aortic valve. Aortic valve  regurgitation is not visualized. No aortic stenosis is present.   5. The inferior vena cava is normal in size with greater than 50%  respiratory variability, suggesting right atrial pressure of 3 mmHg.    EKG:   07/11/2022-atrial pacing intrinsic narrow complex QRS, T wave inversion diffusely.  Similar to prior EKG.  Recent Labs: 10/12/2021: BUN 10; Creatinine, Ser 0.88; Hemoglobin 14.3; Platelets 265; Potassium 2.9; Sodium 140  Recent Lipid Panel No results found for: "CHOL", "TRIG", "HDL", "CHOLHDL", "VLDL", "LDLCALC", "LDLDIRECT"   Risk Assessment/Calculations:               Physical Exam:    VS:  BP 118/70   Pulse 63   Ht 5' 4"$  (1.626 m)   Wt 197 lb 12.8 oz (89.7 kg)   SpO2 97%   BMI 33.95 kg/m     Wt Readings from Last 3 Encounters:  07/11/22 197 lb 12.8 oz (89.7 kg)  12/08/21 200 lb 9.6 oz (91 kg)  10/12/21 196 lb 3.2 oz (89 kg)     GEN:  Well nourished, well developed in no acute distress HEENT: Normal NECK: No JVD; No carotid bruits LYMPHATICS: No  lymphadenopathy CARDIAC: RRR, no murmurs, rubs, gallops RESPIRATORY:  Clear to auscultation without rales, wheezing or  rhonchi  ABDOMEN: Soft, non-tender, non-distended MUSCULOSKELETAL:  No edema; No deformity  SKIN: Warm and dry NEUROLOGIC:  Alert and oriented x 3 PSYCHIATRIC:  Normal affect   ASSESSMENT:    1. Essential hypertension   2. Sick sinus syndrome ([place])   3. Pacemaker    PLAN:    In order of problems listed above:  Hypertension - Well-controlled on current medications as listed above.  Multidrug regimen.  Doing well.  Continue with diet and exercise.  150 minutes a week.  Hyperlipidemia - Now on niacin and D3 supplement.  She stopped her Crestor because of myalgias.  Her latest LDL was 132.  Continue with current diet and exercise as well as supplement.  She has no prior history of CAD or stroke.  Permanent pacemaker - Doing well stable.  She has used to see Dr. Rayann Heman.  She will be seeing a new EP for device follow-up.  [employer]tr[employer].  Atrial pacing.  Functioning well.          Medication Adjustments/Labs and Tests Ordered: Current medicines are reviewed at length with the patient today.  Concerns regarding medicines are outlined above.  Orders Placed This Encounter  Procedures   EKG 12-Lead   No orders of the defined types were placed in this encounter.   Patient Instructions  Medication Instructions:  The current medical regimen is effective;  continue present plan and medications.  *If you need a refill on your cardiac medications before your next appointment, please call your pharmacy*  Follow-Up: At [facility], you and your health needs are our priority.  As part of our continuing mission to provide you with exceptional heart care, we have created designated Provider Care Teams.  These Care Teams include your primary Cardiologist (physician) and Advanced Practice Providers (APPs -  Physician Assistants and Nurse Practitioners) who all work  together to provide you with the care you need, when you need it.  We recommend signing up for the patient portal called "MyChart".  Sign up information is provided on this After Visit Summary.  MyChart is used to connect with patients for Virtual Visits (Telemedicine).  Patients are able to view lab/test results, encounter notes, upcoming appointments, etc.  Non-urgent messages can be sent to your provider as well.   To learn more about what you can do with MyChart, go to [email or web address].    Your next appointment:   1 year(s)  Provider:   Candee Furbish, MD        Signed, Candee Furbish, MD  07/11/2022 11:07 AM    [place]

## 2022-07-12 ENCOUNTER — Encounter (INDEPENDENT_AMBULATORY_CARE_PROVIDER_SITE_OTHER): Payer: Self-pay | Admitting: Ophthalmology

## 2022-07-12 ENCOUNTER — Encounter (INDEPENDENT_AMBULATORY_CARE_PROVIDER_SITE_OTHER): Payer: Medicare Other | Admitting: Ophthalmology

## 2022-07-12 DIAGNOSIS — H209 Unspecified iridocyclitis: Secondary | ICD-10-CM

## 2022-07-12 DIAGNOSIS — H44113 Panuveitis, bilateral: Secondary | ICD-10-CM

## 2022-07-12 DIAGNOSIS — H35353 Cystoid macular degeneration, bilateral: Secondary | ICD-10-CM

## 2022-07-12 DIAGNOSIS — H35033 Hypertensive retinopathy, bilateral: Secondary | ICD-10-CM

## 2022-07-12 DIAGNOSIS — I10 Essential (primary) hypertension: Secondary | ICD-10-CM

## 2022-07-12 DIAGNOSIS — Z961 Presence of intraocular lens: Secondary | ICD-10-CM

## 2022-07-12 DIAGNOSIS — H40003 Preglaucoma, unspecified, bilateral: Secondary | ICD-10-CM

## 2022-07-12 DIAGNOSIS — H40053 Ocular hypertension, bilateral: Secondary | ICD-10-CM

## 2022-07-12 DIAGNOSIS — R911 Solitary pulmonary nodule: Secondary | ICD-10-CM

## 2022-07-27 DIAGNOSIS — I10 Essential (primary) hypertension: Secondary | ICD-10-CM | POA: Diagnosis not present

## 2022-08-02 DIAGNOSIS — H35031 Hypertensive retinopathy, right eye: Secondary | ICD-10-CM | POA: Diagnosis not present

## 2022-08-02 DIAGNOSIS — Z961 Presence of intraocular lens: Secondary | ICD-10-CM | POA: Diagnosis not present

## 2022-08-02 DIAGNOSIS — H401134 Primary open-angle glaucoma, bilateral, indeterminate stage: Secondary | ICD-10-CM | POA: Diagnosis not present

## 2022-08-02 DIAGNOSIS — H11153 Pinguecula, bilateral: Secondary | ICD-10-CM | POA: Diagnosis not present

## 2022-08-16 DIAGNOSIS — R109 Unspecified abdominal pain: Secondary | ICD-10-CM | POA: Diagnosis not present

## 2022-08-16 DIAGNOSIS — Z299 Encounter for prophylactic measures, unspecified: Secondary | ICD-10-CM | POA: Diagnosis not present

## 2022-08-16 DIAGNOSIS — R319 Hematuria, unspecified: Secondary | ICD-10-CM | POA: Diagnosis not present

## 2022-08-16 DIAGNOSIS — I10 Essential (primary) hypertension: Secondary | ICD-10-CM | POA: Diagnosis not present

## 2022-08-18 NOTE — Progress Notes (Signed)
[facility] Note  08/22/2022    CHIEF COMPLAINT Patient presents for Retina Follow Up  HISTORY OF PRESENT ILLNESS: Sylvia Anthony is a 67 y.o. female who presents to the clinic today for:   HPI     Retina Follow Up   Patient presents with  Other.  In both eyes.  Severity is moderate.  Duration of 6 weeks.  Since onset it is stable.  I, the attending physician,  performed the HPI with the patient and updated documentation appropriately.        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 Pt states VA the same, no changes. She did see Dr. Lucianne Lei this morning and had a laser for pressure on OS. Per Dr. Lucianne Lei, pt is taking pred forte gtt QHS OU. Pt is out of Prolensa.       Last edited by [name], MD on 08/22/2022 12:55 PM.        Referring physician: Glenda Chroman, MD [place],   [id number]  HISTORICAL INFORMATION:   Selected notes from the MEDICAL RECORD NUMBER Referred by [name] for concern of bilateral vitritis.   CURRENT MEDICATIONS: Current Outpatient Medications (Ophthalmic Drugs)  Medication Sig   brimonidine (ALPHAGAN) 0.2 % ophthalmic solution Place 1 drop into both eyes 3 (three) times daily.   Bromfenac Sodium (PROLENSA) 0.07 % SOLN Place 1 drop into the left eye in the morning, at noon, in the evening, and at bedtime.   Bromfenac Sodium 0.07 % SOLN Place 1 drop into the left eye daily.   COSOPT 22.3-6.8 MG/ML ophthalmic solution Place 1 drop into both eyes 2 (two) times daily.   prednisoLONE acetate (PRED FORTE) 1 % ophthalmic suspension Place 1 drop into the left eye 4 (four) times daily. (Patient taking differently: Place 1 drop into both eyes at bedtime. Per Dr. Lucianne Lei.)   No current facility-administered medications for this visit. (Ophthalmic Drugs)   Current Outpatient Medications (Other)  Medication Sig   albuterol (PROVENTIL HFA;VENTOLIN HFA) 108 (90 BASE) MCG/ACT inhaler Inhale 2 puffs into the  lungs every 6 (six) hours as needed for wheezing or shortness of breath.   amLODipine (NORVASC) 10 MG tablet Take 10 mg by mouth daily.   aspirin EC 81 MG tablet Take 1 tablet (81 mg total) by mouth daily.   carvedilol (COREG) 6.25 MG tablet TAKE ONE TABLET BY MOUTH TWICE DAILY   cetirizine (ZYRTEC) 10 MG tablet Take 10 mg by mouth at bedtime as needed for allergies.    diclofenac (VOLTAREN) 75 MG EC tablet Take 75 mg by mouth daily as needed for mild pain.   ibuprofen (ADVIL) 200 MG tablet Take 400 mg by mouth every 8 (eight) hours as needed (pain.).   levothyroxine (SYNTHROID, LEVOTHROID) 112 MCG tablet Take 112 mcg by mouth daily before breakfast.    Niacin (VITAMIN B-3 PO) Take 1 tablet by mouth in the morning.   nitroGLYCERIN (NITROSTAT) 0.4 MG SL tablet Place 1 tablet (0.4 mg total) under the tongue every 5 (five) minutes as needed for chest pain.   pantoprazole (PROTONIX) 40 MG tablet Take 1 tablet (40 mg total) by mouth 2 (two) times daily before a meal.   potassium chloride SA (K-DUR,KLOR-CON) 20 MEQ tablet Take 20 mEq by mouth in the morning.   sucralfate (CARAFATE) 1 GM/10ML suspension Take 10 mLs (1 g total) by mouth 4 (four) times daily -  with meals  and at bedtime.   valsartan-hydrochlorothiazide (DIOVAN-HCT) 320-25 MG per tablet Take 1 tablet by mouth in the morning.   No current facility-administered medications for this visit. (Other)   REVIEW OF SYSTEMS: ROS   Positive for: Gastrointestinal, Cardiovascular, Eyes Negative for: Constitutional, Neurological, Skin, Genitourinary, Musculoskeletal, HENT, Endocrine, Respiratory, Psychiatric, Allergic/Imm, Heme/Lymph Last edited by Kingsley Spittle, COT on 08/22/2022  8:36 AM.     ALLERGIES Allergies  Allergen Reactions   Crestor [Rosuvastatin] Swelling and Other (See Comments)    Muscle aches/inflammation   Neurontin [Gabapentin] Swelling    Disoriented/"out of it"   PAST MEDICAL HISTORY Past Medical History:  Diagnosis  Date   Anemia    Arm numbness    Cataract    Mixed form OU   Fibromyalgia    GERD (gastroesophageal reflux disease)    Glucose intolerance (impaired glucose tolerance)    H. pylori infection 01/2012   Amoxicillin, Biaxin   Hyperlipidemia    Hypertension    Hypertensive retinopathy    OU   Hypothyroidism    Mitral valve prolapse    not seen on recent echoes   Rheumatic fever    age 5, was at [facility] for 17 weeks   SSS (sick sinus syndrome) ([place])    1997 ppm, gen change 2020 (MDT)   Past Surgical History:  Procedure Laterality Date   ABDOMINAL HYSTERECTOMY     partial   APPENDECTOMY     BALLOON DILATION N/A 10/18/2021   Procedure: BALLOON DILATION;  Surgeon: Eloise Harman, DO;  [facility];  Service: Endoscopy;  Laterality: N/A;   BIOPSY  10/18/2021   Procedure: BIOPSY;  Surgeon: Eloise Harman, DO;  [facility];  Service: Endoscopy;;   CARDIAC CATHETERIZATION     multiple   CHOLECYSTECTOMY     COLONOSCOPY  08/01/2006   3 mm descending colon polyp removed/8 mm sessile ascending colon polyp removed / 3-mm rectal  polyp removed /Rare sigmoid diverticulosis/ Moderate internal hemorrhoids.Advanced adenoma on colonoscopy in March 2008.  The polyp was anadenomatous polyp with a foci of high-grade dysplasia   COLONOSCOPY  09/08/2008   SIMPLE ADENOMA/HYPERPLASTIC POLY/Multiple colon polyps (ascending, sigmoid, rectal)  Mild sigmoid colon diverticulosis./ Small internal hemorrhoids   COLONOSCOPY N/A 12/11/2017   Dr. Oneida Alar: diverticulosis, hemorrhoids next surveillance tcs 5 years.    COLONOSCOPY WITH ESOPHAGOGASTRODUODENOSCOPY (EGD)  Sept. 30, 2013   [id number] diverticulosis was noted in the sigmoid colon/The colon was otherwise normal/Small internal hemorrhoids/EGD:The mucosa of the esophagus appeared normal/Non-erosive gastritis (inflammation) was found; multiple bx/The duodenal mucosa showed no abnormalities. +H.pylori gastritis, treated with  equivalent of prevpac. SAVARY DILATION   ESOPHAGEAL DILATION N/A 10/07/2014   Procedure: ESOPHAGEAL DILATION;  Surgeon: Danie Binder, MD;  [facility];  Service: Endoscopy;  Laterality: N/A;   ESOPHAGOGASTRODUODENOSCOPY N/A 10/07/2014   Dr. Oneida Alar: moderate non-erosive gastritis, no definite stricture, empiric dilation . negative H.pylori    ESOPHAGOGASTRODUODENOSCOPY (EGD) WITH PROPOFOL N/A 10/18/2021   Procedure: ESOPHAGOGASTRODUODENOSCOPY (EGD) WITH PROPOFOL;  Surgeon: Eloise Harman, DO;  [facility];  Service: Endoscopy;  Laterality: N/A;  2:30pm   growth removed from intestine     [facility]-as teenager, done through colonoscopy   PACEMAKER GENERATOR CHANGE  09/18/2006   generator change by Dr Leonia Reeves with a MDT [place]  09/13/1995   for sick sinus syndome and syncope at [facility] N/A 06/11/2018  Medtronic Azure XT DR MRI SureScan model [id number] (serial number [id number]) pacemaker by Dr Rayann Heman   right Achilles tendon     X 2   TONSILLECTOMY     FAMILY HISTORY Family History  Problem Relation Age of Onset   Hyperlipidemia Mother    Hypertension Mother    Heart disease Father    Hypertension Father    Hyperlipidemia Sister    Hypertension Sister    Colon polyps Sister    Hyperlipidemia Brother    Hypertension Brother    Colon polyps Brother    Colon cancer Neg Hx    SOCIAL HISTORY Social History   Tobacco Use   Smoking status: Former    Packs/day: 0.25    Years: 4.00    Additional pack years: 0.00    Total pack years: 1.00    Types: Cigarettes    Quit date: 10/09/1985    Years since quitting: 36.8   Smokeless tobacco: Never   Tobacco comments:    just in teens  Vaping Use   Vaping Use: Never used  Substance Use Topics   Alcohol use: No    Alcohol/week: 0.0 standard drinks of alcohol   Drug use: No       OPHTHALMIC EXAM:  Base Eye Exam     Visual Acuity (Snellen - Linear)        Right Left   Dist [place] 20/25 -1 20/25 -3   Dist ph [place] 20/20 -2 NI         Tonometry (Tonopen, 8:44 AM)       Right Left   Pressure 10 22         Pupils       Pupils Dark Light Shape React APD   Right PERRL 3 2 Round Brisk None   Left PERRL 3 2 Round Brisk None         Visual Fields (Counting fingers)       Left Right    Full Full         Extraocular Movement       Right Left    Full, Ortho Full, Ortho         Neuro/Psych     Oriented x3: Yes   Mood/Affect: Normal         Dilation     Both eyes: 1.0% Mydriacyl, 2.5% Phenylephrine @ 8:45 AM           Slit Lamp and Fundus Exam     Slit Lamp Exam       Right Left   Lids/Lashes Mild Dermatochalasis - upper lid Mild Dermatochalasis - upper lid   Conjunctiva/Sclera Nasal Pinguecula, mild Melanosis, [place] nansally Nasal Pinguecula, mild Melanosis; STK ST quad, 2+ Injection   Cornea Mild Arcus, well healed cataract wound, trace PEE Mild Arcus, well healed cataract wound, trace Punctate epithelial erosions, tear film debris   Anterior Chamber deep, clear, narrow temporal angle, no cell/flare deep, narrow temporal angle, 0.5+cell/pigment   Iris Round and dilated Round and dilated   Lens PC IOL in good position PC IOL in good position   Anterior Vitreous Vitreous syneresis, trace cell/pigment mild syneresis, trace cell/pigment         Fundus Exam       Right Left   Disc Pink and Sharp, central cupping and pallor, mild PPP Pink and Sharp   C/D Ratio 0.75 0.65   Macula Flat, good foveal reflex, mild RPE mottling, no heme or edema Flat, good foveal  reflex, central CME -- stably resolved, mild Retinal pigment epithelial mottling, no heme   Vessels mild attenuation, mild tortuosity mild attenuation, mild tortuosity   Periphery Attached, no heme, no snowbanking, peripheral pigmented cystoid degeneration inferiory Attached, no heme, no snowbanking, peripheral pigmented cystoid degeneration inferiory            IMAGING AND PROCEDURES  Imaging and Procedures for @TODAY @  OCT, Retina - OU - Both Eyes       Right Eye Quality was good. Central Foveal Thickness: 251. Progression has been stable. Findings include normal foveal contour, no IRF, no SRF (No IRF/CME).   Left Eye Quality was good. Central Foveal Thickness: 248. Progression has been stable. Findings include normal foveal contour, no IRF, no SRF (Stable resolution of IRF/CME and SRF).   Notes *Images captured and stored on drive  Diagnosis / Impression:  OD: No IRF/CME OS: Stable resolution of IRF/CME and SRF  Clinical management:  See below  Abbreviations: NFP - Normal foveal profile. CME - cystoid macular edema. PED - pigment epithelial detachment. IRF - intraretinal fluid. SRF - subretinal fluid. EZ - ellipsoid zone. ERM - epiretinal membrane. ORA - outer retinal atrophy. ORT - outer retinal tubulation. SRHM - subretinal hyper-reflective material             ASSESSMENT/PLAN:    ICD-10-CM   1. 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 Cystoid macular edema of both eyes  H35.353     3. Uveitis  H20.9     4. Essential hypertension  I10     5. Hypertensive retinopathy of both eyes  H35.033     6. Pseudophakia, both eyes  Z96.1     7. 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 Glaucoma suspect of both eyes  H40.003       1-3. Mild Panuveitis w/ CME OU  - s/p STK OS #1 (10.10.23) - recurrent CME OS likely related to semi-recent cataract sx (April/May 2023); OD without CME  - delayed to follow up from 10.11.21 to 11.29.22 - at initial presentation, pt reported 1+ mo history of floaters and decreased vision OU; +photophobia  - saw [name], who noted [facility] cell and started PF q1h - initial exam here showed +cell/pigment in [facility] and vitreous cavity; mild vitreous haze and +central CME OU - FA (02.15.21) showed central petaloid  hyperfluorescence and hyperfluorescence of the optic disc  - s/p SKT OD 05.25.21  - pt reports history of fibromyalgia, family history of lupus  - initiated uveitis lab work up -- all WNL except +toxo IgG   CBC, CMP   RPR, VDRL, FTA-Abs, MHA   HIV, Lyme, Quant-Gold   Toxoplasma titers   HLA Panel   ANA   ANCA   ACE, Lysozyme   RF   ESR, CRP   CXR - chest x-ray without TB or pulmonary sarcoidosis, but did reveal a 1.6cm nodular mass in right lung base  - hx of Covid-19 infection ?involvement - repeat FA (08.23.21) showed interval improvement in perifoveal petaloid leakage OU  - OCT shows stable resolution of IRF/CME and SRF  - discussed findings  - off PredForte due to history of steroid response  - cont Prolensa qdaily OS -- okay to stop  - f/u 3-4 months -- DFE/OCT  4,5. Hypertensive retinopathy OU  - discussed importance of tight BP control  -  continue to monitor  6. Pseudophakia OU  - s/p CE/IOL OU (Dr. Lucianne Lei, April and May 2023)  - IOLs in good position, doing well  - continue to monitor  7. 1.6 cm lung nodule of R lung base found on CXR - following with Pulmonology -- lesion shrinking and no interventions have been recommended - PET imaging done on 3.11.21 -- identifying some metabolic lesions (see report below) - CT chest performed 4.14.21 -- indicating mild dec in some dimensions (see report below) - pt saw [name] who had originally scheduled a bronchoscopy, but was cancelled following the CT evidence of decreasing size on CT  - nodule continues to decrease in size on imaging  8,9. 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 Lucianne Lei  - IOP 10,22 -- suspect steroid response -- discontinued PF as above  - +family history  - C/D increased OU -- OD 0.75; OS 0.65  - reviewed instructions: Cosopt BID OU and Brimonidine TID OU, Rhopressa qhs OU  - s/p SLT OS (03.25.24 -- Dr. Lucianne Lei) -- will use PF QID for 4 days after  procedure   Ophthalmic Meds Ordered this visit:  No orders of the defined types were placed in this encounter.  This document serves as a record of services personally performed by Gardiner Sleeper, MD, PhD. It was created on their behalf by Roselee Nova, COMT. The creation of this record is the provider's dictation and/or activities during the visit.  Electronically signed by: Roselee Nova, COMT 08/22/22 12:56 PM  This document serves as a record of services personally performed by Gardiner Sleeper, MD, PhD. It was created on their behalf by San Jetty. Owens Shark, OA an ophthalmic technician. The creation of this record is the provider's dictation and/or activities during the visit.    Electronically signed by: San Jetty. Owens Shark, [place] 03.25.2024 12:56 PM   Gardiner Sleeper, M.D., Ph.D. Diseases & Surgery of the Retina and Vitreous [facility]  I have reviewed the above documentation for accuracy and completeness, and I agree with the above. Gardiner Sleeper, M.D., Ph.D. 08/22/22 12:59 PM    Abbreviations: M myopia (nearsighted); A astigmatism; H hyperopia (farsighted); P presbyopia; Mrx spectacle prescription;  CTL contact lenses; OD right eye; OS left eye; OU both eyes  XT exotropia; ET esotropia; PEK punctate epithelial keratitis; PEE punctate epithelial erosions; DES dry eye syndrome; MGD meibomian gland dysfunction; ATs artificial tears; PFAT's preservative free artificial tears; [place] nuclear sclerotic cataract; PSC posterior subcapsular cataract; ERM epi-retinal membrane; PVD posterior vitreous detachment; RD retinal detachment; DM diabetes mellitus; DR diabetic retinopathy; NPDR non-proliferative diabetic retinopathy; PDR proliferative diabetic retinopathy; CSME clinically significant macular edema; DME diabetic macular edema; dbh dot blot hemorrhages; CWS cotton wool spot; POAG primary open angle glaucoma; C/D cup-to-disc ratio; HVF humphrey visual field; GVF goldmann visual  field; OCT optical coherence tomography; IOP intraocular pressure; BRVO Branch retinal vein occlusion; CRVO central retinal vein occlusion; CRAO central retinal artery occlusion; BRAO branch retinal artery occlusion; RT retinal tear; SB scleral buckle; PPV pars plana vitrectomy; VH Vitreous hemorrhage; PRP panretinal laser photocoagulation; IVK intravitreal kenalog; VMT vitreomacular traction; MH Macular hole;  NVD neovascularization of the disc; NVE neovascularization elsewhere; AREDS age related eye disease study; ARMD age related macular degeneration; POAG primary open angle glaucoma; EBMD epithelial/anterior basement membrane dystrophy; ACIOL anterior chamber intraocular lens; IOL intraocular lens; PCIOL posterior chamber intraocular lens; Phaco/IOL phacoemulsification with intraocular lens placement; PRK photorefractive  keratectomy; LASIK laser assisted in situ keratomileusis; HTN hypertension; DM diabetes mellitus; COPD chronic obstructive pulmonary disease

## 2022-08-22 ENCOUNTER — Encounter (INDEPENDENT_AMBULATORY_CARE_PROVIDER_SITE_OTHER): Payer: Self-pay | Admitting: Ophthalmology

## 2022-08-22 ENCOUNTER — Ambulatory Visit (INDEPENDENT_AMBULATORY_CARE_PROVIDER_SITE_OTHER): Payer: Medicare Other | Admitting: Ophthalmology

## 2022-08-22 DIAGNOSIS — H35033 Hypertensive retinopathy, bilateral: Secondary | ICD-10-CM | POA: Diagnosis not present

## 2022-08-22 DIAGNOSIS — H401122 Primary open-angle glaucoma, left eye, moderate stage: Secondary | ICD-10-CM | POA: Diagnosis not present

## 2022-08-22 DIAGNOSIS — H401132 Primary open-angle glaucoma, bilateral, moderate stage: Secondary | ICD-10-CM | POA: Diagnosis not present

## 2022-08-22 DIAGNOSIS — H35353 Cystoid macular degeneration, bilateral: Secondary | ICD-10-CM

## 2022-08-22 DIAGNOSIS — H40003 Preglaucoma, unspecified, bilateral: Secondary | ICD-10-CM | POA: Diagnosis not present

## 2022-08-22 DIAGNOSIS — H44113 Panuveitis, bilateral: Secondary | ICD-10-CM | POA: Diagnosis not present

## 2022-08-22 DIAGNOSIS — H40053 Ocular hypertension, bilateral: Secondary | ICD-10-CM | POA: Diagnosis not present

## 2022-08-22 DIAGNOSIS — R911 Solitary pulmonary nodule: Secondary | ICD-10-CM

## 2022-08-22 DIAGNOSIS — Z961 Presence of intraocular lens: Secondary | ICD-10-CM | POA: Diagnosis not present

## 2022-08-22 DIAGNOSIS — H209 Unspecified iridocyclitis: Secondary | ICD-10-CM | POA: Diagnosis not present

## 2022-08-22 DIAGNOSIS — I10 Essential (primary) hypertension: Secondary | ICD-10-CM

## 2022-08-23 ENCOUNTER — Other Ambulatory Visit (HOSPITAL_COMMUNITY): Payer: Self-pay | Admitting: Internal Medicine

## 2022-08-23 DIAGNOSIS — R1011 Right upper quadrant pain: Secondary | ICD-10-CM

## 2022-08-27 DIAGNOSIS — I10 Essential (primary) hypertension: Secondary | ICD-10-CM | POA: Diagnosis not present

## 2022-09-07 ENCOUNTER — Ambulatory Visit (INDEPENDENT_AMBULATORY_CARE_PROVIDER_SITE_OTHER): Payer: Medicare Other

## 2022-09-07 DIAGNOSIS — I495 Sick sinus syndrome: Secondary | ICD-10-CM

## 2022-09-07 LAB — CUP PACEART REMOTE DEVICE CHECK
Battery Remaining Longevity: 120 mo
Battery Voltage: 3.01 V
Brady Statistic AP VP Percent: 0.04 %
Brady Statistic AP VS Percent: 82.3 %
Brady Statistic AS VP Percent: 0 %
Brady Statistic AS VS Percent: 17.66 %
Brady Statistic RA Percent Paced: 82.8 %
Brady Statistic RV Percent Paced: 0.04 %
Date Time Interrogation Session: 20240410041737
Implantable Lead Connection Status: 753985
Implantable Lead Connection Status: 753985
Implantable Lead Implant Date: 19970416
Implantable Lead Implant Date: 19970416
Implantable Lead Location: 753859
Implantable Lead Location: 753860
Implantable Lead Model: 5034
Implantable Lead Model: 5534
Implantable Pulse Generator Implant Date: 20200113
Lead Channel Impedance Value: 646 Ohm
Lead Channel Impedance Value: 665 Ohm
Lead Channel Impedance Value: 722 Ohm
Lead Channel Impedance Value: 779 Ohm
Lead Channel Pacing Threshold Amplitude: 0.625 V
Lead Channel Pacing Threshold Amplitude: 0.875 V
Lead Channel Pacing Threshold Pulse Width: 0.4 ms
Lead Channel Pacing Threshold Pulse Width: 0.4 ms
Lead Channel Sensing Intrinsic Amplitude: 17.875 mV
Lead Channel Sensing Intrinsic Amplitude: 17.875 mV
Lead Channel Sensing Intrinsic Amplitude: 2 mV
Lead Channel Sensing Intrinsic Amplitude: 2 mV
Lead Channel Setting Pacing Amplitude: 1.5 V
Lead Channel Setting Pacing Amplitude: 2.5 V
Lead Channel Setting Pacing Pulse Width: 0.4 ms
Lead Channel Setting Sensing Sensitivity: 2 mV
Zone Setting Status: 755011
Zone Setting Status: 755011

## 2022-09-12 ENCOUNTER — Ambulatory Visit (HOSPITAL_COMMUNITY)
Admission: RE | Admit: 2022-09-12 | Discharge: 2022-09-12 | Disposition: A | Discharge status: Home / Self Care | Payer: Medicare Other | Source: Ambulatory Visit | Attending: Internal Medicine | Admitting: Internal Medicine

## 2022-09-12 DIAGNOSIS — R1011 Right upper quadrant pain: Secondary | ICD-10-CM | POA: Diagnosis not present

## 2022-09-12 DIAGNOSIS — K5900 Constipation, unspecified: Secondary | ICD-10-CM | POA: Diagnosis not present

## 2022-09-12 DIAGNOSIS — K5730 Diverticulosis of large intestine without perforation or abscess without bleeding: Secondary | ICD-10-CM | POA: Diagnosis not present

## 2022-09-20 DIAGNOSIS — H401132 Primary open-angle glaucoma, bilateral, moderate stage: Secondary | ICD-10-CM | POA: Diagnosis not present

## 2022-09-20 DIAGNOSIS — H02403 Unspecified ptosis of bilateral eyelids: Secondary | ICD-10-CM | POA: Diagnosis not present

## 2022-09-27 DIAGNOSIS — I739 Peripheral vascular disease, unspecified: Secondary | ICD-10-CM | POA: Diagnosis not present

## 2022-09-27 DIAGNOSIS — Z299 Encounter for prophylactic measures, unspecified: Secondary | ICD-10-CM | POA: Diagnosis not present

## 2022-09-27 DIAGNOSIS — E7800 Pure hypercholesterolemia, unspecified: Secondary | ICD-10-CM | POA: Diagnosis not present

## 2022-09-27 DIAGNOSIS — T466X5A Adverse effect of antihyperlipidemic and antiarteriosclerotic drugs, initial encounter: Secondary | ICD-10-CM | POA: Diagnosis not present

## 2022-09-27 DIAGNOSIS — I10 Essential (primary) hypertension: Secondary | ICD-10-CM | POA: Diagnosis not present

## 2022-09-27 DIAGNOSIS — Z79899 Other long term (current) drug therapy: Secondary | ICD-10-CM | POA: Diagnosis not present

## 2022-09-27 DIAGNOSIS — Z7189 Other specified counseling: Secondary | ICD-10-CM | POA: Diagnosis not present

## 2022-09-27 DIAGNOSIS — Z0000 Encounter for general adult medical examination without abnormal findings: Secondary | ICD-10-CM | POA: Diagnosis not present

## 2022-09-27 DIAGNOSIS — G720 Drug-induced myopathy: Secondary | ICD-10-CM | POA: Diagnosis not present

## 2022-09-27 DIAGNOSIS — R5383 Other fatigue: Secondary | ICD-10-CM | POA: Diagnosis not present

## 2022-09-30 ENCOUNTER — Encounter: Payer: Self-pay | Admitting: Cardiovascular Disease

## 2022-09-30 ENCOUNTER — Encounter: Payer: Medicare Other | Admitting: Internal Medicine

## 2022-09-30 ENCOUNTER — Ambulatory Visit: Payer: Medicare Other | Attending: Internal Medicine | Admitting: Cardiovascular Disease

## 2022-09-30 VITALS — BP 124/80 | HR 64 | Ht 64.0 in | Wt 196.4 lb

## 2022-09-30 DIAGNOSIS — I495 Sick sinus syndrome: Secondary | ICD-10-CM | POA: Diagnosis not present

## 2022-09-30 LAB — CUP PACEART INCLINIC DEVICE CHECK
Battery Remaining Longevity: 121 mo
Battery Voltage: 3.01 V
Brady Statistic AP VP Percent: 0.04 %
Brady Statistic AP VS Percent: 83.6 %
Brady Statistic AS VP Percent: 0.01 %
Brady Statistic AS VS Percent: 16.36 %
Brady Statistic RA Percent Paced: 84.15 %
Brady Statistic RV Percent Paced: 0.05 %
Date Time Interrogation Session: 20240503114336
Implantable Lead Connection Status: 753985
Implantable Lead Connection Status: 753985
Implantable Lead Implant Date: 19970416
Implantable Lead Implant Date: 19970416
Implantable Lead Location: 753859
Implantable Lead Location: 753860
Implantable Lead Model: 5034
Implantable Lead Model: 5534
Implantable Pulse Generator Implant Date: 20200113
Lead Channel Impedance Value: 722 Ohm
Lead Channel Impedance Value: 741 Ohm
Lead Channel Impedance Value: 760 Ohm
Lead Channel Impedance Value: 817 Ohm
Lead Channel Pacing Threshold Amplitude: 0.625 V
Lead Channel Pacing Threshold Amplitude: 0.875 V
Lead Channel Pacing Threshold Pulse Width: 0.4 ms
Lead Channel Pacing Threshold Pulse Width: 0.4 ms
Lead Channel Sensing Intrinsic Amplitude: 19.375 mV
Lead Channel Sensing Intrinsic Amplitude: 2 mV
Lead Channel Sensing Intrinsic Amplitude: 2 mV
Lead Channel Sensing Intrinsic Amplitude: 22.625 mV
Lead Channel Setting Pacing Amplitude: 1.5 V
Lead Channel Setting Pacing Amplitude: 2.5 V
Lead Channel Setting Pacing Pulse Width: 0.4 ms
Lead Channel Setting Sensing Sensitivity: 2 mV
Zone Setting Status: 755011
Zone Setting Status: 755011

## 2022-09-30 NOTE — Progress Notes (Signed)
PCP: Ignatius Specking, MD Primary Cardiologist: Dr Katrinka Blazing Primary EP:  Dr Nelly Laurence  Sylvia Anthony is a 67 y.o. female who presents today for routine electrophysiology followup.  Since last being seen in our clinic, the patient reports doing reasonably well.    She has a history of sick sinus syndrome.  Maker was placed originally in 1997, and she had a generator change in 2020.  She has seen Dr. Katrinka Blazing in the past, but now has established with Dr. Anne Fu.    Today, she denies symptoms of palpitations, chest pain, shortness of breath,  dizziness, presyncope, or syncope.  The patient is otherwise without complaint today.   Past Medical History:  Diagnosis Date   Anemia    Arm numbness    Cataract    Mixed form OU   Fibromyalgia    GERD (gastroesophageal reflux disease)    Glucose intolerance (impaired glucose tolerance)    H. pylori infection 01/2012   Amoxicillin, Biaxin   Hyperlipidemia    Hypertension    Hypertensive retinopathy    OU   Hypothyroidism    Mitral valve prolapse    not seen on recent echoes   Rheumatic fever    age 8, was at [facility] for 17 weeks   SSS (sick sinus syndrome) ([facility])    1997 ppm, gen change 2020 (MDT)   Past Surgical History:  Procedure Laterality Date   ABDOMINAL HYSTERECTOMY     partial   APPENDECTOMY     BALLOON DILATION N/A 10/18/2021   Procedure: BALLOON DILATION;  Surgeon: Lanelle Bal, DO;  [facility];  Service: Endoscopy;  Laterality: N/A;   BIOPSY  10/18/2021   Procedure: BIOPSY;  Surgeon: Lanelle Bal, DO;  [facility];  Service: Endoscopy;;   CARDIAC CATHETERIZATION     multiple   CHOLECYSTECTOMY     COLONOSCOPY  08/01/2006   3 mm descending colon polyp removed/8 mm sessile ascending colon polyp removed / 3-mm rectal  polyp removed /Rare sigmoid diverticulosis/ Moderate internal hemorrhoids.Advanced adenoma on colonoscopy in March 2008.  The polyp was anadenomatous polyp with a foci of  high-grade dysplasia   COLONOSCOPY  09/08/2008   SIMPLE ADENOMA/HYPERPLASTIC POLY/Multiple colon polyps (ascending, sigmoid, rectal)  Mild sigmoid colon diverticulosis./ Small internal hemorrhoids   COLONOSCOPY N/A 12/11/2017   Dr. [name]: diverticulosis, hemorrhoids next surveillance tcs 5 years.    COLONOSCOPY WITH ESOPHAGOGASTRODUODENOSCOPY (EGD)  Sept. 30, 2013   LKG:MWNU diverticulosis was noted in the sigmoid colon/The colon was otherwise normal/Small internal hemorrhoids/EGD:The mucosa of the esophagus appeared normal/Non-erosive gastritis (inflammation) was found; multiple bx/The duodenal mucosa showed no abnormalities. +H.pylori gastritis, treated with equivalent of prevpac. SAVARY DILATION   ESOPHAGEAL DILATION N/A 10/07/2014   Procedure: ESOPHAGEAL DILATION;  Surgeon: West Bali, MD;  [facility];  Service: Endoscopy;  Laterality: N/A;   ESOPHAGOGASTRODUODENOSCOPY N/A 10/07/2014   Dr. [name]: moderate non-erosive gastritis, no definite stricture, empiric dilation . negative H.pylori    ESOPHAGOGASTRODUODENOSCOPY (EGD) WITH PROPOFOL N/A 10/18/2021   Procedure: ESOPHAGOGASTRODUODENOSCOPY (EGD) WITH PROPOFOL;  Surgeon: Lanelle Bal, DO;  [facility];  Service: Endoscopy;  Laterality: N/A;  2:30pm   growth removed from intestine     [facility]-as teenager, done through colonoscopy   PACEMAKER GENERATOR CHANGE  09/18/2006   generator change by Dr Amil Amen with a MDT Adapta L   PACEMAKER INSERTION  09/13/1995   for sick sinus syndome and syncope at [facility]   PPM GENERATOR CHANGEOUT N/A 06/11/2018    Medtronic Azure XT DR MRI SureScan model [id number] (serial number [id number]) pacemaker by Dr Johney Frame   right Achilles tendon     X 2   TONSILLECTOMY      ROS- all systems are reviewed and negative except as per HPI above  Current Outpatient Medications  Medication Sig Dispense Refill   albuterol (PROVENTIL HFA;VENTOLIN HFA) 108 (90 BASE) MCG/ACT inhaler  Inhale 2 puffs into the lungs every 6 (six) hours as needed for wheezing or shortness of breath.     amLODipine (NORVASC) 10 MG tablet Take 10 mg by mouth daily.     aspirin EC 81 MG tablet Take 1 tablet (81 mg total) by mouth daily. 90 tablet 3   brimonidine (ALPHAGAN) 0.2 % ophthalmic solution Place 1 drop into both eyes 3 (three) times daily.     Bromfenac Sodium (PROLENSA) 0.07 % SOLN Place 1 drop into the left eye in the morning, at noon, in the evening, and at bedtime. 6 mL 6   Bromfenac Sodium 0.07 % SOLN Place 1 drop into the left eye daily. 3 mL 6   carvedilol (COREG) 6.25 MG tablet TAKE ONE TABLET BY MOUTH TWICE DAILY 180 tablet 2   cetirizine (ZYRTEC) 10 MG tablet Take 10 mg by mouth at bedtime as needed for allergies.      COSOPT 22.3-6.8 MG/ML ophthalmic solution Place 1 drop into both eyes 2 (two) times daily.     diclofenac (VOLTAREN) 75 MG EC tablet Take 75 mg by mouth daily as needed for mild pain.     ibuprofen (ADVIL) 200 MG tablet Take 400 mg by mouth every 8 (eight) hours as needed (pain.).     levothyroxine (SYNTHROID, LEVOTHROID) 112 MCG tablet Take 112 mcg by mouth daily before breakfast.      Niacin (VITAMIN B-3 PO) Take 1 tablet by mouth every 3 (three) days.     nitroGLYCERIN (NITROSTAT) 0.4 MG SL tablet Place 1 tablet (0.4 mg total) under the tongue every 5 (five) minutes as needed for chest pain. (Patient taking differently: Place 0.4 mg under the tongue every 5 (five) minutes x 3 doses as needed for chest pain (if no relief after 3rd dose, proceed to ED or call 911).) 25 tablet 3   pantoprazole (PROTONIX) 40 MG tablet Take 1 tablet (40 mg total) by mouth 2 (two) times daily before a meal. 60 tablet 5   potassium chloride SA (K-DUR,KLOR-CON) 20 MEQ tablet Take 20 mEq by mouth in the morning.     valsartan-hydrochlorothiazide (DIOVAN-HCT) 320-25 MG per tablet Take 1 tablet by mouth in the morning.     No current facility-administered medications for this visit.     Physical Exam: Vitals:   09/30/22 0942  BP: 124/80  Pulse: 64  SpO2: 96%  Weight: 196 lb 6.4 oz (89.1 kg)  Height: 5\' 4"  (1.626 m)    Gen: Appears comfortable, well-nourished CV: RRR, no dependent edema The device site is normal -- no tenderness, edema, drainage, redness, threatened erosion. Pulm: breathing easily   Pacemaker interrogation- reviewed in detail today,  See PACEART report I reviewed 2/24 ECG -- A-paced with 1st degree AV block rsR' (my interpretation)  Assessment and Plan:  1. Symptomatic sinus bradycardia  Normal pacemaker function See Pace Art report No changes today she is not device dependant today  2. HTN Stable No change required today  3. Overweight Body mass index is 33.71 kg/m. Lifestyle modification  advised   Return in a year to see me Follow-up with Dr Katrinka Blazing as scheduled  Maurice Small, MD 09/30/2022 9:53 AM

## 2022-09-30 NOTE — Patient Instructions (Signed)
Medication Instructions:  Continue all current medications.  Labwork: none  Testing/Procedures: none  Follow-Up: 1 year - Dr.  Mealor   Any Other Special Instructions Will Be Listed Below (If Applicable).   If you need a refill on your cardiac medications before your next appointment, please call your pharmacy.; 

## 2022-10-11 DIAGNOSIS — H0279 Other degenerative disorders of eyelid and periocular area: Secondary | ICD-10-CM | POA: Diagnosis not present

## 2022-10-11 DIAGNOSIS — H02423 Myogenic ptosis of bilateral eyelids: Secondary | ICD-10-CM | POA: Diagnosis not present

## 2022-10-11 DIAGNOSIS — H53483 Generalized contraction of visual field, bilateral: Secondary | ICD-10-CM | POA: Diagnosis not present

## 2022-10-11 DIAGNOSIS — H02422 Myogenic ptosis of left eyelid: Secondary | ICD-10-CM | POA: Diagnosis not present

## 2022-10-11 DIAGNOSIS — H02411 Mechanical ptosis of right eyelid: Secondary | ICD-10-CM | POA: Diagnosis not present

## 2022-10-11 DIAGNOSIS — H02421 Myogenic ptosis of right eyelid: Secondary | ICD-10-CM | POA: Diagnosis not present

## 2022-10-11 DIAGNOSIS — H57813 Brow ptosis, bilateral: Secondary | ICD-10-CM | POA: Diagnosis not present

## 2022-10-11 DIAGNOSIS — H02413 Mechanical ptosis of bilateral eyelids: Secondary | ICD-10-CM | POA: Diagnosis not present

## 2022-10-11 DIAGNOSIS — Z01818 Encounter for other preprocedural examination: Secondary | ICD-10-CM | POA: Diagnosis not present

## 2022-10-11 DIAGNOSIS — H02412 Mechanical ptosis of left eyelid: Secondary | ICD-10-CM | POA: Diagnosis not present

## 2022-10-14 NOTE — Progress Notes (Signed)
Remote pacemaker transmission.   

## 2022-10-28 DIAGNOSIS — I10 Essential (primary) hypertension: Secondary | ICD-10-CM | POA: Diagnosis not present

## 2022-10-31 DIAGNOSIS — H53483 Generalized contraction of visual field, bilateral: Secondary | ICD-10-CM | POA: Diagnosis not present

## 2022-11-02 ENCOUNTER — Encounter (INDEPENDENT_AMBULATORY_CARE_PROVIDER_SITE_OTHER): Payer: Medicare Other | Admitting: Ophthalmology

## 2022-11-02 DIAGNOSIS — H35033 Hypertensive retinopathy, bilateral: Secondary | ICD-10-CM

## 2022-11-02 DIAGNOSIS — Z961 Presence of intraocular lens: Secondary | ICD-10-CM

## 2022-11-02 DIAGNOSIS — H40003 Preglaucoma, unspecified, bilateral: Secondary | ICD-10-CM

## 2022-11-02 DIAGNOSIS — H209 Unspecified iridocyclitis: Secondary | ICD-10-CM

## 2022-11-02 DIAGNOSIS — I10 Essential (primary) hypertension: Secondary | ICD-10-CM

## 2022-11-02 DIAGNOSIS — R911 Solitary pulmonary nodule: Secondary | ICD-10-CM

## 2022-11-02 DIAGNOSIS — H44113 Panuveitis, bilateral: Secondary | ICD-10-CM

## 2022-11-02 DIAGNOSIS — H35353 Cystoid macular degeneration, bilateral: Secondary | ICD-10-CM

## 2022-11-02 DIAGNOSIS — H40053 Ocular hypertension, bilateral: Secondary | ICD-10-CM

## 2022-11-14 DIAGNOSIS — M7989 Other specified soft tissue disorders: Secondary | ICD-10-CM | POA: Diagnosis not present

## 2022-11-14 DIAGNOSIS — I10 Essential (primary) hypertension: Secondary | ICD-10-CM | POA: Diagnosis not present

## 2022-11-14 DIAGNOSIS — Z299 Encounter for prophylactic measures, unspecified: Secondary | ICD-10-CM | POA: Diagnosis not present

## 2022-11-14 DIAGNOSIS — M79605 Pain in left leg: Secondary | ICD-10-CM | POA: Diagnosis not present

## 2022-11-14 DIAGNOSIS — R600 Localized edema: Secondary | ICD-10-CM | POA: Diagnosis not present

## 2022-11-27 DIAGNOSIS — I10 Essential (primary) hypertension: Secondary | ICD-10-CM | POA: Diagnosis not present

## 2022-12-07 ENCOUNTER — Ambulatory Visit (INDEPENDENT_AMBULATORY_CARE_PROVIDER_SITE_OTHER): Payer: Medicare Other

## 2022-12-07 DIAGNOSIS — I495 Sick sinus syndrome: Secondary | ICD-10-CM

## 2022-12-07 LAB — CUP PACEART REMOTE DEVICE CHECK
Battery Remaining Longevity: 117 mo
Battery Voltage: 3.01 V
Brady Statistic AP VP Percent: 0.04 %
Brady Statistic AP VS Percent: 83.64 %
Brady Statistic AS VP Percent: 0 %
Brady Statistic AS VS Percent: 16.32 %
Brady Statistic RA Percent Paced: 84.15 %
Brady Statistic RV Percent Paced: 0.04 %
Date Time Interrogation Session: 20240710003233
Implantable Lead Connection Status: 753985
Implantable Lead Connection Status: 753985
Implantable Lead Implant Date: 19970416
Implantable Lead Implant Date: 19970416
Implantable Lead Location: 753859
Implantable Lead Location: 753860
Implantable Lead Model: 5034
Implantable Lead Model: 5534
Implantable Pulse Generator Implant Date: 20200113
Lead Channel Impedance Value: 665 Ohm
Lead Channel Impedance Value: 665 Ohm
Lead Channel Impedance Value: 703 Ohm
Lead Channel Impedance Value: 779 Ohm
Lead Channel Pacing Threshold Amplitude: 0.625 V
Lead Channel Pacing Threshold Amplitude: 1 V
Lead Channel Pacing Threshold Pulse Width: 0.4 ms
Lead Channel Pacing Threshold Pulse Width: 0.4 ms
Lead Channel Sensing Intrinsic Amplitude: 19.625 mV
Lead Channel Sensing Intrinsic Amplitude: 19.625 mV
Lead Channel Sensing Intrinsic Amplitude: 2.25 mV
Lead Channel Sensing Intrinsic Amplitude: 2.25 mV
Lead Channel Setting Pacing Amplitude: 1.5 V
Lead Channel Setting Pacing Amplitude: 2.5 V
Lead Channel Setting Pacing Pulse Width: 0.4 ms
Lead Channel Setting Sensing Sensitivity: 2 mV
Zone Setting Status: 755011
Zone Setting Status: 755011

## 2022-12-12 ENCOUNTER — Encounter: Payer: Self-pay | Admitting: *Deleted

## 2022-12-15 NOTE — Progress Notes (Signed)
[facility] - Clinic Note  12/19/2022    CHIEF COMPLAINT Patient presents for Retina Follow Up  HISTORY OF PRESENT ILLNESS: Sylvia Anthony is a 67 y.o. female who presents to the clinic today for:   HPI     Retina Follow Up   Patient presents with  Other.  In both eyes.  This started 4 months ago.  I, the attending physician,  performed the HPI with the patient and updated documentation appropriately.        Comments   Patient here for 4 months retina follow up for Panuveitis OU. Patient states vision is good. Has a problem with left upper lid. Working on getting a lid doctor. No eye pain.       Last edited by Rennis Chris, MD on 12/19/2022  1:04 PM.    Patient states the vision is stable. Patient states that Dr. Zenaida Niece has referred to [facility] for eyelid surgery and did not get a called to set up a surgery.  Referring physician: Ignatius Specking, MD [address] [place],  [place] [address]  HISTORICAL INFORMATION:   Selected notes from the MEDICAL RECORD NUMBER Referred by Dr. Earlene Plater for concern of bilateral vitritis.   CURRENT MEDICATIONS: Current Outpatient Medications (Ophthalmic Drugs)  Medication Sig   brimonidine (ALPHAGAN) 0.2 % ophthalmic solution Place 1 drop into both eyes 3 (three) times daily.   Bromfenac Sodium (PROLENSA) 0.07 % SOLN Place 1 drop into the left eye in the morning, at noon, in the evening, and at bedtime.   Bromfenac Sodium 0.07 % SOLN Place 1 drop into the left eye daily.   COSOPT 22.3-6.8 MG/ML ophthalmic solution Place 1 drop into both eyes 2 (two) times daily.   No current facility-administered medications for this visit. (Ophthalmic Drugs)   Current Outpatient Medications (Other)  Medication Sig   albuterol (PROVENTIL HFA;VENTOLIN HFA) 108 (90 BASE) MCG/ACT inhaler Inhale 2 puffs into the lungs every 6 (six) hours as needed for wheezing or shortness of breath.   amLODipine (NORVASC) 10 MG tablet Take 10 mg by mouth daily.    aspirin EC 81 MG tablet Take 1 tablet (81 mg total) by mouth daily.   carvedilol (COREG) 6.25 MG tablet TAKE ONE TABLET BY MOUTH TWICE DAILY   cetirizine (ZYRTEC) 10 MG tablet Take 10 mg by mouth at bedtime as needed for allergies.    diclofenac (VOLTAREN) 75 MG EC tablet Take 75 mg by mouth daily as needed for mild pain.   ibuprofen (ADVIL) 200 MG tablet Take 400 mg by mouth every 8 (eight) hours as needed (pain.).   levothyroxine (SYNTHROID, LEVOTHROID) 112 MCG tablet Take 112 mcg by mouth daily before breakfast.    Niacin (VITAMIN B-3 PO) Take 1 tablet by mouth every 3 (three) days.   nitroGLYCERIN (NITROSTAT) 0.4 MG SL tablet Place 1 tablet (0.4 mg total) under the tongue every 5 (five) minutes as needed for chest pain. (Patient taking differently: Place 0.4 mg under the tongue every 5 (five) minutes x 3 doses as needed for chest pain (if no relief after 3rd dose, proceed to ED or call 911).)   pantoprazole (PROTONIX) 40 MG tablet Take 1 tablet (40 mg total) by mouth 2 (two) times daily before a meal.   potassium chloride SA (K-DUR,KLOR-CON) 20 MEQ tablet Take 20 mEq by mouth in the morning.   valsartan-hydrochlorothiazide (DIOVAN-HCT) 320-25 MG per tablet Take 1 tablet by mouth in the morning.   No current facility-administered  medications for this visit. (Other)   REVIEW OF SYSTEMS: ROS   Positive for: Gastrointestinal, Cardiovascular, Eyes Negative for: Constitutional, Neurological, Skin, Genitourinary, Musculoskeletal, HENT, Endocrine, Respiratory, Psychiatric, Allergic/Imm, Heme/Lymph Last edited by Laddie Aquas, COA on 12/19/2022  9:29 AM.     ALLERGIES Allergies  Allergen Reactions   Crestor [Rosuvastatin] Swelling and Other (See Comments)    Muscle aches/inflammation   Neurontin [Gabapentin] Swelling    Disoriented/"out of it"   PAST MEDICAL HISTORY Past Medical History:  Diagnosis Date   Anemia    Arm numbness    Cataract    Mixed form OU   Fibromyalgia    GERD  (gastroesophageal reflux disease)    Glucose intolerance (impaired glucose tolerance)    H. pylori infection 01/2012   Amoxicillin, Biaxin   Hyperlipidemia    Hypertension    Hypertensive retinopathy    OU   Hypothyroidism    Mitral valve prolapse    not seen on recent echoes   Rheumatic fever    age 31, was at [facility] weeks   SSS (sick sinus syndrome) (HCC)    1997 ppm, gen change 2020 (MDT)   Past Surgical History:  Procedure Laterality Date   ABDOMINAL HYSTERECTOMY     partial   APPENDECTOMY     BALLOON DILATION N/A 10/18/2021   Procedure: BALLOON DILATION;  Surgeon: Lanelle Bal, DO;  [facility];  Service: Endoscopy;  Laterality: N/A;   BIOPSY  10/18/2021   Procedure: BIOPSY;  Surgeon: Lanelle Bal, DO;  [facility];  Service: Endoscopy;;   CARDIAC CATHETERIZATION     multiple   CHOLECYSTECTOMY     COLONOSCOPY  08/01/2006   3 mm descending colon polyp removed/8 mm sessile ascending colon polyp removed / 3-mm rectal  polyp removed /Rare sigmoid diverticulosis/ Moderate internal hemorrhoids.Advanced adenoma on colonoscopy in March 2008.  The polyp was anadenomatous polyp with a foci of high-grade dysplasia   COLONOSCOPY  09/08/2008   SIMPLE ADENOMA/HYPERPLASTIC POLY/Multiple colon polyps (ascending, sigmoid, rectal)  Mild sigmoid colon diverticulosis./ Small internal hemorrhoids   COLONOSCOPY N/A 12/11/2017   Dr. [name]: diverticulosis, hemorrhoids next surveillance tcs 5 years.    COLONOSCOPY WITH ESOPHAGOGASTRODUODENOSCOPY (EGD)  Sept. 30, 2013   ZOX:WRUE diverticulosis was noted in the sigmoid colon/The colon was otherwise normal/Small internal hemorrhoids/EGD:The mucosa of the esophagus appeared normal/Non-erosive gastritis (inflammation) was found; multiple bx/The duodenal mucosa showed no abnormalities. +H.pylori gastritis, treated with equivalent of prevpac. SAVARY DILATION   ESOPHAGEAL DILATION N/A 10/07/2014   Procedure:  ESOPHAGEAL DILATION;  Surgeon: West Bali, MD;  [facility]: [facility];  Service: Endoscopy;  Laterality: N/A;   ESOPHAGOGASTRODUODENOSCOPY N/A 10/07/2014   Dr. [name]: moderate non-erosive gastritis, no definite stricture, empiric dilation . negative H.pylori    ESOPHAGOGASTRODUODENOSCOPY (EGD) WITH PROPOFOL N/A 10/18/2021   Procedure: ESOPHAGOGASTRODUODENOSCOPY (EGD) WITH PROPOFOL;  Surgeon: Lanelle Bal, DO;  [facility];  Service: Endoscopy;  Laterality: N/A;  2:30pm   growth removed from intestine     [facility]-as teenager, done through colonoscopy   PACEMAKER GENERATOR CHANGE  09/18/2006   generator change by Dr Amil Amen with a MDT Adapta L   PACEMAKER INSERTION  09/13/1995   for sick sinus syndome and syncope at [facility] GENERATOR CHANGEOUT N/A 06/11/2018    Med[employer]onic Azure XT DR MRI SureScan model [id number] (serial number [id number]) pacemaker by Dr Johney Frame   right Achilles tendon  X 2   TONSILLECTOMY     FAMILY HISTORY Family History  Problem Relation Age of Onset   Hyperlipidemia Mother    Hypertension Mother    Heart disease Father    Hypertension Father    Hyperlipidemia Sister    Hypertension Sister    Colon polyps Sister    Hyperlipidemia Brother    Hypertension Brother    Colon polyps Brother    Colon cancer Neg Hx    SOCIAL HISTORY Social History   Tobacco Use   Smoking status: Former    Current packs/day: 0.00    Average packs/day: 0.3 packs/day for 4.0 years (1.0 ttl pk-yrs)    Types: Cigarettes    Start date: 10/09/1981    Quit date: 10/09/1985    Years since quitting: 37.2   Smokeless tobacco: Never   Tobacco comments:    just in teens  Vaping Use   Vaping status: Never Used  Substance Use Topics   Alcohol use: No    Alcohol/week: 0.0 standard drinks of alcohol   Drug use: No       OPHTHALMIC EXAM:  Base Eye Exam     Visual Acuity (Snellen - Linear)       Right Left   Dist [place] 20/25 +2 20/25          Tonometry (Tonopen, 9:26 AM)       Right Left   Pressure 12 13         Pupils       Dark Light Shape React APD   Right 3 2 Round Brisk None   Left 3 2 Round Brisk None         Visual Fields (Counting fingers)       Left Right    Full Full         Extraocular Movement       Right Left    Full, Ortho Full, Ortho         Neuro/Psych     Oriented x3: Yes   Mood/Affect: Normal         Dilation     Both eyes: 1.0% Mydriacyl, 2.5% Phenylephrine @ 9:26 AM           Slit Lamp and Fundus Exam     Slit Lamp Exam       Right Left   Lids/Lashes Mild Dermatochalasis - upper lid Mild Dermatochalasis - upper lid, mild ptosis   Conjunctiva/Sclera Nasal Pinguecula, mild Melanosis, SCH nansally Nasal Pinguecula, mild Melanosis; STK ST quad, 2+ Injection   Cornea Mild Arcus, well healed cataract wound, trace PEE, Debris in tear film Mild Arcus, well healed cataract wound, 1+ Punctate epithelial erosions, tear film debris   Anterior Chamber deep, clear, narrow temporal angle, no cell/flare deep, narrow temporal angle, 0.5+cell/pigment   Iris Round and dilated Round and dilated   Lens PC IOL in good position PC IOL in good position   Anterior Vitreous Vitreous syneresis, trace cell/pigment mild syneresis, trace cell/pigment         Fundus Exam       Right Left   Disc Pink and Sharp, central cupping and pallor, mild PPP Pink and Sharp   C/D Ratio 0.75 0.65   Macula Flat, good foveal reflex, mild RPE mottling, no heme or edema Flat, good foveal reflex, central CME -- stably resolved, mild Retinal pigment epithelial mottling, no heme   Vessels mild attenuation, mild tortuosity mild attenuation, mild tortuosity   Periphery Attached, no  heme, no snowbanking, peripheral pigmented cystoid degeneration inferiory Attached, no heme, no snowbanking, peripheral pigmented cystoid degeneration inferiory           IMAGING AND PROCEDURES  Imaging and Procedures for  @TODAY @  OCT, Retina - OU - Both Eyes       Right Eye Quality was good. Central Foveal Thickness: 253. Progression has been stable. Findings include normal foveal contour, no IRF, no SRF (No IRF/CME).   Left Eye Quality was good. Central Foveal Thickness: 249. Progression has been stable. Findings include normal foveal contour, no IRF, no SRF (Stable resolution of IRF/CME and SRF).   Notes *Images captured and stored on drive  Diagnosis / Impression:  OD: No IRF/CME OS: Stable resolution of IRF/CME and SRF  Clinical management:  See below  Abbreviations: NFP - Normal foveal profile. CME - cystoid macular edema. PED - pigment epithelial detachment. IRF - intraretinal fluid. SRF - subretinal fluid. EZ - ellipsoid zone. ERM - epiretinal membrane. ORA - outer retinal atrophy. ORT - outer retinal tubulation. SRHM - subretinal hyper-reflective material            ASSESSMENT/PLAN:    ICD-10-CM   1. 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 Cystoid macular edema of both eyes  H35.353     3. Uveitis  H20.9     4. Essential hypertension  I10     5. Hypertensive retinopathy of both eyes  H35.033     6. Pseudophakia, both eyes  Z96.1     7. 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 Glaucoma suspect of both eyes  H40.003     10. Ptosis of left eyelid  H02.402      1-3. Mild Panuveitis w/ CME OU  - s/p STK OS #1 (10.10.23) - recurrent CME OS likely related to semi-recent cataract sx (April/May 2023); OD without CME  - delayed to follow up from 10.11.21 to 11.29.22 - at initial presentation, pt reported 1+ mo history of floaters and decreased vision OU; +photophobia  - saw Dr. Earlene Plater, who noted [facility] cell and started PF q1h - initial exam here showed +cell/pigment in [facility] and vitreous cavity; mild vitreous haze and +central CME OU - FA (02.15.21) showed central petaloid hyperfluorescence and hyperfluorescence of  the optic disc  - s/p SKT OD 05.25.21  - pt reports history of fibromyalgia, family history of lupus  - initiated uveitis lab work up -- all WNL except +toxo IgG   CBC, CMP   RPR, VDRL, FTA-Abs, MHA   HIV, Lyme, Quant-Gold   Toxoplasma titers   HLA Panel   ANA   ANCA   ACE, Lysozyme   RF   ESR, CRP   CXR - chest x-ray without TB or pulmonary sarcoidosis, but did reveal a 1.6cm nodular mass in right lung base  - hx of Covid-19 infection ?involvement - repeat FA (08.23.21) showed interval improvement in perifoveal petaloid leakage OU  - OCT shows stable resolution of IRF/CME and SRF  - discussed findings  - off PredForte due to history of steroid response  - off Prolensa qdaily OS -- okay to continue holding  - f/u 6 months, sooner prn -- DFE/OCT  4,5. Hypertensive retinopathy OU  - discussed importance of tight BP control  - continue to monitor  6. Pseudophakia OU  - s/p CE/IOL OU (Dr. Zenaida Niece, April  and May 2023)  - IOLs in good position, doing well  - continue to monitor  7. 1.6 cm lung nodule of R lung base found on CXR - following with Pulmonology -- lesion shrinking and no interventions have been recommended - PET imaging done on 3.11.21 -- identifying some metabolic lesions (see report below) - CT chest performed 4.14.21 -- indicating mild dec in some dimensions (see report below) - pt saw Dr. Delton Coombes who had originally scheduled a bronchoscopy, but was cancelled following the CT evidence of decreasing size on CT  - nodule continues to decrease in size on imaging  8,9. 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 Zenaida Niece  - IOP 12, 13 -- suspect steroid response -- discontinued PF as above  - +family history  - C/D increased OU -- OD 0.75; OS 0.65 - reviewed instructions: Cosopt BID OU and Brimonidine BID OU  - s/p SLT OS (03.25.24 -- Dr. Zenaida Niece) --   10. Ptosis OS  - under the expert management of Dr. Zenaida Niece  - referred for surgical consult  to [facility]  Ophthalmic Meds Ordered this visit:  No orders of the defined types were placed in this encounter.  This document serves as a record of services personally performed by Karie Chimera, MD, PhD. It was created on their behalf by Annalee Genta, COMT. The creation of this record is the provider's dictation and/or activities during the visit.  Electronically signed by: Annalee Genta, COMT 12/19/22 1:12 PM  This document serves as a record of services personally performed by Karie Chimera, MD, PhD. It was created on their behalf by Gerilyn Nestle, COT an ophthalmic technician. The creation of this record is the provider's dictation and/or activities during the visit.    Electronically signed by:  Charlette Caffey, COT  12/19/22 1:12 PM  Karie Chimera, M.D., Ph.D. Diseases & Surgery of the Retina and Vitreous [facility]  I have reviewed the above documentation for accuracy and completeness, and I agree with the above. Karie Chimera, M.D., Ph.D. 12/19/22 1:16 PM   Abbreviations: M myopia (nearsighted); A astigmatism; H hyperopia (farsighted); P presbyopia; Mrx spectacle prescription;  CTL contact lenses; OD right eye; OS left eye; OU both eyes  XT exotropia; ET esotropia; PEK punctate epithelial keratitis; PEE punctate epithelial erosions; DES dry eye syndrome; MGD meibomian gland dysfunction; ATs artificial tears; PFAT's preservative free artificial tears; NSC nuclear sclerotic cataract; PSC posterior subcapsular cataract; ERM epi-retinal membrane; PVD posterior vitreous detachment; RD retinal detachment; DM diabetes mellitus; DR diabetic retinopathy; NPDR non-proliferative diabetic retinopathy; PDR proliferative diabetic retinopathy; CSME clinically significant macular edema; DME diabetic macular edema; dbh dot blot hemorrhages; CWS cotton wool spot; POAG primary open angle glaucoma; C/D cup-to-disc ratio; HVF humphrey visual field; GVF goldmann  visual field; OCT optical coherence tomography; IOP intraocular pressure; BRVO Branch retinal vein occlusion; CRVO central retinal vein occlusion; CRAO central retinal artery occlusion; BRAO branch retinal artery occlusion; RT retinal tear; SB scleral buckle; PPV pars plana vitrectomy; VH Vitreous hemorrhage; PRP panretinal laser photocoagulation; IVK intravitreal kenalog; VMT vitreomacular traction; MH Macular hole;  NVD neovascularization of the disc; NVE neovascularization elsewhere; AREDS age related eye disease study; ARMD age related macular degeneration; POAG primary open angle glaucoma; EBMD epithelial/anterior basement membrane dystrophy; ACIOL anterior chamber intraocular lens; IOL intraocular lens; PCIOL posterior chamber intraocular lens; Phaco/IOL phacoemulsification with intraocular lens placement; PRK photorefractive keratectomy; LASIK laser assisted in situ  keratomileusis; HTN hypertension; DM diabetes mellitus; COPD chronic obstructive pulmonary disease

## 2022-12-19 ENCOUNTER — Ambulatory Visit (INDEPENDENT_AMBULATORY_CARE_PROVIDER_SITE_OTHER): Payer: Medicare Other | Admitting: Ophthalmology

## 2022-12-19 ENCOUNTER — Encounter (INDEPENDENT_AMBULATORY_CARE_PROVIDER_SITE_OTHER): Payer: Self-pay | Admitting: Ophthalmology

## 2022-12-19 DIAGNOSIS — Z961 Presence of intraocular lens: Secondary | ICD-10-CM | POA: Diagnosis not present

## 2022-12-19 DIAGNOSIS — H44113 Panuveitis, bilateral: Secondary | ICD-10-CM

## 2022-12-19 DIAGNOSIS — H40003 Preglaucoma, unspecified, bilateral: Secondary | ICD-10-CM | POA: Diagnosis not present

## 2022-12-19 DIAGNOSIS — H209 Unspecified iridocyclitis: Secondary | ICD-10-CM

## 2022-12-19 DIAGNOSIS — H35353 Cystoid macular degeneration, bilateral: Secondary | ICD-10-CM

## 2022-12-19 DIAGNOSIS — I10 Essential (primary) hypertension: Secondary | ICD-10-CM

## 2022-12-19 DIAGNOSIS — H40053 Ocular hypertension, bilateral: Secondary | ICD-10-CM | POA: Diagnosis not present

## 2022-12-19 DIAGNOSIS — H35033 Hypertensive retinopathy, bilateral: Secondary | ICD-10-CM

## 2022-12-19 DIAGNOSIS — R911 Solitary pulmonary nodule: Secondary | ICD-10-CM | POA: Diagnosis not present

## 2022-12-19 DIAGNOSIS — H02402 Unspecified ptosis of left eyelid: Secondary | ICD-10-CM | POA: Diagnosis not present

## 2022-12-28 DIAGNOSIS — I10 Essential (primary) hypertension: Secondary | ICD-10-CM | POA: Diagnosis not present

## 2022-12-28 NOTE — Progress Notes (Signed)
Remote pacemaker transmission.   

## 2023-01-11 ENCOUNTER — Encounter: Payer: Self-pay | Admitting: *Deleted

## 2023-01-11 ENCOUNTER — Telehealth: Payer: Self-pay | Admitting: *Deleted

## 2023-01-11 NOTE — Patient Outreach (Signed)
  Care Coordination   Initial Visit Note   01/11/2023 Name: ABIE GROH MRN: [id number] DOB: 13-Aug-1955  [name] is a 67 y.o. year old female who sees Vyas, Dhruv B, MD for primary care. I spoke with  [name] by phone today.   Ms. Delarosa was given information about Care Coordination services today including:   The Care Coordination services include support from the care team which includes your Nurse Coordinator, Clinical Social Worker, or Pharmacist.  The Care Coordination team is here to help remove barriers to the health concerns and goals most important to you. Care Coordination services are voluntary, and the patient may decline or stop services at any time by request to their care team member.   Care Coordination Consent Status: Patient did not agree to participate in care coordination services at this time.   What matters to the patients health and wellness today?  Ongoing self health management.     Goals Addressed             This Visit's Progress    COMPLETED: Care Coordination Services (no follow-up needed)       Care Coordination Goals: Patient will follow-up with PCP and/or specialist(s) as recommended Patient will take medications as prescribed Patient will reach out to [facility] Care Management Dept at [phone] or RN Care Coordinator at [phone] with any care coordination or resource needs           SDOH assessments and interventions completed:  Yes  SDOH Interventions Today    Flowsheet Row Most Recent Value  SDOH Interventions   Food Insecurity Interventions Intervention Not Indicated  Housing Interventions Intervention Not Indicated  Transportation Interventions Intervention Not Indicated        Care Coordination Interventions:  Yes, provided  Interventions Today    Flowsheet Row Most Recent Value  Chronic Disease   Chronic disease during today's visit Hypertension (HTN)  General Interventions   General  Interventions Discussed/Reviewed General Interventions Discussed, General Interventions Reviewed, Doctor Visits  Doctor Visits Discussed/Reviewed Doctor Visits Discussed, Doctor Visits Reviewed, PCP, Specialist  PCP/Specialist Visits Compliance with follow-up visit  [follow-up with PCP every 3 to 6 months or as recommended]  Education Interventions   Education Provided Provided Education  Provided Verbal Education On Other  [Provided vebal education on Care Management Services]  Nutrition Interventions   Nutrition Discussed/Reviewed Nutrition Discussed, Nutrition Reviewed  Pharmacy Interventions   Pharmacy Dicussed/Reviewed Pharmacy Topics Discussed, Pharmacy Topics Reviewed  [takes medications regularly and does not have any difficulty with paying for medications]  Safety Interventions   Safety Discussed/Reviewed Safety Discussed, Safety Reviewed       Follow up plan: No further intervention required.   Encounter Outcome:  Pt. Visit Completed   Demetrios Loll, BSN, RN-BC RN Care Coordinator [facility]  [facility] Direct Dial: [phone] Main #: [phone]

## 2023-01-26 ENCOUNTER — Telehealth: Payer: Self-pay | Admitting: *Deleted

## 2023-01-26 NOTE — Telephone Encounter (Signed)
Procedure: Colonoscopy  Estimated body mass index is 33.71 kg/m as calculated from the following:   Height as of 09/30/22: 5\' 4"  (1.626 m).   Weight as of 09/30/22: 196 lb 6.4 oz (89.1 kg).   Have you had a colonoscopy before?  12/11/17, Dr. [name]  Do you have family history of colon cancer?  no  Do you have a family history of polyps? yes  Previous colonoscopy with polyps removed?   Do you have a history colorectal cancer?   no  Are you diabetic?  no  Do you have a prosthetic or mechanical heart valve? no  Do you have a pacemaker/defibrillator?   Yes pacemaker  Have you had endocarditis/atrial fibrillation?  no  Do you use supplemental oxygen/CPAP?  no  Have you had joint replacement within the last 12 months?  no  Do you tend to be constipated or have to use laxatives?  yes   Do you have history of alcohol use? If yes, how much and how often.  no  Do you have history or are you using drugs? If yes, what do are you  using?  no  Have you ever had a stroke/heart attack?  no  Have you ever had a heart or other vascular stent placed,?no  Do you take weight loss medication? no  female patients,: have you had a hysterectomy? yes                              are you post menopausal?                                do you still have your menstrual cycle?     Date of last menstrual period?   Do you take any blood-thinning medications such as: (Plavix, aspirin, Coumadin, Aggrenox, Brilinta, Xarelto, Eliquis, Pradaxa, Savaysa or Effient)? Aspirin 81mg   If yes we need the name, milligram, dosage and who is prescribing doctor:               Current Outpatient Medications  Medication Sig Dispense Refill   albuterol (PROVENTIL HFA;VENTOLIN HFA) 108 (90 BASE) MCG/ACT inhaler Inhale 2 puffs into the lungs every 6 (six) hours as needed for wheezing or shortness of breath.     amLODipine (NORVASC) 10 MG tablet Take 10 mg by mouth daily.     aspirin EC 81 MG tablet Take 1 tablet (81  mg total) by mouth daily. 90 tablet 3   brimonidine (ALPHAGAN) 0.2 % ophthalmic solution Place 1 drop into both eyes 3 (three) times daily.     Bromfenac Sodium (PROLENSA) 0.07 % SOLN Place 1 drop into the left eye in the morning, at noon, in the evening, and at bedtime. 6 mL 6   Bromfenac Sodium 0.07 % SOLN Place 1 drop into the left eye daily. 3 mL 6   carvedilol (COREG) 6.25 MG tablet TAKE ONE TABLET BY MOUTH TWICE DAILY 180 tablet 2   cetirizine (ZYRTEC) 10 MG tablet Take 10 mg by mouth at bedtime as needed for allergies.      COSOPT 22.3-6.8 MG/ML ophthalmic solution Place 1 drop into both eyes 2 (two) times daily.     diclofenac (VOLTAREN) 75 MG EC tablet Take 75 mg by mouth daily as needed for mild pain.     ibuprofen (ADVIL) 200 MG tablet Take 400 mg by mouth every 8 (eight)  hours as needed (pain.).     levothyroxine (SYNTHROID, LEVOTHROID) 112 MCG tablet Take 112 mcg by mouth daily before breakfast.      Niacin (VITAMIN B-3 PO) Take 1 tablet by mouth every 3 (three) days.     nitroGLYCERIN (NITROSTAT) 0.4 MG SL tablet Place 1 tablet (0.4 mg total) under the tongue every 5 (five) minutes as needed for chest pain. (Patient taking differently: Place 0.4 mg under the tongue every 5 (five) minutes x 3 doses as needed for chest pain (if no relief after 3rd dose, proceed to ED or call 911).) 25 tablet 3   pantoprazole (PROTONIX) 40 MG tablet Take 1 tablet (40 mg total) by mouth 2 (two) times daily before a meal. 60 tablet 5   potassium chloride SA (K-DUR,KLOR-CON) 20 MEQ tablet Take 20 mEq by mouth in the morning.     valsartan-hydrochlorothiazide (DIOVAN-HCT) 320-25 MG per tablet Take 1 tablet by mouth in the morning.     No current facility-administered medications for this visit.    Allergies  Allergen Reactions   Crestor [Rosuvastatin] Swelling and Other (See Comments)    Muscle aches/inflammation   Neurontin [Gabapentin] Swelling    Disoriented/"out of it"

## 2023-01-26 NOTE — Telephone Encounter (Signed)
Pt marked tends to be constipated or uses laxatives and will need OV prior. Thanks!

## 2023-01-28 DIAGNOSIS — I10 Essential (primary) hypertension: Secondary | ICD-10-CM | POA: Diagnosis not present

## 2023-02-06 NOTE — Progress Notes (Signed)
GI Office Note    Referring Provider: Ignatius Specking, MD Primary Care Physician:  Ignatius Specking, MD  Primary Gastroenterologist:Charles K. Marletta Lor, DO   Chief Complaint   Chief Complaint  Patient presents with   Follow-up    Follow up for constipation and ov before colonoscopy.     History of Present Illness   Sylvia Anthony is a 67 y.o. female presenting today to schedule surveillance colonoscopy. History of advanced colon polyps remotely. She noted, history of constipation on her colonoscopy survey.   Patient reports that she has a BM daily. If she has issues with constipation, she uses Smooth move tea. She denies melena, brbpr. No abdominal pain. No issues with her reflux or dysphagia.   EGD May 2023: -normal esophagus s/p dilation for h/o dysphagia  -Small hiatal hernia. - Gastritis. Biopsied. - Normal duodenal bulb, first portion of the duodenum and second portion of the duodenum  FINAL MICROSCOPIC DIAGNOSIS:   A. STOMACH, BIOPSY:  -  2 fragments of antralized oxyntic mucosa with intestinal metaplasia  suggestive of atrophy.  -  Separate fragments of oxyntic mucosa with mild chronic inactive  gastritis.  -  An immunohistochemical stain for Helicobacter pylori organisms is  pending and will be reported in an addendum  -H. pylori immunohistochemistry is NEGATIVE for microorganisms.   Colonoscopy 11/2017: -diverticulosis -external hemorrhoids -five year surveillance colonoscopy due to personal history of polyp with high grade dysplasia  Medications   Current Outpatient Medications  Medication Sig Dispense Refill   albuterol (PROVENTIL HFA;VENTOLIN HFA) 108 (90 BASE) MCG/ACT inhaler Inhale 2 puffs into the lungs every 6 (six) hours as needed for wheezing or shortness of breath.     amLODipine (NORVASC) 10 MG tablet Take 10 mg by mouth daily.     aspirin EC 81 MG tablet Take 1 tablet (81 mg total) by mouth daily. 90 tablet 3   brimonidine (ALPHAGAN) 0.2 %  ophthalmic solution Place 1 drop into both eyes 3 (three) times daily.     Bromfenac Sodium (PROLENSA) 0.07 % SOLN Place 1 drop into the left eye in the morning, at noon, in the evening, and at bedtime. 6 mL 6   Bromfenac Sodium 0.07 % SOLN Place 1 drop into the left eye daily. 3 mL 6   carvedilol (COREG) 6.25 MG tablet TAKE ONE TABLET BY MOUTH TWICE DAILY 180 tablet 2   cetirizine (ZYRTEC) 10 MG tablet Take 10 mg by mouth at bedtime as needed for allergies.      COSOPT 22.3-6.8 MG/ML ophthalmic solution Place 1 drop into both eyes 2 (two) times daily.     diclofenac (VOLTAREN) 75 MG EC tablet Take 75 mg by mouth daily as needed for mild pain.     ibuprofen (ADVIL) 200 MG tablet Take 400 mg by mouth every 8 (eight) hours as needed (pain.).     levothyroxine (SYNTHROID, LEVOTHROID) 112 MCG tablet Take 112 mcg by mouth daily before breakfast.      Niacin (VITAMIN B-3 PO) Take 1 tablet by mouth every 3 (three) days.     nitroGLYCERIN (NITROSTAT) 0.4 MG SL tablet Place 1 tablet (0.4 mg total) under the tongue every 5 (five) minutes as needed for chest pain. (Patient taking differently: Place 0.4 mg under the tongue every 5 (five) minutes x 3 doses as needed for chest pain (if no relief after 3rd dose, proceed to ED or call 911).) 25 tablet 3   pantoprazole (PROTONIX) 40 MG tablet  Take 1 tablet (40 mg total) by mouth 2 (two) times daily before a meal. 60 tablet 5   potassium chloride SA (K-DUR,KLOR-CON) 20 MEQ tablet Take 20 mEq by mouth in the morning.     valsartan-hydrochlorothiazide (DIOVAN-HCT) 320-25 MG per tablet Take 1 tablet by mouth in the morning.     No current facility-administered medications for this visit.    Allergies   Allergies as of 02/07/2023 - Review Complete 02/07/2023  Allergen Reaction Noted   Crestor [rosuvastatin] Swelling and Other (See Comments) 10/15/2021   Neurontin [gabapentin] Swelling 12/23/2016    Past Medical History   Past Medical History:  Diagnosis Date    Anemia    Arm numbness    Cataract    Mixed form OU   Fibromyalgia    GERD (gastroesophageal reflux disease)    Glucose intolerance (impaired glucose tolerance)    H. pylori infection 01/2012   Amoxicillin, Biaxin   Hyperlipidemia    Hypertension    Hypertensive retinopathy    OU   Hypothyroidism    Mitral valve prolapse    not seen on recent echoes   Rheumatic fever    age 24, was at [facility] for 17 weeks   SSS (sick sinus syndrome) (HCC)    1997 ppm, gen change 2020 (MDT)    Past Surgical History   Past Surgical History:  Procedure Laterality Date   ABDOMINAL HYSTERECTOMY     partial   APPENDECTOMY     BALLOON DILATION N/A 10/18/2021   Procedure: BALLOON DILATION;  Surgeon: Lanelle Bal, DO;  [facility];  Service: Endoscopy;  Laterality: N/A;   BIOPSY  10/18/2021   Procedure: BIOPSY;  Surgeon: Lanelle Bal, DO;  [facility];  Service: Endoscopy;;   CARDIAC CATHETERIZATION     multiple   CHOLECYSTECTOMY     COLONOSCOPY  08/01/2006   3 mm descending colon polyp removed/8 mm sessile ascending colon polyp removed / 3-mm rectal  polyp removed /Rare sigmoid diverticulosis/ Moderate internal hemorrhoids.Advanced adenoma on colonoscopy in March 2008.  The polyp was anadenomatous polyp with a foci of high-grade dysplasia   COLONOSCOPY  09/08/2008   SIMPLE ADENOMA/HYPERPLASTIC POLY/Multiple colon polyps (ascending, sigmoid, rectal)  Mild sigmoid colon diverticulosis./ Small internal hemorrhoids   COLONOSCOPY N/A 12/11/2017   Dr. [name]: diverticulosis, hemorrhoids next surveillance tcs 5 years.    COLONOSCOPY WITH ESOPHAGOGASTRODUODENOSCOPY (EGD)  Sept. 30, 2013   ZOX:WRUE diverticulosis was noted in the sigmoid colon/The colon was otherwise normal/Small internal hemorrhoids/EGD:The mucosa of the esophagus appeared normal/Non-erosive gastritis (inflammation) was found; multiple bx/The duodenal mucosa showed no abnormalities. +H.pylori  gastritis, treated with equivalent of prevpac. SAVARY DILATION   ESOPHAGEAL DILATION N/A 10/07/2014   Procedure: ESOPHAGEAL DILATION;  Surgeon: West Bali, MD;  [facility];  Service: Endoscopy;  Laterality: N/A;   ESOPHAGOGASTRODUODENOSCOPY N/A 10/07/2014   Dr. [name]: moderate non-erosive gastritis, no definite stricture, empiric dilation . negative H.pylori    ESOPHAGOGASTRODUODENOSCOPY (EGD) WITH PROPOFOL N/A 10/18/2021   Procedure: ESOPHAGOGASTRODUODENOSCOPY (EGD) WITH PROPOFOL;  Surgeon: Lanelle Bal, DO;  [facility];  Service: Endoscopy;  Laterality: N/A;  2:30pm   growth removed from intestine     [facility]-as teenager, done through colonoscopy   PACEMAKER GENERATOR CHANGE  09/18/2006   generator change by Dr Amil Amen with a MDT Adapta L   PACEMAKER INSERTION  09/13/1995   for sick sinus syndome and syncope at [facility]   PPM  GENERATOR CHANGEOUT N/A 06/11/2018    Medtronic Azure XT DR MRI SureScan model [id number] (serial number [id number]) pacemaker by Dr Johney Frame   right Achilles tendon     X 2   TONSILLECTOMY      Past Family History   Family History  Problem Relation Age of Onset   Hyperlipidemia Mother    Hypertension Mother    Heart disease Father    Hypertension Father    Hyperlipidemia Sister    Hypertension Sister    Colon polyps Sister    Hyperlipidemia Brother    Hypertension Brother    Colon polyps Brother    Colon cancer Neg Hx     Past Social History   Social History   Socioeconomic History   Marital status: Married    Spouse name: Not on file   Number of children: 1   Years of education: Not on file   Highest education level: Not on file  Occupational History   Occupation: part-time hairdresser  Tobacco Use   Smoking status: Former    Current packs/day: 0.00    Average packs/day: 0.3 packs/day for 4.0 years (1.0 ttl pk-yrs)    Types: Cigarettes    Start date: 10/09/1981    Quit date: 10/09/1985    Years since  quitting: 37.3   Smokeless tobacco: Never   Tobacco comments:    just in teens  Vaping Use   Vaping status: Never Used  Substance and Sexual Activity   Alcohol use: No    Alcohol/week: 0.0 standard drinks of alcohol   Drug use: No   Sexual activity: Not Currently  Other Topics Concern   Not on file  Social History Narrative   Lives in [place] with spouse.  Son is healthy at age 71.   Disabled         Social Determinants of Health   Financial Resource Strain: Low Risk  (01/11/2023)   Overall Financial Resource Strain (CARDIA)    Difficulty of Paying Living Expenses: Not very hard  Food Insecurity: No Food Insecurity (01/11/2023)   Hunger Vital Sign    Worried About Running Out of Food in the Last Year: Never true    Ran Out of Food in the Last Year: Never true  Transportation Needs: No Transportation Needs (01/11/2023)   PRAPARE - Administrator, Civil Service (Medical): No    Lack of Transportation (Non-Medical): No  Physical Activity: Not on file  Stress: Not on file  Social Connections: Not on file  Intimate Partner Violence: Not on file    Review of Systems   General: Negative for anorexia, weight loss, fever, chills, fatigue, weakness. Eyes: Negative for vision changes.  ENT: Negative for hoarseness, difficulty swallowing , nasal congestion. CV: Negative for chest pain, angina, palpitations, dyspnea on exertion, peripheral edema.  Respiratory: Negative for dyspnea at rest, dyspnea on exertion, cough, sputum, wheezing.  GI: See history of present illness. GU:  Negative for dysuria, hematuria, urinary incontinence, urinary frequency, nocturnal urination.  MS: Negative for joint pain, low back pain.  Derm: Negative for rash or itching.  Neuro: Negative for weakness, abnormal sensation, seizure, frequent headaches, memory loss,  confusion.  Psych: Negative for anxiety, depression, suicidal ideation, hallucinations.  Endo: Negative for unusual weight change.   Heme: Negative for bruising or bleeding. Allergy: Negative for rash or hives.  Physical Exam   BP 110/71   Pulse 77   Temp 98.6 F (37 C)   Ht 5\' 4"  (1.626 m)  Wt 194 lb 3.2 oz (88.1 kg)   BMI 33.33 kg/m    General: Well-nourished, well-developed in no acute distress.  Head: Normocephalic, atraumatic.   Eyes: Conjunctiva pink, no icterus. Mouth: Oropharyngeal mucosa moist and pink , no lesions erythema or exudate. Neck: Supple without thyromegaly, masses, or lymphadenopathy.  Lungs: Clear to auscultation bilaterally.  Heart: Regular rate and rhythm, no murmurs rubs or gallops.  Abdomen: Bowel sounds are normal, nontender, nondistended, no hepatosplenomegaly or masses,  no abdominal bruits or hernia, no rebound or guarding.   Rectal: not performed Extremities: No lower extremity edema. No clubbing or deformities.  Neuro: Alert and oriented x 4 , grossly normal neurologically.  Skin: Warm and dry, no rash or jaundice.   Psych: Alert and cooperative, normal mood and affect.  Labs   No recent labs available  Imaging Studies   No results found.  Assessment   *H/O adenomatous colon polyps *GERD  Clinically GERD well controlled. Will try to reduce PPI to daily. Due for surveillance colonoscopy at this time.  PLAN   She can try to reduce her pantoprazole to once daily if tolerated. If recurrent frequent reflux symptoms, she will go back to BID. Colonoscopy with Dr. Marletta Lor. ASA 3.  I have discussed the risks, alternatives, benefits with regards to but not limited to the risk of reaction to medication, bleeding, infection, perforation and the patient is agreeable to proceed. Written consent to be obtained.    Leanna Battles. Melvyn Neth, MHS, PA-C [facility]

## 2023-02-07 ENCOUNTER — Ambulatory Visit: Payer: Medicare Other | Admitting: Gastroenterology

## 2023-02-07 ENCOUNTER — Encounter: Payer: Self-pay | Admitting: Gastroenterology

## 2023-02-07 VITALS — BP 110/71 | HR 77 | Temp 98.6°F | Ht 64.0 in | Wt 194.2 lb

## 2023-02-07 DIAGNOSIS — K5900 Constipation, unspecified: Secondary | ICD-10-CM | POA: Diagnosis not present

## 2023-02-07 DIAGNOSIS — Z86010 Personal history of colonic polyps: Secondary | ICD-10-CM | POA: Diagnosis not present

## 2023-02-07 NOTE — Patient Instructions (Signed)
Colonoscopy to be scheduled.  For reflux, since you are doing well, you can consider reducing your pantoprazole to once daily if tolerated. However, if you start having a lot of heartburn or night time reflux symptoms, then you can go back to twice daily.

## 2023-02-14 ENCOUNTER — Encounter: Payer: Self-pay | Admitting: *Deleted

## 2023-02-14 NOTE — Telephone Encounter (Signed)
[employer] PA:  Notification or Prior Authorization is not required for the requested services You are not required to submit a notification/prior authorization based on the information provided. If you have general questions about the prior authorization requirements, visit [email or web address] > Clinician Resources > Advance and Admission Notification Requirements. The number above acknowledges your notification. Please write this reference number down for future reference. If you would like to request an organization determination, please call us at [phone]. Decision ID #: [id number]

## 2023-02-15 ENCOUNTER — Encounter: Payer: Self-pay | Admitting: *Deleted

## 2023-02-27 DIAGNOSIS — I10 Essential (primary) hypertension: Secondary | ICD-10-CM | POA: Diagnosis not present

## 2023-03-06 DIAGNOSIS — H401132 Primary open-angle glaucoma, bilateral, moderate stage: Secondary | ICD-10-CM | POA: Diagnosis not present

## 2023-03-08 ENCOUNTER — Ambulatory Visit (INDEPENDENT_AMBULATORY_CARE_PROVIDER_SITE_OTHER): Payer: Medicare Other

## 2023-03-08 DIAGNOSIS — I495 Sick sinus syndrome: Secondary | ICD-10-CM

## 2023-03-08 LAB — CUP PACEART REMOTE DEVICE CHECK
Battery Remaining Longevity: 116 mo
Battery Voltage: 3.01 V
Brady Statistic AP VP Percent: 0.04 %
Brady Statistic AP VS Percent: 88.03 %
Brady Statistic AS VP Percent: 0 %
Brady Statistic AS VS Percent: 11.93 %
Brady Statistic RA Percent Paced: 88.47 %
Brady Statistic RV Percent Paced: 0.04 %
Date Time Interrogation Session: 20241009020903
Implantable Lead Connection Status: 753985
Implantable Lead Connection Status: 753985
Implantable Lead Implant Date: 19970416
Implantable Lead Implant Date: 19970416
Implantable Lead Location: 753859
Implantable Lead Location: 753860
Implantable Lead Model: 5034
Implantable Lead Model: 5534
Implantable Pulse Generator Implant Date: 20200113
Lead Channel Impedance Value: 684 Ohm
Lead Channel Impedance Value: 741 Ohm
Lead Channel Impedance Value: 741 Ohm
Lead Channel Impedance Value: 760 Ohm
Lead Channel Pacing Threshold Amplitude: 0.625 V
Lead Channel Pacing Threshold Amplitude: 0.625 V
Lead Channel Pacing Threshold Pulse Width: 0.4 ms
Lead Channel Pacing Threshold Pulse Width: 0.4 ms
Lead Channel Sensing Intrinsic Amplitude: 2 mV
Lead Channel Sensing Intrinsic Amplitude: 2 mV
Lead Channel Sensing Intrinsic Amplitude: 21.375 mV
Lead Channel Sensing Intrinsic Amplitude: 21.375 mV
Lead Channel Setting Pacing Amplitude: 1.5 V
Lead Channel Setting Pacing Amplitude: 2.5 V
Lead Channel Setting Pacing Pulse Width: 0.4 ms
Lead Channel Setting Sensing Sensitivity: 2 mV
Zone Setting Status: 755011
Zone Setting Status: 755011

## 2023-03-13 NOTE — Patient Instructions (Signed)
   Your procedure is scheduled on: 03/20/2023  Report to [facility] Main Entrance at   8:30  AM.  Call this number if you have problems the morning of surgery: [phone]   Remember:              Follow Directions on the letter you received from Your Physician's office regarding the Bowel Prep              No Smoking the day of Procedure :              You may have CLEAR liquids until 6:30 am   Take these medicines the morning of surgery with A SIP OF WATER: Amlodipine, Carvedilol, levothyroxine, and pantoprazole   Do not wear jewelry, make-up or nail polish.    Do not bring valuables to the hospital.  Contacts, dentures or bridgework may not be worn into surgery.  .   Patients discharged the day of surgery will not be allowed to drive home.     Colonoscopy, Adult, Care After This sheet gives you information about how to care for yourself after your procedure. Your health care provider may also give you more specific instructions. If you have problems or questions, contact your health care provider. What can I expect after the procedure? After the procedure, it is common to have: A small amount of blood in your stool for 24 hours after the procedure. Some gas. Mild abdominal cramping or bloating.  Follow these instructions at home: General instructions  For the first 24 hours after the procedure: Do not drive or use machinery. Do not sign important documents. Do not drink alcohol. Do your regular daily activities at a slower pace than normal. Eat soft, easy-to-digest foods. Rest often. Take over-the-counter or prescription medicines only as told by your health care provider. It is up to you to get the results of your procedure. Ask your health care provider, or the department performing the procedure, when your results will be ready. Relieving cramping and bloating Try walking around when you have cramps or feel bloated. Apply heat to your abdomen as told by your health  care provider. Use a heat source that your health care provider recommends, such as a moist heat pack or a heating pad. Place a towel between your skin and the heat source. Leave the heat on for 20-30 minutes. Remove the heat if your skin turns bright red. This is especially important if you are unable to feel pain, heat, or cold. You may have a greater risk of getting burned. Eating and drinking Drink enough fluid to keep your urine clear or pale yellow. Resume your normal diet as instructed by your health care provider. Avoid heavy or fried foods that are hard to digest. Avoid drinking alcohol for as long as instructed by your health care provider. Contact a health care provider if: You have blood in your stool 2-3 days after the procedure. Get help right away if: You have more than a small spotting of blood in your stool. You pass large blood clots in your stool. Your abdomen is swollen. You have nausea or vomiting. You have a fever. You have increasing abdominal pain that is not relieved with medicine. This information is not intended to replace advice given to you by your health care provider. Make sure you discuss any questions you have with your health care provider. Document Released: 12/29/2003 Document Revised: 02/08/2016 Document Reviewed: 07/28/2015 Elsevier Interactive Patient Education  [employer].

## 2023-03-14 ENCOUNTER — Encounter (HOSPITAL_COMMUNITY): Payer: Self-pay

## 2023-03-14 ENCOUNTER — Encounter (HOSPITAL_COMMUNITY)
Admission: RE | Admit: 2023-03-14 | Discharge: 2023-03-14 | Disposition: A | Discharge status: Home / Self Care | Payer: Medicare Other | Source: Ambulatory Visit | Attending: Internal Medicine

## 2023-03-14 VITALS — BP 126/71 | HR 83 | Temp 98.1°F | Resp 17 | Ht 64.0 in | Wt 192.5 lb

## 2023-03-14 DIAGNOSIS — Z79899 Other long term (current) drug therapy: Secondary | ICD-10-CM | POA: Diagnosis not present

## 2023-03-14 DIAGNOSIS — Z01818 Encounter for other preprocedural examination: Secondary | ICD-10-CM | POA: Diagnosis not present

## 2023-03-14 HISTORY — DX: Presence of cardiac pacemaker: Z95.0

## 2023-03-14 LAB — BASIC METABOLIC PANEL
Anion gap: 10 (ref 5–15)
BUN: 12 mg/dL (ref 8–23)
CO2: 26 mmol/L (ref 22–32)
Calcium: 9.3 mg/dL (ref 8.9–10.3)
Chloride: 104 mmol/L (ref 98–111)
Creatinine, Ser: 0.82 mg/dL (ref 0.44–1.00)
GFR, Estimated: >60 mL/min (ref >60)
Glucose, Bld: 77 mg/dL (ref 70–99)
Potassium: 3.5 mmol/L (ref 3.5–5.1)
Sodium: 140 mmol/L (ref 135–145)

## 2023-03-15 ENCOUNTER — Other Ambulatory Visit (HOSPITAL_COMMUNITY): Payer: Medicare Other

## 2023-03-19 NOTE — Plan of Care (Signed)
[facility] Tonsillectomy/Adenoidectomy, Postoperative PEDS care plan entered in error.

## 2023-03-20 ENCOUNTER — Encounter (HOSPITAL_COMMUNITY): Payer: Self-pay

## 2023-03-20 ENCOUNTER — Ambulatory Visit (HOSPITAL_COMMUNITY)
Admission: RE | Admit: 2023-03-20 | Discharge: 2023-03-20 | Disposition: A | Discharge status: Home / Self Care | Payer: Medicare Other | Attending: Internal Medicine | Admitting: Internal Medicine

## 2023-03-20 ENCOUNTER — Encounter (HOSPITAL_COMMUNITY)
Admission: RE | Disposition: A | Discharge status: Home / Self Care | Payer: Self-pay | Source: Home / Self Care | Attending: Internal Medicine

## 2023-03-20 ENCOUNTER — Ambulatory Visit (HOSPITAL_COMMUNITY): Payer: Medicare Other | Admitting: Anesthesiology

## 2023-03-20 DIAGNOSIS — Z860101 Personal history of adenomatous and serrated colon polyps: Secondary | ICD-10-CM | POA: Diagnosis not present

## 2023-03-20 DIAGNOSIS — Z1211 Encounter for screening for malignant neoplasm of colon: Secondary | ICD-10-CM | POA: Diagnosis not present

## 2023-03-20 DIAGNOSIS — E039 Hypothyroidism, unspecified: Secondary | ICD-10-CM | POA: Diagnosis not present

## 2023-03-20 DIAGNOSIS — K644 Residual hemorrhoidal skin tags: Secondary | ICD-10-CM | POA: Diagnosis not present

## 2023-03-20 DIAGNOSIS — K5730 Diverticulosis of large intestine without perforation or abscess without bleeding: Secondary | ICD-10-CM

## 2023-03-20 DIAGNOSIS — K219 Gastro-esophageal reflux disease without esophagitis: Secondary | ICD-10-CM | POA: Insufficient documentation

## 2023-03-20 DIAGNOSIS — I10 Essential (primary) hypertension: Secondary | ICD-10-CM | POA: Diagnosis not present

## 2023-03-20 DIAGNOSIS — Z860100 Personal history of colon polyps, unspecified: Secondary | ICD-10-CM | POA: Diagnosis not present

## 2023-03-20 DIAGNOSIS — Z79899 Other long term (current) drug therapy: Secondary | ICD-10-CM | POA: Insufficient documentation

## 2023-03-20 DIAGNOSIS — Z09 Encounter for follow-up examination after completed treatment for conditions other than malignant neoplasm: Secondary | ICD-10-CM | POA: Diagnosis not present

## 2023-03-20 DIAGNOSIS — K648 Other hemorrhoids: Secondary | ICD-10-CM | POA: Insufficient documentation

## 2023-03-20 DIAGNOSIS — I341 Nonrheumatic mitral (valve) prolapse: Secondary | ICD-10-CM | POA: Diagnosis not present

## 2023-03-20 HISTORY — PX: COLONOSCOPY WITH PROPOFOL: SHX5780

## 2023-03-20 SURGERY — COLONOSCOPY WITH PROPOFOL
Anesthesia: General

## 2023-03-20 MED ORDER — EPHEDRINE SULFATE (PRESSORS) 50 MG/ML IJ SOLN
INTRAMUSCULAR | Status: DC | PRN
  Administered 2023-03-20: 10 mg via INTRAVENOUS

## 2023-03-20 MED ORDER — PROPOFOL 10 MG/ML IV BOLUS
INTRAVENOUS | Status: DC | PRN
  Administered 2023-03-20: 100 mg via INTRAVENOUS

## 2023-03-20 MED ORDER — LIDOCAINE HCL 1 % IJ SOLN
INTRAMUSCULAR | Status: DC | PRN
  Administered 2023-03-20: 50 mg via INTRADERMAL

## 2023-03-20 MED ORDER — PROPOFOL 500 MG/50ML IV EMUL
INTRAVENOUS | Status: DC | PRN
  Administered 2023-03-20: 150 ug/kg/min via INTRAVENOUS

## 2023-03-20 MED ORDER — STERILE WATER FOR IRRIGATION IR SOLN
Status: DC | PRN
  Administered 2023-03-20: 100 mL

## 2023-03-20 MED ORDER — LACTATED RINGERS IV SOLN: INTRAVENOUS | Status: DC | PRN

## 2023-03-20 NOTE — Transfer of Care (Addendum)
Immediate Anesthesia Transfer of Care Note  Patient: Sylvia Anthony  Procedure(s) Performed: COLONOSCOPY WITH PROPOFOL  Patient Location: Short Stay  Anesthesia Type:General  Level of Consciousness: awake  Airway & Oxygen Therapy: Patient Spontanous Breathing  Post-op Assessment: Report given to RN  Post vital signs: Reviewed and stable  Last Vitals:  Vitals Value Taken Time  BP    Temp    Pulse    Resp    SpO2      Last Pain:  Vitals:   03/20/23 1023  TempSrc:   PainSc: 0-No pain         Complications: No notable events documented.

## 2023-03-20 NOTE — Discharge Instructions (Signed)
  Colonoscopy Discharge Instructions  Read the instructions outlined below and refer to this sheet in the next few weeks. These discharge instructions provide you with general information on caring for yourself after you leave the hospital. Your doctor may also give you specific instructions. While your treatment has been planned according to the most current medical practices available, unavoidable complications occasionally occur.   ACTIVITY You may resume your regular activity, but move at a slower pace for the next 24 hours.  Take frequent rest periods for the next 24 hours.  Walking will help get rid of the air and reduce the bloated feeling in your belly (abdomen).  No driving for 24 hours (because of the medicine (anesthesia) used during the test).   Do not sign any important legal documents or operate any machinery for 24 hours (because of the anesthesia used during the test).  NUTRITION Drink plenty of fluids.  You may resume your normal diet as instructed by your doctor.  Begin with a light meal and progress to your normal diet. Heavy or fried foods are harder to digest and may make you feel sick to your stomach (nauseated).  Avoid alcoholic beverages for 24 hours or as instructed.  MEDICATIONS You may resume your normal medications unless your doctor tells you otherwise.  WHAT YOU CAN EXPECT TODAY Some feelings of bloating in the abdomen.  Passage of more gas than usual.  Spotting of blood in your stool or on the toilet paper.  IF YOU HAD POLYPS REMOVED DURING THE COLONOSCOPY: No aspirin products for 7 days or as instructed.  No alcohol for 7 days or as instructed.  Eat a soft diet for the next 24 hours.  FINDING OUT THE RESULTS OF YOUR TEST Not all test results are available during your visit. If your test results are not back during the visit, make an appointment with your caregiver to find out the results. Do not assume everything is normal if you have not heard from your  caregiver or the medical facility. It is important for you to follow up on all of your test results.  SEEK IMMEDIATE MEDICAL ATTENTION IF: You have more than a spotting of blood in your stool.  Your belly is swollen (abdominal distention).  You are nauseated or vomiting.  You have a temperature over 101.  You have abdominal pain or discomfort that is severe or gets worse throughout the day.   Your colonoscopy was relatively unremarkable.  I did not find any polyps or evidence of colon cancer.  I recommend repeating colonoscopy in 5 years for surveillance purposes. You do have diverticulosis and internal hemorrhoids. I would recommend increasing fiber in your diet or adding OTC Benefiber/Metamucil. Be sure to drink at least 4 to 6 glasses of water daily. Follow-up with GI in 1 year   I hope you have a great rest of your week!  Hennie Duos. Marletta Lor, D.O. Gastroenterology and Hepatology [facility]

## 2023-03-20 NOTE — H&P (Signed)
Primary Care Physician:  Ignatius Specking, MD Primary Gastroenterologist:  Dr. Marletta Lor  Pre-Procedure History & Physical: HPI:  Sylvia Anthony is a 67 y.o. female is here for a colonoscopy to be performed for surveillance purposes, personal history of adenomatous colon polyps   Past Medical History:  Diagnosis Date   Anemia    Arm numbness    Cataract    Mixed form OU   Fibromyalgia    GERD (gastroesophageal reflux disease)    Glucose intolerance (impaired glucose tolerance)    H. pylori infection 01/2012   Amoxicillin, Biaxin   Hyperlipidemia    Hypertension    Hypertensive retinopathy    OU   Hypothyroidism    Mitral valve prolapse    not seen on recent echoes   Presence of permanent cardiac pacemaker    Rheumatic fever    age 72, was at [facility] for 17 weeks   SSS (sick sinus syndrome) (HCC)    1997 ppm, gen change 2020 (MDT)    Past Surgical History:  Procedure Laterality Date   ABDOMINAL HYSTERECTOMY     partial   APPENDECTOMY     BALLOON DILATION N/A 10/18/2021   Procedure: BALLOON DILATION;  Surgeon: Lanelle Bal, DO;  [facility];  Service: Endoscopy;  Laterality: N/A;   BIOPSY  10/18/2021   Procedure: BIOPSY;  Surgeon: Lanelle Bal, DO;  [facility];  Service: Endoscopy;;   CARDIAC CATHETERIZATION     multiple   CHOLECYSTECTOMY     COLONOSCOPY  08/01/2006   3 mm descending colon polyp removed/8 mm sessile ascending colon polyp removed / 3-mm rectal  polyp removed /Rare sigmoid diverticulosis/ Moderate internal hemorrhoids.Advanced adenoma on colonoscopy in March 2008.  The polyp was anadenomatous polyp with a foci of high-grade dysplasia   COLONOSCOPY  09/08/2008   SIMPLE ADENOMA/HYPERPLASTIC POLY/Multiple colon polyps (ascending, sigmoid, rectal)  Mild sigmoid colon diverticulosis./ Small internal hemorrhoids   COLONOSCOPY N/A 12/11/2017   Dr. [name]: diverticulosis, hemorrhoids next surveillance tcs 5 years.     COLONOSCOPY WITH ESOPHAGOGASTRODUODENOSCOPY (EGD)  Sept. 30, 2013   GEX:BMWU diverticulosis was noted in the sigmoid colon/The colon was otherwise normal/Small internal hemorrhoids/EGD:The mucosa of the esophagus appeared normal/Non-erosive gastritis (inflammation) was found; multiple bx/The duodenal mucosa showed no abnormalities. +H.pylori gastritis, treated with equivalent of prevpac. SAVARY DILATION   ESOPHAGEAL DILATION N/A 10/07/2014   Procedure: ESOPHAGEAL DILATION;  Surgeon: West Bali, MD;  [facility];  Service: Endoscopy;  Laterality: N/A;   ESOPHAGOGASTRODUODENOSCOPY N/A 10/07/2014   Dr. [name]: moderate non-erosive gastritis, no definite stricture, empiric dilation . negative H.pylori    ESOPHAGOGASTRODUODENOSCOPY (EGD) WITH PROPOFOL N/A 10/18/2021   Procedure: ESOPHAGOGASTRODUODENOSCOPY (EGD) WITH PROPOFOL;  Surgeon: Lanelle Bal, DO;  [facility];  Service: Endoscopy;  Laterality: N/A;  2:30pm   growth removed from intestine     [facility]-as teenager, done through colonoscopy   PACEMAKER GENERATOR CHANGE  09/18/2006   generator change by Dr Amil Amen with a MDT Adapta L   PACEMAKER INSERTION  09/13/1995   for sick sinus syndome and syncope at [facility] GENERATOR CHANGEOUT N/A 06/11/2018    [employer] Azure XT DR MRI SureScan model [id number] (serial number [id number]) pacemaker by Dr Johney Frame   right Achilles tendon     X 2   TONSILLECTOMY      Prior to Admission medications   Medication Sig Start Date End Date Taking? Authorizing Provider  amLODipine (NORVASC) 10 MG tablet Take 10 mg by mouth daily. 08/13/21  Yes [provider]  brimonidine (ALPHAGAN) 0.2 % ophthalmic solution Place 1 drop into both eyes 3 (three) times daily. 06/01/21  Yes [provider]  Bromfenac Sodium (PROLENSA) 0.07 % SOLN Place 1 drop into the left eye in the morning, at noon, in the evening, and at bedtime. 07/11/22  Yes Rennis Chris, MD  Bromfenac  Sodium 0.07 % SOLN Place 1 drop into the left eye daily. 07/11/22  Yes Rennis Chris, MD  carvedilol (COREG) 6.25 MG tablet TAKE ONE TABLET BY MOUTH TWICE DAILY 11/16/21  Yes Lyn Records, MD  COSOPT 22.3-6.8 MG/ML ophthalmic solution Place 1 drop into both eyes 2 (two) times daily. 06/01/21  Yes [provider]  levothyroxine (SYNTHROID, LEVOTHROID) 112 MCG tablet Take 112 mcg by mouth daily before breakfast.  12/12/17  Yes [provider]  Niacin (VITAMIN B-3 PO) Take 1 tablet by mouth every 3 (three) days.   Yes [provider]  pantoprazole (PROTONIX) 40 MG tablet Take 1 tablet (40 mg total) by mouth 2 (two) times daily before a meal. 09/03/14  Yes Tiffany Kocher, PA-C  potassium chloride SA (K-DUR,KLOR-CON) 20 MEQ tablet Take 20 mEq by mouth in the morning.   Yes [provider]  valsartan-hydrochlorothiazide (DIOVAN-HCT) 320-25 MG per tablet Take 1 tablet by mouth in the morning.   Yes [provider]  albuterol (PROVENTIL HFA;VENTOLIN HFA) 108 (90 BASE) MCG/ACT inhaler Inhale 2 puffs into the lungs every 6 (six) hours as needed for wheezing or shortness of breath.    [provider]  aspirin EC 81 MG tablet Take 1 tablet (81 mg total) by mouth daily. 06/22/18   Marily Lente, NP  cetirizine (ZYRTEC) 10 MG tablet Take 10 mg by mouth at bedtime as needed for allergies.     [provider]  diclofenac (VOLTAREN) 75 MG EC tablet Take 75 mg by mouth daily as needed for mild pain.    [provider]  ibuprofen (ADVIL) 200 MG tablet Take 400 mg by mouth every 8 (eight) hours as needed (pain.).    [provider]  nitroGLYCERIN (NITROSTAT) 0.4 MG SL tablet Place 1 tablet (0.4 mg total) under the tongue every 5 (five) minutes as needed for chest pain. Patient taking differently: Place 0.4 mg under the tongue every 5 (five) minutes x 3 doses as needed for chest pain (if no relief after 3rd dose, proceed to ED or call 911).  08/15/16   Lyn Records, MD    Allergies as of 02/14/2023 - Review Complete 02/07/2023  Allergen Reaction Noted   Crestor [rosuvastatin] Swelling and Other (See Comments) 10/15/2021   Neurontin [gabapentin] Swelling 12/23/2016    Family History  Problem Relation Age of Onset   Hyperlipidemia Mother    Hypertension Mother    Heart disease Father    Hypertension Father    Hyperlipidemia Sister    Hypertension Sister    Colon polyps Sister    Hyperlipidemia Brother    Hypertension Brother    Colon polyps Brother    Colon cancer Neg Hx     Social History   Socioeconomic History   Marital status: Married    Spouse name: Not on file   Number of children: 1   Years of education: Not on file   Highest education level: Not on file  Occupational History   Occupation: part-time hairdresser  Tobacco Use  Smoking status: Former    Current packs/day: 0.00    Average packs/day: 0.3 packs/day for 4.0 years (1.0 ttl pk-yrs)    Types: Cigarettes    Start date: 10/09/1981    Quit date: 10/09/1985    Years since quitting: 37.4   Smokeless tobacco: Never   Tobacco comments:    just in teens  Vaping Use   Vaping status: Never Used  Substance and Sexual Activity   Alcohol use: No    Alcohol/week: 0.0 standard drinks of alcohol   Drug use: No   Sexual activity: Not Currently  Other Topics Concern   Not on file  Social History Narrative   Lives in [place] with spouse.  Son is healthy at age 47.   Disabled         Social Determinants of Health   Financial Resource Strain: Low Risk  (01/11/2023)   Overall Financial Resource Strain (CARDIA)    Difficulty of Paying Living Expenses: Not very hard  Food Insecurity: No Food Insecurity (01/11/2023)   Hunger Vital Sign    Worried About Running Out of Food in the Last Year: Never true    Ran Out of Food in the Last Year: Never true  Transportation Needs: No Transportation Needs (01/11/2023)   PRAPARE - Scientist, research (physical sciences) (Medical): No    Lack of Transportation (Non-Medical): No  Physical Activity: Not on file  Stress: Not on file  Social Connections: Not on file  Intimate Partner Violence: Not on file    Review of Systems: See HPI, otherwise negative ROS  Physical Exam: Vital signs in last 24 hours: Temp:  [97.9 F (36.6 C)] 97.9 F (36.6 C) (10/21 0850) Pulse Rate:  [62] 62 (10/21 0850) Resp:  [14] 14 (10/21 0850) BP: (116)/(64) 116/64 (10/21 0850) SpO2:  [96 %] 96 % (10/21 0850) Weight:  [87.3 kg] 87.3 kg (10/21 0850)   General:   Alert,  Well-developed, well-nourished, pleasant and cooperative in NAD Head:  Normocephalic and atraumatic. Eyes:  Sclera clear, no icterus.   Conjunctiva pink. Ears:  Normal auditory acuity. Nose:  No deformity, discharge,  or lesions. Msk:  Symmetrical without gross deformities. Normal posture. Extremities:  Without clubbing or edema. Neurologic:  Alert and  oriented x4;  grossly normal neurologically. Skin:  Intact without significant lesions or rashes. Psych:  Alert and cooperative. Normal mood and affect.  Impression/Plan: Sylvia Anthony is here for a colonoscopy to be performed for surveillance purposes, personal history of adenomatous colon polyps   The risks of the procedure including infection, bleed, or perforation as well as benefits, limitations, alternatives and imponderables have been reviewed with the patient. Questions have been answered. All parties agreeable.

## 2023-03-20 NOTE — Op Note (Signed)
[facility] Patient Name: Sylvia Anthony Procedure Date: 03/20/2023 10:12 AM MRN: [id number] Date of Birth: Jul 30, 1955 Attending MD: Hennie Duos. Marletta Lor , [place], [id number] CSN: [id number] Age: 67 Admit Type: Outpatient Procedure:                Colonoscopy Indications:              High risk colon cancer surveillance: Personal                            history of adenoma with high grade dysplasia Providers:                Hennie Duos. Marletta Lor, DO, Angelica Ran, Dyann Ruddle Referring MD:              Medicines:                See the Anesthesia note for documentation of the                            administered medications Complications:            No immediate complications. Estimated Blood Loss:     Estimated blood loss: none. Procedure:                Pre-Anesthesia Assessment:                           - The anesthesia plan was to use monitored                            anesthesia care (MAC).                           After obtaining informed consent, the colonoscope                            was passed under direct vision. Throughout the                            procedure, the patient's blood pressure, pulse, and                            oxygen saturations were monitored continuously. The                            [id number]-HQ190L ([id number]) scope was introduced through                            the anus and advanced to the the cecum, identified                            by appendiceal orifice and ileocecal valve. The                            colonoscopy was performed without difficulty. The  patient tolerated the procedure well. The quality                            of the bowel preparation was evaluated using the                            BBPS [facility] Bowel Preparation Scale) with scores                            of: Right Colon = 2 (minor amount of residual                            staining, small fragments of stool and/or opaque                             liquid, but mucosa seen well), Transverse Colon = 3                            (entire mucosa seen well with no residual staining,                            small fragments of stool or opaque liquid) and Left                            Colon = 2 (minor amount of residual staining, small                            fragments of stool and/or opaque liquid, but mucosa                            seen well). The total BBPS score equals 7. The                            quality of the bowel preparation was good. Scope In: 10:27:44 AM Scope Out: 10:41:23 AM Scope Withdrawal Time: 0 hours 10 minutes 49 seconds  Total Procedure Duration: 0 hours 13 minutes 39 seconds  Findings:      Hemorrhoids were found on perianal exam.      Non-bleeding internal hemorrhoids were found during endoscopy.      Multiple medium-mouthed and small-mouthed diverticula were found in the       sigmoid colon and descending colon.      The exam was otherwise without abnormality. Impression:               - Hemorrhoids found on perianal exam.                           - Non-bleeding internal hemorrhoids.                           - Diverticulosis in the sigmoid colon and in the                            descending colon.                           -  The examination was otherwise normal.                           - No specimens collected. Moderate Sedation:      Per Anesthesia Care Recommendation:           - Patient has a contact number available for                            emergencies. The signs and symptoms of potential                            delayed complications were discussed with the                            patient. Return to normal activities tomorrow.                            Written discharge instructions were provided to the                            patient.                           - Resume previous diet.                           - Continue present medications.                            - Repeat colonoscopy in 5 years for surveillance.                           - Return to GI office in 1 year. Procedure Code(s):        --- Professional ---                           R5188, Colorectal cancer screening; colonoscopy on                            individual at high risk Diagnosis Code(s):        --- Professional ---                           Z86.010, Personal history of colonic polyps                           K64.8, Other hemorrhoids                           K57.30, Diverticulosis of large intestine without                            perforation or abscess without bleeding CPT copyright 2022 American Medical Association. All rights reserved. The codes documented in this report are preliminary and upon coder review may  be revised to meet current compliance requirements. Hennie Duos. Marletta Lor,  DO Hennie Duos. Marletta Lor, DO 03/20/2023 10:46:43 AM This report has been signed electronically. Number of Addenda: 0

## 2023-03-20 NOTE — Anesthesia Preprocedure Evaluation (Addendum)
Anesthesia Evaluation  Patient identified by MRN, date of birth, ID band Patient awake    Reviewed: Allergy & Precautions, NPO status , Patient's Chart, lab work & pertinent test results, reviewed documented beta blocker date and time   History of Anesthesia Complications Negative for: history of anesthetic complications  Airway Mallampati: II  TM Distance: >3 FB Neck ROM: Full    Dental no notable dental hx. (+) Dental Advisory Given, Missing   Pulmonary former smoker   Pulmonary exam normal breath sounds clear to auscultation       Cardiovascular Exercise Tolerance: Good hypertension, Pt. on medications and Pt. on home beta blockers Normal cardiovascular exam+ pacemaker + Valvular Problems/Murmurs MVP  Rhythm:Regular Rate:Normal  SSS   Neuro/Psych  Neuromuscular disease  negative psych ROS   GI/Hepatic Neg liver ROS,GERD  Medicated,,  Endo/Other  Hypothyroidism  Impaired glucose tolerance  Renal/GU negative Renal ROS  negative genitourinary   Musculoskeletal  (+)  Fibromyalgia -  Abdominal   Peds negative pediatric ROS (+)  Hematology  (+) Blood dyscrasia, anemia   Anesthesia Other Findings AS per patient, sh had h/o hole in her heart, reviewed Echo, no significant findings  Reproductive/Obstetrics negative OB ROS                              Anesthesia Physical Anesthesia Plan  ASA: 3  Anesthesia Plan: General   Post-op Pain Management: Minimal or no pain anticipated   Induction: Intravenous  PONV Risk Score and Plan: Propofol infusion  Airway Management Planned: Nasal Cannula and Natural Airway  Additional Equipment: None  Intra-op Plan:   Post-operative Plan:   Informed Consent: I have reviewed the patients History and Physical, chart, labs and discussed the procedure including the risks, benefits and alternatives for the proposed anesthesia with the patient or  authorized representative who has indicated his/her understanding and acceptance.     Dental advisory given  Plan Discussed with: CRNA and Surgeon  Anesthesia Plan Comments:          Anesthesia Quick Evaluation

## 2023-03-20 NOTE — Anesthesia Postprocedure Evaluation (Signed)
Anesthesia Post Note  Patient: Sylvia Anthony  Procedure(s) Performed: COLONOSCOPY WITH PROPOFOL  Patient location during evaluation: PACU Anesthesia Type: General Level of consciousness: awake and alert Pain management: pain level controlled Vital Signs Assessment: post-procedure vital signs reviewed and stable Respiratory status: spontaneous breathing, nonlabored ventilation, respiratory function stable and patient connected to nasal cannula oxygen Cardiovascular status: blood pressure returned to baseline and stable Postop Assessment: no apparent nausea or vomiting Anesthetic complications: no   There were no known notable events for this encounter.   Last Vitals:  Vitals:   03/20/23 0850 03/20/23 1048  BP: 116/64 (!) 93/53  Pulse: 62 66  Resp: 14 15  Temp: 36.6 C 36.4 C  SpO2: 96% 98%    Last Pain:  Vitals:   03/20/23 1048  TempSrc: Oral  PainSc: 0-No pain                 [name]

## 2023-03-27 ENCOUNTER — Encounter (HOSPITAL_COMMUNITY): Payer: Self-pay | Admitting: Internal Medicine

## 2023-03-28 ENCOUNTER — Other Ambulatory Visit: Payer: Self-pay | Admitting: Internal Medicine

## 2023-03-28 DIAGNOSIS — Z1231 Encounter for screening mammogram for malignant neoplasm of breast: Secondary | ICD-10-CM

## 2023-03-28 NOTE — Progress Notes (Signed)
Remote pacemaker transmission.   

## 2023-03-29 DIAGNOSIS — I10 Essential (primary) hypertension: Secondary | ICD-10-CM | POA: Diagnosis not present

## 2023-03-31 ENCOUNTER — Ambulatory Visit
Admission: RE | Admit: 2023-03-31 | Discharge: 2023-03-31 | Disposition: A | Discharge status: Home / Self Care | Payer: Medicare Other | Source: Ambulatory Visit | Attending: Internal Medicine | Admitting: Internal Medicine

## 2023-03-31 DIAGNOSIS — Z1231 Encounter for screening mammogram for malignant neoplasm of breast: Secondary | ICD-10-CM

## 2023-04-28 DIAGNOSIS — I10 Essential (primary) hypertension: Secondary | ICD-10-CM | POA: Diagnosis not present

## 2023-05-08 ENCOUNTER — Telehealth: Payer: Self-pay | Admitting: Cardiology

## 2023-05-08 MED ORDER — CARVEDILOL 6.25 MG PO TABS: 6.2500 mg | ORAL_TABLET | Freq: Two times a day (BID) | ORAL | 0 refills | Status: DC

## 2023-05-08 NOTE — Telephone Encounter (Signed)
*  STAT* If patient is at the pharmacy, call can be transferred to refill team.   1. Which medications need to be refilled? (please list name of each medication and dose if known) carvedilol (COREG) 6.25 MG tablet    2. Would you like to learn more about the convenience, safety, & potential cost savings by using the [facility]?     3. Are you open to using the [facility] (Type [facility]. ).   4. Which pharmacy/location (including street and city if local pharmacy) is medication to be sent to? [facility] in [address], [place]   5. Do they need a 30 day or 90 day supply? 90 day   Patient is out of medication

## 2023-05-29 DIAGNOSIS — I10 Essential (primary) hypertension: Secondary | ICD-10-CM | POA: Diagnosis not present

## 2023-06-07 ENCOUNTER — Ambulatory Visit (INDEPENDENT_AMBULATORY_CARE_PROVIDER_SITE_OTHER): Payer: Medicare Other

## 2023-06-07 DIAGNOSIS — I495 Sick sinus syndrome: Secondary | ICD-10-CM

## 2023-06-07 LAB — CUP PACEART REMOTE DEVICE CHECK
Battery Remaining Longevity: 111 mo
Battery Voltage: 3.01 V
Brady Statistic AP VP Percent: 0.04 %
Brady Statistic AP VS Percent: 87.74 %
Brady Statistic AS VP Percent: 0 %
Brady Statistic AS VS Percent: 12.21 %
Brady Statistic RA Percent Paced: 88.22 %
Brady Statistic RV Percent Paced: 0.04 %
Date Time Interrogation Session: 20250107235204
Implantable Lead Connection Status: 753985
Implantable Lead Connection Status: 753985
Implantable Lead Implant Date: 19970416
Implantable Lead Implant Date: 19970416
Implantable Lead Location: 753859
Implantable Lead Location: 753860
Implantable Lead Model: 5034
Implantable Lead Model: 5534
Implantable Pulse Generator Implant Date: 20200113
Lead Channel Impedance Value: 627 Ohm
Lead Channel Impedance Value: 646 Ohm
Lead Channel Impedance Value: 646 Ohm
Lead Channel Impedance Value: 722 Ohm
Lead Channel Pacing Threshold Amplitude: 0.625 V
Lead Channel Pacing Threshold Amplitude: 0.875 V
Lead Channel Pacing Threshold Pulse Width: 0.4 ms
Lead Channel Pacing Threshold Pulse Width: 0.4 ms
Lead Channel Sensing Intrinsic Amplitude: 19.5 mV
Lead Channel Sensing Intrinsic Amplitude: 19.5 mV
Lead Channel Sensing Intrinsic Amplitude: 2 mV
Lead Channel Sensing Intrinsic Amplitude: 2 mV
Lead Channel Setting Pacing Amplitude: 1.5 V
Lead Channel Setting Pacing Amplitude: 2.5 V
Lead Channel Setting Pacing Pulse Width: 0.4 ms
Lead Channel Setting Sensing Sensitivity: 2 mV
Zone Setting Status: 755011
Zone Setting Status: 755011

## 2023-06-13 NOTE — Progress Notes (Signed)
[facility] - Clinic Note  06/19/2023    CHIEF COMPLAINT Patient presents for Retina Follow Up  HISTORY OF PRESENT ILLNESS: Sylvia Anthony is a 68 y.o. female who presents to the clinic today for:   HPI     Retina Follow Up   Patient presents with  Other.  In both eyes.  This started 6 months ago.  Duration of 6 months.  I, the attending physician,  performed the HPI with the patient and updated documentation appropriately.        Comments   Patient feels the vision is getting better. She states the right eye gets sore sometimes. She is using Brimonidine OU BID, Cosopt OU BID, and Prolensa OD BID.       Last edited by Rennis Chris, MD on 06/19/2023 12:33 PM.    Patient states she has had some soreness in the right eye, she has been using PF, which has helped, she does not use it regularly, no new health concerns, she is on diclofenac as needed for her arthritis  Referring physician: Ignatius Specking, MD [address] [place],  [place] [address]  HISTORICAL INFORMATION:   Selected notes from the MEDICAL RECORD NUMBER Referred by Dr. Earlene Plater for concern of bilateral vitritis.   CURRENT MEDICATIONS: Current Outpatient Medications (Ophthalmic Drugs)  Medication Sig   brimonidine (ALPHAGAN) 0.2 % ophthalmic solution Place 1 drop into both eyes 3 (three) times daily.   Bromfenac Sodium (PROLENSA) 0.07 % SOLN Place 1 drop into the left eye in the morning, at noon, in the evening, and at bedtime.   Bromfenac Sodium 0.07 % SOLN Place 1 drop into the left eye daily.   COSOPT 22.3-6.8 MG/ML ophthalmic solution Place 1 drop into both eyes 2 (two) times daily.   No current facility-administered medications for this visit. (Ophthalmic Drugs)   Current Outpatient Medications (Other)  Medication Sig   albuterol (PROVENTIL HFA;VENTOLIN HFA) 108 (90 BASE) MCG/ACT inhaler Inhale 2 puffs into the lungs every 6 (six) hours as needed for wheezing or shortness of breath.    amLODipine (NORVASC) 10 MG tablet Take 10 mg by mouth daily.   aspirin EC 81 MG tablet Take 1 tablet (81 mg total) by mouth daily.   carvedilol (COREG) 6.25 MG tablet Take 1 tablet (6.25 mg total) by mouth 2 (two) times daily.   cetirizine (ZYRTEC) 10 MG tablet Take 10 mg by mouth at bedtime as needed for allergies.    diclofenac (VOLTAREN) 75 MG EC tablet Take 75 mg by mouth daily as needed for mild pain.   ibuprofen (ADVIL) 200 MG tablet Take 400 mg by mouth every 8 (eight) hours as needed (pain.).   levothyroxine (SYNTHROID, LEVOTHROID) 112 MCG tablet Take 112 mcg by mouth daily before breakfast.    Niacin (VITAMIN B-3 PO) Take 1 tablet by mouth every 3 (three) days.   nitroGLYCERIN (NITROSTAT) 0.4 MG SL tablet Place 1 tablet (0.4 mg total) under the tongue every 5 (five) minutes as needed for chest pain. (Patient taking differently: Place 0.4 mg under the tongue every 5 (five) minutes x 3 doses as needed for chest pain (if no relief after 3rd dose, proceed to ED or call 911).)   pantoprazole (PROTONIX) 40 MG tablet Take 1 tablet (40 mg total) by mouth 2 (two) times daily before a meal.   potassium chloride SA (K-DUR,KLOR-CON) 20 MEQ tablet Take 20 mEq by mouth in the morning.   valsartan-hydrochlorothiazide (DIOVAN-HCT) 320-25 MG per  tablet Take 1 tablet by mouth in the morning.   No current facility-administered medications for this visit. (Other)   REVIEW OF SYSTEMS: ROS   Positive for: Musculoskeletal Last edited by Rennis Chris, MD on 06/19/2023 12:34 PM.      ALLERGIES Allergies  Allergen Reactions   Crestor [Rosuvastatin] Swelling and Other (See Comments)    Muscle aches/inflammation   Neurontin [Gabapentin] Swelling    Disoriented/"out of it"   PAST MEDICAL HISTORY Past Medical History:  Diagnosis Date   Anemia    Arm numbness    Cataract    Mixed form OU   Fibromyalgia    GERD (gastroesophageal reflux disease)    Glucose intolerance (impaired glucose tolerance)     H. pylori infection 01/2012   Amoxicillin, Biaxin   Hyperlipidemia    Hypertension    Hypertensive retinopathy    OU   Hypothyroidism    Mitral valve prolapse    not seen on recent echoes   Presence of permanent cardiac pacemaker    Rheumatic fever    age 49, was at [facility] for [facility] weeks   SSS (sick sinus syndrome) (HCC)    1997 ppm, gen change 2020 (MDT)   Past Surgical History:  Procedure Laterality Date   ABDOMINAL HYSTERECTOMY     partial   APPENDECTOMY     BALLOON DILATION N/A 10/18/2021   Procedure: BALLOON DILATION;  Surgeon: Lanelle Bal, DO;  [facility];  Service: Endoscopy;  Laterality: N/A;   BIOPSY  10/18/2021   Procedure: BIOPSY;  Surgeon: Lanelle Bal, DO;  [facility];  Service: Endoscopy;;   CARDIAC CATHETERIZATION     multiple   CHOLECYSTECTOMY     COLONOSCOPY  08/01/2006   3 mm descending colon polyp removed/8 mm sessile ascending colon polyp removed / 3-mm rectal  polyp removed /Rare sigmoid diverticulosis/ Moderate internal hemorrhoids.Advanced adenoma on colonoscopy in March 2008.  The polyp was anadenomatous polyp with a foci of high-grade dysplasia   COLONOSCOPY  09/08/2008   SIMPLE ADENOMA/HYPERPLASTIC POLY/Multiple colon polyps (ascending, sigmoid, rectal)  Mild sigmoid colon diverticulosis./ Small internal hemorrhoids   COLONOSCOPY N/A 12/11/2017   Dr. [name]: diverticulosis, hemorrhoids next surveillance tcs 5 years.    COLONOSCOPY WITH ESOPHAGOGASTRODUODENOSCOPY (EGD)  Sept. 30, 2013   ATF:TDDU diverticulosis was noted in the sigmoid colon/The colon was otherwise normal/Small internal hemorrhoids/EGD:The mucosa of the esophagus appeared normal/Non-erosive gastritis (inflammation) was found; multiple bx/The duodenal mucosa showed no abnormalities. +H.pylori gastritis, treated with equivalent of prevpac. SAVARY DILATION   COLONOSCOPY WITH PROPOFOL N/A 03/20/2023   Procedure: COLONOSCOPY WITH PROPOFOL;  Surgeon:  Lanelle Bal, DO;  [facility];  Service: Endoscopy;  Laterality: N/A;  10:30 am, asa 3   ESOPHAGEAL DILATION N/A 10/07/2014   Procedure: ESOPHAGEAL DILATION;  Surgeon: West Bali, MD;  [facility];  Service: Endoscopy;  Laterality: N/A;   ESOPHAGOGASTRODUODENOSCOPY N/A 10/07/2014   Dr. [name]: moderate non-erosive gastritis, no definite stricture, empiric dilation . negative H.pylori    ESOPHAGOGASTRODUODENOSCOPY (EGD) WITH PROPOFOL N/A 10/18/2021   Procedure: ESOPHAGOGASTRODUODENOSCOPY (EGD) WITH PROPOFOL;  Surgeon: Lanelle Bal, DO;  [facility];  Service: Endoscopy;  Laterality: N/A;  2:30pm   growth removed from intestine     [facility]-as teenager, done through colonoscopy   PACEMAKER GENERATOR CHANGE  09/18/2006   generator change by Dr Amil Amen with a MDT Adapta L   PACEMAKER INSERTION  09/13/1995   for sick  sinus syndome and syncope at [facility]   PPM GENERATOR CHANGEOUT N/A 06/11/2018    Medtronic Azure XT DR MRI SureScan model [id number] (serial number [id number]) pacemaker by Dr Johney Frame   right Achilles tendon     X 2   TONSILLECTOMY     FAMILY HISTORY Family History  Problem Relation Age of Onset   Hyperlipidemia Mother    Hypertension Mother    Heart disease Father    Hypertension Father    Hyperlipidemia Sister    Hypertension Sister    Colon polyps Sister    Hyperlipidemia Brother    Hypertension Brother    Colon polyps Brother    Colon cancer Neg Hx    SOCIAL HISTORY Social History   Tobacco Use   Smoking status: Former    Current packs/day: 0.00    Average packs/day: 0.3 packs/day for 4.0 years (1.0 ttl pk-yrs)    Types: Cigarettes    Start date: 10/09/1981    Quit date: 10/09/1985    Years since quitting: 37.7   Smokeless tobacco: Never   Tobacco comments:    just in teens  Vaping Use   Vaping status: Never Used  Substance Use Topics   Alcohol use: No    Alcohol/week: 0.0 standard drinks of alcohol    Drug use: No       OPHTHALMIC EXAM:  Base Eye Exam     Visual Acuity (Snellen - Linear)       Right Left   Dist [place] 20/25 20/25   Dist ph [place] NI NI         Tonometry (Tonopen, 9:22 AM)       Right Left   Pressure 12 11         Pupils       Dark Light Shape React APD   Right 3 2 Round Brisk None   Left 3 2 Round Brisk None         Visual Fields       Left Right    Full Full         Extraocular Movement       Right Left    Full, Ortho Full, Ortho         Neuro/Psych     Oriented x3: Yes   Mood/Affect: Normal         Dilation     Both eyes: 1.0% Mydriacyl, 2.5% Phenylephrine @ 9:19 AM           Slit Lamp and Fundus Exam     Slit Lamp Exam       Right Left   Lids/Lashes Dermatochalasis - upper lid Mild Dermatochalasis - upper lid, mild ptosis   Conjunctiva/Sclera Nasal Pinguecula, Melanosis Nasal Pinguecula, Melanosis   Cornea Mild Arcus, well healed cataract wound, 2+ inferior PEE, tear film debris Mild Arcus, well healed cataract wound, 1+ Punctate epithelial erosions, trace tear film debris   Anterior Chamber deep, clear, narrow temporal angle, no cell/flare Deep and clear, narrow temporal angle   Iris Round and dilated Round and dilated   Lens PC IOL in good position PC IOL in good position   Anterior Vitreous Vitreous syneresis, trace cell/pigment mild syneresis, trace cell/pigment         Fundus Exam       Right Left   Disc Pink and Sharp, central cupping and pallor, mild PPP Pink and Sharp   C/D Ratio 0.75 0.65   Macula Flat, good foveal reflex, mild  RPE mottling, no heme or edema Flat, good foveal reflex, central CME -- stably resolved, mild Retinal pigment epithelial mottling, no heme   Vessels attenuated, mild tortuosity attenuated, mild tortuosity   Periphery Attached, no heme, no snowbanking, peripheral pigmented cystoid degeneration inferiory Attached, no heme, no snowbanking, peripheral pigmented cystoid degeneration  inferiory           IMAGING AND PROCEDURES  Imaging and Procedures for @TODAY @  OCT, Retina - OU - Both Eyes       Right Eye Quality was good. Central Foveal Thickness: 249. Progression has been stable. Findings include normal foveal contour, no IRF, no SRF (No IRF/CME).   Left Eye Quality was good. Central Foveal Thickness: 249. Progression has been stable. Findings include normal foveal contour, no IRF, no SRF (Stable resolution of IRF/CME and SRF).   Notes *Images captured and stored on drive  Diagnosis / Impression:  OD: No IRF/CME OS: Stable resolution of IRF/CME and SRF  Clinical management:  See below  Abbreviations: NFP - Normal foveal profile. CME - cystoid macular edema. PED - pigment epithelial detachment. IRF - intraretinal fluid. SRF - subretinal fluid. EZ - ellipsoid zone. ERM - epiretinal membrane. ORA - outer retinal atrophy. ORT - outer retinal tubulation. 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 Cystoid macular edema of both eyes  H35.353     3. Uveitis  H20.9     4. Essential hypertension  I10     5. Hypertensive retinopathy of both eyes  H35.033     6. Pseudophakia, both eyes  Z96.1     7. Nodule of right lung  R91.1     8. Bilateral ocular hypertension  H40.053 OCT, Retina - OU - Both Eyes    9. Glaucoma suspect of both eyes  H40.003     10. Ptosis of left eyelid  H02.402      1-3. Mild Panuveitis w/ CME OU  - s/p STK OD #1 (05.25.21).  - s/p STK OS #1 (10.10.23) - episode of recurrent CME OS likely related to cataract sx (April/May 2023); OD without CME  - delayed to follow up from 10.11.21 to 11.29.22 - at initial presentation, pt reported 1+ mo history of floaters and decreased vision OU; +photophobia  - saw Dr. Earlene Plater, who noted [facility] cell and started PF q1h - initial exam here showed +cell/pigment in [place] and vitreous cavity; mild vitreous  haze and +central CME OU - FA (02.15.21) showed central petaloid hyperfluorescence and hyperfluorescence of the optic disc  - pt reports history of fibromyalgia, family history of lupus  - initiated uveitis lab work up -- all WNL except +toxo IgG   CBC, CMP   RPR, VDRL, FTA-Abs, MHA   HIV, Lyme, Quant-Gold   Toxoplasma titers   HLA Panel   ANA   ANCA   ACE, Lysozyme   RF   ESR, CRP   CXR - chest x-ray without TB or pulmonary sarcoidosis, but did reveal a 1.6cm nodular mass in right lung base  - hx of Covid-19 infection ?involvement - repeat FA (08.23.21) showed interval improvement in perifoveal petaloid leakage OU  - OCT shows stable resolution of IRF/CME and SRF  - discussed findings  - off PredForte due to history of steroid response  - off Prolensa qdaily OS -- okay to continue holding  -  f/u 6-9 months, sooner prn -- DFE/OCT  4,5. Hypertensive retinopathy OU  - discussed importance of tight BP control  - continue to monitor  6. Pseudophakia OU  - s/p CE/IOL OU (Dr. Zenaida Niece, April and May 2023)  - IOLs in good position, doing well  - continue to monitor  7. 1.6 cm lung nodule of R lung base found on CXR - following with Pulmonology -- lesion shrinking and no interventions have been recommended - PET imaging done on 3.11.21 -- identifying some metabolic lesions (see report below) - CT chest performed 4.14.21 -- indicating mild dec in some dimensions (see report below) - pt saw Dr. Delton Coombes who had originally scheduled a bronchoscopy, but was cancelled following the CT evidence of decreasing size on CT  - nodule continues to decrease in size on imaging  8,9. 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 Zenaida Niece  - IOP 12, 11 -- suspect steroid response -- discontinued PF as above  - +family history  - C/D increased OU -- OD 0.75; OS 0.65 - reviewed instructions: Cosopt BID OU and Brimonidine BID OU  - s/p SLT OS (03.25.24 -- Dr. Zenaida Niece)   10.  Ptosis OS  - under the expert management of Dr. Zenaida Niece  - referred for surgical consult to [facility]  Ophthalmic Meds Ordered this visit:  No orders of the defined types were placed in this encounter.  This document serves as a record of services personally performed by Karie Chimera, MD, PhD. It was created on their behalf by Glee Arvin. Manson Passey, OA an ophthalmic technician. The creation of this record is the provider's dictation and/or activities during the visit.    Electronically signed by: Glee Arvin. Manson Passey, OA 06/19/23 12:34 PM   Karie Chimera, M.D., Ph.D. Diseases & Surgery of the Retina and Vitreous [facility]  I have reviewed the above documentation for accuracy and completeness, and I agree with the above. Karie Chimera, M.D., Ph.D. 06/19/23 12:35 PM   Abbreviations: M myopia (nearsighted); A astigmatism; H hyperopia (farsighted); P presbyopia; Mrx spectacle prescription;  CTL contact lenses; OD right eye; OS left eye; OU both eyes  XT exotropia; ET esotropia; PEK punctate epithelial keratitis; PEE punctate epithelial erosions; DES dry eye syndrome; MGD meibomian gland dysfunction; ATs artificial tears; PFAT's preservative free artificial tears; NSC nuclear sclerotic cataract; PSC posterior subcapsular cataract; ERM epi-retinal membrane; PVD posterior vitreous detachment; RD retinal detachment; DM diabetes mellitus; DR diabetic retinopathy; NPDR non-proliferative diabetic retinopathy; PDR proliferative diabetic retinopathy; CSME clinically significant macular edema; DME diabetic macular edema; dbh dot blot hemorrhages; CWS cotton wool spot; POAG primary open angle glaucoma; C/D cup-to-disc ratio; HVF humphrey visual field; GVF goldmann visual field; OCT optical coherence tomography; IOP intraocular pressure; BRVO Branch retinal vein occlusion; CRVO central retinal vein occlusion; CRAO central retinal artery occlusion; BRAO branch retinal artery occlusion; RT  retinal tear; SB scleral buckle; PPV pars plana vitrectomy; VH Vitreous hemorrhage; PRP panretinal laser photocoagulation; IVK intravitreal kenalog; VMT vitreomacular traction; MH Macular hole;  NVD neovascularization of the disc; NVE neovascularization elsewhere; AREDS age related eye disease study; ARMD age related macular degeneration; POAG primary open angle glaucoma; EBMD epithelial/anterior basement membrane dystrophy; ACIOL anterior chamber intraocular lens; IOL intraocular lens; PCIOL posterior chamber intraocular lens; Phaco/IOL phacoemulsification with intraocular lens placement; PRK photorefractive keratectomy; LASIK laser assisted in situ keratomileusis; HTN hypertension; DM diabetes mellitus; COPD chronic obstructive pulmonary disease

## 2023-06-19 ENCOUNTER — Encounter (INDEPENDENT_AMBULATORY_CARE_PROVIDER_SITE_OTHER): Payer: Self-pay | Admitting: Ophthalmology

## 2023-06-19 ENCOUNTER — Ambulatory Visit (INDEPENDENT_AMBULATORY_CARE_PROVIDER_SITE_OTHER): Payer: Medicare Other | Admitting: Ophthalmology

## 2023-06-19 DIAGNOSIS — Z961 Presence of intraocular lens: Secondary | ICD-10-CM | POA: Diagnosis not present

## 2023-06-19 DIAGNOSIS — H35033 Hypertensive retinopathy, bilateral: Secondary | ICD-10-CM | POA: Diagnosis not present

## 2023-06-19 DIAGNOSIS — H44113 Panuveitis, bilateral: Secondary | ICD-10-CM

## 2023-06-19 DIAGNOSIS — I10 Essential (primary) hypertension: Secondary | ICD-10-CM | POA: Diagnosis not present

## 2023-06-19 DIAGNOSIS — H40053 Ocular hypertension, bilateral: Secondary | ICD-10-CM | POA: Diagnosis not present

## 2023-06-19 DIAGNOSIS — H40003 Preglaucoma, unspecified, bilateral: Secondary | ICD-10-CM

## 2023-06-19 DIAGNOSIS — H02402 Unspecified ptosis of left eyelid: Secondary | ICD-10-CM | POA: Diagnosis not present

## 2023-06-19 DIAGNOSIS — H209 Unspecified iridocyclitis: Secondary | ICD-10-CM | POA: Diagnosis not present

## 2023-06-19 DIAGNOSIS — H35353 Cystoid macular degeneration, bilateral: Secondary | ICD-10-CM | POA: Diagnosis not present

## 2023-06-19 DIAGNOSIS — R911 Solitary pulmonary nodule: Secondary | ICD-10-CM | POA: Diagnosis not present

## 2023-06-28 DIAGNOSIS — I10 Essential (primary) hypertension: Secondary | ICD-10-CM | POA: Diagnosis not present

## 2023-07-10 DIAGNOSIS — J069 Acute upper respiratory infection, unspecified: Secondary | ICD-10-CM | POA: Diagnosis not present

## 2023-07-10 DIAGNOSIS — J320 Chronic maxillary sinusitis: Secondary | ICD-10-CM | POA: Diagnosis not present

## 2023-07-10 DIAGNOSIS — Z299 Encounter for prophylactic measures, unspecified: Secondary | ICD-10-CM | POA: Diagnosis not present

## 2023-07-16 NOTE — Progress Notes (Unsigned)
 Cardiology Office Note    Patient Name: Sylvia Anthony Date of Encounter: 07/17/2023  Primary Care Provider:  Ignatius Specking, MD Primary Cardiologist:  Donato Schultz, MD Primary Electrophysiologist: Maurice Small, MD   Past Medical History    Past Medical History:  Diagnosis Date   Anemia    Arm numbness    Cataract    Mixed form OU   Fibromyalgia    GERD (gastroesophageal reflux disease)    Glucose intolerance (impaired glucose tolerance)    H. pylori infection 01/2012   Amoxicillin, Biaxin   Hyperlipidemia    Hypertension    Hypertensive retinopathy    OU   Hypothyroidism    Mitral valve prolapse    not seen on recent echoes   Presence of permanent cardiac pacemaker    Rheumatic fever    age 17, was at [facility] for 17 weeks   SSS (sick sinus syndrome) (HCC)    1997 ppm, gen change 2020 (MDT)    History of Present Illness  Sylvia Anthony is a 68 y.o. female with a PMH of sick sinus syndrome s/p PPM placed 1997, hypothyroidism, aortic atherosclerosis right lung nodule, HTN, HLD, GERD, rheumatic fever at age 12,  who presents today for 1 year follow-up.  Ms. Trapani has a cardiac history dating back to 1997 when she developed sick sinus syndrome and syncope and had PPM placed at [facility].  She was previously followed by Dr. Katrinka Blazing and was initially established with Dr. Anne Fu on 07/11/2022.  She underwent previous LHC in 2002 that showed normal coronaries.  She underwent 2D echo in 10/2020 for lower extremity swelling that showed EF of 65 to 70% with grade 1 DD, normal RV function, trivial MR.  She was seen in follow-up on 10/2020 and noted lower extremity edema and had amlodipine dose reduced to 5 mg with improvement.  She was last seen in our office on 07/11/2022 and reported doing well overall with no new cardiac complaints.  Her blood pressure was well-controlled and cholesterol was managed with niacin and D3 supplement.  She completed a lower  extremity duplex on 11/14/2022 that showed no evidence of lower extremity DVT. Pain she is currently followed by Dr. Nelly Laurence for management of PPM with most recent Paceart reports showing normal function.  Ms. Hitt presents today for 1 year follow-up.  She reports since her previous visit that she has been doing well with no new cardiac complaints.  Her blood pressure today is well-controlled at 118/74 and heart 64 bpm.  She reports no adverse reactions with her current medication regimen.. The patient reports a history of leg swelling, which has improved. The patient is active, walking daily and using exercise machines every other day. The patient reports a family history of strokes. The patient has tried multiple cholesterol medications in the past, including Lipitor, Crestor,  but experienced significant side effects, including dry mouth and muscle aches. The patient is currently not on any cholesterol medication but is taking niacin and D3.  Review of Systems  Please see the history of present illness.    All other systems reviewed and are otherwise negative except as noted above.  Physical Exam    Wt Readings from Last 3 Encounters:  07/17/23 197 lb 3.2 oz (89.4 kg)  03/20/23 192 lb 7.4 oz (87.3 kg)  03/14/23 192 lb 8 oz (87.3 kg)   VS: Vitals:   07/17/23 0756  BP: 118/74  Pulse: 64  SpO2:  93%  ,Body mass index is 33.33 kg/m. GEN: Well nourished, well developed in no acute distress Neck: No JVD; No carotid bruits Pulmonary: Clear to auscultation without rales, wheezing or rhonchi  Cardiovascular: Normal rate. Regular rhythm. Normal S1. Normal S2.   Murmurs: There is no murmur.  ABDOMEN: Soft, non-tender, non-distended EXTREMITIES:  No edema; No deformity   EKG/LABS/ Recent Cardiac Studies   ECG personally reviewed by me today -atrial paced with rate of 64 bpm and RBBB with no acute changes consistent with previous EKG.  Risk Assessment/Calculations:          Lab Results   Component Value Date   WBC 6.6 10/12/2021   HGB 14.3 10/12/2021   HCT 44.7 10/12/2021   MCV 86.0 10/12/2021   PLT 265 10/12/2021   Lab Results  Component Value Date   CREATININE 0.82 03/14/2023   BUN 12 03/14/2023   NA 140 03/14/2023   K 3.5 03/14/2023   CL 104 03/14/2023   CO2 26 03/14/2023   No results found for: "CHOL", "HDL", "LDLCALC", "LDLDIRECT", "TRIG", "CHOLHDL"  No results found for: "HGBA1C" Assessment & Plan    1.  Primary hypertension: -Patient's blood pressure today was well-controlled at 118/74 -Continue amlodipine 10 mg daily, carvedilol 6.25 mg twice daily, Diovan-HCTZ 320-25 mg  2.  Hyperlipidemia: -Patient's last LDL cholesterol was elevated at 188 -She has previously been intolerant to 2 different statin medications in the past. -Refer to Lipid Clinic for evaluation and discussion of non-statin options, including PCSK9 inhibitors.  3.  History of sick sinus syndrome: -s/p dual-chamber Medtronic PPM placed 1997 -Most recent Paceart report shows normal lead and battery function  4.  Obesity: -Patient's BMI is 33.33 kg/m -Patient is working on lifestyle modification and is currently exercising.  150 minutes/week  Disposition: Follow-up with Donato Schultz, MD or APP in 12 months    Signed, Napoleon Form, Leodis Rains, NP 07/17/2023, 8:12 AM [facility]

## 2023-07-17 ENCOUNTER — Ambulatory Visit: Payer: Medicare Other | Attending: Nurse Practitioner | Admitting: Nurse Practitioner

## 2023-07-17 ENCOUNTER — Encounter: Payer: Self-pay | Admitting: Nurse Practitioner

## 2023-07-17 VITALS — BP 118/74 | HR 64 | Ht 64.5 in | Wt 197.2 lb

## 2023-07-17 DIAGNOSIS — I10 Essential (primary) hypertension: Secondary | ICD-10-CM | POA: Diagnosis not present

## 2023-07-17 DIAGNOSIS — I495 Sick sinus syndrome: Secondary | ICD-10-CM | POA: Diagnosis not present

## 2023-07-17 DIAGNOSIS — E66811 Obesity, class 1: Secondary | ICD-10-CM

## 2023-07-17 DIAGNOSIS — E785 Hyperlipidemia, unspecified: Secondary | ICD-10-CM | POA: Diagnosis not present

## 2023-07-17 NOTE — Patient Instructions (Addendum)
 Medication Instructions:   Your physician recommends that you continue on your current medications as directed. Please refer to the Current Medication list given to you today.   *If you need a refill on your cardiac medications before your next appointment, please call your pharmacy*   Lab Work:  None ordered.  If you have labs (blood work) drawn today and your tests are completely normal, you will receive your results only by: MyChart Message (if you have MyChart) OR A paper copy in the mail If you have any lab test that is abnormal or we need to change your treatment, we will call you to review the results.   Testing/Procedures:  None ordered.   Follow-Up: At [facility], you and your health needs are our priority.  As part of our continuing mission to provide you with exceptional heart care, we have created designated Provider Care Teams.  These Care Teams include your primary Cardiologist (physician) and Advanced Practice Providers (APPs -  Physician Assistants and Nurse Practitioners) who all work together to provide you with the care you need, when you need it.  We recommend signing up for the patient portal called "MyChart".  Sign up information is provided on this After Visit Summary.  MyChart is used to connect with patients for Virtual Visits (Telemedicine).  Patients are able to view lab/test results, encounter notes, upcoming appointments, etc.  Non-urgent messages can be sent to your provider as well.   To learn more about what you can do with MyChart, go to [email or web address].    Your next appointment:   1 year(s)  Provider:   Donato Schultz, MD     Other Instructions . Your physician wants you to follow-up in: 1 year.  You will receive a reminder letter in the mail two months in advance. If you don't receive a letter, please call our office to schedule the follow-up appointment.  You have been referred to to the lipid clinic. Tomorrow, February 18 @  1:30 pm.     1st Floor: - Lobby - Registration  - Pharmacy  - Lab - Cafe  2nd Floor: - PV Lab - Diagnostic Testing (echo, CT, nuclear med)  3rd Floor: - Vacant  4th Floor: - TCTS (cardiothoracic surgery) - AFib Clinic - Structural Heart Clinic - Vascular Surgery  - Vascular Ultrasound  5th Floor: - HeartCare Cardiology (general and EP) - Clinical Pharmacy for coumadin, hypertension, lipid, weight-loss medications, and med management appointments    Valet parking services will be available as well.    DASH Eating Plan DASH stands for Dietary Approaches to Stop Hypertension. The DASH eating plan is a healthy eating plan that has been shown to: Lower high blood pressure (hypertension). Reduce your risk for type 2 diabetes, heart disease, and stroke. Help with weight loss. What are tips for following this plan? Reading food labels Check food labels for the amount of salt (sodium) per serving. Choose foods with less than 5 percent of the Daily Value (DV) of sodium. In general, foods with less than 300 milligrams (mg) of sodium per serving fit into this eating plan. To find whole grains, look for the word "whole" as the first word in the ingredient list. Shopping Buy products labeled as "low-sodium" or "no salt added." Buy fresh foods. Avoid canned foods and pre-made or frozen meals. Cooking Try not to add salt when you cook. Use salt-free seasonings or herbs instead of table salt or sea salt. Check with your health care provider or  pharmacist before using salt substitutes. Do not fry foods. Cook foods in healthy ways, such as baking, boiling, grilling, roasting, or broiling. Cook using oils that are good for your heart. These include olive, canola, avocado, soybean, and sunflower oil. Meal planning  Eat a balanced diet. This should include: 4 or more servings of fruits and 4 or more servings of vegetables each day. Try to fill half of your plate with fruits and  vegetables. 6-8 servings of whole grains each day. 6 or less servings of lean meat, poultry, or fish each day. 1 oz is 1 serving. A 3 oz (85 g) serving of meat is about the same size as the palm of your hand. One egg is 1 oz (28 g). 2-3 servings of low-fat dairy each day. One serving is 1 cup (237 mL). 1 serving of nuts, seeds, or beans 5 times each week. 2-3 servings of heart-healthy fats. Healthy fats called omega-3 fatty acids are found in foods such as walnuts, flaxseeds, fortified milks, and eggs. These fats are also found in cold-water fish, such as sardines, salmon, and mackerel. Limit how much you eat of: Canned or prepackaged foods. Food that is high in trans fat, such as fried foods. Food that is high in saturated fat, such as fatty meat. Desserts and other sweets, sugary drinks, and other foods with added sugar. Full-fat dairy products. Do not salt foods before eating. Do not eat more than 4 egg yolks a week. Try to eat at least 2 vegetarian meals a week. Eat more home-cooked food and less restaurant, buffet, and fast food. Lifestyle When eating at a restaurant, ask if your food can be made with less salt or no salt. If you drink alcohol: Limit how much you have to: 0-1 drink a day if you are female. 0-2 drinks a day if you are female. Know how much alcohol is in your drink. In the [place] one drink is one 12 oz bottle of beer (355 mL), one 5 oz glass of wine (148 mL), or one 1 oz glass of hard liquor (44 mL). General information Avoid eating more than 2,300 mg of salt a day. If you have hypertension, you may need to reduce your sodium intake to 1,500 mg a day. Work with your provider to stay at a healthy body weight or lose weight. Ask what the best weight range is for you. On most days of the week, get at least 30 minutes of exercise that causes your heart to beat faster. This may include walking, swimming, or biking. Work with your provider or dietitian to adjust your eating  plan to meet your specific calorie needs. What foods should I eat? Fruits All fresh, dried, or frozen fruit. Canned fruits that are in their natural juice and do not have sugar added to them. Vegetables Fresh or frozen vegetables that are raw, steamed, roasted, or grilled. Low-sodium or reduced-sodium tomato and vegetable juice. Low-sodium or reduced-sodium tomato sauce and tomato paste. Low-sodium or reduced-sodium canned vegetables. Grains Whole-grain or whole-wheat bread. Whole-grain or whole-wheat pasta. Brown rice. Orpah Cobb. Bulgur. Whole-grain and low-sodium cereals. Pita bread. Low-fat, low-sodium crackers. Whole-wheat flour tortillas. Meats and other proteins Skinless chicken or [place]. Ground chicken or [place]. Pork with fat trimmed off. Fish and seafood. Egg whites. Dried beans, peas, or lentils. Unsalted nuts, nut butters, and seeds. Unsalted canned beans. Lean cuts of beef with fat trimmed off. Low-sodium, lean precooked or cured meat, such as sausages or meat loaves. Dairy Low-fat (1%)  or fat-free (skim) milk. Reduced-fat, low-fat, or fat-free cheeses. Nonfat, low-sodium ricotta or cottage cheese. Low-fat or nonfat yogurt. Low-fat, low-sodium cheese. Fats and oils Soft margarine without trans fats. Vegetable oil. Reduced-fat, low-fat, or light mayonnaise and salad dressings (reduced-sodium). Canola, safflower, olive, avocado, soybean, and sunflower oils. Avocado. Seasonings and condiments Herbs. Spices. Seasoning mixes without salt. Other foods Unsalted popcorn and pretzels. Fat-free sweets. The items listed above may not be all the foods and drinks you can have. Talk to a dietitian to learn more. What foods should I avoid? Fruits Canned fruit in a light or heavy syrup. Fried fruit. Fruit in cream or butter sauce. Vegetables Creamed or fried vegetables. Vegetables in a cheese sauce. Regular canned vegetables that are not marked as low-sodium or reduced-sodium. Regular  canned tomato sauce and paste that are not marked as low-sodium or reduced-sodium. Regular tomato and vegetable juices that are not marked as low-sodium or reduced-sodium. Rosita Fire. Olives. Grains Baked goods made with fat, such as croissants, muffins, or some breads. Dry pasta or rice meal packs. Meats and other proteins Fatty cuts of meat. Ribs. Fried meat. Tomasa Blase. Bologna, salami, and other precooked or cured meats, such as sausages or meat loaves, that are not lean and low in sodium. Fat from the back of a pig (fatback). Bratwurst. Salted nuts and seeds. Canned beans with added salt. Canned or smoked fish. Whole eggs or egg yolks. Chicken or [place] with skin. Dairy Whole or 2% milk, cream, and half-and-half. Whole or full-fat cream cheese. Whole-fat or sweetened yogurt. Full-fat cheese. Nondairy creamers. Whipped toppings. Processed cheese and cheese spreads. Fats and oils Butter. Stick margarine. Lard. Shortening. Ghee. Bacon fat. Tropical oils, such as coconut, palm kernel, or palm oil. Seasonings and condiments Onion salt, garlic salt, seasoned salt, table salt, and sea salt. Worcestershire sauce. Tartar sauce. Barbecue sauce. Teriyaki sauce. Soy sauce, including reduced-sodium soy sauce. Steak sauce. Canned and packaged gravies. Fish sauce. Oyster sauce. Cocktail sauce. Store-bought horseradish. Ketchup. Mustard. Meat flavorings and tenderizers. Bouillon cubes. Hot sauces. Pre-made or packaged marinades. Pre-made or packaged taco seasonings. Relishes. Regular salad dressings. Other foods Salted popcorn and pretzels. The items listed above may not be all the foods and drinks you should avoid. Talk to a dietitian to learn more. Where to find more information National Heart, Lung, and Blood Institute (NHLBI): BuffaloDryCleaner.gl American Heart Association (AHA): heart.org Academy of Nutrition and Dietetics: eatright.org National Kidney Foundation (NKF): kidney.org This information is not intended to  replace advice given to you by your health care provider. Make sure you discuss any questions you have with your health care provider. Document Revised: 06/02/2022 Document Reviewed: 06/02/2022 Elsevier Patient Education  2024 Elsevier Inc.  Adopting a Healthy Lifestyle.   Weight: Know what a healthy weight is for you (roughly BMI <25) and aim to maintain this. You can calculate your body mass index on your smart phone. Unfortunately, this is not the most accurate measure of healthy weight, but it is the simplest measurement to use. A more accurate measurement involves body scanning which measures lean muscle, fat tissue and bony density. We do not have this equipment at [facility].    Diet: Aim for 7+ servings of fruits and vegetables daily Limit animal fats in diet for cholesterol and heart health - choose grass fed whenever available Avoid highly processed foods (fast food burgers, tacos, fried chicken, pizza, hot dogs, french fries)  Saturated fat comes in the form of butter, lard, coconut oil, margarine, partially hydrogenated oils, and  fat in meat. These increase your risk of cardiovascular disease.  Use healthy plant oils, such as olive, canola, soy, corn, sunflower and peanut.  Whole foods such as fruits, vegetables and whole grains have fiber  Men need > 38 grams of fiber per day Women need > 25 grams of fiber per day  Load up on vegetables and fruits - one-half of your plate: Aim for color and variety, and remember that potatoes dont count. Go for whole grains - one-quarter of your plate: Whole wheat, barley, wheat berries, quinoa, oats, brown rice, and foods made with them. If you want pasta, go with whole wheat pasta. Protein power - one-quarter of your plate: Fish, chicken, beans, and nuts are all healthy, versatile protein sources. Limit red meat. You need carbohydrates for energy! The type of carbohydrate is more important than the amount. Choose carbohydrates such as vegetables,  fruits, whole grains, beans, and nuts in the place of white rice, white pasta, potatoes (baked or fried), macaroni and cheese, cakes, cookies, and donuts.  If youre thirsty, drink water. Coffee and tea are good in moderation, but skip sugary drinks and limit milk and dairy products to one or two daily servings. Keep sugar intake at 6 teaspoons or 24 grams or LESS       Exercise: Aim for 150 min of moderate intensity exercise weekly for heart health, and weights twice weekly for bone health Stay active - any steps are better than no steps! Aim for 7-9 hours of sleep daily

## 2023-07-18 ENCOUNTER — Telehealth: Payer: Self-pay | Admitting: Pharmacy Technician

## 2023-07-18 ENCOUNTER — Ambulatory Visit: Payer: Medicare Other | Attending: Cardiology | Admitting: Pharmacist

## 2023-07-18 ENCOUNTER — Other Ambulatory Visit (HOSPITAL_COMMUNITY): Payer: Self-pay

## 2023-07-18 DIAGNOSIS — E785 Hyperlipidemia, unspecified: Secondary | ICD-10-CM | POA: Insufficient documentation

## 2023-07-18 MED ORDER — NITROGLYCERIN 0.4 MG SL SUBL: 0.4000 mg | SUBLINGUAL_TABLET | SUBLINGUAL | 3 refills | Status: AC | PRN

## 2023-07-18 NOTE — Progress Notes (Signed)
 Patient ID: [name]                 DOB: [birth year]                    MRN: [id number]      HPI: Sylvia Anthony is a 68 y.o. female patient referred to lipid clinic by Sylvia Searing, NP. PMH is significant for  sick sinus syndrome s/p PPM placed 1997, hypothyroidism, aortic atherosclerosis right lung nodule, HTN, HLD, GERD, rheumatic fever at age 61.   Patient with a history of intolerance to atorvastatin and rosuvastatin.  Family history of strokes.  Currently on niacin.  Last LDL-C 188.  LDL-C in 2022 was 238.  Patient presents today to lipid clinic.  She has been intolerant to several different statins.  They caused muscle cramps dry mouth and fatigue.  Reviewed options for lowering LDL cholesterol, including ezetimibe, PCSK-9 inhibitors, bempedoic acid and inclisiran.  Discussed mechanisms of action, dosing, side effects and potential decreases in LDL cholesterol.  Also reviewed cost information.  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 Uses it some. Walks often when its warn, golfs  every monday  Family History:  Family History  Problem Relation Age of Onset   Hyperlipidemia Mother    Hypertension Mother    Heart disease Father    Hypertension Father    Hyperlipidemia Sister    Hypertension Sister    Colon polyps Sister    Hyperlipidemia Brother    Hypertension Brother    Colon  polyps Brother    Colon cancer Neg Hx     Social History: no tobacco, no ETOH  Labs: Lipid Panel   09/27/23 TC 264, HDL 53 TG 128 LDL-C 188 No results found for: "CHOL", "TRIG", "HDL", "CHOLHDL", "VLDL", "LDLCALC", "LDLDIRECT", "LABVLDL"  Past Medical History:  Diagnosis Date   Anemia    Arm numbness    Cataract    Mixed form OU   Fibromyalgia    GERD (gastroesophageal reflux disease)    Glucose intolerance (impaired glucose tolerance)    H. pylori infection 01/2012   Amoxicillin, Biaxin   Hyperlipidemia    Hypertension    Hypertensive retinopathy    OU   Hypothyroidism    Mitral valve prolapse    not seen on recent echoes   Presence of permanent cardiac pacemaker    Rheumatic fever    age 73, was at [facility] for 17 weeks   SSS (sick sinus syndrome) (HCC)    1997 ppm, gen change 2020 (MDT)    Current Outpatient Medications on File Prior to Visit  Medication Sig Dispense Refill   amLODipine (NORVASC) 10 MG tablet Take 10 mg by mouth daily.     aspirin EC 81 MG tablet Take 1 tablet (81 mg total) by mouth daily. 90 tablet 3   brimonidine (ALPHAGAN) 0.2 % ophthalmic solution  Place 1 drop into both eyes 2 (two) times daily.     carvedilol (COREG) 6.25 MG tablet Take 1 tablet (6.25 mg total) by mouth 2 (two) times daily. 180 tablet 0   cetirizine (ZYRTEC) 10 MG tablet Take 10 mg by mouth at bedtime as needed for allergies.      COSOPT 22.3-6.8 MG/ML ophthalmic solution Place 1 drop into both eyes 2 (two) times daily.     diclofenac (VOLTAREN) 75 MG EC tablet Take 75 mg by mouth daily as needed for mild pain.     ibuprofen (ADVIL) 200 MG tablet Take 400 mg by mouth every 8 (eight) hours as needed (pain.).     levothyroxine (SYNTHROID, LEVOTHROID) 112 MCG tablet Take 112 mcg by mouth daily before breakfast.      Niacin (VITAMIN B-3 PO) Take 1 tablet by mouth every 3 (three) days.     pantoprazole (PROTONIX) 40 MG tablet Take 1 tablet (40 mg total) by mouth 2 (two) times  daily before a meal. 60 tablet 5   potassium chloride SA (K-DUR,KLOR-CON) 20 MEQ tablet Take 20 mEq by mouth in the morning.     valsartan-hydrochlorothiazide (DIOVAN-HCT) 320-25 MG per tablet Take 1 tablet by mouth in the morning.     albuterol (PROVENTIL HFA;VENTOLIN HFA) 108 (90 BASE) MCG/ACT inhaler Inhale 2 puffs into the lungs every 6 (six) hours as needed for wheezing or shortness of breath.     No current facility-administered medications on file prior to visit.    Allergies  Allergen Reactions   Crestor [Rosuvastatin] Swelling and Other (See Comments)    Muscle aches/inflammation   Neurontin [Gabapentin] Swelling    Disoriented/"out of it"    Assessment/Plan:  1. Hyperlipidemia -  Hyperlipidemia Assessment: LDL-C is above goal of less than 70 Given her baseline of 238 most likely this is familiar hyperlipidemia Intolerant to 4 different statins including rosuvastatin 5 mg once a week Discussed diet including increasing fiber and decreasing saturated fat and sugars She does have an active job that she is on her feet a lot.  Previously did a lot more exercise but not as much recently Discussed medication options including PCSK9 and Nexlizet Injection technique, side effects and cost of Repatha reviewed Patient seemed hesitant about injections citing a bad experience with them.  But seems willing to try.  Plan: Will submit prior authorization for Repatha.  Labs in 3 months    Thank you,  Sylvia Anthony, Pharm.D, BCACP, CPP [facility] A Division of Sylvia Anthony [address] [place], [place] [address]  Phone: [phone]; Fax: [phone]

## 2023-07-18 NOTE — Assessment & Plan Note (Signed)
 Assessment: LDL-C is above goal of less than 70 Given her baseline of 238 most likely this is familiar hyperlipidemia Intolerant to 4 different statins including rosuvastatin 5 mg once a week Discussed diet including increasing fiber and decreasing saturated fat and sugars She does have an active job that she is on her feet a lot.  Previously did a lot more exercise but not as much recently Discussed medication options including PCSK9 and Nexlizet Injection technique, side effects and cost of Repatha reviewed Patient seemed hesitant about injections citing a bad experience with them.  But seems willing to try.  Plan: Will submit prior authorization for Repatha.  Labs in 3 months

## 2023-07-18 NOTE — Telephone Encounter (Signed)
 Pharmacy Patient Advocate Encounter   Received notification from  cc'd charts  that prior authorization for repatha is required/requested.   Insurance verification completed.   The patient is insured through [employer] .   Per test claim: PA required; PA submitted to above mentioned insurance via CoverMyMeds Key/confirmation #/EOC [id number] Status is pending

## 2023-07-18 NOTE — Patient Instructions (Signed)

## 2023-07-19 ENCOUNTER — Telehealth: Payer: Self-pay | Admitting: Pharmacy Technician

## 2023-07-19 ENCOUNTER — Other Ambulatory Visit: Payer: Self-pay

## 2023-07-19 ENCOUNTER — Other Ambulatory Visit (HOSPITAL_COMMUNITY): Payer: Self-pay

## 2023-07-19 MED ORDER — REPATHA SURECLICK 140 MG/ML ~~LOC~~ SOAJ
1.0000 mL | SUBCUTANEOUS | 11 refills | Status: DC
  Filled 2023-07-19: qty 2, 28d supply, fill #0
  Filled 2023-08-14: qty 6, 84d supply, fill #1
  Filled 2023-11-02: qty 6, 84d supply, fill #2
  Filled 2024-01-25 – 2024-01-27 (×3): qty 6, 84d supply, fill #3

## 2023-07-19 NOTE — Addendum Note (Signed)
 Addended by: Malena Peer D on: 07/19/2023 10:17 AM   Modules accepted: Orders

## 2023-07-19 NOTE — Telephone Encounter (Signed)
 Faxed [phone] diag grant verification form to [employer] 11:02am

## 2023-07-19 NOTE — Telephone Encounter (Signed)
 Spoke with patient. Will use [facility] and have it mailed to her. Will need [employer] grant

## 2023-07-19 NOTE — Telephone Encounter (Signed)
 Called and LVM for patient to call back She has a $255 deductible Next fill would be 47. If this is too much can look into [employer] grant

## 2023-07-19 NOTE — Addendum Note (Signed)
 Addended by: Elease Etienne A on: 07/19/2023 10:05 AM   Modules accepted: Orders

## 2023-07-19 NOTE — Progress Notes (Signed)
 Remote pacemaker transmission.

## 2023-07-19 NOTE — Telephone Encounter (Signed)
 Pharmacy Patient Advocate Encounter  Received notification from [facility] that Prior Authorization for repatha has been APPROVED from 07/18/23 to 01/15/24. Ran test claim, Copay is $302.00. This test claim was processed through [facility]- copay amounts may vary at other pharmacies due to pharmacy/plan contracts, or as the patient moves through the different stages of their insurance plan.   PA #/Case ID/Reference #: [id number]

## 2023-07-19 NOTE — Telephone Encounter (Signed)
 Patient Advocate Encounter   The patient was approved for a [employer] grant that will help cover the cost of repatha Total amount awarded, 2500.00.  Effective: 06/19/23 - 06/17/24   [id number] XLK:[id number]   Pharmacy provided with approval and processing information.

## 2023-07-20 ENCOUNTER — Other Ambulatory Visit: Payer: Self-pay

## 2023-07-28 DIAGNOSIS — I10 Essential (primary) hypertension: Secondary | ICD-10-CM | POA: Diagnosis not present

## 2023-08-03 ENCOUNTER — Other Ambulatory Visit: Payer: Self-pay | Admitting: Cardiology

## 2023-08-14 ENCOUNTER — Other Ambulatory Visit (HOSPITAL_COMMUNITY): Payer: Self-pay

## 2023-08-14 ENCOUNTER — Other Ambulatory Visit: Payer: Self-pay

## 2023-08-14 ENCOUNTER — Telehealth: Payer: Self-pay | Admitting: Cardiology

## 2023-08-14 NOTE — Telephone Encounter (Signed)
 Spoke with patient, she was concerning about how to get a refills. She had Repatha filled at [facility], gave patient phone number to contact them for a refill. No further needs

## 2023-08-14 NOTE — Telephone Encounter (Signed)
 Attempted to contact patient, left message to call our office back

## 2023-08-14 NOTE — Telephone Encounter (Signed)
 Pt c/o medication issue:  1. Name of Medication: Evolocumab (REPATHA SURECLICK) 140 MG/ML SOAJ   2. How are you currently taking this medication (dosage and times per day)? As written  3. Are you having a reaction (difficulty breathing--STAT)no   4. What is your medication issue? Pt has questions about taking medication/dosage

## 2023-08-27 DIAGNOSIS — I10 Essential (primary) hypertension: Secondary | ICD-10-CM | POA: Diagnosis not present

## 2023-09-06 ENCOUNTER — Ambulatory Visit (INDEPENDENT_AMBULATORY_CARE_PROVIDER_SITE_OTHER): Payer: Medicare Other

## 2023-09-06 DIAGNOSIS — I495 Sick sinus syndrome: Secondary | ICD-10-CM | POA: Diagnosis not present

## 2023-09-06 LAB — CUP PACEART REMOTE DEVICE CHECK
Battery Remaining Longevity: 109 mo
Battery Voltage: 3 V
Brady Statistic AP VP Percent: 0.04 %
Brady Statistic AP VS Percent: 81.79 %
Brady Statistic AS VP Percent: 0.01 %
Brady Statistic AS VS Percent: 18.17 %
Brady Statistic RA Percent Paced: 82.26 %
Brady Statistic RV Percent Paced: 0.04 %
Date Time Interrogation Session: 20250409043816
Implantable Lead Connection Status: 753985
Implantable Lead Connection Status: 753985
Implantable Lead Implant Date: 19970416
Implantable Lead Implant Date: 19970416
Implantable Lead Location: 753859
Implantable Lead Location: 753860
Implantable Lead Model: 5034
Implantable Lead Model: 5534
Implantable Pulse Generator Implant Date: 20200113
Lead Channel Impedance Value: 646 Ohm
Lead Channel Impedance Value: 665 Ohm
Lead Channel Impedance Value: 722 Ohm
Lead Channel Impedance Value: 779 Ohm
Lead Channel Pacing Threshold Amplitude: 0.5 V
Lead Channel Pacing Threshold Amplitude: 0.875 V
Lead Channel Pacing Threshold Pulse Width: 0.4 ms
Lead Channel Pacing Threshold Pulse Width: 0.4 ms
Lead Channel Sensing Intrinsic Amplitude: 2.125 mV
Lead Channel Sensing Intrinsic Amplitude: 2.125 mV
Lead Channel Sensing Intrinsic Amplitude: 20.5 mV
Lead Channel Sensing Intrinsic Amplitude: 20.5 mV
Lead Channel Setting Pacing Amplitude: 1.5 V
Lead Channel Setting Pacing Amplitude: 2.5 V
Lead Channel Setting Pacing Pulse Width: 0.4 ms
Lead Channel Setting Sensing Sensitivity: 2 mV
Zone Setting Status: 755011
Zone Setting Status: 755011

## 2023-09-12 ENCOUNTER — Encounter: Payer: Self-pay | Admitting: Cardiovascular Disease

## 2023-09-12 DIAGNOSIS — I10 Essential (primary) hypertension: Secondary | ICD-10-CM | POA: Diagnosis not present

## 2023-09-12 DIAGNOSIS — Z713 Dietary counseling and surveillance: Secondary | ICD-10-CM | POA: Diagnosis not present

## 2023-09-12 DIAGNOSIS — Z0000 Encounter for general adult medical examination without abnormal findings: Secondary | ICD-10-CM | POA: Diagnosis not present

## 2023-09-12 DIAGNOSIS — I739 Peripheral vascular disease, unspecified: Secondary | ICD-10-CM | POA: Diagnosis not present

## 2023-09-12 DIAGNOSIS — E7800 Pure hypercholesterolemia, unspecified: Secondary | ICD-10-CM | POA: Diagnosis not present

## 2023-09-12 DIAGNOSIS — Z7189 Other specified counseling: Secondary | ICD-10-CM | POA: Diagnosis not present

## 2023-09-12 DIAGNOSIS — Z299 Encounter for prophylactic measures, unspecified: Secondary | ICD-10-CM | POA: Diagnosis not present

## 2023-09-12 DIAGNOSIS — I495 Sick sinus syndrome: Secondary | ICD-10-CM | POA: Diagnosis not present

## 2023-09-12 DIAGNOSIS — Z79899 Other long term (current) drug therapy: Secondary | ICD-10-CM | POA: Diagnosis not present

## 2023-09-12 DIAGNOSIS — R5383 Other fatigue: Secondary | ICD-10-CM | POA: Diagnosis not present

## 2023-09-12 DIAGNOSIS — I700 Atherosclerosis of aorta: Secondary | ICD-10-CM | POA: Diagnosis not present

## 2023-09-18 DIAGNOSIS — H401132 Primary open-angle glaucoma, bilateral, moderate stage: Secondary | ICD-10-CM | POA: Diagnosis not present

## 2023-09-18 DIAGNOSIS — H11153 Pinguecula, bilateral: Secondary | ICD-10-CM | POA: Diagnosis not present

## 2023-09-18 DIAGNOSIS — Z961 Presence of intraocular lens: Secondary | ICD-10-CM | POA: Diagnosis not present

## 2023-09-27 DIAGNOSIS — I10 Essential (primary) hypertension: Secondary | ICD-10-CM | POA: Diagnosis not present

## 2023-09-29 ENCOUNTER — Ambulatory Visit: Payer: Medicare Other | Attending: Cardiovascular Disease | Admitting: Cardiovascular Disease

## 2023-09-29 ENCOUNTER — Encounter: Payer: Self-pay | Admitting: Cardiovascular Disease

## 2023-09-29 VITALS — BP 114/76 | HR 96 | Ht 64.0 in | Wt 189.6 lb

## 2023-09-29 DIAGNOSIS — Z950 Presence of cardiac pacemaker: Secondary | ICD-10-CM | POA: Diagnosis not present

## 2023-09-29 NOTE — Progress Notes (Signed)
    PCP: Orlena Bitters, MD Primary Cardiologist: Dr Felipe Horton Primary EP:  Dr Arlester Ladd  Sylvia Anthony is a 68 y.o. female who presents today for routine electrophysiology followup.  Since last being seen in our clinic, the patient reports doing reasonably well.    She has a history of sick sinus syndrome.  Maker was placed originally in 1997, and she had a generator change in 2020.  She has seen Dr. Felipe Horton in the past, but now has established with Dr. Renna Cary.    Today, she denies symptoms of palpitations, chest pain, shortness of breath,  dizziness, presyncope, or syncope.  The patient is otherwise without complaint today.     Physical Exam: Vitals:   09/29/23 0916  BP: 114/76  Pulse: 96  SpO2: 97%  Weight: 189 lb 9.6 oz (86 kg)  Height: 5\' 4"  (1.626 m)    Gen: Appears comfortable, well-nourished CV: RRR, no dependent edema The device site is normal -- no tenderness, edema, drainage, redness, threatened erosion. Pulm: breathing easily   Pacemaker interrogation- reviewed in detail today,  See PACEART report I reviewed 07/17/23 ECG -- A-paced with 1st degree AV block RBBB' (my interpretation)  Assessment and Plan:  1. Symptomatic sinus bradycardia  Normal pacemaker function See Pace Art report No changes today she is not device dependant today  2. HTN Stable No change required today  3. Overweight Body mass index is 32.54 kg/m. Lifestyle modification advised   Return in a year to see me Follow-up with Dr Felipe Horton as scheduled  Efraim Grange, MD 09/29/2023 9:21 AM

## 2023-09-29 NOTE — Patient Instructions (Signed)

## 2023-10-03 DIAGNOSIS — E7800 Pure hypercholesterolemia, unspecified: Secondary | ICD-10-CM | POA: Diagnosis not present

## 2023-10-03 DIAGNOSIS — Z0000 Encounter for general adult medical examination without abnormal findings: Secondary | ICD-10-CM | POA: Diagnosis not present

## 2023-10-03 DIAGNOSIS — I10 Essential (primary) hypertension: Secondary | ICD-10-CM | POA: Diagnosis not present

## 2023-10-03 DIAGNOSIS — Z299 Encounter for prophylactic measures, unspecified: Secondary | ICD-10-CM | POA: Diagnosis not present

## 2023-10-03 DIAGNOSIS — I700 Atherosclerosis of aorta: Secondary | ICD-10-CM | POA: Diagnosis not present

## 2023-10-03 DIAGNOSIS — E039 Hypothyroidism, unspecified: Secondary | ICD-10-CM | POA: Diagnosis not present

## 2023-10-20 NOTE — Progress Notes (Signed)
 Remote pacemaker transmission.

## 2023-10-25 ENCOUNTER — Telehealth: Payer: Self-pay | Admitting: Pharmacist

## 2023-10-25 NOTE — Telephone Encounter (Signed)
 Outside labs from 09/12/23 reviewed. LDL-C 87, TC 155, TG 102, HDL 49 Although LDL-C above goal of <70, significant improvement in LDL-C seen (LDL-C as high as 238). Multiple statin intolerances. Continue Repatha   Patient made aware I reviewed results.

## 2023-10-28 DIAGNOSIS — I10 Essential (primary) hypertension: Secondary | ICD-10-CM | POA: Diagnosis not present

## 2023-11-06 DIAGNOSIS — H401132 Primary open-angle glaucoma, bilateral, moderate stage: Secondary | ICD-10-CM | POA: Diagnosis not present

## 2023-11-27 DIAGNOSIS — I10 Essential (primary) hypertension: Secondary | ICD-10-CM | POA: Diagnosis not present

## 2023-11-29 DIAGNOSIS — I700 Atherosclerosis of aorta: Secondary | ICD-10-CM | POA: Diagnosis not present

## 2023-11-29 DIAGNOSIS — E7800 Pure hypercholesterolemia, unspecified: Secondary | ICD-10-CM | POA: Diagnosis not present

## 2023-11-29 DIAGNOSIS — Z299 Encounter for prophylactic measures, unspecified: Secondary | ICD-10-CM | POA: Diagnosis not present

## 2023-11-29 DIAGNOSIS — I10 Essential (primary) hypertension: Secondary | ICD-10-CM | POA: Diagnosis not present

## 2023-11-29 DIAGNOSIS — E039 Hypothyroidism, unspecified: Secondary | ICD-10-CM | POA: Diagnosis not present

## 2023-11-29 DIAGNOSIS — H109 Unspecified conjunctivitis: Secondary | ICD-10-CM | POA: Diagnosis not present

## 2023-12-05 NOTE — Progress Notes (Shared)
 [facility] - Clinic Note  12/18/2023    CHIEF COMPLAINT Patient presents for No chief complaint on file.  HISTORY OF PRESENT ILLNESS: Sylvia Anthony is a 68 y.o. female who presents to the clinic today for:    Patient states she has had some soreness in the right eye, she has been using PF, which has helped, she does not use it regularly, no new health concerns, she is on diclofenac as needed for her arthritis  Referring physician: Rosamond Leta KATHEE, MD [address] [place],  [place] [address]  HISTORICAL INFORMATION:   Selected notes from the MEDICAL RECORD NUMBER Referred by Dr. Nicholaus for concern of bilateral vitritis.   CURRENT MEDICATIONS: Current Outpatient Medications (Ophthalmic Drugs)  Medication Sig   brimonidine  (ALPHAGAN ) 0.2 % ophthalmic solution Place 1 drop into both eyes 2 (two) times daily.   COSOPT  22.3-6.8 MG/ML ophthalmic solution Place 1 drop into both eyes 2 (two) times daily.   No current facility-administered medications for this visit. (Ophthalmic Drugs)   Current Outpatient Medications (Other)  Medication Sig   albuterol (PROVENTIL HFA;VENTOLIN HFA) 108 (90 BASE) MCG/ACT inhaler Inhale 2 puffs into the lungs every 6 (six) hours as needed for wheezing or shortness of breath.   amLODipine  (NORVASC ) 10 MG tablet Take 10 mg by mouth daily.   aspirin  EC 81 MG tablet Take 1 tablet (81 mg total) by mouth daily.   carvedilol  (COREG ) 6.25 MG tablet TAKE 1 TABLET(6.25 MG) BY MOUTH TWICE DAILY   cetirizine (ZYRTEC) 10 MG tablet Take 10 mg by mouth at bedtime as needed for allergies.    diclofenac (VOLTAREN) 75 MG EC tablet Take 75 mg by mouth daily as needed for mild pain.   Evolocumab  (REPATHA  SURECLICK) 140 MG/ML SOAJ Inject 140 mg into the skin every 14 (fourteen) days.   ibuprofen  (ADVIL ) 200 MG tablet Take 400 mg by mouth every 8 (eight) hours as needed (pain.).   levothyroxine (SYNTHROID, LEVOTHROID) 112 MCG tablet Take 112 mcg by mouth daily  before breakfast.    Niacin (VITAMIN B-3 PO) Take 1 tablet by mouth every 3 (three) days.   nitroGLYCERIN  (NITROSTAT ) 0.4 MG SL tablet Place 1 tablet (0.4 mg total) under the tongue every 5 (five) minutes as needed for chest pain.   pantoprazole  (PROTONIX ) 40 MG tablet Take 1 tablet (40 mg total) by mouth 2 (two) times daily before a meal.   potassium chloride SA (K-DUR,KLOR-CON) 20 MEQ tablet Take 20 mEq by mouth in the morning.   valsartan-hydrochlorothiazide (DIOVAN-HCT) 320-25 MG per tablet Take 1 tablet by mouth in the morning.   No current facility-administered medications for this visit. (Other)   REVIEW OF SYSTEMS:    ALLERGIES Allergies  Allergen Reactions   Crestor [Rosuvastatin] Swelling and Other (See Comments)    Muscle aches/inflammation   Neurontin [Gabapentin] Swelling    Disoriented/out of it   PAST MEDICAL HISTORY Past Medical History:  Diagnosis Date   Anemia    Arm numbness    Cataract    Mixed form OU   Fibromyalgia    GERD (gastroesophageal reflux disease)    Glucose intolerance (impaired glucose tolerance)    H. pylori infection 01/2012   Amoxicillin, Biaxin   Hyperlipidemia    Hypertension    Hypertensive retinopathy    OU   Hypothyroidism    Mitral valve prolapse    not seen on recent echoes   Presence of permanent cardiac pacemaker    Rheumatic fever  age 59, was at [facility] for 17 weeks   SSS (sick sinus syndrome) (HCC)    1997 ppm, gen change 2020 (MDT)   Past Surgical History:  Procedure Laterality Date   ABDOMINAL HYSTERECTOMY     partial   APPENDECTOMY     BALLOON DILATION N/A 10/18/2021   Procedure: BALLOON DILATION;  Surgeon: Cindie Carlin POUR, DO;  [facility];  Service: Endoscopy;  Laterality: N/A;   BIOPSY  10/18/2021   Procedure: BIOPSY;  Surgeon: Cindie Carlin POUR, DO;  [facility];  Service: Endoscopy;;   CARDIAC CATHETERIZATION     multiple   CHOLECYSTECTOMY     COLONOSCOPY  08/01/2006    3 mm descending colon polyp removed/8 mm sessile ascending colon polyp removed / 3-mm rectal  polyp removed /Rare sigmoid diverticulosis/ Moderate internal hemorrhoids.Advanced adenoma on colonoscopy in March 2008.  The polyp was anadenomatous polyp with a foci of high-grade dysplasia   COLONOSCOPY  09/08/2008   SIMPLE ADENOMA/HYPERPLASTIC POLY/Multiple colon polyps (ascending, sigmoid, rectal)  Mild sigmoid colon diverticulosis./ Small internal hemorrhoids   COLONOSCOPY N/A 12/11/2017   Dr. Harvey: diverticulosis, hemorrhoids next surveillance tcs 5 years.    COLONOSCOPY WITH ESOPHAGOGASTRODUODENOSCOPY (EGD)  Sept. 30, 2013   DOQ:Fpoi diverticulosis was noted in the sigmoid colon/The colon was otherwise normal/Small internal hemorrhoids/EGD:The mucosa of the esophagus appeared normal/Non-erosive gastritis (inflammation) was found; multiple bx/The duodenal mucosa showed no abnormalities. +H.pylori gastritis, treated with equivalent of prevpac. SAVARY DILATION   COLONOSCOPY WITH PROPOFOL  N/A 03/20/2023   Procedure: COLONOSCOPY WITH PROPOFOL ;  Surgeon: Cindie Carlin POUR, DO;  [facility];  Service: Endoscopy;  Laterality: N/A;  10:30 am, asa 3   ESOPHAGEAL DILATION N/A 10/07/2014   Procedure: ESOPHAGEAL DILATION;  Surgeon: Margo LITTIE Harvey, MD;  [facility];  Service: Endoscopy;  Laterality: N/A;   ESOPHAGOGASTRODUODENOSCOPY N/A 10/07/2014   Dr. Harvey: moderate non-erosive gastritis, no definite stricture, empiric dilation . negative H.pylori    ESOPHAGOGASTRODUODENOSCOPY (EGD) WITH PROPOFOL  N/A 10/18/2021   Procedure: ESOPHAGOGASTRODUODENOSCOPY (EGD) WITH PROPOFOL ;  Surgeon: Cindie Carlin POUR, DO;  [facility];  Service: Endoscopy;  Laterality: N/A;  2:30pm   growth removed from intestine     [facility]-as teenager, done through colonoscopy   PACEMAKER GENERATOR CHANGE  09/18/2006   generator change by Dr Malva with a MDT Adapta L   PACEMAKER INSERTION  09/13/1995   for  sick sinus syndome and syncope at [facility] GENERATOR CHANGEOUT N/A 06/11/2018    [employer] Azure XT DR MRI SureScan model [id number] (serial number [id number]) pacemaker by Dr Kelsie   right Achilles tendon     X 2   TONSILLECTOMY     FAMILY HISTORY Family History  Problem Relation Age of Onset   Hyperlipidemia Mother    Hypertension Mother    Heart disease Father    Hypertension Father    Hyperlipidemia Sister    Hypertension Sister    Colon polyps Sister    Hyperlipidemia Brother    Hypertension Brother    Colon polyps Brother    Colon cancer Neg Hx    SOCIAL HISTORY Social History   Tobacco Use   Smoking status: Former    Current packs/day: 0.00    Average packs/day: 0.3 packs/day for 4.0 years (1.0 ttl pk-yrs)    Types: Cigarettes    Start date: 10/09/1981    Quit date: 10/09/1985    Years since quitting: 71.1  Smokeless tobacco: Never   Tobacco comments:    just in teens  Vaping Use   Vaping status: Never Used  Substance Use Topics   Alcohol use: No    Alcohol/week: 0.0 standard drinks of alcohol   Drug use: No       OPHTHALMIC EXAM:  Not recorded    IMAGING AND PROCEDURES  Imaging and Procedures for @TODAY @          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 Cystoid macular edema of both eyes  H35.353     3. Uveitis  H20.9     4. Essential hypertension  I10     5. Hypertensive retinopathy of both eyes  H35.033     6. Pseudophakia, both eyes  Z96.1     7. 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 Glaucoma suspect of both eyes  H40.003     10. Ptosis of left eyelid  H02.402       1-3. Mild Panuveitis w/ CME OU  - s/p STK OD #1 (05.25.21).  - s/p STK OS #1 (10.10.23) - episode of recurrent CME OS likely related to cataract sx (April/May 2023); OD without CME  - delayed to follow up from 10.11.21 to 11.29.22 - at initial presentation, pt  reported 1+ mo history of floaters and decreased vision OU; +photophobia  - saw Dr. Nicholaus, who noted [facility] cell and started PF q1h - initial exam here showed +cell/pigment in [facility] and vitreous cavity; mild vitreous haze and +central CME OU - FA (02.15.21) showed central petaloid hyperfluorescence and hyperfluorescence of the optic disc  - pt reports history of fibromyalgia, family history of lupus  - initiated uveitis lab work up -- all WNL except +toxo IgG   CBC, CMP   RPR, VDRL, FTA-Abs, MHA   HIV, Lyme, Quant-Gold   Toxoplasma titers   HLA Panel   ANA   ANCA   ACE, Lysozyme   RF   ESR, CRP   CXR - chest x-ray without TB or pulmonary sarcoidosis, but did reveal a 1.6cm nodular mass in right lung base  - hx of Covid-19 infection ?involvement - repeat FA (08.23.21) showed interval improvement in perifoveal petaloid leakage OU  - OCT shows stable resolution of IRF/CME and SRF  - discussed findings  - off PredForte due to history of steroid response  - off Prolensa  qdaily OS -- okay to continue holding  - f/u 6-9 months, sooner prn -- DFE/OCT  4,5. Hypertensive retinopathy OU  - discussed importance of tight BP control  - monitor  6. Pseudophakia OU  - s/p CE/IOL OU (Dr. Fleeta, April and May 2023)  - IOLs in good position, doing well  - monitor  7. 1.6 cm lung nodule of R lung base found on CXR - following with Pulmonology -- lesion shrinking and no interventions have been recommended - PET imaging done on 3.11.21 -- identifying some metabolic lesions (see report below) - CT chest performed 4.14.21 -- indicating mild dec in some dimensions (see report below) - pt saw Dr. Shelah who had originally scheduled a bronchoscopy, but was cancelled following the CT evidence of decreasing size on CT  - nodule continues to decrease in size on imaging  8,9. 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 Fleeta  - IOP 12,  11 -- suspect steroid response --  discontinued PF as above  - +family history  - C/D increased OU -- OD 0.75; OS 0.65 - reviewed instructions: Cosopt  BID OU and Brimonidine  BID OU  - s/p SLT OS (03.25.24 -- Dr. Fleeta)   10. Ptosis OS  - under the expert management of Dr. Fleeta  - referred for surgical consult to [facility]   Ophthalmic Meds Ordered this visit:  No orders of the defined types were placed in this encounter.    No follow-ups on file.  There are no Patient Instructions on file for this visit.  Explained the diagnoses, plan, and follow up with the patient and they expressed understanding.  Patient expressed understanding of the importance of proper follow up care.   This document serves as a record of services personally performed by Redell JUDITHANN Hans, MD, PhD. It was created on their behalf by Avelina Pereyra, COA an ophthalmic technician. The creation of this record is the provider's dictation and/or activities during the visit.   Electronically signed by: Avelina GORMAN Pereyra, COT  12/05/23  4:08 PM   Redell JUDITHANN Hans, M.D., Ph.D. Diseases & Surgery of the Retina and Vitreous [facility] 12/18/2023   Abbreviations: M myopia (nearsighted); A astigmatism; H hyperopia (farsighted); P presbyopia; Mrx spectacle prescription;  CTL contact lenses; OD right eye; OS left eye; OU both eyes  XT exotropia; ET esotropia; PEK punctate epithelial keratitis; PEE punctate epithelial erosions; DES dry eye syndrome; MGD meibomian gland dysfunction; ATs artificial tears; PFAT's preservative free artificial tears; NSC nuclear sclerotic cataract; PSC posterior subcapsular cataract; ERM epi-retinal membrane; PVD posterior vitreous detachment; RD retinal detachment; DM diabetes mellitus; DR diabetic retinopathy; NPDR non-proliferative diabetic retinopathy; PDR proliferative diabetic retinopathy; CSME clinically significant macular edema; DME diabetic macular edema; dbh dot blot hemorrhages; CWS cotton wool spot;  POAG primary open angle glaucoma; C/D cup-to-disc ratio; HVF humphrey visual field; GVF goldmann visual field; OCT optical coherence tomography; IOP intraocular pressure; BRVO Branch retinal vein occlusion; CRVO central retinal vein occlusion; CRAO central retinal artery occlusion; BRAO branch retinal artery occlusion; RT retinal tear; SB scleral buckle; PPV pars plana vitrectomy; VH Vitreous hemorrhage; PRP panretinal laser photocoagulation; IVK intravitreal kenalog ; VMT vitreomacular traction; MH Macular hole;  NVD neovascularization of the disc; NVE neovascularization elsewhere; AREDS age related eye disease study; ARMD age related macular degeneration; POAG primary open angle glaucoma; EBMD epithelial/anterior basement membrane dystrophy; ACIOL anterior chamber intraocular lens; IOL intraocular lens; PCIOL posterior chamber intraocular lens; Phaco/IOL phacoemulsification with intraocular lens placement; PRK photorefractive keratectomy; LASIK laser assisted in situ keratomileusis; HTN hypertension; DM diabetes mellitus; COPD chronic obstructive pulmonary disease

## 2023-12-06 ENCOUNTER — Ambulatory Visit: Payer: Medicare Other

## 2023-12-06 DIAGNOSIS — I495 Sick sinus syndrome: Secondary | ICD-10-CM | POA: Diagnosis not present

## 2023-12-07 LAB — CUP PACEART REMOTE DEVICE CHECK
Battery Remaining Longevity: 108 mo
Battery Voltage: 3 V
Brady Statistic AP VP Percent: 0.04 %
Brady Statistic AP VS Percent: 84.31 %
Brady Statistic AS VP Percent: 0 %
Brady Statistic AS VS Percent: 15.64 %
Brady Statistic RA Percent Paced: 84.83 %
Brady Statistic RV Percent Paced: 0.04 %
Date Time Interrogation Session: 20250708223549
Implantable Lead Connection Status: 753985
Implantable Lead Connection Status: 753985
Implantable Lead Implant Date: 19970416
Implantable Lead Implant Date: 19970416
Implantable Lead Location: 753859
Implantable Lead Location: 753860
Implantable Lead Model: 5034
Implantable Lead Model: 5534
Implantable Pulse Generator Implant Date: 20200113
Lead Channel Impedance Value: 627 Ohm
Lead Channel Impedance Value: 665 Ohm
Lead Channel Impedance Value: 665 Ohm
Lead Channel Impedance Value: 741 Ohm
Lead Channel Pacing Threshold Amplitude: 0.625 V
Lead Channel Pacing Threshold Amplitude: 0.625 V
Lead Channel Pacing Threshold Pulse Width: 0.4 ms
Lead Channel Pacing Threshold Pulse Width: 0.4 ms
Lead Channel Sensing Intrinsic Amplitude: 19.5 mV
Lead Channel Sensing Intrinsic Amplitude: 19.5 mV
Lead Channel Sensing Intrinsic Amplitude: 2.125 mV
Lead Channel Sensing Intrinsic Amplitude: 2.125 mV
Lead Channel Setting Pacing Amplitude: 1.5 V
Lead Channel Setting Pacing Amplitude: 2.5 V
Lead Channel Setting Pacing Pulse Width: 0.4 ms
Lead Channel Setting Sensing Sensitivity: 2 mV
Zone Setting Status: 755011
Zone Setting Status: 755011

## 2023-12-11 ENCOUNTER — Ambulatory Visit: Payer: Self-pay | Admitting: Cardiovascular Disease

## 2023-12-11 DIAGNOSIS — Z299 Encounter for prophylactic measures, unspecified: Secondary | ICD-10-CM | POA: Diagnosis not present

## 2023-12-11 DIAGNOSIS — I10 Essential (primary) hypertension: Secondary | ICD-10-CM | POA: Diagnosis not present

## 2023-12-11 DIAGNOSIS — R519 Headache, unspecified: Secondary | ICD-10-CM | POA: Diagnosis not present

## 2023-12-11 DIAGNOSIS — R531 Weakness: Secondary | ICD-10-CM | POA: Diagnosis not present

## 2023-12-12 ENCOUNTER — Encounter (INDEPENDENT_AMBULATORY_CARE_PROVIDER_SITE_OTHER): Payer: Self-pay | Admitting: Ophthalmology

## 2023-12-12 ENCOUNTER — Ambulatory Visit (INDEPENDENT_AMBULATORY_CARE_PROVIDER_SITE_OTHER): Admitting: Ophthalmology

## 2023-12-12 DIAGNOSIS — Z961 Presence of intraocular lens: Secondary | ICD-10-CM | POA: Diagnosis not present

## 2023-12-12 DIAGNOSIS — H44113 Panuveitis, bilateral: Secondary | ICD-10-CM

## 2023-12-12 DIAGNOSIS — H35033 Hypertensive retinopathy, bilateral: Secondary | ICD-10-CM | POA: Diagnosis not present

## 2023-12-12 DIAGNOSIS — H40053 Ocular hypertension, bilateral: Secondary | ICD-10-CM | POA: Diagnosis not present

## 2023-12-12 DIAGNOSIS — H35353 Cystoid macular degeneration, bilateral: Secondary | ICD-10-CM

## 2023-12-12 DIAGNOSIS — H209 Unspecified iridocyclitis: Secondary | ICD-10-CM

## 2023-12-12 DIAGNOSIS — H40003 Preglaucoma, unspecified, bilateral: Secondary | ICD-10-CM

## 2023-12-12 DIAGNOSIS — I10 Essential (primary) hypertension: Secondary | ICD-10-CM

## 2023-12-12 DIAGNOSIS — R911 Solitary pulmonary nodule: Secondary | ICD-10-CM

## 2023-12-12 DIAGNOSIS — H02402 Unspecified ptosis of left eyelid: Secondary | ICD-10-CM

## 2023-12-12 NOTE — Progress Notes (Signed)
 [facility] - Clinic Note  12/12/2023    CHIEF COMPLAINT Patient presents for Retina Follow Up  HISTORY OF PRESENT ILLNESS: Sylvia Anthony is a 68 y.o. female who presents to the clinic today for:   HPI     Retina Follow Up   In both eyes.  This started 7.  Duration of 7 months.  Since onset it is stable.  I, the attending physician,  performed the HPI with the patient and updated documentation appropriately.        Comments   7 month retina follow up CME OU pt is reporting that she had pink eye a few weeks ago she is still having redness and heavy feeling she was given sulfacetamide 2 drops every 2 hours brom tid OU dorz bid ou rhop at bedtime       Last edited by Valdemar Rogue, MD on 12/16/2023 10:55 PM.     Patient states she had pink eye a couple weeks ago, she states it had cleared up and Friday at work she saw a solid red line on her right eye, she states her eyes feel like pressure and achy like she has a sinus infection, she is using brimonidine  TID, Cosopt  BID, timolol  once a day and Rhopressa at night, she states it seems like the drops irritate her eyes, she states they turn red when she puts the drops in them, she did not see Dr. Fleeta when she had pink eye, she went to her family dr  Referring physician: Rosamond Leta KATHEE, MD [address] [place],  [place] [address]  HISTORICAL INFORMATION:   Selected notes from the MEDICAL RECORD NUMBER Referred by Dr. Nicholaus for concern of bilateral vitritis.   CURRENT MEDICATIONS: Current Outpatient Medications (Ophthalmic Drugs)  Medication Sig   brimonidine  (ALPHAGAN ) 0.2 % ophthalmic solution Place 1 drop into both eyes 2 (two) times daily.   COSOPT  22.3-6.8 MG/ML ophthalmic solution Place 1 drop into both eyes 2 (two) times daily.   No current facility-administered medications for this visit. (Ophthalmic Drugs)   Current Outpatient Medications (Other)  Medication Sig   albuterol (PROVENTIL HFA;VENTOLIN HFA)  108 (90 BASE) MCG/ACT inhaler Inhale 2 puffs into the lungs every 6 (six) hours as needed for wheezing or shortness of breath.   amLODipine  (NORVASC ) 10 MG tablet Take 10 mg by mouth daily.   aspirin  EC 81 MG tablet Take 1 tablet (81 mg total) by mouth daily.   carvedilol  (COREG ) 6.25 MG tablet TAKE 1 TABLET(6.25 MG) BY MOUTH TWICE DAILY   cetirizine (ZYRTEC) 10 MG tablet Take 10 mg by mouth at bedtime as needed for allergies.    diclofenac (VOLTAREN) 75 MG EC tablet Take 75 mg by mouth daily as needed for mild pain.   Evolocumab  (REPATHA  SURECLICK) 140 MG/ML SOAJ Inject 140 mg into the skin every 14 (fourteen) days.   ibuprofen  (ADVIL ) 200 MG tablet Take 400 mg by mouth every 8 (eight) hours as needed (pain.).   levothyroxine (SYNTHROID, LEVOTHROID) 112 MCG tablet Take 112 mcg by mouth daily before breakfast.    Niacin (VITAMIN B-3 PO) Take 1 tablet by mouth every 3 (three) days.   nitroGLYCERIN  (NITROSTAT ) 0.4 MG SL tablet Place 1 tablet (0.4 mg total) under the tongue every 5 (five) minutes as needed for chest pain.   pantoprazole  (PROTONIX ) 40 MG tablet Take 1 tablet (40 mg total) by mouth 2 (two) times daily before a meal.   potassium chloride SA (K-DUR,KLOR-CON) 20  MEQ tablet Take 20 mEq by mouth in the morning.   valsartan-hydrochlorothiazide (DIOVAN-HCT) 320-25 MG per tablet Take 1 tablet by mouth in the morning.   No current facility-administered medications for this visit. (Other)   REVIEW OF SYSTEMS: ROS   Positive for: Musculoskeletal Last edited by Resa Delon ORN, COT on 12/12/2023  8:53 AM.     ALLERGIES Allergies  Allergen Reactions   Crestor [Rosuvastatin] Swelling and Other (See Comments)    Muscle aches/inflammation   Neurontin [Gabapentin] Swelling    Disoriented/out of it   PAST MEDICAL HISTORY Past Medical History:  Diagnosis Date   Anemia    Arm numbness    Cataract    Mixed form OU   Fibromyalgia    GERD (gastroesophageal reflux disease)     Glucose intolerance (impaired glucose tolerance)    H. pylori infection 01/2012   Amoxicillin, Biaxin   Hyperlipidemia    Hypertension    Hypertensive retinopathy    OU   Hypothyroidism    Mitral valve prolapse    not seen on recent echoes   Presence of permanent cardiac pacemaker    Rheumatic fever    age 11, was at [facility] for 17 weeks   SSS (sick sinus syndrome) (HCC)    1997 ppm, gen change 2020 (MDT)   Past Surgical History:  Procedure Laterality Date   ABDOMINAL HYSTERECTOMY     partial   APPENDECTOMY     BALLOON DILATION N/A 10/18/2021   Procedure: BALLOON DILATION;  Surgeon: Cindie Carlin POUR, DO;  [facility];  Service: Endoscopy;  Laterality: N/A;   BIOPSY  10/18/2021   Procedure: BIOPSY;  Surgeon: Cindie Carlin POUR, DO;  [facility];  Service: Endoscopy;;   CARDIAC CATHETERIZATION     multiple   CHOLECYSTECTOMY     COLONOSCOPY  08/01/2006   3 mm descending colon polyp removed/8 mm sessile ascending colon polyp removed / 3-mm rectal  polyp removed /Rare sigmoid diverticulosis/ Moderate internal hemorrhoids.Advanced adenoma on colonoscopy in March 2008.  The polyp was anadenomatous polyp with a foci of high-grade dysplasia   COLONOSCOPY  09/08/2008   SIMPLE ADENOMA/HYPERPLASTIC POLY/Multiple colon polyps (ascending, sigmoid, rectal)  Mild sigmoid colon diverticulosis./ Small internal hemorrhoids   COLONOSCOPY N/A 12/11/2017   Dr. Harvey: diverticulosis, hemorrhoids next surveillance tcs 5 years.    COLONOSCOPY WITH ESOPHAGOGASTRODUODENOSCOPY (EGD)  Sept. 30, 2013   DOQ:Fpoi diverticulosis was noted in the sigmoid colon/The colon was otherwise normal/Small internal hemorrhoids/EGD:The mucosa of the esophagus appeared normal/Non-erosive gastritis (inflammation) was found; multiple bx/The duodenal mucosa showed no abnormalities. +H.pylori gastritis, treated with equivalent of prevpac. SAVARY DILATION   COLONOSCOPY WITH PROPOFOL  N/A 03/20/2023    Procedure: COLONOSCOPY WITH PROPOFOL ;  Surgeon: Cindie Carlin POUR, DO;  [facility];  Service: Endoscopy;  Laterality: N/A;  10:30 am, asa 3   ESOPHAGEAL DILATION N/A 10/07/2014   Procedure: ESOPHAGEAL DILATION;  Surgeon: Margo LITTIE Harvey, MD;  [facility];  Service: Endoscopy;  Laterality: N/A;   ESOPHAGOGASTRODUODENOSCOPY N/A 10/07/2014   Dr. Harvey: moderate non-erosive gastritis, no definite stricture, empiric dilation . negative H.pylori    ESOPHAGOGASTRODUODENOSCOPY (EGD) WITH PROPOFOL  N/A 10/18/2021   Procedure: ESOPHAGOGASTRODUODENOSCOPY (EGD) WITH PROPOFOL ;  Surgeon: Cindie Carlin POUR, DO;  [facility];  Service: Endoscopy;  Laterality: N/A;  2:30pm   growth removed from intestine     [facility]-as teenager, done through colonoscopy   PACEMAKER GENERATOR CHANGE  09/18/2006   generator change  by Dr Malva with a MDT Adapta L   PACEMAKER INSERTION  09/13/1995   for sick sinus syndome and syncope at [facility] GENERATOR CHANGEOUT N/A 06/11/2018    Medtronic Azure XT DR MRI SureScan model [id number] (serial number [id number]) pacemaker by Dr Kelsie   right Achilles tendon     X 2   TONSILLECTOMY     FAMILY HISTORY Family History  Problem Relation Age of Onset   Hyperlipidemia Mother    Hypertension Mother    Heart disease Father    Hypertension Father    Hyperlipidemia Sister    Hypertension Sister    Colon polyps Sister    Hyperlipidemia Brother    Hypertension Brother    Colon polyps Brother    Colon cancer Neg Hx    SOCIAL HISTORY Social History   Tobacco Use   Smoking status: Former    Current packs/day: 0.00    Average packs/day: 0.3 packs/day for 4.0 years (1.0 ttl pk-yrs)    Types: Cigarettes    Start date: 10/09/1981    Quit date: 10/09/1985    Years since quitting: 38.2   Smokeless tobacco: Never   Tobacco comments:    just in teens  Vaping Use   Vaping status: Never Used  Substance Use Topics   Alcohol use: No     Alcohol/week: 0.0 standard drinks of alcohol   Drug use: No       OPHTHALMIC EXAM:  Base Eye Exam     Visual Acuity (Snellen - Linear)       Right Left   Dist [place] 20/25 20/25 -3   Dist ph [place]  NI         Tonometry (Tonopen, 8:57 AM)       Right Left   Pressure 14 12         Pupils       Pupils Dark Light Shape React APD   Right PERRL 3 2 Round Brisk None   Left PERRL 3 2 Round Brisk None         Visual Fields       Left Right    Full Full         Extraocular Movement       Right Left    Full, Ortho Full, Ortho         Neuro/Psych     Oriented x3: Yes   Mood/Affect: Normal         Dilation     Both eyes: 2.5% Phenylephrine @ 8:57 AM           Slit Lamp and Fundus Exam     Slit Lamp Exam       Right Left   Lids/Lashes Dermatochalasis - upper lid Mild Dermatochalasis - upper lid, mild ptosis   Conjunctiva/Sclera 1+ Injection, Melanosis, Pinguecula 1+ Injection, Melanosis, Pinguecula   Cornea Mild Arcus, well healed cataract wound, 2+ inferior PEE, tear film debris Mild Arcus, well healed cataract wound, 1+ inferior Punctate epithelial erosions   Anterior Chamber deep, clear, narrow temporal angle, no cell/flare Deep and clear, narrow temporal angle, no cell or flare   Iris Round and dilated Round and dilated   Lens PC IOL in good position PC IOL in good position   Anterior Vitreous Vitreous syneresis, no cell/pigment mild syneresis, no cell/pigment, mild vitreous condensations settled inferiorly         Fundus Exam       Right Left  Disc Pink and Sharp, central cupping and pallor, mild PPP Pink and Sharp   C/D Ratio 0.75 0.65   Macula Flat, good foveal reflex, mild RPE mottling, no edema, focal SRH IT mac Flat, good foveal reflex, central CME -- stably resolved, mild Retinal pigment epithelial mottling, no heme   Vessels attenuated, Tortuous attenuated, mild tortuosity   Periphery Attached, no heme, no snowbanking, peripheral  pigmented cystoid degeneration inferiory Attached, no heme, no snowbanking, peripheral pigmented cystoid degeneration inferiory           IMAGING AND PROCEDURES  Imaging and Procedures for @TODAY @  OCT, Retina - OU - Both Eyes       Right Eye Quality was good. Central Foveal Thickness: 248. Progression has been stable. Findings include normal foveal contour, no IRF, no SRF (No IRF/CME).   Left Eye Quality was good. Central Foveal Thickness: 245. Progression has been stable. Findings include normal foveal contour, no IRF, no SRF (Stable resolution of IRF/CME and SRF).   Notes *Images captured and stored on drive  Diagnosis / Impression:  OD: No IRF/CME OS: Stable resolution of IRF/CME and SRF  Clinical management:  See below  Abbreviations: NFP - Normal foveal profile. CME - cystoid macular edema. PED - pigment epithelial detachment. IRF - intraretinal fluid. SRF - subretinal fluid. EZ - ellipsoid zone. ERM - epiretinal membrane. ORA - outer retinal atrophy. ORT - outer retinal tubulation. 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 Cystoid macular edema of both eyes  H35.353 OCT, Retina - OU - Both Eyes    3. Uveitis  H20.9     4. Essential hypertension  I10     5. Hypertensive retinopathy of both eyes  H35.033     6. Pseudophakia, both eyes  Z96.1     7. 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 Glaucoma suspect of both eyes  H40.003     10. Ptosis of left eyelid  H02.402      1-3. Mild Panuveitis w/ CME OU  - s/p STK OD #1 (05.25.21).  - s/p STK OS #1 (10.10.23) - episode of recurrent CME OS likely related to cataract sx (April/May 2023); OD without CME  - delayed to follow up from 10.11.21 to 11.29.22 - at initial presentation, pt reported 1+ mo history of floaters and decreased vision OU; +photophobia  - saw Dr. Nicholaus, who  noted [facility] cell and started PF q1h - initial exam here showed +cell/pigment in [facility] and vitreous cavity; mild vitreous haze and +central CME OU - FA (02.15.21) showed central petaloid hyperfluorescence and hyperfluorescence of the optic disc  - pt reports history of fibromyalgia, family history of lupus  - initiated uveitis lab work up -- all WNL except +toxo IgG   CBC, CMP   RPR, VDRL, FTA-Abs, MHA   HIV, Lyme, Quant-Gold   Toxoplasma titers   HLA Panel   ANA   ANCA   ACE, Lysozyme   RF   ESR, CRP   CXR - chest x-ray without TB or pulmonary sarcoidosis, but did reveal a 1.6cm nodular mass in right lung base  - hx of Covid-19 infection ?involvement - repeat FA (08.23.21) showed interval improvement in perifoveal petaloid leakage OU  - OCT shows stable resolution of IRF/CME  and SRF  - discussed findings  - off PredForte due to history of steroid response  - off Prolensa  qdaily OS -- okay to continue holding  - f/u 6-9 months, sooner prn -- DFE/OCT  4,5. Hypertensive retinopathy OU  - discussed importance of tight BP control  - monitor  6. Pseudophakia OU  - s/p CE/IOL OU (Dr. Fleeta, April and May 2023)  - IOLs in good position, doing well  - monitor  7. 1.6 cm lung nodule of R lung base found on CXR - following with Pulmonology -- lesion shrinking and no interventions have been recommended - PET imaging done on 3.11.21 -- identifying some metabolic lesions (see report below) - CT chest performed 4.14.21 -- indicating mild dec in some dimensions (see report below) - pt saw Dr. Shelah who had originally scheduled a bronchoscopy, but was cancelled following the CT evidence of decreasing size on CT  - nodule continues to decrease in size on imaging  8,9. 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 Fleeta  - IOP 14,12 -- suspect steroid response -- discontinued PF as above  - +family history  - C/D increased OU -- OD 0.75; OS 0.65 - reviewed  instructions: Cosopt  BID OU and Brimonidine  BID OU  - s/p SLT OS (03.25.24 -- Dr. Fleeta)   10. Ptosis OS  - under the expert management of Dr. Fleeta  - referred for surgical consult to [facility]   Ophthalmic Meds Ordered this visit:  No orders of the defined types were placed in this encounter.    Return for f/u 6-9 months, panuveitis / CME OU, DFE, OCT.  There are no Patient Instructions on file for this visit.  Explained the diagnoses, plan, and follow up with the patient and they expressed understanding.  Patient expressed understanding of the importance of proper follow up care.   This document serves as a record of services personally performed by Redell JUDITHANN Hans, MD, PhD. It was created on their behalf by Alan PARAS. Delores, OA an ophthalmic technician. The creation of this record is the provider's dictation and/or activities during the visit.    Electronically signed by: Alan PARAS. Delores, OA 12/16/23 10:56 PM  Redell JUDITHANN Hans, M.D., Ph.D. Diseases & Surgery of the Retina and Vitreous [facility] 12/12/2023  I have reviewed the above documentation for accuracy and completeness, and I agree with the above. Redell JUDITHANN Hans, M.D., Ph.D. 12/16/23 10:59 PM   Abbreviations: M myopia (nearsighted); A astigmatism; H hyperopia (farsighted); P presbyopia; Mrx spectacle prescription;  CTL contact lenses; OD right eye; OS left eye; OU both eyes  XT exotropia; ET esotropia; PEK punctate epithelial keratitis; PEE punctate epithelial erosions; DES dry eye syndrome; MGD meibomian gland dysfunction; ATs artificial tears; PFAT's preservative free artificial tears; NSC nuclear sclerotic cataract; PSC posterior subcapsular cataract; ERM epi-retinal membrane; PVD posterior vitreous detachment; RD retinal detachment; DM diabetes mellitus; DR diabetic retinopathy; NPDR non-proliferative diabetic retinopathy; PDR proliferative diabetic retinopathy; CSME clinically significant macular  edema; DME diabetic macular edema; dbh dot blot hemorrhages; CWS cotton wool spot; POAG primary open angle glaucoma; C/D cup-to-disc ratio; HVF humphrey visual field; GVF goldmann visual field; OCT optical coherence tomography; IOP intraocular pressure; BRVO Branch retinal vein occlusion; CRVO central retinal vein occlusion; CRAO central retinal artery occlusion; BRAO branch retinal artery occlusion; RT retinal tear; SB scleral buckle; PPV pars plana vitrectomy; VH Vitreous hemorrhage; PRP panretinal laser photocoagulation; IVK intravitreal kenalog ;  VMT vitreomacular traction; MH Macular hole;  NVD neovascularization of the disc; NVE neovascularization elsewhere; AREDS age related eye disease study; ARMD age related macular degeneration; POAG primary open angle glaucoma; EBMD epithelial/anterior basement membrane dystrophy; ACIOL anterior chamber intraocular lens; IOL intraocular lens; PCIOL posterior chamber intraocular lens; Phaco/IOL phacoemulsification with intraocular lens placement; PRK photorefractive keratectomy; LASIK laser assisted in situ keratomileusis; HTN hypertension; DM diabetes mellitus; COPD chronic obstructive pulmonary disease

## 2023-12-14 DIAGNOSIS — G43909 Migraine, unspecified, not intractable, without status migrainosus: Secondary | ICD-10-CM | POA: Diagnosis not present

## 2023-12-14 DIAGNOSIS — R519 Headache, unspecified: Secondary | ICD-10-CM | POA: Diagnosis not present

## 2023-12-16 ENCOUNTER — Encounter (INDEPENDENT_AMBULATORY_CARE_PROVIDER_SITE_OTHER): Payer: Self-pay | Admitting: Ophthalmology

## 2023-12-18 ENCOUNTER — Encounter (INDEPENDENT_AMBULATORY_CARE_PROVIDER_SITE_OTHER): Payer: Medicare Other | Admitting: Ophthalmology

## 2023-12-18 DIAGNOSIS — H35353 Cystoid macular degeneration, bilateral: Secondary | ICD-10-CM

## 2023-12-18 DIAGNOSIS — H02402 Unspecified ptosis of left eyelid: Secondary | ICD-10-CM

## 2023-12-18 DIAGNOSIS — H209 Unspecified iridocyclitis: Secondary | ICD-10-CM

## 2023-12-18 DIAGNOSIS — R911 Solitary pulmonary nodule: Secondary | ICD-10-CM

## 2023-12-18 DIAGNOSIS — I10 Essential (primary) hypertension: Secondary | ICD-10-CM

## 2023-12-18 DIAGNOSIS — H40003 Preglaucoma, unspecified, bilateral: Secondary | ICD-10-CM

## 2023-12-18 DIAGNOSIS — H35033 Hypertensive retinopathy, bilateral: Secondary | ICD-10-CM

## 2023-12-18 DIAGNOSIS — Z961 Presence of intraocular lens: Secondary | ICD-10-CM

## 2023-12-18 DIAGNOSIS — H44113 Panuveitis, bilateral: Secondary | ICD-10-CM

## 2023-12-18 DIAGNOSIS — H40053 Ocular hypertension, bilateral: Secondary | ICD-10-CM

## 2023-12-28 DIAGNOSIS — I10 Essential (primary) hypertension: Secondary | ICD-10-CM | POA: Diagnosis not present

## 2024-01-25 ENCOUNTER — Other Ambulatory Visit (HOSPITAL_COMMUNITY): Payer: Self-pay

## 2024-01-25 ENCOUNTER — Telehealth (HOSPITAL_COMMUNITY): Payer: Self-pay

## 2024-01-26 ENCOUNTER — Other Ambulatory Visit (HOSPITAL_COMMUNITY): Payer: Self-pay

## 2024-01-27 DIAGNOSIS — I10 Essential (primary) hypertension: Secondary | ICD-10-CM | POA: Diagnosis not present

## 2024-01-30 ENCOUNTER — Other Ambulatory Visit (HOSPITAL_COMMUNITY): Payer: Self-pay

## 2024-01-30 ENCOUNTER — Other Ambulatory Visit: Payer: Self-pay

## 2024-01-30 ENCOUNTER — Telehealth: Payer: Self-pay | Admitting: Pharmacy Technician

## 2024-01-30 NOTE — Telephone Encounter (Signed)
 Pharmacy Patient Advocate Encounter   Received notification from Physician's Office that prior authorization for repatha  is required/requested.   Insurance verification completed.   The patient is insured through [employer] .   Per test claim: PA required; PA submitted to above mentioned insurance via Latent Key/confirmation #/EOC [id number] Status is pending

## 2024-01-30 NOTE — Telephone Encounter (Signed)
 Pharmacy Patient Advocate Encounter  Received notification from [facility] that Prior Authorization for repatha  has been APPROVED from 01/30/24 to 05/29/24. Ran test claim, Copay is $47.00- one month. This test claim was processed through [facility]- copay amounts may vary at other pharmacies due to pharmacy/plan contracts, or as the patient moves through the different stages of their insurance plan.   PA #/Case ID/Reference #: [id number]

## 2024-02-09 ENCOUNTER — Encounter: Payer: Self-pay | Admitting: Internal Medicine

## 2024-02-27 ENCOUNTER — Other Ambulatory Visit: Payer: Self-pay | Admitting: Internal Medicine

## 2024-02-27 DIAGNOSIS — Z1231 Encounter for screening mammogram for malignant neoplasm of breast: Secondary | ICD-10-CM

## 2024-02-27 DIAGNOSIS — I10 Essential (primary) hypertension: Secondary | ICD-10-CM | POA: Diagnosis not present

## 2024-03-05 DIAGNOSIS — Z299 Encounter for prophylactic measures, unspecified: Secondary | ICD-10-CM | POA: Diagnosis not present

## 2024-03-05 DIAGNOSIS — T466X5A Adverse effect of antihyperlipidemic and antiarteriosclerotic drugs, initial encounter: Secondary | ICD-10-CM | POA: Diagnosis not present

## 2024-03-05 DIAGNOSIS — I10 Essential (primary) hypertension: Secondary | ICD-10-CM | POA: Diagnosis not present

## 2024-03-05 DIAGNOSIS — G720 Drug-induced myopathy: Secondary | ICD-10-CM | POA: Diagnosis not present

## 2024-03-05 DIAGNOSIS — Z23 Encounter for immunization: Secondary | ICD-10-CM | POA: Diagnosis not present

## 2024-03-06 ENCOUNTER — Ambulatory Visit: Payer: Medicare Other

## 2024-03-06 DIAGNOSIS — I495 Sick sinus syndrome: Secondary | ICD-10-CM

## 2024-03-07 LAB — CUP PACEART REMOTE DEVICE CHECK
Battery Remaining Longevity: 107 mo
Battery Voltage: 3 V
Brady Statistic AP VP Percent: 0.04 %
Brady Statistic AP VS Percent: 79.2 %
Brady Statistic AS VP Percent: 0.01 %
Brady Statistic AS VS Percent: 20.75 %
Brady Statistic RA Percent Paced: 79.7 %
Brady Statistic RV Percent Paced: 0.04 %
Date Time Interrogation Session: 20251007233652
Implantable Lead Connection Status: 753985
Implantable Lead Connection Status: 753985
Implantable Lead Implant Date: 19970416
Implantable Lead Implant Date: 19970416
Implantable Lead Location: 753859
Implantable Lead Location: 753860
Implantable Lead Model: 5034
Implantable Lead Model: 5534
Implantable Pulse Generator Implant Date: 20200113
Lead Channel Impedance Value: 646 Ohm
Lead Channel Impedance Value: 665 Ohm
Lead Channel Impedance Value: 665 Ohm
Lead Channel Impedance Value: 741 Ohm
Lead Channel Pacing Threshold Amplitude: 0.625 V
Lead Channel Pacing Threshold Amplitude: 0.875 V
Lead Channel Pacing Threshold Pulse Width: 0.4 ms
Lead Channel Pacing Threshold Pulse Width: 0.4 ms
Lead Channel Sensing Intrinsic Amplitude: 2 mV
Lead Channel Sensing Intrinsic Amplitude: 2 mV
Lead Channel Sensing Intrinsic Amplitude: 20.375 mV
Lead Channel Sensing Intrinsic Amplitude: 20.375 mV
Lead Channel Setting Pacing Amplitude: 1.5 V
Lead Channel Setting Pacing Amplitude: 2.5 V
Lead Channel Setting Pacing Pulse Width: 0.4 ms
Lead Channel Setting Sensing Sensitivity: 2 mV
Zone Setting Status: 755011
Zone Setting Status: 755011

## 2024-03-08 NOTE — Progress Notes (Signed)
 Remote PPM Transmission

## 2024-03-11 ENCOUNTER — Ambulatory Visit: Payer: Self-pay | Admitting: Cardiovascular Disease

## 2024-03-11 DIAGNOSIS — H401132 Primary open-angle glaucoma, bilateral, moderate stage: Secondary | ICD-10-CM | POA: Diagnosis not present

## 2024-03-11 NOTE — Progress Notes (Signed)
 Remote PPM Transmission

## 2024-04-02 ENCOUNTER — Ambulatory Visit
Admission: RE | Admit: 2024-04-02 | Discharge: 2024-04-02 | Disposition: A | Discharge status: Home / Self Care | Source: Ambulatory Visit | Attending: Internal Medicine | Admitting: Internal Medicine

## 2024-04-02 DIAGNOSIS — Z1231 Encounter for screening mammogram for malignant neoplasm of breast: Secondary | ICD-10-CM

## 2024-04-15 ENCOUNTER — Ambulatory Visit: Admitting: Gastroenterology

## 2024-04-15 ENCOUNTER — Encounter: Payer: Self-pay | Admitting: Gastroenterology

## 2024-04-15 VITALS — BP 120/76 | HR 84 | Temp 97.9°F | Ht 64.0 in | Wt 197.0 lb

## 2024-04-15 DIAGNOSIS — K219 Gastro-esophageal reflux disease without esophagitis: Secondary | ICD-10-CM | POA: Diagnosis not present

## 2024-04-15 DIAGNOSIS — K5900 Constipation, unspecified: Secondary | ICD-10-CM

## 2024-04-15 NOTE — Progress Notes (Signed)
 GI Office Note    Referring Provider: Rosamond Leta NOVAK, MD Primary Care Physician:  Rosamond Leta NOVAK, MD  Primary Gastroenterologist: Carlin POUR. Cindie, DO   Chief Complaint   Chief Complaint  Patient presents with   Follow-up    Doing well, no issues.    History of Present Illness   Sylvia Anthony is a 68 y.o. female presenting today for 1 year follow-up.  She has a history of advanced colon polyps remotely.  History of GERD.  Discussed the use of AI scribe software for clinical note transcription with the patient, who gave verbal consent to proceed.  History of Present Illness Sylvia Anthony is a 68 year old female with GERD and intermittent constipation who presents for follow-up.  She is doing well overall with bowel movements occurring approximately every other day. If she does not have a bowel movement for three to four days, she experiences bloating. In such cases, she drinks 'smooth move tea,' which results in a bowel movement the same day. No blood in the stool or melena is reported, and she has no abdominal pain. Her weight is stable.  Her acid reflux is well controlled with pantoprazole , which she takes once daily. Tries to avoid trigger foods. No dysphagia or vomiting.     Prior Data     Results   Colonoscopy March 20, 2023: - Hemorrhoids found on perianal exam - Nonbleeding internal hemorrhoids - Diverticulosis in the sigmoid colon and descending colon - Repeat colonoscopy in 5 years for surveillance   EGD May 2023: -normal esophagus s/p dilation for h/o dysphagia  -Small hiatal hernia. - Gastritis. Biopsied. - Normal duodenal bulb, first portion of the duodenum and second portion of the duodenum  FINAL MICROSCOPIC DIAGNOSIS:   A. STOMACH, BIOPSY:  -  2 fragments of antralized oxyntic mucosa with intestinal metaplasia  suggestive of atrophy.  -  Separate fragments of oxyntic mucosa with mild chronic inactive  gastritis.  -H. pylori  immunohistochemistry is NEGATIVE for microorganisms.    Colonoscopy 11/2017: -diverticulosis -external hemorrhoids -five year surveillance colonoscopy due to personal history of polyp with high grade dysplasia    Medications   Current Outpatient Medications  Medication Sig Dispense Refill   albuterol (PROVENTIL HFA;VENTOLIN HFA) 108 (90 BASE) MCG/ACT inhaler Inhale 2 puffs into the lungs every 6 (six) hours as needed for wheezing or shortness of breath.     amLODipine  (NORVASC ) 10 MG tablet Take 10 mg by mouth daily.     aspirin  EC 81 MG tablet Take 1 tablet (81 mg total) by mouth daily. 90 tablet 3   brimonidine  (ALPHAGAN ) 0.2 % ophthalmic solution Place 1 drop into both eyes 2 (two) times daily.     carvedilol  (COREG ) 6.25 MG tablet TAKE 1 TABLET(6.25 MG) BY MOUTH TWICE DAILY 180 tablet 3   cetirizine (ZYRTEC) 10 MG tablet Take 10 mg by mouth at bedtime as needed for allergies.      COSOPT  22.3-6.8 MG/ML ophthalmic solution Place 1 drop into both eyes 2 (two) times daily.     diclofenac (VOLTAREN) 75 MG EC tablet Take 75 mg by mouth daily as needed for mild pain.     dorzolamide  (TRUSOPT ) 2 % ophthalmic solution Place 1 drop into both eyes 3 (three) times daily.     Evolocumab  (REPATHA  SURECLICK) 140 MG/ML SOAJ Inject 140 mg into the skin every 14 (fourteen) days. 2 mL 11   ibuprofen  (ADVIL ) 200 MG tablet Take 400 mg by  mouth every 8 (eight) hours as needed (pain.).     levothyroxine (SYNTHROID, LEVOTHROID) 112 MCG tablet Take 112 mcg by mouth daily before breakfast.      nitroGLYCERIN  (NITROSTAT ) 0.4 MG SL tablet Place 1 tablet (0.4 mg total) under the tongue every 5 (five) minutes as needed for chest pain. 25 tablet 3   pantoprazole  (PROTONIX ) 40 MG tablet Take 1 tablet (40 mg total) by mouth 2 (two) times daily before a meal. 60 tablet 5   potassium chloride SA (K-DUR,KLOR-CON) 20 MEQ tablet Take 20 mEq by mouth in the morning.     predniSONE (DELTASONE) 10 MG tablet Take 10 mg by  mouth 2 (two) times daily.     RHOPRESSA 0.02 % SOLN Apply to eye.     valsartan-hydrochlorothiazide (DIOVAN-HCT) 320-25 MG per tablet Take 1 tablet by mouth in the morning.     No current facility-administered medications for this visit.    Allergies   Allergies as of 04/15/2024 - Review Complete 04/15/2024  Allergen Reaction Noted   Crestor [rosuvastatin] Swelling and Other (See Comments) 10/15/2021   Neurontin [gabapentin] Swelling 12/23/2016     Review of Systems   General: Negative for anorexia, weight loss, fever, chills, fatigue, weakness. ENT: Negative for hoarseness, difficulty swallowing , nasal congestion. CV: Negative for chest pain, angina, palpitations, dyspnea on exertion, peripheral edema.  Respiratory: Negative for dyspnea at rest, dyspnea on exertion, cough, sputum, wheezing.  GI: See history of present illness. GU:  Negative for dysuria, hematuria, urinary incontinence, urinary frequency, nocturnal urination.  Endo: Negative for unusual weight change.     Physical Exam   BP 120/76   Pulse 84   Temp 97.9 F (36.6 C) (Oral)   Ht 5' 4 (1.626 m)   Wt 197 lb (89.4 kg)   SpO2 93%   BMI 33.81 kg/m    General: Well-nourished, well-developed in no acute distress.  Eyes: No icterus. Mouth: Oropharyngeal mucosa moist and pink   Abdomen: Bowel sounds are normal, nontender, nondistended, no hepatosplenomegaly or masses,  no abdominal bruits or hernia , no rebound or guarding.  Rectal: not performed Extremities: No lower extremity edema. No clubbing or deformities. Neuro: Alert and oriented x 4   Skin: Warm and dry, no jaundice.   Psych: Alert and cooperative, normal mood and affect.  Labs   None available   Imaging Studies   MM 3D SCREENING MAMMOGRAM BILATERAL BREAST Result Date: 04/05/2024 CLINICAL DATA:  Screening. EXAM: DIGITAL SCREENING BILATERAL MAMMOGRAM WITH TOMOSYNTHESIS AND CAD TECHNIQUE: Bilateral screening digital craniocaudal and mediolateral  oblique mammograms were obtained. Bilateral screening digital breast tomosynthesis was performed. The images were evaluated with computer-aided detection. COMPARISON:  Previous exam(s). ACR Breast Density Category b: There are scattered areas of fibroglandular density. FINDINGS: There are no findings suspicious for malignancy. IMPRESSION: No mammographic evidence of malignancy. A result letter of this screening mammogram will be mailed directly to the patient. RECOMMENDATION: Screening mammogram in one year. (Code:SM-B-01Y) BI-RADS CATEGORY  1: Negative. Electronically Signed   By: Rosina Gelineau M.D.   On: 04/05/2024 15:11    Assessment/Plan:    Assessment & Plan Gastroesophageal reflux disease (GERD) without esophagitis GERD well controlled with pantoprazole . - Continue pantoprazole  once daily. - Continue to avoid trigger foods. - Return ov in one year.  Chronic constipation Intermittent constipation managed with smooth move tea. - Continue smooth move tea as needed. - Drink adequate water  daily, at least 64 ounces - Consume 5 servings of fruit/vegetables daily.  Sonny RAMAN. Ezzard, MHS, PA-C [facility]

## 2024-04-15 NOTE — Patient Instructions (Addendum)
 Continue pantoprazole  40mg  daily before breakfast, may take second dose in evening if needed. Continue to avoid trigger foods for your acid reflux. For constipation, make sure you are consuming at least 5 servings of fruit and vegetables daily. Drink at least 64 ounces of fluid daily. You can continue smooth move tea as needed for management of constipation. Return to the office in one year or sooner if needed.

## 2024-04-17 ENCOUNTER — Other Ambulatory Visit: Payer: Self-pay

## 2024-04-17 ENCOUNTER — Other Ambulatory Visit (HOSPITAL_COMMUNITY): Payer: Self-pay

## 2024-04-17 ENCOUNTER — Other Ambulatory Visit: Payer: Self-pay | Admitting: Cardiology

## 2024-04-17 DIAGNOSIS — E785 Hyperlipidemia, unspecified: Secondary | ICD-10-CM

## 2024-04-17 MED ORDER — REPATHA SURECLICK 140 MG/ML ~~LOC~~ SOAJ
1.0000 mL | SUBCUTANEOUS | 11 refills | Status: AC
  Filled 2024-04-17: qty 2, 28d supply, fill #0
  Filled 2024-05-15: qty 2, 28d supply, fill #1
  Filled 2024-06-12: qty 2, 28d supply, fill #2
  Filled 2024-07-03: qty 2, 28d supply, fill #3

## 2024-05-13 ENCOUNTER — Other Ambulatory Visit: Payer: Self-pay

## 2024-05-13 ENCOUNTER — Emergency Department (HOSPITAL_COMMUNITY)
Admission: EM | Admit: 2024-05-13 | Discharge: 2024-05-13 | Disposition: A | Discharge status: Home / Self Care | Attending: Emergency Medicine | Admitting: Emergency Medicine

## 2024-05-13 ENCOUNTER — Encounter (HOSPITAL_COMMUNITY): Payer: Self-pay

## 2024-05-13 ENCOUNTER — Emergency Department (HOSPITAL_COMMUNITY)

## 2024-05-13 DIAGNOSIS — X58XXXA Exposure to other specified factors, initial encounter: Secondary | ICD-10-CM | POA: Insufficient documentation

## 2024-05-13 DIAGNOSIS — S39012A Strain of muscle, fascia and tendon of lower back, initial encounter: Secondary | ICD-10-CM | POA: Insufficient documentation

## 2024-05-13 DIAGNOSIS — Z79899 Other long term (current) drug therapy: Secondary | ICD-10-CM | POA: Diagnosis not present

## 2024-05-13 DIAGNOSIS — Z7982 Long term (current) use of aspirin: Secondary | ICD-10-CM | POA: Insufficient documentation

## 2024-05-13 DIAGNOSIS — Z950 Presence of cardiac pacemaker: Secondary | ICD-10-CM | POA: Insufficient documentation

## 2024-05-13 DIAGNOSIS — S3992XA Unspecified injury of lower back, initial encounter: Secondary | ICD-10-CM | POA: Diagnosis present

## 2024-05-13 DIAGNOSIS — I10 Essential (primary) hypertension: Secondary | ICD-10-CM | POA: Insufficient documentation

## 2024-05-13 DIAGNOSIS — E039 Hypothyroidism, unspecified: Secondary | ICD-10-CM | POA: Diagnosis not present

## 2024-05-13 LAB — URINALYSIS, ROUTINE W REFLEX MICROSCOPIC
Bilirubin Urine: NEGATIVE
Glucose, UA: NEGATIVE mg/dL
Ketones, ur: NEGATIVE mg/dL
Leukocytes,Ua: NEGATIVE
Nitrite: NEGATIVE
Protein, ur: NEGATIVE mg/dL
Specific Gravity, Urine: <1.005 — ABNORMAL LOW (ref 1.005–1.030)
pH: 6 (ref 5.0–8.0)

## 2024-05-13 LAB — URINALYSIS, MICROSCOPIC (REFLEX)

## 2024-05-13 LAB — CBC WITH DIFFERENTIAL/PLATELET
Abs Immature Granulocytes: 0.02 K/uL (ref 0.00–0.07)
Basophils Absolute: 0 K/uL (ref 0.0–0.1)
Basophils Relative: 1 %
Eosinophils Absolute: 0.1 K/uL (ref 0.0–0.5)
Eosinophils Relative: 1 %
HCT: 45 % (ref 36.0–46.0)
Hemoglobin: 14.4 g/dL (ref 12.0–15.0)
Immature Granulocytes: 0 %
Lymphocytes Relative: 19 %
Lymphs Abs: 1.2 K/uL (ref 0.7–4.0)
MCH: 27.6 pg (ref 26.0–34.0)
MCHC: 32 g/dL (ref 30.0–36.0)
MCV: 86.4 fL (ref 80.0–100.0)
Monocytes Absolute: 0.6 K/uL (ref 0.1–1.0)
Monocytes Relative: 9 %
Neutro Abs: 4.4 K/uL (ref 1.7–7.7)
Neutrophils Relative %: 70 %
Platelets: 242 K/uL (ref 150–400)
RBC: 5.21 MIL/uL — ABNORMAL HIGH (ref 3.87–5.11)
RDW: 13.3 % (ref 11.5–15.5)
WBC: 6.4 K/uL (ref 4.0–10.5)
nRBC: 0 % (ref 0.0–0.2)

## 2024-05-13 LAB — BASIC METABOLIC PANEL WITH GFR
Anion gap: 11 (ref 5–15)
BUN: 5 mg/dL — ABNORMAL LOW (ref 8–23)
CO2: 25 mmol/L (ref 22–32)
Calcium: 9.2 mg/dL (ref 8.9–10.3)
Chloride: 107 mmol/L (ref 98–111)
Creatinine, Ser: 0.67 mg/dL (ref 0.44–1.00)
GFR, Estimated: >60 mL/min (ref >60)
Glucose, Bld: 98 mg/dL (ref 70–99)
Potassium: 3.3 mmol/L — ABNORMAL LOW (ref 3.5–5.1)
Sodium: 143 mmol/L (ref 135–145)

## 2024-05-13 MED ORDER — DIAZEPAM 5 MG/ML IJ SOLN
2.5000 mg | Freq: Once | INTRAMUSCULAR | Status: AC
  Administered 2024-05-13: 13:00:00 2.5 mg via INTRAVENOUS
  Filled 2024-05-13: qty 2

## 2024-05-13 MED ORDER — DIAZEPAM 5 MG/ML IJ SOLN
5.0000 mg | Freq: Once | INTRAMUSCULAR | Status: AC
  Administered 2024-05-13: 10:00:00 5 mg via INTRAVENOUS
  Filled 2024-05-13: qty 2

## 2024-05-13 MED ORDER — MORPHINE SULFATE (PF) 4 MG/ML IV SOLN
4.0000 mg | Freq: Once | INTRAVENOUS | Status: AC
  Administered 2024-05-13: 09:00:00 4 mg via INTRAVENOUS
  Filled 2024-05-13: qty 1

## 2024-05-13 MED ORDER — HYDROCODONE-ACETAMINOPHEN 5-325 MG PO TABS: 1.0000 | ORAL_TABLET | ORAL | 0 refills | Status: AC | PRN

## 2024-05-13 MED ORDER — ONDANSETRON HCL 4 MG/2ML IJ SOLN
4.0000 mg | Freq: Once | INTRAMUSCULAR | Status: AC
  Administered 2024-05-13: 09:00:00 4 mg via INTRAVENOUS
  Filled 2024-05-13: qty 2

## 2024-05-13 MED ORDER — DIAZEPAM 5 MG PO TABS: 5.0000 mg | ORAL_TABLET | Freq: Two times a day (BID) | ORAL | 0 refills | Status: AC | PRN

## 2024-05-13 NOTE — ED Triage Notes (Signed)
 Pt came in pov from home with complaints of lower back pain that started yesterday after standing up to go to church. Pt is normally able to walk alone and cannot do that currently. No urinary problems.

## 2024-05-13 NOTE — ED Provider Notes (Signed)
 [place] EMERGENCY DEPARTMENT AT [facility] Provider Note   CSN: [id number] Arrival date & time: 05/13/24  9245     Patient presents with: Back Pain   Sylvia Anthony is a 68 y.o. female.   Pt is a 68 yo female with pmhx significant for htn, hld, hypothyroidism, anemia, fibromyalgia, GERD, sick sinus syn (s/p pacemaker placement), and MVP.  Pt presents to the ED today with back pain.  She said she was standing up to get ready for church yesterday and she felt a bad pain in her low back.  She hurts to move and to walk.  No numbness.  No weakness.  No bladder incontinence.       Prior to Admission medications  Medication Sig Start Date End Date Taking? Authorizing Provider  diazepam  (VALIUM ) 5 MG tablet Take 1 tablet (5 mg total) by mouth 2 (two) times daily as needed for muscle spasms. 05/13/24  Yes Dean Clarity, MD  HYDROcodone -acetaminophen  (NORCO/VICODIN) 5-325 MG tablet Take 1 tablet by mouth every 4 (four) hours as needed. 05/13/24  Yes Jerlene Rockers, MD  albuterol (PROVENTIL HFA;VENTOLIN HFA) 108 (90 BASE) MCG/ACT inhaler Inhale 2 puffs into the lungs every 6 (six) hours as needed for wheezing or shortness of breath.    [provider]  amLODipine  (NORVASC ) 10 MG tablet Take 10 mg by mouth daily. 08/13/21   [provider]  aspirin  EC 81 MG tablet Take 1 tablet (81 mg total) by mouth daily. 06/22/18   Seiler, Amber K, NP  brimonidine  (ALPHAGAN ) 0.2 % ophthalmic solution Place 1 drop into both eyes 2 (two) times daily. 06/01/21   [provider]  carvedilol  (COREG ) 6.25 MG tablet TAKE 1 TABLET(6.25 MG) BY MOUTH TWICE DAILY 08/04/23   Dick, Ernest H Jr., NP  cetirizine (ZYRTEC) 10 MG tablet Take 10 mg by mouth at bedtime as needed for allergies.     [provider]  COSOPT  22.3-6.8 MG/ML ophthalmic solution Place 1 drop into both eyes 2 (two) times daily. 06/01/21   [provider]  diclofenac (VOLTAREN) 75 MG EC tablet  Take 75 mg by mouth daily as needed for mild pain.    [provider]  dorzolamide  (TRUSOPT ) 2 % ophthalmic solution Place 1 drop into both eyes 3 (three) times daily. 04/12/24   [provider]  Evolocumab  (REPATHA  SURECLICK) 140 MG/ML SOAJ Inject 140 mg into the skin every 14 (fourteen) days. 04/17/24   Jeffrie Oneil BROCKS, MD  ibuprofen  (ADVIL ) 200 MG tablet Take 400 mg by mouth every 8 (eight) hours as needed (pain.).    [provider]  levothyroxine (SYNTHROID, LEVOTHROID) 112 MCG tablet Take 112 mcg by mouth daily before breakfast.  12/12/17   [provider]  nitroGLYCERIN  (NITROSTAT ) 0.4 MG SL tablet Place 1 tablet (0.4 mg total) under the tongue every 5 (five) minutes as needed for chest pain. 07/18/23   Jeffrie Oneil BROCKS, MD  pantoprazole  (PROTONIX ) 40 MG tablet Take 1 tablet (40 mg total) by mouth 2 (two) times daily before a meal. 09/03/14   Ezzard Sonny RAMAN, PA-C  potassium chloride SA (K-DUR,KLOR-CON) 20 MEQ tablet Take 20 mEq by mouth in the morning.    [provider]  predniSONE (DELTASONE) 10 MG tablet Take 10 mg by mouth 2 (two) times daily. 04/09/24   [provider]  RHOPRESSA 0.02 % SOLN Apply to eye. 04/12/24   [provider]  valsartan-hydrochlorothiazide (DIOVAN-HCT) 320-25 MG per tablet Take 1 tablet by  mouth in the morning.    [provider]    Allergies: Crestor [rosuvastatin] and Neurontin [gabapentin]    Review of Systems  Musculoskeletal:  Positive for back pain.  All other systems reviewed and are negative.   Updated Vital Signs BP (!) 147/82   Pulse 60   Temp 98.7 F (37.1 C) (Oral)   Resp 17   Ht 5' 4 (1.626 m)   Wt 88.5 kg   SpO2 94%   BMI 33.47 kg/m   Physical Exam Vitals and nursing note reviewed.  Constitutional:      Appearance: Normal appearance. She is obese.  HENT:     Head: Normocephalic and atraumatic.     Right Ear: External ear normal.     Left Ear: External ear normal.      Nose: Nose normal.     Mouth/Throat:     Mouth: Mucous membranes are moist.     Pharynx: Oropharynx is clear.  Eyes:     Extraocular Movements: Extraocular movements intact.     Conjunctiva/sclera: Conjunctivae normal.     Pupils: Pupils are equal, round, and reactive to light.  Cardiovascular:     Rate and Rhythm: Normal rate and regular rhythm.     Pulses: Normal pulses.     Heart sounds: Normal heart sounds.  Pulmonary:     Effort: Pulmonary effort is normal.     Breath sounds: Normal breath sounds.  Abdominal:     General: Abdomen is flat. Bowel sounds are normal.     Palpations: Abdomen is soft.  Musculoskeletal:       Arms:     Cervical back: Normal range of motion and neck supple.  Skin:    General: Skin is warm.     Capillary Refill: Capillary refill takes less than 2 seconds.  Neurological:     General: No focal deficit present.     Mental Status: She is alert and oriented to person, place, and time.  Psychiatric:        Mood and Affect: Mood normal.        Behavior: Behavior normal.     (all labs ordered are listed, but only abnormal results are displayed) Labs Reviewed  BASIC METABOLIC PANEL WITH GFR - Abnormal; Notable for the following components:      Result Value   Potassium 3.3 (*)    BUN 5 (*)    All other components within normal limits  CBC WITH DIFFERENTIAL/PLATELET - Abnormal; Notable for the following components:   RBC 5.21 (*)    All other components within normal limits  URINALYSIS, ROUTINE W REFLEX MICROSCOPIC - Abnormal; Notable for the following components:   Specific Gravity, Urine <1.005 (*)    Hgb urine dipstick SMALL (*)    All other components within normal limits  URINALYSIS, MICROSCOPIC (REFLEX) - Abnormal; Notable for the following components:   Bacteria, UA RARE (*)    All other components within normal limits    EKG: None  Radiology: DG Lumbar Spine Complete Result Date: 05/13/2024 CLINICAL DATA:  Back pain. EXAM:  LUMBAR SPINE - COMPLETE 4+ VIEW COMPARISON:  CT abdomen pelvis dated 09/12/2022. FINDINGS: Five lumbar type vertebra. For there is no acute fracture or subluxation of the lumbar spine. The vertebral body heights are maintained. Mild lower lumbar facet arthropathy. The visualized posterior elements are intact. Atherosclerotic calcification of the abdominal aorta. Right upper quadrant cholecystectomy clips. The soft tissues are unremarkable. IMPRESSION: 1. No acute findings. 2. Mild lower  lumbar facet arthropathy. Electronically Signed   By: Vanetta Chou M.D.   On: 05/13/2024 09:56     Procedures   Medications Ordered in the ED  morphine  (PF) 4 MG/ML injection 4 mg (4 mg Intravenous Given 05/13/24 0846)  ondansetron  (ZOFRAN ) injection 4 mg (4 mg Intravenous Given 05/13/24 0846)  diazepam  (VALIUM ) injection 5 mg (5 mg Intravenous Given 05/13/24 1013)  diazepam  (VALIUM ) injection 2.5 mg (2.5 mg Intravenous Given 05/13/24 1257)                                    Medical Decision Making Amount and/or Complexity of Data Reviewed Labs: ordered. Radiology: ordered.  Risk Prescription drug management.   This patient presents to the ED for concern of lbp, this involves an extensive number of treatment options, and is a complaint that carries with it a high risk of complications and morbidity.  The differential diagnosis includes msk, sciatica, uti, kidney stone   Co morbidities that complicate the patient evaluation   htn, hld, hypothyroidism, anemia, fibromyalgia, GERD, sick sinus syn (s/p pacemaker placement), and MVP   Additional history obtained:  Additional history obtained from epic chart review External records from outside source obtained and reviewed including family   Lab Tests:  I Ordered, and personally interpreted labs.  The pertinent results include:  cbc nl, bmp nl other than k sl low at 3.3, ua neg for uti   Imaging Studies ordered:  I ordered imaging studies  including lumbar spine  I independently visualized and interpreted imaging which showed  No acute findings.  2. Mild lower lumbar facet arthropathy.   I agree with the radiologist interpretation   Medicines ordered and prescription drug management:  I ordered medication including morphine /valium   for sx  Reevaluation of the patient after these medicines showed that the patient improved I have reviewed the patients home medicines and have made adjustments as needed   Test Considered:  ct   Critical Interventions:  Pain control   Problem List / ED Course:  Lumbar strain:  pt feels much better.  She is moving much easier.  I suspect muscle strain.  She knows to return if worse.  F/u with pcp.   Reevaluation:  After the interventions noted above, I reevaluated the patient and found that they have :improved   Social Determinants of Health:  Lives at home   Dispostion:  After consideration of the diagnostic results and the patients response to treatment, I feel that the patent would benefit from discharge with outpatient f/u.       Final diagnoses:  Strain of lumbar region, initial encounter    ED Discharge Orders          Ordered    HYDROcodone -acetaminophen  (NORCO/VICODIN) 5-325 MG tablet  Every 4 hours PRN        05/13/24 1315    diazepam  (VALIUM ) 5 MG tablet  2 times daily PRN        05/13/24 1315               Camil Wilhelmsen, MD 05/13/24 1428

## 2024-05-31 NOTE — Progress Notes (Shared)
 " [facility] - Clinic Note  06/11/2024    CHIEF COMPLAINT Patient presents for No chief complaint on file.  HISTORY OF PRESENT ILLNESS: Sylvia Anthony is a 69 y.o. female who presents to the clinic today for:     Patient states she had pink eye a couple weeks ago, she states it had cleared up and Friday at work she saw a solid red line on her right eye, she states her eyes feel like pressure and achy like she has a sinus infection, she is using brimonidine  TID, Cosopt  BID, timolol  once a day and Rhopressa at night, she states it seems like the drops irritate her eyes, she states they turn red when she puts the drops in them, she did not see Dr. Fleeta when she had pink eye, she went to her family dr  Referring physician: Rosamond Leta KATHEE, MD [address] [place],  [place] [address]  HISTORICAL INFORMATION:   Selected notes from the MEDICAL RECORD NUMBER Referred by Dr. Nicholaus for concern of bilateral vitritis.   CURRENT MEDICATIONS: Current Outpatient Medications (Ophthalmic Drugs)  Medication Sig   brimonidine  (ALPHAGAN ) 0.2 % ophthalmic solution Place 1 drop into both eyes 2 (two) times daily.   COSOPT  22.3-6.8 MG/ML ophthalmic solution Place 1 drop into both eyes 2 (two) times daily.   dorzolamide  (TRUSOPT ) 2 % ophthalmic solution Place 1 drop into both eyes 3 (three) times daily.   RHOPRESSA 0.02 % SOLN Apply to eye.   No current facility-administered medications for this visit. (Ophthalmic Drugs)   Current Outpatient Medications (Other)  Medication Sig   albuterol (PROVENTIL HFA;VENTOLIN HFA) 108 (90 BASE) MCG/ACT inhaler Inhale 2 puffs into the lungs every 6 (six) hours as needed for wheezing or shortness of breath.   amLODipine  (NORVASC ) 10 MG tablet Take 10 mg by mouth daily.   aspirin  EC 81 MG tablet Take 1 tablet (81 mg total) by mouth daily.   carvedilol  (COREG ) 6.25 MG tablet TAKE 1 TABLET(6.25 MG) BY MOUTH TWICE DAILY   cetirizine (ZYRTEC) 10 MG tablet  Take 10 mg by mouth at bedtime as needed for allergies.    diazepam  (VALIUM ) 5 MG tablet Take 1 tablet (5 mg total) by mouth 2 (two) times daily as needed for muscle spasms.   diclofenac (VOLTAREN) 75 MG EC tablet Take 75 mg by mouth daily as needed for mild pain.   Evolocumab  (REPATHA  SURECLICK) 140 MG/ML SOAJ Inject 140 mg into the skin every 14 (fourteen) days.   HYDROcodone -acetaminophen  (NORCO/VICODIN) 5-325 MG tablet Take 1 tablet by mouth every 4 (four) hours as needed.   ibuprofen  (ADVIL ) 200 MG tablet Take 400 mg by mouth every 8 (eight) hours as needed (pain.).   levothyroxine (SYNTHROID, LEVOTHROID) 112 MCG tablet Take 112 mcg by mouth daily before breakfast.    nitroGLYCERIN  (NITROSTAT ) 0.4 MG SL tablet Place 1 tablet (0.4 mg total) under the tongue every 5 (five) minutes as needed for chest pain.   pantoprazole  (PROTONIX ) 40 MG tablet Take 1 tablet (40 mg total) by mouth 2 (two) times daily before a meal.   potassium chloride SA (K-DUR,KLOR-CON) 20 MEQ tablet Take 20 mEq by mouth in the morning.   predniSONE (DELTASONE) 10 MG tablet Take 10 mg by mouth 2 (two) times daily.   valsartan-hydrochlorothiazide (DIOVAN-HCT) 320-25 MG per tablet Take 1 tablet by mouth in the morning.   No current facility-administered medications for this visit. (Other)   REVIEW OF SYSTEMS:   ALLERGIES  Allergies  Allergen Reactions   Crestor [Rosuvastatin] Swelling and Other (See Comments)    Muscle aches/inflammation   Neurontin [Gabapentin] Swelling    Disoriented/out of it   PAST MEDICAL HISTORY Past Medical History:  Diagnosis Date   Anemia    Arm numbness    Cataract    Mixed form OU   Fibromyalgia    GERD (gastroesophageal reflux disease)    Glucose intolerance (impaired glucose tolerance)    H. pylori infection 01/2012   Amoxicillin, Biaxin   Hyperlipidemia    Hypertension    Hypertensive retinopathy    OU   Hypothyroidism    Mitral valve prolapse    not seen on recent echoes    Presence of permanent cardiac pacemaker    Rheumatic fever    age 64, was at [facility] for 17 weeks   SSS (sick sinus syndrome) (HCC)    1997 ppm, gen change 2020 (MDT)   Past Surgical History:  Procedure Laterality Date   ABDOMINAL HYSTERECTOMY     partial   APPENDECTOMY     BALLOON DILATION N/A 10/18/2021   Procedure: BALLOON DILATION;  Surgeon: Cindie Carlin POUR, DO;  [facility];  Service: Endoscopy;  Laterality: N/A;   BIOPSY  10/18/2021   Procedure: BIOPSY;  Surgeon: Cindie Carlin POUR, DO;  [facility];  Service: Endoscopy;;   CARDIAC CATHETERIZATION     multiple   CHOLECYSTECTOMY     COLONOSCOPY  08/01/2006   3 mm descending colon polyp removed/8 mm sessile ascending colon polyp removed / 3-mm rectal  polyp removed /Rare sigmoid diverticulosis/ Moderate internal hemorrhoids.Advanced adenoma on colonoscopy in March 2008.  The polyp was anadenomatous polyp with a foci of high-grade dysplasia   COLONOSCOPY  09/08/2008   SIMPLE ADENOMA/HYPERPLASTIC POLY/Multiple colon polyps (ascending, sigmoid, rectal)  Mild sigmoid colon diverticulosis./ Small internal hemorrhoids   COLONOSCOPY N/A 12/11/2017   Dr. Harvey: diverticulosis, hemorrhoids next surveillance tcs 5 years.    COLONOSCOPY WITH ESOPHAGOGASTRODUODENOSCOPY (EGD)  Sept. 30, 2013   DOQ:Fpoi diverticulosis was noted in the sigmoid colon/The colon was otherwise normal/Small internal hemorrhoids/EGD:The mucosa of the esophagus appeared normal/Non-erosive gastritis (inflammation) was found; multiple bx/The duodenal mucosa showed no abnormalities. +H.pylori gastritis, treated with equivalent of prevpac. SAVARY DILATION   COLONOSCOPY WITH PROPOFOL  N/A 03/20/2023   Procedure: COLONOSCOPY WITH PROPOFOL ;  Surgeon: Cindie Carlin POUR, DO;  [facility];  Service: Endoscopy;  Laterality: N/A;  10:30 am, asa 3   ESOPHAGEAL DILATION N/A 10/07/2014   Procedure: ESOPHAGEAL DILATION;  Surgeon: Margo LITTIE Harvey,  MD;  [facility];  Service: Endoscopy;  Laterality: N/A;   ESOPHAGOGASTRODUODENOSCOPY N/A 10/07/2014   Dr. Harvey: moderate non-erosive gastritis, no definite stricture, empiric dilation . negative H.pylori    ESOPHAGOGASTRODUODENOSCOPY (EGD) WITH PROPOFOL  N/A 10/18/2021   Procedure: ESOPHAGOGASTRODUODENOSCOPY (EGD) WITH PROPOFOL ;  Surgeon: Cindie Carlin POUR, DO;  [facility];  Service: Endoscopy;  Laterality: N/A;  2:30pm   growth removed from intestine     [facility]-as teenager, done through colonoscopy   PACEMAKER GENERATOR CHANGE  09/18/2006   generator change by Dr Malva with a MDT Adapta L   PACEMAKER INSERTION  09/13/1995   for sick sinus syndome and syncope at [facility] GENERATOR CHANGEOUT N/A 06/11/2018    Medtronic Azure XT DR MRI SureScan model [id number] (serial number [id number]) pacemaker by Dr Kelsie   right Achilles tendon     X 2  TONSILLECTOMY     FAMILY HISTORY Family History  Problem Relation Age of Onset   Hyperlipidemia Mother    Hypertension Mother    Heart disease Father    Hypertension Father    Breast cancer Sister    Hyperlipidemia Sister    Hypertension Sister    Colon polyps Sister    Hyperlipidemia Brother    Hypertension Brother    Colon polyps Brother    Colon cancer Neg Hx    SOCIAL HISTORY Social History   Tobacco Use   Smoking status: Former    Current packs/day: 0.00    Average packs/day: 0.3 packs/day for 4.0 years (1.0 ttl pk-yrs)    Types: Cigarettes    Start date: 10/09/1981    Quit date: 10/09/1985    Years since quitting: 38.6   Smokeless tobacco: Never   Tobacco comments:    just in teens  Vaping Use   Vaping status: Never Used  Substance Use Topics   Alcohol use: No    Alcohol/week: 0.0 standard drinks of alcohol   Drug use: No       OPHTHALMIC EXAM:  Not recorded    IMAGING AND PROCEDURES  Imaging and Procedures for @TODAY @          ASSESSMENT/PLAN:  No diagnosis  found.  1-3. Mild Panuveitis w/ CME OU  - s/p STK OD #1 (05.25.21).  - s/p STK OS #1 (10.10.23) - episode of recurrent CME OS likely related to cataract sx (April/May 2023); OD without CME  - delayed to follow up from 10.11.21 to 11.29.22 - at initial presentation, pt reported 1+ mo history of floaters and decreased vision OU; +photophobia  - saw Dr. Nicholaus, who noted [facility] cell and started PF q1h - initial exam here showed +cell/pigment in [facility] and vitreous cavity; mild vitreous haze and +central CME OU - FA (02.15.21) showed central petaloid hyperfluorescence and hyperfluorescence of the optic disc  - pt reports history of fibromyalgia, family history of lupus  - initiated uveitis lab work up -- all WNL except +toxo IgG   CBC, CMP   RPR, VDRL, FTA-Abs, MHA   HIV, Lyme, Quant-Gold   Toxoplasma titers   HLA Panel   ANA   ANCA   ACE, Lysozyme   RF   ESR, CRP   CXR - chest x-ray without TB or pulmonary sarcoidosis, but did reveal a 1.6cm nodular mass in right lung base  - hx of Covid-19 infection ?involvement - repeat FA (08.23.21) showed interval improvement in perifoveal petaloid leakage OU  - OCT shows stable resolution of IRF/CME and SRF  - discussed findings  - off PredForte due to history of steroid response  - off Prolensa  qdaily OS -- okay to continue holding  - f/u 6-9 months, sooner prn -- DFE/OCT  4,5. Hypertensive retinopathy OU  - discussed importance of tight BP control  - monitor  6. Pseudophakia OU  - s/p CE/IOL OU (Dr. Fleeta, April and May 2023)  - IOLs in good position, doing well  - monitor  7. 1.6 cm lung nodule of R lung base found on CXR - following with Pulmonology -- lesion shrinking and no interventions have been recommended - PET imaging done on 3.11.21 -- identifying some metabolic lesions (see report below) - CT chest performed 4.14.21 -- indicating mild dec in some dimensions (see report below) - pt saw Dr. Shelah who had originally scheduled a  bronchoscopy, but was cancelled following the CT evidence of decreasing size on  CT  - nodule continues to decrease in size on imaging  8,9. 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 Fleeta  - IOP 14,12 -- suspect steroid response -- discontinued PF as above  - +family history  - C/D increased OU -- OD 0.75; OS 0.65 - reviewed instructions: Cosopt  BID OU and Brimonidine  BID OU  - s/p SLT OS (03.25.24 -- Dr. Fleeta)   10. Ptosis OS  - under the expert management of Dr. Fleeta  - referred for surgical consult to [facility]   Ophthalmic Meds Ordered this visit:  No orders of the defined types were placed in this encounter.    No follow-ups on file.  There are no Patient Instructions on file for this visit.  Explained the diagnoses, plan, and follow up with the patient and they expressed understanding.  Patient expressed understanding of the importance of proper follow up care.   This document serves as a record of services personally performed by Redell JUDITHANN Hans, MD, PhD. It was created on their behalf by Wanda GEANNIE Keens, COT an ophthalmic technician. The creation of this record is the provider's dictation and/or activities during the visit.    Electronically signed by:  Wanda GEANNIE Keens, COT  05/31/2024 8:30 AM   Redell JUDITHANN Hans, M.D., Ph.D. Diseases & Surgery of the Retina and Vitreous [facility] 06/11/2024   Abbreviations: M myopia (nearsighted); A astigmatism; H hyperopia (farsighted); P presbyopia; Mrx spectacle prescription;  CTL contact lenses; OD right eye; OS left eye; OU both eyes  XT exotropia; ET esotropia; PEK punctate epithelial keratitis; PEE punctate epithelial erosions; DES dry eye syndrome; MGD meibomian gland dysfunction; ATs artificial tears; PFAT's preservative free artificial tears; NSC nuclear sclerotic cataract; PSC posterior subcapsular cataract; ERM epi-retinal membrane; PVD posterior vitreous  detachment; RD retinal detachment; DM diabetes mellitus; DR diabetic retinopathy; NPDR non-proliferative diabetic retinopathy; PDR proliferative diabetic retinopathy; CSME clinically significant macular edema; DME diabetic macular edema; dbh dot blot hemorrhages; CWS cotton wool spot; POAG primary open angle glaucoma; C/D cup-to-disc ratio; HVF humphrey visual field; GVF goldmann visual field; OCT optical coherence tomography; IOP intraocular pressure; BRVO Branch retinal vein occlusion; CRVO central retinal vein occlusion; CRAO central retinal artery occlusion; BRAO branch retinal artery occlusion; RT retinal tear; SB scleral buckle; PPV pars plana vitrectomy; VH Vitreous hemorrhage; PRP panretinal laser photocoagulation; IVK intravitreal kenalog ; VMT vitreomacular traction; MH Macular hole;  NVD neovascularization of the disc; NVE neovascularization elsewhere; AREDS age related eye disease study; ARMD age related macular degeneration; POAG primary open angle glaucoma; EBMD epithelial/anterior basement membrane dystrophy; ACIOL anterior chamber intraocular lens; IOL intraocular lens; PCIOL posterior chamber intraocular lens; Phaco/IOL phacoemulsification with intraocular lens placement; PRK photorefractive keratectomy; LASIK laser assisted in situ keratomileusis; HTN hypertension; DM diabetes mellitus; COPD chronic obstructive pulmonary disease  "

## 2024-06-05 ENCOUNTER — Ambulatory Visit: Payer: Medicare Other

## 2024-06-05 DIAGNOSIS — I495 Sick sinus syndrome: Secondary | ICD-10-CM

## 2024-06-06 LAB — CUP PACEART REMOTE DEVICE CHECK
Battery Remaining Longevity: 107 mo
Battery Voltage: 3 V
Brady Statistic AP VP Percent: 0.04 %
Brady Statistic AP VS Percent: 78.38 %
Brady Statistic AS VP Percent: 0.01 %
Brady Statistic AS VS Percent: 21.57 %
Brady Statistic RA Percent Paced: 78.92 %
Brady Statistic RV Percent Paced: 0.04 %
Date Time Interrogation Session: 20260106221937
Implantable Lead Connection Status: 753985
Implantable Lead Connection Status: 753985
Implantable Lead Implant Date: 19970416
Implantable Lead Implant Date: 19970416
Implantable Lead Location: 753859
Implantable Lead Location: 753860
Implantable Lead Model: 5034
Implantable Lead Model: 5534
Implantable Pulse Generator Implant Date: 20200113
Lead Channel Impedance Value: 646 Ohm
Lead Channel Impedance Value: 722 Ohm
Lead Channel Impedance Value: 760 Ohm
Lead Channel Impedance Value: 779 Ohm
Lead Channel Pacing Threshold Amplitude: 0.5 V
Lead Channel Pacing Threshold Amplitude: 0.875 V
Lead Channel Pacing Threshold Pulse Width: 0.4 ms
Lead Channel Pacing Threshold Pulse Width: 0.4 ms
Lead Channel Sensing Intrinsic Amplitude: 2.25 mV
Lead Channel Sensing Intrinsic Amplitude: 2.25 mV
Lead Channel Sensing Intrinsic Amplitude: 20.75 mV
Lead Channel Sensing Intrinsic Amplitude: 20.75 mV
Lead Channel Setting Pacing Amplitude: 1.5 V
Lead Channel Setting Pacing Amplitude: 2.5 V
Lead Channel Setting Pacing Pulse Width: 0.4 ms
Lead Channel Setting Sensing Sensitivity: 2 mV
Zone Setting Status: 755011
Zone Setting Status: 755011

## 2024-06-08 ENCOUNTER — Ambulatory Visit: Payer: Self-pay | Admitting: Cardiovascular Disease

## 2024-06-10 NOTE — Progress Notes (Signed)
 Remote PPM Transmission

## 2024-06-11 ENCOUNTER — Encounter (INDEPENDENT_AMBULATORY_CARE_PROVIDER_SITE_OTHER): Admitting: Ophthalmology

## 2024-06-11 DIAGNOSIS — H44113 Panuveitis, bilateral: Secondary | ICD-10-CM

## 2024-06-11 DIAGNOSIS — H35033 Hypertensive retinopathy, bilateral: Secondary | ICD-10-CM

## 2024-06-11 DIAGNOSIS — R911 Solitary pulmonary nodule: Secondary | ICD-10-CM

## 2024-06-11 DIAGNOSIS — I10 Essential (primary) hypertension: Secondary | ICD-10-CM

## 2024-06-11 DIAGNOSIS — H02402 Unspecified ptosis of left eyelid: Secondary | ICD-10-CM

## 2024-06-11 DIAGNOSIS — H35353 Cystoid macular degeneration, bilateral: Secondary | ICD-10-CM

## 2024-06-11 DIAGNOSIS — H209 Unspecified iridocyclitis: Secondary | ICD-10-CM

## 2024-06-11 DIAGNOSIS — Z961 Presence of intraocular lens: Secondary | ICD-10-CM

## 2024-06-11 DIAGNOSIS — H40003 Preglaucoma, unspecified, bilateral: Secondary | ICD-10-CM

## 2024-06-11 DIAGNOSIS — H40053 Ocular hypertension, bilateral: Secondary | ICD-10-CM

## 2024-06-11 NOTE — Progress Notes (Shared)
 " [facility] - Clinic Note  06/25/2024    CHIEF COMPLAINT Patient presents for No chief complaint on file.  HISTORY OF PRESENT ILLNESS: Sylvia Anthony is a 69 y.o. female who presents to the clinic today for:     Patient states she had pink eye a couple weeks ago, she states it had cleared up and Friday at work she saw a solid red line on her right eye, she states her eyes feel like pressure and achy like she has a sinus infection, she is using brimonidine  TID, Cosopt  BID, timolol  once a day and Rhopressa at night, she states it seems like the drops irritate her eyes, she states they turn red when she puts the drops in them, she did not see Dr. Fleeta when she had pink eye, she went to her family dr  Referring physician: Rosamond Leta KATHEE, MD [address] [place],  [place] [address]  HISTORICAL INFORMATION:   Selected notes from the MEDICAL RECORD NUMBER Referred by Dr. Nicholaus for concern of bilateral vitritis.   CURRENT MEDICATIONS: Current Outpatient Medications (Ophthalmic Drugs)  Medication Sig   brimonidine  (ALPHAGAN ) 0.2 % ophthalmic solution Place 1 drop into both eyes 2 (two) times daily.   COSOPT  22.3-6.8 MG/ML ophthalmic solution Place 1 drop into both eyes 2 (two) times daily.   dorzolamide  (TRUSOPT ) 2 % ophthalmic solution Place 1 drop into both eyes 3 (three) times daily.   RHOPRESSA 0.02 % SOLN Apply to eye.   No current facility-administered medications for this visit. (Ophthalmic Drugs)   Current Outpatient Medications (Other)  Medication Sig   albuterol (PROVENTIL HFA;VENTOLIN HFA) 108 (90 BASE) MCG/ACT inhaler Inhale 2 puffs into the lungs every 6 (six) hours as needed for wheezing or shortness of breath.   amLODipine  (NORVASC ) 10 MG tablet Take 10 mg by mouth daily.   aspirin  EC 81 MG tablet Take 1 tablet (81 mg total) by mouth daily.   carvedilol  (COREG ) 6.25 MG tablet TAKE 1 TABLET(6.25 MG) BY MOUTH TWICE DAILY   cetirizine (ZYRTEC) 10 MG tablet  Take 10 mg by mouth at bedtime as needed for allergies.    diazepam  (VALIUM ) 5 MG tablet Take 1 tablet (5 mg total) by mouth 2 (two) times daily as needed for muscle spasms.   diclofenac (VOLTAREN) 75 MG EC tablet Take 75 mg by mouth daily as needed for mild pain.   Evolocumab  (REPATHA  SURECLICK) 140 MG/ML SOAJ Inject 140 mg into the skin every 14 (fourteen) days.   HYDROcodone -acetaminophen  (NORCO/VICODIN) 5-325 MG tablet Take 1 tablet by mouth every 4 (four) hours as needed.   ibuprofen  (ADVIL ) 200 MG tablet Take 400 mg by mouth every 8 (eight) hours as needed (pain.).   levothyroxine (SYNTHROID, LEVOTHROID) 112 MCG tablet Take 112 mcg by mouth daily before breakfast.    nitroGLYCERIN  (NITROSTAT ) 0.4 MG SL tablet Place 1 tablet (0.4 mg total) under the tongue every 5 (five) minutes as needed for chest pain.   pantoprazole  (PROTONIX ) 40 MG tablet Take 1 tablet (40 mg total) by mouth 2 (two) times daily before a meal.   potassium chloride SA (K-DUR,KLOR-CON) 20 MEQ tablet Take 20 mEq by mouth in the morning.   predniSONE (DELTASONE) 10 MG tablet Take 10 mg by mouth 2 (two) times daily.   valsartan-hydrochlorothiazide (DIOVAN-HCT) 320-25 MG per tablet Take 1 tablet by mouth in the morning.   No current facility-administered medications for this visit. (Other)   REVIEW OF SYSTEMS:   ALLERGIES  Allergies  Allergen Reactions   Crestor [Rosuvastatin] Swelling and Other (See Comments)    Muscle aches/inflammation   Neurontin [Gabapentin] Swelling    Disoriented/out of it   PAST MEDICAL HISTORY Past Medical History:  Diagnosis Date   Anemia    Arm numbness    Cataract    Mixed form OU   Fibromyalgia    GERD (gastroesophageal reflux disease)    Glucose intolerance (impaired glucose tolerance)    H. pylori infection 01/2012   Amoxicillin, Biaxin   Hyperlipidemia    Hypertension    Hypertensive retinopathy    OU   Hypothyroidism    Mitral valve prolapse    not seen on recent echoes    Presence of permanent cardiac pacemaker    Rheumatic fever    age 1, was at [facility] for 17 weeks   SSS (sick sinus syndrome) (HCC)    1997 ppm, gen change 2020 (MDT)   Past Surgical History:  Procedure Laterality Date   ABDOMINAL HYSTERECTOMY     partial   APPENDECTOMY     BALLOON DILATION N/A 10/18/2021   Procedure: BALLOON DILATION;  Surgeon: Cindie Carlin POUR, DO;  [facility];  Service: Endoscopy;  Laterality: N/A;   BIOPSY  10/18/2021   Procedure: BIOPSY;  Surgeon: Cindie Carlin POUR, DO;  [facility];  Service: Endoscopy;;   CARDIAC CATHETERIZATION     multiple   CHOLECYSTECTOMY     COLONOSCOPY  08/01/2006   3 mm descending colon polyp removed/8 mm sessile ascending colon polyp removed / 3-mm rectal  polyp removed /Rare sigmoid diverticulosis/ Moderate internal hemorrhoids.Advanced adenoma on colonoscopy in March 2008.  The polyp was anadenomatous polyp with a foci of high-grade dysplasia   COLONOSCOPY  09/08/2008   SIMPLE ADENOMA/HYPERPLASTIC POLY/Multiple colon polyps (ascending, sigmoid, rectal)  Mild sigmoid colon diverticulosis./ Small internal hemorrhoids   COLONOSCOPY N/A 12/11/2017   Dr. Harvey: diverticulosis, hemorrhoids next surveillance tcs 5 years.    COLONOSCOPY WITH ESOPHAGOGASTRODUODENOSCOPY (EGD)  Sept. 30, 2013   DOQ:Fpoi diverticulosis was noted in the sigmoid colon/The colon was otherwise normal/Small internal hemorrhoids/EGD:The mucosa of the esophagus appeared normal/Non-erosive gastritis (inflammation) was found; multiple bx/The duodenal mucosa showed no abnormalities. +H.pylori gastritis, treated with equivalent of prevpac. SAVARY DILATION   COLONOSCOPY WITH PROPOFOL  N/A 03/20/2023   Procedure: COLONOSCOPY WITH PROPOFOL ;  Surgeon: Cindie Carlin POUR, DO;  [facility]: [facility];  Service: Endoscopy;  Laterality: N/A;  10:30 am, asa 3   ESOPHAGEAL DILATION N/A 10/07/2014   Procedure: ESOPHAGEAL DILATION;  Surgeon: Margo LITTIE Harvey,  MD;  [facility]: [facility];  Service: Endoscopy;  Laterality: N/A;   ESOPHAGOGASTRODUODENOSCOPY N/A 10/07/2014   Dr. Harvey: moderate non-erosive gastritis, no definite stricture, empiric dilation . negative H.pylori    ESOPHAGOGASTRODUODENOSCOPY (EGD) WITH PROPOFOL  N/A 10/18/2021   Procedure: ESOPHAGOGASTRODUODENOSCOPY (EGD) WITH PROPOFOL ;  Surgeon: Cindie Carlin POUR, DO;  [facility];  Service: Endoscopy;  Laterality: N/A;  2:30pm   growth removed from intestine     [facility]-as teenager, done through colonoscopy   PACEMAKER GENERATOR CHANGE  09/18/2006   generator change by Dr Malva with a MDT Adapta L   PACEMAKER INSERTION  09/13/1995   for sick sinus syndome and syncope at [facility] GENERATOR CHANGEOUT N/A 06/11/2018    Medtronic Azure XT DR MRI SureScan model [id number] (serial number [id number]) pacemaker by Dr Kelsie   right Achilles tendon     X 2  TONSILLECTOMY     FAMILY HISTORY Family History  Problem Relation Age of Onset   Hyperlipidemia Mother    Hypertension Mother    Heart disease Father    Hypertension Father    Breast cancer Sister    Hyperlipidemia Sister    Hypertension Sister    Colon polyps Sister    Hyperlipidemia Brother    Hypertension Brother    Colon polyps Brother    Colon cancer Neg Hx    SOCIAL HISTORY Social History   Tobacco Use   Smoking status: Former    Current packs/day: 0.00    Average packs/day: 0.3 packs/day for 4.0 years (1.0 ttl pk-yrs)    Types: Cigarettes    Start date: 10/09/1981    Quit date: 10/09/1985    Years since quitting: 38.6   Smokeless tobacco: Never   Tobacco comments:    just in teens  Vaping Use   Vaping status: Never Used  Substance Use Topics   Alcohol use: No    Alcohol/week: 0.0 standard drinks of alcohol   Drug use: No       OPHTHALMIC EXAM:  Not recorded    IMAGING AND PROCEDURES  Imaging and Procedures for @TODAY @          ASSESSMENT/PLAN:  No diagnosis  found.  1-3. Mild Panuveitis w/ CME OU  - s/p STK OD #1 (05.25.21).  - s/p STK OS #1 (10.10.23) - episode of recurrent CME OS likely related to cataract sx (April/May 2023); OD without CME  - delayed to follow up from 10.11.21 to 11.29.22 - at initial presentation, pt reported 1+ mo history of floaters and decreased vision OU; +photophobia  - saw Dr. Nicholaus, who noted [facility] cell and started PF q1h - initial exam here showed +cell/pigment in [facility] and vitreous cavity; mild vitreous haze and +central CME OU - FA (02.15.21) showed central petaloid hyperfluorescence and hyperfluorescence of the optic disc  - pt reports history of fibromyalgia, family history of lupus  - initiated uveitis lab work up -- all WNL except +toxo IgG   CBC, CMP   RPR, VDRL, FTA-Abs, MHA   HIV, Lyme, Quant-Gold   Toxoplasma titers   HLA Panel   ANA   ANCA   ACE, Lysozyme   RF   ESR, CRP   CXR - chest x-ray without TB or pulmonary sarcoidosis, but did reveal a 1.6cm nodular mass in right lung base  - hx of Covid-19 infection ?involvement - repeat FA (08.23.21) showed interval improvement in perifoveal petaloid leakage OU  - OCT shows stable resolution of IRF/CME and SRF  - discussed findings  - off PredForte due to history of steroid response  - off Prolensa  qdaily OS -- okay to continue holding  - f/u 6-9 months, sooner prn -- DFE/OCT  4,5. Hypertensive retinopathy OU  - discussed importance of tight BP control  - monitor  6. Pseudophakia OU  - s/p CE/IOL OU (Dr. Fleeta, April and May 2023)  - IOLs in good position, doing well  - monitor  7. 1.6 cm lung nodule of R lung base found on CXR - following with Pulmonology -- lesion shrinking and no interventions have been recommended - PET imaging done on 3.11.21 -- identifying some metabolic lesions (see report below) - CT chest performed 4.14.21 -- indicating mild dec in some dimensions (see report below) - pt saw Dr. Shelah who had originally scheduled a  bronchoscopy, but was cancelled following the CT evidence of decreasing size on  CT  - nodule continues to decrease in size on imaging  8,9. 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 Fleeta  - IOP 14,12 -- suspect steroid response -- discontinued PF as above  - +family history  - C/D increased OU -- OD 0.75; OS 0.65 - reviewed instructions: Cosopt  BID OU and Brimonidine  BID OU  - s/p SLT OS (03.25.24 -- Dr. Fleeta)   10. Ptosis OS  - under the expert management of Dr. Fleeta  - referred for surgical consult to [facility]   Ophthalmic Meds Ordered this visit:  No orders of the defined types were placed in this encounter.    No follow-ups on file.  There are no Patient Instructions on file for this visit.  Explained the diagnoses, plan, and follow up with the patient and they expressed understanding.  Patient expressed understanding of the importance of proper follow up care.   This document serves as a record of services personally performed by Redell JUDITHANN Hans, MD, PhD. It was created on their behalf by Wanda GEANNIE Keens, COT an ophthalmic technician. The creation of this record is the provider's dictation and/or activities during the visit.    Electronically signed by:  Wanda GEANNIE Keens, COT  06/11/2024 7:28 AM   Redell JUDITHANN Hans, M.D., Ph.D. Diseases & Surgery of the Retina and Vitreous [facility] 06/25/2024   Abbreviations: M myopia (nearsighted); A astigmatism; H hyperopia (farsighted); P presbyopia; Mrx spectacle prescription;  CTL contact lenses; OD right eye; OS left eye; OU both eyes  XT exotropia; ET esotropia; PEK punctate epithelial keratitis; PEE punctate epithelial erosions; DES dry eye syndrome; MGD meibomian gland dysfunction; ATs artificial tears; PFAT's preservative free artificial tears; NSC nuclear sclerotic cataract; PSC posterior subcapsular cataract; ERM epi-retinal membrane; PVD posterior vitreous  detachment; RD retinal detachment; DM diabetes mellitus; DR diabetic retinopathy; NPDR non-proliferative diabetic retinopathy; PDR proliferative diabetic retinopathy; CSME clinically significant macular edema; DME diabetic macular edema; dbh dot blot hemorrhages; CWS cotton wool spot; POAG primary open angle glaucoma; C/D cup-to-disc ratio; HVF humphrey visual field; GVF goldmann visual field; OCT optical coherence tomography; IOP intraocular pressure; BRVO Branch retinal vein occlusion; CRVO central retinal vein occlusion; CRAO central retinal artery occlusion; BRAO branch retinal artery occlusion; RT retinal tear; SB scleral buckle; PPV pars plana vitrectomy; VH Vitreous hemorrhage; PRP panretinal laser photocoagulation; IVK intravitreal kenalog ; VMT vitreomacular traction; MH Macular hole;  NVD neovascularization of the disc; NVE neovascularization elsewhere; AREDS age related eye disease study; ARMD age related macular degeneration; POAG primary open angle glaucoma; EBMD epithelial/anterior basement membrane dystrophy; ACIOL anterior chamber intraocular lens; IOL intraocular lens; PCIOL posterior chamber intraocular lens; Phaco/IOL phacoemulsification with intraocular lens placement; PRK photorefractive keratectomy; LASIK laser assisted in situ keratomileusis; HTN hypertension; DM diabetes mellitus; COPD chronic obstructive pulmonary disease  "

## 2024-06-25 ENCOUNTER — Encounter (INDEPENDENT_AMBULATORY_CARE_PROVIDER_SITE_OTHER): Admitting: Ophthalmology

## 2024-06-25 DIAGNOSIS — Z961 Presence of intraocular lens: Secondary | ICD-10-CM

## 2024-06-25 DIAGNOSIS — H35353 Cystoid macular degeneration, bilateral: Secondary | ICD-10-CM

## 2024-06-25 DIAGNOSIS — H35033 Hypertensive retinopathy, bilateral: Secondary | ICD-10-CM

## 2024-06-25 DIAGNOSIS — H209 Unspecified iridocyclitis: Secondary | ICD-10-CM

## 2024-06-25 DIAGNOSIS — I10 Essential (primary) hypertension: Secondary | ICD-10-CM

## 2024-06-25 DIAGNOSIS — H44113 Panuveitis, bilateral: Secondary | ICD-10-CM

## 2024-06-25 NOTE — Progress Notes (Shared)
 " [facility] - Clinic Note  07/01/2024    CHIEF COMPLAINT Patient presents for No chief complaint on file.  HISTORY OF PRESENT ILLNESS: Sylvia Anthony is a 69 y.o. female who presents to the clinic today for:     Patient states she had pink eye a couple weeks ago, she states it had cleared up and Friday at work she saw a solid red line on her right eye, she states her eyes feel like pressure and achy like she has a sinus infection, she is using brimonidine  TID, Cosopt  BID, timolol  once a day and Rhopressa at night, she states it seems like the drops irritate her eyes, she states they turn red when she puts the drops in them, she did not see Dr. Fleeta when she had pink eye, she went to her family dr  Referring physician: Rosamond Leta KATHEE, MD [address] [place],  [place] [address]  HISTORICAL INFORMATION:   Selected notes from the MEDICAL RECORD NUMBER Referred by Dr. Nicholaus for concern of bilateral vitritis.   CURRENT MEDICATIONS: Current Outpatient Medications (Ophthalmic Drugs)  Medication Sig   brimonidine  (ALPHAGAN ) 0.2 % ophthalmic solution Place 1 drop into both eyes 2 (two) times daily.   COSOPT  22.3-6.8 MG/ML ophthalmic solution Place 1 drop into both eyes 2 (two) times daily.   dorzolamide  (TRUSOPT ) 2 % ophthalmic solution Place 1 drop into both eyes 3 (three) times daily.   RHOPRESSA 0.02 % SOLN Apply to eye.   No current facility-administered medications for this visit. (Ophthalmic Drugs)   Current Outpatient Medications (Other)  Medication Sig   albuterol (PROVENTIL HFA;VENTOLIN HFA) 108 (90 BASE) MCG/ACT inhaler Inhale 2 puffs into the lungs every 6 (six) hours as needed for wheezing or shortness of breath.   amLODipine  (NORVASC ) 10 MG tablet Take 10 mg by mouth daily.   aspirin  EC 81 MG tablet Take 1 tablet (81 mg total) by mouth daily.   carvedilol  (COREG ) 6.25 MG tablet TAKE 1 TABLET(6.25 MG) BY MOUTH TWICE DAILY   cetirizine (ZYRTEC) 10 MG tablet Take  10 mg by mouth at bedtime as needed for allergies.    diazepam  (VALIUM ) 5 MG tablet Take 1 tablet (5 mg total) by mouth 2 (two) times daily as needed for muscle spasms.   diclofenac (VOLTAREN) 75 MG EC tablet Take 75 mg by mouth daily as needed for mild pain.   Evolocumab  (REPATHA  SURECLICK) 140 MG/ML SOAJ Inject 140 mg into the skin every 14 (fourteen) days.   HYDROcodone -acetaminophen  (NORCO/VICODIN) 5-325 MG tablet Take 1 tablet by mouth every 4 (four) hours as needed.   ibuprofen  (ADVIL ) 200 MG tablet Take 400 mg by mouth every 8 (eight) hours as needed (pain.).   levothyroxine (SYNTHROID, LEVOTHROID) 112 MCG tablet Take 112 mcg by mouth daily before breakfast.    nitroGLYCERIN  (NITROSTAT ) 0.4 MG SL tablet Place 1 tablet (0.4 mg total) under the tongue every 5 (five) minutes as needed for chest pain.   pantoprazole  (PROTONIX ) 40 MG tablet Take 1 tablet (40 mg total) by mouth 2 (two) times daily before a meal.   potassium chloride SA (K-DUR,KLOR-CON) 20 MEQ tablet Take 20 mEq by mouth in the morning.   predniSONE (DELTASONE) 10 MG tablet Take 10 mg by mouth 2 (two) times daily.   valsartan-hydrochlorothiazide (DIOVAN-HCT) 320-25 MG per tablet Take 1 tablet by mouth in the morning.   No current facility-administered medications for this visit. (Other)   REVIEW OF SYSTEMS:   ALLERGIES  Allergies  Allergen Reactions   Crestor [Rosuvastatin] Swelling and Other (See Comments)    Muscle aches/inflammation   Neurontin [Gabapentin] Swelling    Disoriented/out of it   PAST MEDICAL HISTORY Past Medical History:  Diagnosis Date   Anemia    Arm numbness    Cataract    Mixed form OU   Fibromyalgia    GERD (gastroesophageal reflux disease)    Glucose intolerance (impaired glucose tolerance)    H. pylori infection 01/2012   Amoxicillin, Biaxin   Hyperlipidemia    Hypertension    Hypertensive retinopathy    OU   Hypothyroidism    Mitral valve prolapse    not seen on recent echoes    Presence of permanent cardiac pacemaker    Rheumatic fever    age 23, was at [facility] for 17 weeks   SSS (sick sinus syndrome) (HCC)    1997 ppm, gen change 2020 (MDT)   Past Surgical History:  Procedure Laterality Date   ABDOMINAL HYSTERECTOMY     partial   APPENDECTOMY     BALLOON DILATION N/A 10/18/2021   Procedure: BALLOON DILATION;  Surgeon: Cindie Carlin POUR, DO;  [facility];  Service: Endoscopy;  Laterality: N/A;   BIOPSY  10/18/2021   Procedure: BIOPSY;  Surgeon: Cindie Carlin POUR, DO;  [facility];  Service: Endoscopy;;   CARDIAC CATHETERIZATION     multiple   CHOLECYSTECTOMY     COLONOSCOPY  08/01/2006   3 mm descending colon polyp removed/8 mm sessile ascending colon polyp removed / 3-mm rectal  polyp removed /Rare sigmoid diverticulosis/ Moderate internal hemorrhoids.Advanced adenoma on colonoscopy in March 2008.  The polyp was anadenomatous polyp with a foci of high-grade dysplasia   COLONOSCOPY  09/08/2008   SIMPLE ADENOMA/HYPERPLASTIC POLY/Multiple colon polyps (ascending, sigmoid, rectal)  Mild sigmoid colon diverticulosis./ Small internal hemorrhoids   COLONOSCOPY N/A 12/11/2017   Dr. Harvey: diverticulosis, hemorrhoids next surveillance tcs 5 years.    COLONOSCOPY WITH ESOPHAGOGASTRODUODENOSCOPY (EGD)  Sept. 30, 2013   DOQ:Fpoi diverticulosis was noted in the sigmoid colon/The colon was otherwise normal/Small internal hemorrhoids/EGD:The mucosa of the esophagus appeared normal/Non-erosive gastritis (inflammation) was found; multiple bx/The duodenal mucosa showed no abnormalities. +H.pylori gastritis, treated with equivalent of prevpac. SAVARY DILATION   COLONOSCOPY WITH PROPOFOL  N/A 03/20/2023   Procedure: COLONOSCOPY WITH PROPOFOL ;  Surgeon: Cindie Carlin POUR, DO;  [facility];  Service: Endoscopy;  Laterality: N/A;  10:30 am, asa 3   ESOPHAGEAL DILATION N/A 10/07/2014   Procedure: ESOPHAGEAL DILATION;  Surgeon: Margo LITTIE Harvey,  MD;  [facility]: [facility];  Service: Endoscopy;  Laterality: N/A;   ESOPHAGOGASTRODUODENOSCOPY N/A 10/07/2014   Dr. Harvey: moderate non-erosive gastritis, no definite stricture, empiric dilation . negative H.pylori    ESOPHAGOGASTRODUODENOSCOPY (EGD) WITH PROPOFOL  N/A 10/18/2021   Procedure: ESOPHAGOGASTRODUODENOSCOPY (EGD) WITH PROPOFOL ;  Surgeon: Cindie Carlin POUR, DO;  [facility];  Service: Endoscopy;  Laterality: N/A;  2:30pm   growth removed from intestine     [facility]-as teenager, done through colonoscopy   PACEMAKER GENERATOR CHANGE  09/18/2006   generator change by Dr Malva with a MDT Adapta L   PACEMAKER INSERTION  09/13/1995   for sick sinus syndome and syncope at [facility] GENERATOR CHANGEOUT N/A 06/11/2018    [employer]tronic Azure XT DR MRI SureScan model [id number] (serial number [id number]) pacemaker by Dr Kelsie   right Achilles tendon     X 2  TONSILLECTOMY     FAMILY HISTORY Family History  Problem Relation Age of Onset   Hyperlipidemia Mother    Hypertension Mother    Heart disease Father    Hypertension Father    Breast cancer Sister    Hyperlipidemia Sister    Hypertension Sister    Colon polyps Sister    Hyperlipidemia Brother    Hypertension Brother    Colon polyps Brother    Colon cancer Neg Hx    SOCIAL HISTORY Social History   Tobacco Use   Smoking status: Former    Current packs/day: 0.00    Average packs/day: 0.3 packs/day for 4.0 years (1.0 ttl pk-yrs)    Types: Cigarettes    Start date: 10/09/1981    Quit date: 10/09/1985    Years since quitting: 38.7   Smokeless tobacco: Never   Tobacco comments:    just in teens  Vaping Use   Vaping status: Never Used  Substance Use Topics   Alcohol use: No    Alcohol/week: 0.0 standard drinks of alcohol   Drug use: No       OPHTHALMIC EXAM:  Not recorded    IMAGING AND PROCEDURES  Imaging and Procedures for @TODAY @          ASSESSMENT/PLAN:    ICD-10-CM   1.  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 Cystoid macular edema of both eyes  H35.353     3. Uveitis  H20.9     4. Essential hypertension  I10     5. Hypertensive retinopathy of both eyes  H35.033     6. Pseudophakia, both eyes  Z96.1     7. 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 Glaucoma suspect of both eyes  H40.003     10. Ptosis of left eyelid  H02.402       1-3. Mild Panuveitis w/ CME OU  - s/p STK OD #1 (05.25.21).  - s/p STK OS #1 (10.10.23) - episode of recurrent CME OS likely related to cataract sx (April/May 2023); OD without CME  - delayed to follow up from 10.11.21 to 11.29.22 - at initial presentation, pt reported 1+ mo history of floaters and decreased vision OU; +photophobia  - saw Dr. Nicholaus, who noted [facility] cell and started PF q1h - initial exam here showed +cell/pigment in [facility] and vitreous cavity; mild vitreous haze and +central CME OU - FA (02.15.21) showed central petaloid hyperfluorescence and hyperfluorescence of the optic disc  - pt reports history of fibromyalgia, family history of lupus  - initiated uveitis lab work up -- all WNL except +toxo IgG   CBC, CMP   RPR, VDRL, FTA-Abs, MHA   HIV, Lyme, Quant-Gold   Toxoplasma titers   HLA Panel   ANA   ANCA   ACE, Lysozyme   RF   ESR, CRP   CXR - chest x-ray without TB or pulmonary sarcoidosis, but did reveal a 1.6cm nodular mass in right lung base  - hx of Covid-19 infection ?involvement - repeat FA (08.23.21) showed interval improvement in perifoveal petaloid leakage OU  - OCT shows stable resolution of IRF/CME and SRF  - discussed findings  - off PredForte due to history of steroid response  - off Prolensa  qdaily OS -- okay to continue holding  - f/u 6-9 months, sooner prn -- DFE/OCT  4,5. Hypertensive retinopathy OU  - discussed importance of  tight BP control  - monitor  6. Pseudophakia OU  - s/p CE/IOL OU (Dr. Fleeta, April and May 2023)  - IOLs in good  position, doing well  - monitor  7. 1.6 cm lung nodule of R lung base found on CXR - following with Pulmonology -- lesion shrinking and no interventions have been recommended - PET imaging done on 3.11.21 -- identifying some metabolic lesions (see report below) - CT chest performed 4.14.21 -- indicating mild dec in some dimensions (see report below) - pt saw Dr. Shelah who had originally scheduled a bronchoscopy, but was cancelled following the CT evidence of decreasing size on CT  - nodule continues to decrease in size on imaging  8,9. 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 Fleeta  - IOP 14,12 -- suspect steroid response -- discontinued PF as above  - +family history  - C/D increased OU -- OD 0.75; OS 0.65 - reviewed instructions: Cosopt  BID OU and Brimonidine  BID OU  - s/p SLT OS (03.25.24 -- Dr. Fleeta)   10. Ptosis OS  - under the expert management of Dr. Fleeta  - referred for surgical consult to [facility]   Ophthalmic Meds Ordered this visit:  No orders of the defined types were placed in this encounter.    No follow-ups on file.  There are no Patient Instructions on file for this visit.  Explained the diagnoses, plan, and follow up with the patient and they expressed understanding.  Patient expressed understanding of the importance of proper follow up care.   This document serves as a record of services personally performed by Redell JUDITHANN Hans, MD, PhD. It was created on their behalf by Wanda GEANNIE Keens, COT an ophthalmic technician. The creation of this record is the provider's dictation and/or activities during the visit.    Electronically signed by:  Wanda GEANNIE Keens, COT  06/25/24 10:52 AM  This document serves as a record of services personally performed by Redell JUDITHANN Hans, MD, PhD. It was created on their behalf by Paulina Jamse Gay an ophthalmic technician. The creation of this record is the provider's dictation and/or  activities during the visit.   Electronically signed by: Paulina JONETTA Gay  06/25/24  10:52 AM    Redell JUDITHANN Hans, M.D., Ph.D. Diseases & Surgery of the Retina and Vitreous [facility] 07/01/2024   Abbreviations: M myopia (nearsighted); A astigmatism; H hyperopia (farsighted); P presbyopia; Mrx spectacle prescription;  CTL contact lenses; OD right eye; OS left eye; OU both eyes  XT exotropia; ET esotropia; PEK punctate epithelial keratitis; PEE punctate epithelial erosions; DES dry eye syndrome; MGD meibomian gland dysfunction; ATs artificial tears; PFAT's preservative free artificial tears; NSC nuclear sclerotic cataract; PSC posterior subcapsular cataract; ERM epi-retinal membrane; PVD posterior vitreous detachment; RD retinal detachment; DM diabetes mellitus; DR diabetic retinopathy; NPDR non-proliferative diabetic retinopathy; PDR proliferative diabetic retinopathy; CSME clinically significant macular edema; DME diabetic macular edema; dbh dot blot hemorrhages; CWS cotton wool spot; POAG primary open angle glaucoma; C/D cup-to-disc ratio; HVF humphrey visual field; GVF goldmann visual field; OCT optical coherence tomography; IOP intraocular pressure; BRVO Branch retinal vein occlusion; CRVO central retinal vein occlusion; CRAO central retinal artery occlusion; BRAO branch retinal artery occlusion; RT retinal tear; SB scleral buckle; PPV pars plana vitrectomy; VH Vitreous hemorrhage; PRP panretinal laser photocoagulation; IVK intravitreal kenalog ; VMT vitreomacular traction; MH Macular hole;  NVD neovascularization of the disc; NVE neovascularization elsewhere; AREDS age  related eye disease study; ARMD age related macular degeneration; POAG primary open angle glaucoma; EBMD epithelial/anterior basement membrane dystrophy; ACIOL anterior chamber intraocular lens; IOL intraocular lens; PCIOL posterior chamber intraocular lens; Phaco/IOL phacoemulsification with intraocular lens  placement; PRK photorefractive keratectomy; LASIK laser assisted in situ keratomileusis; HTN hypertension; DM diabetes mellitus; COPD chronic obstructive pulmonary disease  "

## 2024-07-01 ENCOUNTER — Encounter (INDEPENDENT_AMBULATORY_CARE_PROVIDER_SITE_OTHER): Admitting: Ophthalmology

## 2024-07-01 DIAGNOSIS — H35033 Hypertensive retinopathy, bilateral: Secondary | ICD-10-CM

## 2024-07-01 DIAGNOSIS — H40003 Preglaucoma, unspecified, bilateral: Secondary | ICD-10-CM

## 2024-07-01 DIAGNOSIS — H35353 Cystoid macular degeneration, bilateral: Secondary | ICD-10-CM

## 2024-07-01 DIAGNOSIS — Z961 Presence of intraocular lens: Secondary | ICD-10-CM

## 2024-07-01 DIAGNOSIS — H44113 Panuveitis, bilateral: Secondary | ICD-10-CM

## 2024-07-01 DIAGNOSIS — I10 Essential (primary) hypertension: Secondary | ICD-10-CM

## 2024-07-01 DIAGNOSIS — R911 Solitary pulmonary nodule: Secondary | ICD-10-CM

## 2024-07-01 DIAGNOSIS — H40053 Ocular hypertension, bilateral: Secondary | ICD-10-CM

## 2024-07-01 DIAGNOSIS — H209 Unspecified iridocyclitis: Secondary | ICD-10-CM

## 2024-07-01 DIAGNOSIS — H02402 Unspecified ptosis of left eyelid: Secondary | ICD-10-CM

## 2024-07-02 NOTE — Progress Notes (Shared)
 " [facility] - Clinic Note  07/09/2024    CHIEF COMPLAINT Patient presents for No chief complaint on file.  HISTORY OF PRESENT ILLNESS: Sylvia Anthony is a 69 y.o. female who presents to the clinic today for:     Patient states she had pink eye a couple weeks ago, she states it had cleared up and Friday at work she saw a solid red line on her right eye, she states her eyes feel like pressure and achy like she has a sinus infection, she is using brimonidine  TID, Cosopt  BID, timolol  once a day and Rhopressa at night, she states it seems like the drops irritate her eyes, she states they turn red when she puts the drops in them, she did not see Dr. Fleeta when she had pink eye, she went to her family dr  Referring physician: Rosamond Leta KATHEE, MD [address] [place],  [place] [address]  HISTORICAL INFORMATION:   Selected notes from the MEDICAL RECORD NUMBER Referred by Dr. Nicholaus for concern of bilateral vitritis.   CURRENT MEDICATIONS: Current Outpatient Medications (Ophthalmic Drugs)  Medication Sig   brimonidine  (ALPHAGAN ) 0.2 % ophthalmic solution Place 1 drop into both eyes 2 (two) times daily.   COSOPT  22.3-6.8 MG/ML ophthalmic solution Place 1 drop into both eyes 2 (two) times daily.   dorzolamide  (TRUSOPT ) 2 % ophthalmic solution Place 1 drop into both eyes 3 (three) times daily.   RHOPRESSA 0.02 % SOLN Apply to eye.   No current facility-administered medications for this visit. (Ophthalmic Drugs)   Current Outpatient Medications (Other)  Medication Sig   albuterol (PROVENTIL HFA;VENTOLIN HFA) 108 (90 BASE) MCG/ACT inhaler Inhale 2 puffs into the lungs every 6 (six) hours as needed for wheezing or shortness of breath.   amLODipine  (NORVASC ) 10 MG tablet Take 10 mg by mouth daily.   aspirin  EC 81 MG tablet Take 1 tablet (81 mg total) by mouth daily.   carvedilol  (COREG ) 6.25 MG tablet TAKE 1 TABLET(6.25 MG) BY MOUTH TWICE DAILY   cetirizine (ZYRTEC) 10 MG tablet  Take 10 mg by mouth at bedtime as needed for allergies.    diazepam  (VALIUM ) 5 MG tablet Take 1 tablet (5 mg total) by mouth 2 (two) times daily as needed for muscle spasms.   diclofenac (VOLTAREN) 75 MG EC tablet Take 75 mg by mouth daily as needed for mild pain.   Evolocumab  (REPATHA  SURECLICK) 140 MG/ML SOAJ Inject 140 mg into the skin every 14 (fourteen) days.   HYDROcodone -acetaminophen  (NORCO/VICODIN) 5-325 MG tablet Take 1 tablet by mouth every 4 (four) hours as needed.   ibuprofen  (ADVIL ) 200 MG tablet Take 400 mg by mouth every 8 (eight) hours as needed (pain.).   levothyroxine (SYNTHROID, LEVOTHROID) 112 MCG tablet Take 112 mcg by mouth daily before breakfast.    nitroGLYCERIN  (NITROSTAT ) 0.4 MG SL tablet Place 1 tablet (0.4 mg total) under the tongue every 5 (five) minutes as needed for chest pain.   pantoprazole  (PROTONIX ) 40 MG tablet Take 1 tablet (40 mg total) by mouth 2 (two) times daily before a meal.   potassium chloride SA (K-DUR,KLOR-CON) 20 MEQ tablet Take 20 mEq by mouth in the morning.   predniSONE (DELTASONE) 10 MG tablet Take 10 mg by mouth 2 (two) times daily.   valsartan-hydrochlorothiazide (DIOVAN-HCT) 320-25 MG per tablet Take 1 tablet by mouth in the morning.   No current facility-administered medications for this visit. (Other)   REVIEW OF SYSTEMS:   ALLERGIES  Allergies  Allergen Reactions   Crestor [Rosuvastatin] Swelling and Other (See Comments)    Muscle aches/inflammation   Neurontin [Gabapentin] Swelling    Disoriented/out of it   PAST MEDICAL HISTORY Past Medical History:  Diagnosis Date   Anemia    Arm numbness    Cataract    Mixed form OU   Fibromyalgia    GERD (gastroesophageal reflux disease)    Glucose intolerance (impaired glucose tolerance)    H. pylori infection 01/2012   Amoxicillin, Biaxin   Hyperlipidemia    Hypertension    Hypertensive retinopathy    OU   Hypothyroidism    Mitral valve prolapse    not seen on recent echoes    Presence of permanent cardiac pacemaker    Rheumatic fever    age 22, was at [facility] 17 weeks   SSS (sick sinus syndrome) (HCC)    1997 ppm, gen change 2020 (MDT)   Past Surgical History:  Procedure Laterality Date   ABDOMINAL HYSTERECTOMY     partial   APPENDECTOMY     BALLOON DILATION N/A 10/18/2021   Procedure: BALLOON DILATION;  Surgeon: Cindie Carlin POUR, DO;  [facility];  Service: Endoscopy;  Laterality: N/A;   BIOPSY  10/18/2021   Procedure: BIOPSY;  Surgeon: Cindie Carlin POUR, DO;  [facility];  Service: Endoscopy;;   CARDIAC CATHETERIZATION     multiple   CHOLECYSTECTOMY     COLONOSCOPY  08/01/2006   3 mm descending colon polyp removed/8 mm sessile ascending colon polyp removed / 3-mm rectal  polyp removed /Rare sigmoid diverticulosis/ Moderate internal hemorrhoids.Advanced adenoma on colonoscopy in March 2008.  The polyp was anadenomatous polyp with a foci of high-grade dysplasia   COLONOSCOPY  09/08/2008   SIMPLE ADENOMA/HYPERPLASTIC POLY/Multiple colon polyps (ascending, sigmoid, rectal)  Mild sigmoid colon diverticulosis./ Small internal hemorrhoids   COLONOSCOPY N/A 12/11/2017   Dr. Harvey: diverticulosis, hemorrhoids next surveillance tcs 5 years.    COLONOSCOPY WITH ESOPHAGOGASTRODUODENOSCOPY (EGD)  Sept. 30, 2013   DOQ:Fpoi diverticulosis was noted in the sigmoid colon/The colon was otherwise normal/Small internal hemorrhoids/EGD:The mucosa of the esophagus appeared normal/Non-erosive gastritis (inflammation) was found; multiple bx/The duodenal mucosa showed no abnormalities. +H.pylori gastritis, treated with equivalent of prevpac. SAVARY DILATION   COLONOSCOPY WITH PROPOFOL  N/A 03/20/2023   Procedure: COLONOSCOPY WITH PROPOFOL ;  Surgeon: Cindie Carlin POUR, DO;  [facility];  Service: Endoscopy;  Laterality: N/A;  10:30 am, asa 3   ESOPHAGEAL DILATION N/A 10/07/2014   Procedure: ESOPHAGEAL DILATION;  Surgeon: Margo LITTIE Harvey,  MD;  [facility]: [facility];  Service: Endoscopy;  Laterality: N/A;   ESOPHAGOGASTRODUODENOSCOPY N/A 10/07/2014   Dr. Harvey: moderate non-erosive gastritis, no definite stricture, empiric dilation . negative H.pylori    ESOPHAGOGASTRODUODENOSCOPY (EGD) WITH PROPOFOL  N/A 10/18/2021   Procedure: ESOPHAGOGASTRODUODENOSCOPY (EGD) WITH PROPOFOL ;  Surgeon: Cindie Carlin POUR, DO;  [facility];  Service: Endoscopy;  Laterality: N/A;  2:30pm   growth removed from intestine     [facility]-as teenager, done through colonoscopy   PACEMAKER GENERATOR CHANGE  09/18/2006   generator change by Dr Malva with a MDT Adapta L   PACEMAKER INSERTION  09/13/1995   for sick sinus syndome and syncope at [facility] GENERATOR CHANGEOUT N/A 06/11/2018    [employer]onic Azure XT DR MRI SureScan model [id number] (serial number [id number]) pacemaker by Dr Kelsie   right Achilles tendon     X 2  TONSILLECTOMY     FAMILY HISTORY Family History  Problem Relation Age of Onset   Hyperlipidemia Mother    Hypertension Mother    Heart disease Father    Hypertension Father    Breast cancer Sister    Hyperlipidemia Sister    Hypertension Sister    Colon polyps Sister    Hyperlipidemia Brother    Hypertension Brother    Colon polyps Brother    Colon cancer Neg Hx    SOCIAL HISTORY Social History   Tobacco Use   Smoking status: Former    Current packs/day: 0.00    Average packs/day: 0.3 packs/day for 4.0 years (1.0 ttl pk-yrs)    Types: Cigarettes    Start date: 10/09/1981    Quit date: 10/09/1985    Years since quitting: 38.7   Smokeless tobacco: Never   Tobacco comments:    just in teens  Vaping Use   Vaping status: Never Used  Substance Use Topics   Alcohol use: No    Alcohol/week: 0.0 standard drinks of alcohol   Drug use: No       OPHTHALMIC EXAM:  Not recorded    IMAGING AND PROCEDURES  Imaging and Procedures for @TODAY @          ASSESSMENT/PLAN:    ICD-10-CM   1.  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 Cystoid macular edema of both eyes  H35.353     3. Uveitis  H20.9     4. Essential hypertension  I10     5. Hypertensive retinopathy of both eyes  H35.033     6. Pseudophakia, both eyes  Z96.1       1-3. Mild Panuveitis w/ CME OU  - s/p STK OD #1 (05.25.21).  - s/p STK OS #1 (10.10.23) - episode of recurrent CME OS likely related to cataract sx (April/May 2023); OD without CME  - delayed to follow up from 10.11.21 to 11.29.22 - at initial presentation, pt reported 1+ mo history of floaters and decreased vision OU; +photophobia  - saw Dr. Nicholaus, who noted [facility] cell and started PF q1h - initial exam here showed +cell/pigment in [facility] and vitreous cavity; mild vitreous haze and +central CME OU - FA (02.15.21) showed central petaloid hyperfluorescence and hyperfluorescence of the optic disc  - pt reports history of fibromyalgia, family history of lupus  - initiated uveitis lab work up -- all WNL except +toxo IgG   CBC, CMP   RPR, VDRL, FTA-Abs, MHA   HIV, Lyme, Quant-Gold   Toxoplasma titers   HLA Panel   ANA   ANCA   ACE, Lysozyme   RF   ESR, CRP   CXR - chest x-ray without TB or pulmonary sarcoidosis, but did reveal a 1.6cm nodular mass in right lung base  - hx of Covid-19 infection ?involvement - repeat FA (08.23.21) showed interval improvement in perifoveal petaloid leakage OU  - OCT shows stable resolution of IRF/CME and SRF  - discussed findings  - off PredForte due to history of steroid response  - off Prolensa  qdaily OS -- okay to continue holding  - f/u 6-9 months, sooner prn -- DFE/OCT  4,5. Hypertensive retinopathy OU  - discussed importance of tight BP control  - monitor  6. Pseudophakia OU  - s/p CE/IOL OU (Dr. Fleeta, April and May 2023)  - IOLs in good position, doing well  - monitor  7. 1.6 cm lung nodule of R lung base found on  CXR - following with Pulmonology -- lesion shrinking and no interventions have been  recommended - PET imaging done on 3.11.21 -- identifying some metabolic lesions (see report below) - CT chest performed 4.14.21 -- indicating mild dec in some dimensions (see report below) - pt saw Dr. Shelah who had originally scheduled a bronchoscopy, but was cancelled following the CT evidence of decreasing size on CT  - nodule continues to decrease in size on imaging  8,9. 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 Fleeta  - IOP 14,12 -- suspect steroid response -- discontinued PF as above  - +family history  - C/D increased OU -- OD 0.75; OS 0.65 - reviewed instructions: Cosopt  BID OU and Brimonidine  BID OU  - s/p SLT OS (03.25.24 -- Dr. Fleeta)   10. Ptosis OS  - under the expert management of Dr. Fleeta  - referred for surgical consult to [facility]   Ophthalmic Meds Ordered this visit:  No orders of the defined types were placed in this encounter.    No follow-ups on file.  There are no Patient Instructions on file for this visit.  Explained the diagnoses, plan, and follow up with the patient and they expressed understanding.  Patient expressed understanding of the importance of proper follow up care.   This document serves as a record of services personally performed by Redell JUDITHANN Hans, MD, PhD. It was created on their behalf by Wanda GEANNIE Keens, COT an ophthalmic technician. The creation of this record is the provider's dictation and/or activities during the visit.    Electronically signed by:  Wanda GEANNIE Keens, COT  07/02/24 9:29 AM    Redell JUDITHANN Hans, M.D., Ph.D. Diseases & Surgery of the Retina and Vitreous [facility] 07/09/2024   Abbreviations: M myopia (nearsighted); A astigmatism; H hyperopia (farsighted); P presbyopia; Mrx spectacle prescription;  CTL contact lenses; OD right eye; OS left eye; OU both eyes  XT exotropia; ET esotropia; PEK punctate epithelial keratitis; PEE punctate epithelial  erosions; DES dry eye syndrome; MGD meibomian gland dysfunction; ATs artificial tears; PFAT's preservative free artificial tears; NSC nuclear sclerotic cataract; PSC posterior subcapsular cataract; ERM epi-retinal membrane; PVD posterior vitreous detachment; RD retinal detachment; DM diabetes mellitus; DR diabetic retinopathy; NPDR non-proliferative diabetic retinopathy; PDR proliferative diabetic retinopathy; CSME clinically significant macular edema; DME diabetic macular edema; dbh dot blot hemorrhages; CWS cotton wool spot; POAG primary open angle glaucoma; C/D cup-to-disc ratio; HVF humphrey visual field; GVF goldmann visual field; OCT optical coherence tomography; IOP intraocular pressure; BRVO Branch retinal vein occlusion; CRVO central retinal vein occlusion; CRAO central retinal artery occlusion; BRAO branch retinal artery occlusion; RT retinal tear; SB scleral buckle; PPV pars plana vitrectomy; VH Vitreous hemorrhage; PRP panretinal laser photocoagulation; IVK intravitreal kenalog ; VMT vitreomacular traction; MH Macular hole;  NVD neovascularization of the disc; NVE neovascularization elsewhere; AREDS age related eye disease study; ARMD age related macular degeneration; POAG primary open angle glaucoma; EBMD epithelial/anterior basement membrane dystrophy; ACIOL anterior chamber intraocular lens; IOL intraocular lens; PCIOL posterior chamber intraocular lens; Phaco/IOL phacoemulsification with intraocular lens placement; PRK photorefractive keratectomy; LASIK laser assisted in situ keratomileusis; HTN hypertension; DM diabetes mellitus; COPD chronic obstructive pulmonary disease  "

## 2024-07-09 ENCOUNTER — Encounter (INDEPENDENT_AMBULATORY_CARE_PROVIDER_SITE_OTHER): Admitting: Ophthalmology

## 2024-07-09 DIAGNOSIS — Z961 Presence of intraocular lens: Secondary | ICD-10-CM

## 2024-07-09 DIAGNOSIS — I10 Essential (primary) hypertension: Secondary | ICD-10-CM

## 2024-07-09 DIAGNOSIS — H209 Unspecified iridocyclitis: Secondary | ICD-10-CM

## 2024-07-09 DIAGNOSIS — H35033 Hypertensive retinopathy, bilateral: Secondary | ICD-10-CM

## 2024-07-09 DIAGNOSIS — H44113 Panuveitis, bilateral: Secondary | ICD-10-CM

## 2024-07-09 DIAGNOSIS — H35353 Cystoid macular degeneration, bilateral: Secondary | ICD-10-CM
